# Patient Record
Sex: Male | Born: 1945 | Race: Black or African American | Hispanic: No | Marital: Single | Smoking: Former smoker
Health system: Southern US, Community
[De-identification: ages and names within clinical notes are randomized; demographics above are authoritative.]

## PROBLEM LIST (undated history)

## (undated) DIAGNOSIS — I10 Essential (primary) hypertension: Secondary | ICD-10-CM

## (undated) DIAGNOSIS — Z803 Family history of malignant neoplasm of breast: Secondary | ICD-10-CM

## (undated) DIAGNOSIS — I509 Heart failure, unspecified: Secondary | ICD-10-CM

## (undated) DIAGNOSIS — Z800 Family history of malignant neoplasm of digestive organs: Secondary | ICD-10-CM

## (undated) DIAGNOSIS — I429 Cardiomyopathy, unspecified: Secondary | ICD-10-CM

## (undated) DIAGNOSIS — E119 Type 2 diabetes mellitus without complications: Secondary | ICD-10-CM

## (undated) DIAGNOSIS — E785 Hyperlipidemia, unspecified: Secondary | ICD-10-CM

## (undated) HISTORY — DX: Essential (primary) hypertension: I10

## (undated) HISTORY — PX: CATARACT EXTRACTION W/ INTRAOCULAR LENS  IMPLANT, BILATERAL: SHX1307

## (undated) HISTORY — DX: Heart failure, unspecified: I50.9

## (undated) HISTORY — DX: Cardiomyopathy, unspecified: I42.9

## (undated) HISTORY — DX: Family history of malignant neoplasm of breast: Z80.3

## (undated) HISTORY — PX: HERNIA REPAIR: SHX51

## (undated) HISTORY — DX: Type 2 diabetes mellitus without complications: E11.9

## (undated) HISTORY — DX: Family history of malignant neoplasm of digestive organs: Z80.0

---

## 2005-10-17 ENCOUNTER — Emergency Department: Payer: Self-pay | Admitting: Emergency Medicine

## 2006-02-02 ENCOUNTER — Emergency Department: Payer: Self-pay | Admitting: Emergency Medicine

## 2014-03-14 ENCOUNTER — Emergency Department: Payer: Self-pay | Admitting: Emergency Medicine

## 2014-03-14 LAB — CBC WITH DIFFERENTIAL/PLATELET
BASOS PCT: 1.3 %
Basophil #: 0.1 10*3/uL (ref 0.0–0.1)
Eosinophil #: 0.1 10*3/uL (ref 0.0–0.7)
Eosinophil %: 2.3 %
HCT: 41.8 % (ref 40.0–52.0)
HGB: 13.2 g/dL (ref 13.0–18.0)
Lymphocyte #: 1.8 10*3/uL (ref 1.0–3.6)
Lymphocyte %: 30.6 %
MCH: 29.9 pg (ref 26.0–34.0)
MCHC: 31.5 g/dL — AB (ref 32.0–36.0)
MCV: 95 fL (ref 80–100)
Monocyte #: 0.6 x10 3/mm (ref 0.2–1.0)
Monocyte %: 9.2 %
NEUTROS PCT: 56.6 %
Neutrophil #: 3.4 10*3/uL (ref 1.4–6.5)
PLATELETS: 219 10*3/uL (ref 150–440)
RBC: 4.39 10*6/uL — ABNORMAL LOW (ref 4.40–5.90)
RDW: 14.8 % — AB (ref 11.5–14.5)
WBC: 6 10*3/uL (ref 3.8–10.6)

## 2014-03-14 LAB — COMPREHENSIVE METABOLIC PANEL
ANION GAP: 6 — AB (ref 7–16)
Albumin: 3 g/dL — ABNORMAL LOW (ref 3.4–5.0)
Alkaline Phosphatase: 115 U/L
BILIRUBIN TOTAL: 0.5 mg/dL (ref 0.2–1.0)
BUN: 10 mg/dL (ref 7–18)
Calcium, Total: 8.4 mg/dL — ABNORMAL LOW (ref 8.5–10.1)
Chloride: 108 mmol/L — ABNORMAL HIGH (ref 98–107)
Co2: 27 mmol/L (ref 21–32)
Creatinine: 0.76 mg/dL (ref 0.60–1.30)
GLUCOSE: 232 mg/dL — AB (ref 65–99)
OSMOLALITY: 288 (ref 275–301)
Potassium: 3.9 mmol/L (ref 3.5–5.1)
SGOT(AST): 47 U/L — ABNORMAL HIGH (ref 15–37)
SGPT (ALT): 79 U/L — ABNORMAL HIGH
Sodium: 141 mmol/L (ref 136–145)
TOTAL PROTEIN: 6.8 g/dL (ref 6.4–8.2)

## 2014-03-14 LAB — PRO B NATRIURETIC PEPTIDE: B-Type Natriuretic Peptide: 932 pg/mL — ABNORMAL HIGH (ref 0–125)

## 2014-03-14 LAB — TROPONIN I

## 2014-03-14 LAB — URIC ACID: URIC ACID: 2.8 mg/dL — AB (ref 3.5–7.2)

## 2014-05-02 DIAGNOSIS — E785 Hyperlipidemia, unspecified: Secondary | ICD-10-CM | POA: Insufficient documentation

## 2014-05-02 DIAGNOSIS — N390 Urinary tract infection, site not specified: Secondary | ICD-10-CM | POA: Insufficient documentation

## 2014-05-02 DIAGNOSIS — R0602 Shortness of breath: Secondary | ICD-10-CM | POA: Insufficient documentation

## 2014-05-02 DIAGNOSIS — E119 Type 2 diabetes mellitus without complications: Secondary | ICD-10-CM | POA: Insufficient documentation

## 2014-05-02 DIAGNOSIS — I10 Essential (primary) hypertension: Secondary | ICD-10-CM | POA: Insufficient documentation

## 2014-09-20 DIAGNOSIS — I5043 Acute on chronic combined systolic (congestive) and diastolic (congestive) heart failure: Secondary | ICD-10-CM | POA: Insufficient documentation

## 2014-09-20 DIAGNOSIS — R7401 Elevation of levels of liver transaminase levels: Secondary | ICD-10-CM | POA: Insufficient documentation

## 2014-11-03 DIAGNOSIS — R600 Localized edema: Secondary | ICD-10-CM | POA: Insufficient documentation

## 2014-11-20 ENCOUNTER — Encounter: Payer: Self-pay | Admitting: Family

## 2014-11-20 ENCOUNTER — Ambulatory Visit: Payer: Medicare Other | Attending: Family | Admitting: Family

## 2014-11-20 VITALS — BP 137/99 | HR 82 | Resp 20 | Ht 66.0 in | Wt 207.0 lb

## 2014-11-20 DIAGNOSIS — Z720 Tobacco use: Secondary | ICD-10-CM

## 2014-11-20 DIAGNOSIS — I509 Heart failure, unspecified: Secondary | ICD-10-CM | POA: Insufficient documentation

## 2014-11-20 DIAGNOSIS — I10 Essential (primary) hypertension: Secondary | ICD-10-CM | POA: Diagnosis not present

## 2014-11-20 DIAGNOSIS — E119 Type 2 diabetes mellitus without complications: Secondary | ICD-10-CM | POA: Diagnosis not present

## 2014-11-20 DIAGNOSIS — I429 Cardiomyopathy, unspecified: Secondary | ICD-10-CM | POA: Insufficient documentation

## 2014-11-20 DIAGNOSIS — Z79899 Other long term (current) drug therapy: Secondary | ICD-10-CM | POA: Diagnosis not present

## 2014-11-20 DIAGNOSIS — I5022 Chronic systolic (congestive) heart failure: Secondary | ICD-10-CM | POA: Insufficient documentation

## 2014-11-20 DIAGNOSIS — F17210 Nicotine dependence, cigarettes, uncomplicated: Secondary | ICD-10-CM | POA: Insufficient documentation

## 2014-11-20 DIAGNOSIS — Z7982 Long term (current) use of aspirin: Secondary | ICD-10-CM | POA: Diagnosis not present

## 2014-11-20 NOTE — Progress Notes (Signed)
Subjective:    Patient ID: Matthew Mcgrath, male    DOB: May 20, 1945, 69 y.o.   MRN: [id number]  Congestive Heart Failure Presents for initial visit. The disease course has been stable. Associated symptoms include edema, fatigue, orthopnea (3 pillows) and shortness of breath. Pertinent negatives include no abdominal pain, chest pain, chest pressure, palpitations or paroxysmal nocturnal dyspnea. The symptoms have been stable. Past treatments include beta blockers, ACE inhibitors and salt and fluid restriction. The treatment provided moderate relief. Compliance with prior treatments has been good. His past medical history is significant for DM and HTN. There is no history of CVA. He has one 1st degree relative with heart disease. Compliance with total regimen is 76-100%.  Hypertension This is a chronic problem. The current episode started more than 1 year ago. The problem is unchanged. The problem is controlled. Associated symptoms include malaise/fatigue, peripheral edema and shortness of breath. Pertinent negatives include no anxiety, chest pain, headaches, neck pain or palpitations. There are no associated agents to hypertension. Risk factors for coronary artery disease include diabetes mellitus, male gender, sedentary lifestyle, smoking/tobacco exposure and family history. Past treatments include ACE inhibitors, beta blockers and diuretics. The current treatment provides mild improvement. There are no compliance problems.  Hypertensive end-organ damage includes heart failure. There is no history of kidney disease.    Past Medical History  Diagnosis Date  . Congestive heart failure   . Cardiomyopathy   . HTN (hypertension)   . Diabetes mellitus type 2, controlled     Past Surgical History  Procedure Laterality Date  . Hernia repair      Family History  Problem Relation Age of Onset  . Heart failure Mother   . Heart disease Mother   . Alcoholism Father   . Hypertension Sister   .  Diabetes Sister   . Hypertension Brother   . Diabetes Brother    Social History  Substance Use Topics  . Smoking status: Current Every Day Smoker -- 3.00 packs/day for 15 years    Types: Cigarettes  . Smokeless tobacco: Not on file  . Alcohol Use: No    No Known Allergies  Prior to Admission medications   Medication Sig Start Date End Date Taking? Authorizing Provider  aspirin 81 MG tablet Take 81 mg by mouth daily.   Yes Historical Provider, MD  carvedilol (COREG) 12.5 MG tablet Take 12.5 mg by mouth 2 (two) times daily with a meal.    Yes Historical Provider, MD  furosemide (LASIX) 40 MG tablet Take 40 mg by mouth 2 (two) times daily.   Yes Historical Provider, MD  glipiZIDE (GLUCOTROL) 5 MG tablet Take by mouth daily before breakfast.   Yes Historical Provider, MD  lisinopril (PRINIVIL,ZESTRIL) 20 MG tablet Take 20 mg by mouth daily.   Yes Historical Provider, MD  metFORMIN (GLUCOPHAGE) 500 MG tablet Take by mouth 2 (two) times daily with a meal.   Yes Historical Provider, MD  potassium chloride (K-DUR,KLOR-CON) 10 MEQ tablet Take 10 mEq by mouth daily.   Yes Historical Provider, MD  simvastatin (ZOCOR) 40 MG tablet Take 40 mg by mouth daily.   Yes Historical Provider, MD     Review of Systems  Constitutional: Positive for malaise/fatigue and fatigue. Negative for appetite change.  HENT: Negative for congestion, postnasal drip and sore throat.   Eyes: Negative.   Respiratory: Positive for cough (sometimes) and shortness of breath. Negative for chest tightness.   Cardiovascular: Positive for leg swelling. Negative for  chest pain and palpitations.  Gastrointestinal: Negative for abdominal pain and abdominal distention.  Endocrine: Negative.   Genitourinary: Negative.   Musculoskeletal: Negative for back pain and neck pain.  Skin: Negative.   Allergic/Immunologic: Negative.   Neurological: Negative for dizziness, weakness, light-headedness, numbness and headaches.   Hematological: Negative.   Psychiatric/Behavioral: Positive for sleep disturbance (trouble falling asleep; sleeping on 3 pillows). Negative for dysphoric mood. The patient is not nervous/anxious.        Objective:   Physical Exam  Constitutional: He is oriented to person, place, and time. He appears well-developed and well-nourished.  HENT:  Head: Normocephalic and atraumatic.  Eyes: Conjunctivae are normal. Pupils are equal, round, and reactive to light.  Neck: Normal range of motion. Neck supple.  Cardiovascular: Normal rate and regular rhythm.   Pulmonary/Chest: Effort normal. He has no wheezes. He has no rales.  Abdominal: Soft. He exhibits distension. There is no tenderness.  Musculoskeletal: He exhibits edema (1+ pitting edema in bilateral lower legs). He exhibits no tenderness.  Neurological: He is alert and oriented to person, place, and time.  Skin: Skin is warm and dry.  Psychiatric: He has a normal mood and affect. His behavior is normal. Thought content normal.  Nursing note and vitals reviewed.   BP 137/99 mmHg  Pulse 82  Resp 20  Ht 5\' 6"  (1.676 m)  Wt 207 lb (93.895 kg)  BMI 33.43 kg/m2  SpO2 100%       Assessment & Plan:  1: Chronic heart failure with reduced ejection fraction- Patient presents with fatigue and shortness of breath upon exertion. Also has some swelling in both lower legs although family says that the swelling looks better than previously. When he does get tired or short of breath, he will stop what he's doing to rest until his symptoms resolve. He is already weighing himself daily and reports a stable weight. Instructed to call for an overnight weight gain of >2 pounds or a weekly weight gain of >5 pounds. He does not add salt to his food nor does he cook with it. Discussed the importance of reading food labels and following a 2000mg  sodium diet. Written information regarding a low sodium diet was given to him. He does not elevate his legs much at  home and he was encouraged to elevate them as much as possible when he's home sitting down. [place] pharmacist came in and spoke to patient and family about his medications. Discussed possibly changing his lisinopril to entresto, adding bidil and possibly switching his diuretic to torsemide.  2: HTN- Blood pressure elevated slightly here. Will continue to monitor. 3: Tobacco use- Patient smokes 3-4 ppd of cigarettes and has for many years. Really isn't interested in cessation but it was discussed for 3 minutes. Will continue to address. 4: Diabetes- Currently taking metformin on a daily basis. Unsure of what his glucose is as he doesn't have a machine but says that his doctor tells him it's "good". Encouraged them to speak with his PCP about writing a prescription for a meter and supplies.  Return in 1 month or sooner for any questions/problems before then.

## 2014-11-20 NOTE — Patient Instructions (Signed)
Continue weighing daily and call for an overnight weight gain of > 2 pounds or a weekly weight gain of >5 pounds.    Smoking Cessation Quitting smoking is important to your health and has many advantages. However, it is not always easy to quit since nicotine is a very addictive drug. Oftentimes, people try 3 times or more before being able to quit. This document explains the best ways for you to prepare to quit smoking. Quitting takes hard work and a lot of effort, but you can do it. ADVANTAGES OF QUITTING SMOKING  You will live longer, feel better, and live better.  Your body will feel the impact of quitting smoking almost immediately.  Within 20 minutes, blood pressure decreases. Your pulse returns to its normal level.  After 8 hours, carbon monoxide levels in the blood return to normal. Your oxygen level increases.  After 24 hours, the chance of having a heart attack starts to decrease. Your breath, hair, and body stop smelling like smoke.  After 48 hours, damaged nerve endings begin to recover. Your sense of taste and smell improve.  After 72 hours, the body is virtually free of nicotine. Your bronchial tubes relax and breathing becomes easier.  After 2 to 12 weeks, lungs can hold more air. Exercise becomes easier and circulation improves.  The risk of having a heart attack, stroke, cancer, or lung disease is greatly reduced.  After 1 year, the risk of coronary heart disease is cut in half.  After 5 years, the risk of stroke falls to the same as a nonsmoker.  After 10 years, the risk of lung cancer is cut in half and the risk of other cancers decreases significantly.  After 15 years, the risk of coronary heart disease drops, usually to the level of a nonsmoker.  If you are pregnant, quitting smoking will improve your chances of having a healthy baby.  The people you live with, especially any children, will be healthier.  You will have extra money to spend on things other  than cigarettes. QUESTIONS TO THINK ABOUT BEFORE ATTEMPTING TO QUIT You may want to talk about your answers with your health care provider.  Why do you want to quit?  If you tried to quit in the past, what helped and what did not?  What will be the most difficult situations for you after you quit? How will you plan to handle them?  Who can help you through the tough times? Your family? Friends? A health care provider?  What pleasures do you get from smoking? What ways can you still get pleasure if you quit? Here are some questions to ask your health care provider:  How can you help me to be successful at quitting?  What medicine do you think would be best for me and how should I take it?  What should I do if I need more help?  What is smoking withdrawal like? How can I get information on withdrawal? GET READY  Set a quit date.  Change your environment by getting rid of all cigarettes, ashtrays, matches, and lighters in your home, car, or work. Do not let people smoke in your home.  Review your past attempts to quit. Think about what worked and what did not. GET SUPPORT AND ENCOURAGEMENT You have a better chance of being successful if you have help. You can get support in many ways.  Tell your family, friends, and coworkers that you are going to quit and need their support. Ask   them not to smoke around you.  Get individual, group, or telephone counseling and support. Programs are available at local hospitals and health centers. Call your local health department for information about programs in your area.  Spiritual beliefs and practices may help some smokers quit.  Download a "quit meter" on your computer to keep track of quit statistics, such as how long you have gone without smoking, cigarettes not smoked, and money saved.  Get a self-help book about quitting smoking and staying off tobacco. LEARN NEW SKILLS AND BEHAVIORS  Distract yourself from urges to smoke. Talk to  someone, go for a walk, or occupy your time with a task.  Change your normal routine. Take a different route to work. Drink tea instead of coffee. Eat breakfast in a different place.  Reduce your stress. Take a hot bath, exercise, or read a book.  Plan something enjoyable to do every day. Reward yourself for not smoking.  Explore interactive web-based programs that specialize in helping you quit. GET MEDICINE AND USE IT CORRECTLY Medicines can help you stop smoking and decrease the urge to smoke. Combining medicine with the above behavioral methods and support can greatly increase your chances of successfully quitting smoking.  Nicotine replacement therapy helps deliver nicotine to your body without the negative effects and risks of smoking. Nicotine replacement therapy includes nicotine gum, lozenges, inhalers, nasal sprays, and skin patches. Some may be available over-the-counter and others require a prescription.  Antidepressant medicine helps people abstain from smoking, but how this works is unknown. This medicine is available by prescription.  Nicotinic receptor partial agonist medicine simulates the effect of nicotine in your brain. This medicine is available by prescription. Ask your health care provider for advice about which medicines to use and how to use them based on your health history. Your health care provider will tell you what side effects to look out for if you choose to be on a medicine or therapy. Carefully read the information on the package. Do not use any other product containing nicotine while using a nicotine replacement product.  RELAPSE OR DIFFICULT SITUATIONS Most relapses occur within the first 3 months after quitting. Do not be discouraged if you start smoking again. Remember, most people try several times before finally quitting. You may have symptoms of withdrawal because your body is used to nicotine. You may crave cigarettes, be irritable, feel very hungry, cough  often, get headaches, or have difficulty concentrating. The withdrawal symptoms are only temporary. They are strongest when you first quit, but they will go away within 10-14 days. To reduce the chances of relapse, try to:  Avoid drinking alcohol. Drinking lowers your chances of successfully quitting.  Reduce the amount of caffeine you consume. Once you quit smoking, the amount of caffeine in your body increases and can give you symptoms, such as a rapid heartbeat, sweating, and anxiety.  Avoid smokers because they can make you want to smoke.  Do not let weight gain distract you. Many smokers will gain weight when they quit, usually less than 10 pounds. Eat a healthy diet and stay active. You can always lose the weight gained after you quit.  Find ways to improve your mood other than smoking. FOR MORE INFORMATION  www.smokefree.gov  Document Released: 02/15/2001 Document Revised: 07/08/2013 Document Reviewed: 06/02/2011 Exit[employer] Patient Information 2015 [employer]. This information is not intended to replace advice given to you by your health care provider. Make sure you discuss any questions you have with your   health care provider.  

## 2014-11-21 ENCOUNTER — Encounter: Payer: Self-pay | Admitting: Family

## 2014-11-21 DIAGNOSIS — E119 Type 2 diabetes mellitus without complications: Secondary | ICD-10-CM | POA: Insufficient documentation

## 2014-12-11 ENCOUNTER — Other Ambulatory Visit: Payer: Self-pay | Admitting: Geriatric Medicine

## 2014-12-11 DIAGNOSIS — R748 Abnormal levels of other serum enzymes: Secondary | ICD-10-CM

## 2014-12-22 ENCOUNTER — Encounter: Payer: Self-pay | Admitting: Family

## 2014-12-22 ENCOUNTER — Ambulatory Visit: Payer: Medicare Other | Attending: Family | Admitting: Family

## 2014-12-22 VITALS — BP 128/87 | HR 76 | Resp 20 | Ht 66.0 in | Wt 211.0 lb

## 2014-12-22 DIAGNOSIS — E119 Type 2 diabetes mellitus without complications: Secondary | ICD-10-CM | POA: Diagnosis not present

## 2014-12-22 DIAGNOSIS — I10 Essential (primary) hypertension: Secondary | ICD-10-CM | POA: Diagnosis not present

## 2014-12-22 DIAGNOSIS — F17210 Nicotine dependence, cigarettes, uncomplicated: Secondary | ICD-10-CM | POA: Insufficient documentation

## 2014-12-22 DIAGNOSIS — R0602 Shortness of breath: Secondary | ICD-10-CM | POA: Insufficient documentation

## 2014-12-22 DIAGNOSIS — Z7984 Long term (current) use of oral hypoglycemic drugs: Secondary | ICD-10-CM | POA: Insufficient documentation

## 2014-12-22 DIAGNOSIS — I5022 Chronic systolic (congestive) heart failure: Secondary | ICD-10-CM

## 2014-12-22 DIAGNOSIS — R0789 Other chest pain: Secondary | ICD-10-CM

## 2014-12-22 DIAGNOSIS — R079 Chest pain, unspecified: Secondary | ICD-10-CM | POA: Diagnosis not present

## 2014-12-22 DIAGNOSIS — Z7982 Long term (current) use of aspirin: Secondary | ICD-10-CM | POA: Insufficient documentation

## 2014-12-22 DIAGNOSIS — Z79899 Other long term (current) drug therapy: Secondary | ICD-10-CM | POA: Diagnosis not present

## 2014-12-22 DIAGNOSIS — Z720 Tobacco use: Secondary | ICD-10-CM

## 2014-12-22 NOTE — Patient Instructions (Signed)
Continue weighing daily and call for an overnight weight gain of > 2 pounds or a weekly weight gain of >5 pounds.    Smoking Cessation Quitting smoking is important to your health and has many advantages. However, it is not always easy to quit since nicotine is a very addictive drug. Oftentimes, people try 3 times or more before being able to quit. This document explains the best ways for you to prepare to quit smoking. Quitting takes hard work and a lot of effort, but you can do it. ADVANTAGES OF QUITTING SMOKING  You will live longer, feel better, and live better.  Your body will feel the impact of quitting smoking almost immediately.  Within 20 minutes, blood pressure decreases. Your pulse returns to its normal level.  After 8 hours, carbon monoxide levels in the blood return to normal. Your oxygen level increases.  After 24 hours, the chance of having a heart attack starts to decrease. Your breath, hair, and body stop smelling like smoke.  After 48 hours, damaged nerve endings begin to recover. Your sense of taste and smell improve.  After 72 hours, the body is virtually free of nicotine. Your bronchial tubes relax and breathing becomes easier.  After 2 to 12 weeks, lungs can hold more air. Exercise becomes easier and circulation improves.  The risk of having a heart attack, stroke, cancer, or lung disease is greatly reduced.  After 1 year, the risk of coronary heart disease is cut in half.  After 5 years, the risk of stroke falls to the same as a nonsmoker.  After 10 years, the risk of lung cancer is cut in half and the risk of other cancers decreases significantly.  After 15 years, the risk of coronary heart disease drops, usually to the level of a nonsmoker.  If you are pregnant, quitting smoking will improve your chances of having a healthy baby.  The people you live with, especially any children, will be healthier.  You will have extra money to spend on things other  than cigarettes. QUESTIONS TO THINK ABOUT BEFORE ATTEMPTING TO QUIT You may want to talk about your answers with your health care provider.  Why do you want to quit?  If you tried to quit in the past, what helped and what did not?  What will be the most difficult situations for you after you quit? How will you plan to handle them?  Who can help you through the tough times? Your family? Friends? A health care provider?  What pleasures do you get from smoking? What ways can you still get pleasure if you quit? Here are some questions to ask your health care provider:  How can you help me to be successful at quitting?  What medicine do you think would be best for me and how should I take it?  What should I do if I need more help?  What is smoking withdrawal like? How can I get information on withdrawal? GET READY  Set a quit date.  Change your environment by getting rid of all cigarettes, ashtrays, matches, and lighters in your home, car, or work. Do not let people smoke in your home.  Review your past attempts to quit. Think about what worked and what did not. GET SUPPORT AND ENCOURAGEMENT You have a better chance of being successful if you have help. You can get support in many ways.  Tell your family, friends, and coworkers that you are going to quit and need their support. Ask   them not to smoke around you.  Get individual, group, or telephone counseling and support. Programs are available at local hospitals and health centers. Call your local health department for information about programs in your area.  Spiritual beliefs and practices may help some smokers quit.  Download a "quit meter" on your computer to keep track of quit statistics, such as how long you have gone without smoking, cigarettes not smoked, and money saved.  Get a self-help book about quitting smoking and staying off tobacco. LEARN NEW SKILLS AND BEHAVIORS  Distract yourself from urges to smoke. Talk to  someone, go for a walk, or occupy your time with a task.  Change your normal routine. Take a different route to work. Drink tea instead of coffee. Eat breakfast in a different place.  Reduce your stress. Take a hot bath, exercise, or read a book.  Plan something enjoyable to do every day. Reward yourself for not smoking.  Explore interactive web-based programs that specialize in helping you quit. GET MEDICINE AND USE IT CORRECTLY Medicines can help you stop smoking and decrease the urge to smoke. Combining medicine with the above behavioral methods and support can greatly increase your chances of successfully quitting smoking.  Nicotine replacement therapy helps deliver nicotine to your body without the negative effects and risks of smoking. Nicotine replacement therapy includes nicotine gum, lozenges, inhalers, nasal sprays, and skin patches. Some may be available over-the-counter and others require a prescription.  Antidepressant medicine helps people abstain from smoking, but how this works is unknown. This medicine is available by prescription.  Nicotinic receptor partial agonist medicine simulates the effect of nicotine in your brain. This medicine is available by prescription. Ask your health care provider for advice about which medicines to use and how to use them based on your health history. Your health care provider will tell you what side effects to look out for if you choose to be on a medicine or therapy. Carefully read the information on the package. Do not use any other product containing nicotine while using a nicotine replacement product.  RELAPSE OR DIFFICULT SITUATIONS Most relapses occur within the first 3 months after quitting. Do not be discouraged if you start smoking again. Remember, most people try several times before finally quitting. You may have symptoms of withdrawal because your body is used to nicotine. You may crave cigarettes, be irritable, feel very hungry, cough  often, get headaches, or have difficulty concentrating. The withdrawal symptoms are only temporary. They are strongest when you first quit, but they will go away within 10-14 days. To reduce the chances of relapse, try to:  Avoid drinking alcohol. Drinking lowers your chances of successfully quitting.  Reduce the amount of caffeine you consume. Once you quit smoking, the amount of caffeine in your body increases and can give you symptoms, such as a rapid heartbeat, sweating, and anxiety.  Avoid smokers because they can make you want to smoke.  Do not let weight gain distract you. Many smokers will gain weight when they quit, usually less than 10 pounds. Eat a healthy diet and stay active. You can always lose the weight gained after you quit.  Find ways to improve your mood other than smoking. FOR MORE INFORMATION  www.smokefree.gov  Document Released: 02/15/2001 Document Revised: 07/08/2013 Document Reviewed: 06/02/2011 Exit[employer] Patient Information 2015 [employer]. This information is not intended to replace advice given to you by your health care provider. Make sure you discuss any questions you have with your   health care provider.  

## 2014-12-22 NOTE — Progress Notes (Signed)
Subjective:    Patient ID: Matthew Mcgrath, male    DOB: 01-29-1946, 69 y.o.   MRN: [id number]  Congestive Heart Failure Presents for follow-up visit. The disease course has been stable. Associated symptoms include chest pressure, edema, fatigue, orthopnea, palpitations and shortness of breath (at times). Pertinent negatives include no abdominal pain, chest pain or paroxysmal nocturnal dyspnea. The symptoms have been stable. Past treatments include beta blockers, salt and fluid restriction and ACE inhibitors. The treatment provided moderate relief. Compliance with prior treatments has been variable. Prior compliance problems include difficulty understanding directions. His past medical history is significant for DM and HTN. He has one 1st degree relative with heart disease. Compliance with total regimen is 76-100%.  Chest Pain  This is a recurrent problem. The current episode started more than 1 month ago. The onset quality is gradual. The problem occurs intermittently. The pain is present in the substernal region. The pain is at a severity of 2/10. The pain is mild. The quality of the pain is described as dull. The pain does not radiate. Associated symptoms include a cough (sometimes), palpitations, shortness of breath (at times) and weakness (in legs). Pertinent negatives include no abdominal pain, back pain, dizziness or headaches. The pain is aggravated by nothing. He has tried rest for the symptoms. The treatment provided significant relief. Risk factors include male gender and smoking/tobacco exposure.  His past medical history is significant for CHF, diabetes, DM, HTN and hypertension.  His family medical history is significant for diabetes, heart disease and hypertension.    Past Medical History  Diagnosis Date  . Congestive heart failure ([place])   . Cardiomyopathy ([place])   . HTN (hypertension)   . Diabetes mellitus type 2, controlled [facility])     Past Surgical History  Procedure Laterality  Date  . Hernia repair      Family History  Problem Relation Age of Onset  . Heart failure Mother   . Heart disease Mother   . Alcoholism Father   . Hypertension Sister   . Diabetes Sister   . Hypertension Brother   . Diabetes Brother     Social History  Substance Use Topics  . Smoking status: Current Every Day Smoker -- 3.00 packs/day for 15 years    Types: Cigarettes  . Smokeless tobacco: Never Used  . Alcohol Use: No    No Known Allergies  Prior to Admission medications   Medication Sig Start Date End Date Taking? Authorizing Provider  aspirin 81 MG tablet Take 81 mg by mouth daily.   Yes Historical Provider, MD  carvedilol (COREG) 12.5 MG tablet Take 25 mg by mouth 2 (two) times daily with a meal.    Yes Historical Provider, MD  furosemide (LASIX) 40 MG tablet Take 40 mg by mouth 2 (two) times daily.   Yes Historical Provider, MD  glipiZIDE (GLUCOTROL) 5 MG tablet Take by mouth daily before breakfast.   Yes Historical Provider, MD  lisinopril (PRINIVIL,ZESTRIL) 20 MG tablet Take 20 mg by mouth daily.   Yes Historical Provider, MD  metFORMIN (GLUCOPHAGE) 500 MG tablet Take by mouth 2 (two) times daily with a meal.   Yes Historical Provider, MD  potassium chloride (K-DUR,KLOR-CON) 10 MEQ tablet Take 10 mEq by mouth daily.   Yes Historical Provider, MD  simvastatin (ZOCOR) 40 MG tablet Take 40 mg by mouth daily.   Yes Historical Provider, MD     Review of Systems  Constitutional: Positive for fatigue. Negative for appetite change.  HENT: Negative for congestion, postnasal drip and sore throat.   Eyes: Negative.   Respiratory: Positive for cough (sometimes), chest tightness (lower sternal area last night) and shortness of breath (at times).   Cardiovascular: Positive for palpitations and leg swelling. Negative for chest pain.  Gastrointestinal: Positive for abdominal distention. Negative for abdominal pain.  Endocrine: Negative.   Genitourinary: Negative.    Musculoskeletal: Negative for back pain and neck pain.  Skin: Negative.   Allergic/Immunologic: Negative.   Neurological: Positive for weakness (in legs). Negative for dizziness, light-headedness and headaches.  Hematological: Negative for adenopathy. Does not bruise/bleed easily.  Psychiatric/Behavioral: Positive for sleep disturbance (sleeping on 3 pillows; sleeping more during the day). Negative for dysphoric mood. The patient is not nervous/anxious.        Objective:   Physical Exam  Constitutional: He is oriented to person, place, and time. He appears well-developed and well-nourished.  HENT:  Head: Normocephalic and atraumatic.  Eyes: Conjunctivae are normal. Pupils are equal, round, and reactive to light.  Neck: Normal range of motion. Neck supple.  Cardiovascular: Normal rate and regular rhythm.   Pulmonary/Chest: Effort normal. He has no wheezes. He has no rales.  Abdominal: Soft. He exhibits distension. There is no tenderness.  Musculoskeletal: He exhibits edema (1+ bilateral lower legs). He exhibits no tenderness.  Neurological: He is alert and oriented to person, place, and time.  Skin: Skin is warm and dry.  Psychiatric: He has a normal mood and affect. His behavior is normal. Thought content normal.  Nursing note and vitals reviewed.  BP 128/87 mmHg  Pulse 76  Resp 20  Ht 5\' 6"  (1.676 m)  Wt 211 lb (95.709 kg)  BMI 34.07 kg/m2  SpO2 100%        Assessment & Plan:  1: Chronic heart failure with reduced ejection fraction- Patient presents with shortness of breath at times on exertion. He does get tired upon exertion as well. He continues to have swelling in his lower legs as well as some swelling in his abdomen. He admits that he doesn't elevate his legs much at home and he was encouraged to elevate them as much as possible when he's home. He continues to weigh himself and reports a stable weight. By our scale, he's gained 4 pounds since he was last here on  11/20/14. Is not adding any salt to his food. As of December 06, 2014, he is part of the [facility] program so he will no longer be coming to the [facility].  2: HTN- Blood pressure looks good today. Continue medications at this time. 3: Diabetes- He is unsure of what his glucose is. Hopefully now that he's with [facility], he can get a glucometer. 4: Chest pain- Patient says that he had an episode of a dull ache in his lower sternum, across his upper abdomen. No radiation and it resolved on its' own after a few minutes. Is scheduled for an abdominal ultrasound on 01/15/15. Encouraged him to call his [facility] provider if this reoccurs. 5: Tobacco use- Patient says that he's now smoking 1 pack of cigarettes every 3-4 days. Discussed complete cessation for 3 minutes with him.   Again, he is now part of [facility] so will be followed completely by them. He can return as needed or if the [facility] provider needs [place] to see him.

## 2014-12-23 DIAGNOSIS — R079 Chest pain, unspecified: Secondary | ICD-10-CM | POA: Insufficient documentation

## 2014-12-24 ENCOUNTER — Other Ambulatory Visit: Payer: Self-pay | Admitting: Family Medicine

## 2014-12-24 DIAGNOSIS — R748 Abnormal levels of other serum enzymes: Secondary | ICD-10-CM

## 2015-01-15 ENCOUNTER — Ambulatory Visit: Payer: Medicare Other

## 2015-01-15 ENCOUNTER — Ambulatory Visit
Admission: RE | Admit: 2015-01-15 | Discharge: 2015-01-15 | Disposition: A | Discharge status: Home / Self Care | Payer: Medicare (Managed Care) | Source: Ambulatory Visit | Attending: Family Medicine | Admitting: Family Medicine

## 2015-01-15 DIAGNOSIS — R748 Abnormal levels of other serum enzymes: Secondary | ICD-10-CM

## 2015-02-06 ENCOUNTER — Ambulatory Visit
Admission: RE | Admit: 2015-02-06 | Discharge: 2015-02-06 | Disposition: A | Discharge status: Home / Self Care | Payer: Medicare (Managed Care) | Source: Ambulatory Visit | Attending: Family Medicine | Admitting: Family Medicine

## 2015-02-06 DIAGNOSIS — R748 Abnormal levels of other serum enzymes: Secondary | ICD-10-CM | POA: Insufficient documentation

## 2015-02-06 DIAGNOSIS — I77811 Abdominal aortic ectasia: Secondary | ICD-10-CM | POA: Insufficient documentation

## 2015-02-06 DIAGNOSIS — K819 Cholecystitis, unspecified: Secondary | ICD-10-CM | POA: Insufficient documentation

## 2015-02-06 DIAGNOSIS — E8809 Other disorders of plasma-protein metabolism, not elsewhere classified: Secondary | ICD-10-CM | POA: Diagnosis not present

## 2015-02-19 ENCOUNTER — Other Ambulatory Visit: Payer: Self-pay | Admitting: Geriatric Medicine

## 2015-02-19 DIAGNOSIS — I501 Left ventricular failure: Secondary | ICD-10-CM

## 2015-03-30 ENCOUNTER — Ambulatory Visit: Payer: Medicare (Managed Care)

## 2015-04-14 ENCOUNTER — Encounter
Admission: RE | Disposition: A | Discharge status: Home / Self Care | Payer: Self-pay | Source: Ambulatory Visit | Attending: Cardiology

## 2015-04-14 ENCOUNTER — Encounter: Payer: Self-pay | Admitting: *Deleted

## 2015-04-14 ENCOUNTER — Ambulatory Visit
Admission: RE | Admit: 2015-04-14 | Discharge: 2015-04-14 | Disposition: A | Discharge status: Home / Self Care | Payer: Medicare (Managed Care) | Source: Ambulatory Visit | Attending: Cardiology | Admitting: Cardiology

## 2015-04-14 DIAGNOSIS — I2510 Atherosclerotic heart disease of native coronary artery without angina pectoris: Secondary | ICD-10-CM | POA: Insufficient documentation

## 2015-04-14 DIAGNOSIS — E119 Type 2 diabetes mellitus without complications: Secondary | ICD-10-CM | POA: Diagnosis not present

## 2015-04-14 DIAGNOSIS — Z79899 Other long term (current) drug therapy: Secondary | ICD-10-CM | POA: Diagnosis not present

## 2015-04-14 DIAGNOSIS — I420 Dilated cardiomyopathy: Secondary | ICD-10-CM | POA: Diagnosis not present

## 2015-04-14 DIAGNOSIS — Z7984 Long term (current) use of oral hypoglycemic drugs: Secondary | ICD-10-CM | POA: Insufficient documentation

## 2015-04-14 DIAGNOSIS — R079 Chest pain, unspecified: Secondary | ICD-10-CM | POA: Diagnosis present

## 2015-04-14 DIAGNOSIS — I5022 Chronic systolic (congestive) heart failure: Secondary | ICD-10-CM | POA: Insufficient documentation

## 2015-04-14 DIAGNOSIS — Z7982 Long term (current) use of aspirin: Secondary | ICD-10-CM | POA: Diagnosis not present

## 2015-04-14 DIAGNOSIS — R9430 Abnormal result of cardiovascular function study, unspecified: Secondary | ICD-10-CM | POA: Diagnosis present

## 2015-04-14 DIAGNOSIS — I10 Essential (primary) hypertension: Secondary | ICD-10-CM | POA: Diagnosis not present

## 2015-04-14 HISTORY — PX: CARDIAC CATHETERIZATION: SHX172

## 2015-04-14 SURGERY — LEFT HEART CATH AND CORONARY ANGIOGRAPHY
Anesthesia: Moderate Sedation

## 2015-04-14 MED ORDER — FENTANYL CITRATE (PF) 100 MCG/2ML IJ SOLN
INTRAMUSCULAR | Status: AC
  Filled 2015-04-14: qty 2

## 2015-04-14 MED ORDER — HEPARIN (PORCINE) IN NACL 2-0.9 UNIT/ML-% IJ SOLN
INTRAMUSCULAR | Status: AC
  Filled 2015-04-14: qty 500

## 2015-04-14 MED ORDER — FENTANYL CITRATE (PF) 100 MCG/2ML IJ SOLN
INTRAMUSCULAR | Status: DC | PRN
  Administered 2015-04-14: 50 ug via INTRAVENOUS
  Administered 2015-04-14: 25 ug via INTRAVENOUS

## 2015-04-14 MED ORDER — MIDAZOLAM HCL 2 MG/2ML IJ SOLN
INTRAMUSCULAR | Status: AC
  Filled 2015-04-14: qty 2

## 2015-04-14 MED ORDER — METOPROLOL TARTRATE 1 MG/ML IV SOLN
INTRAVENOUS | Status: DC | PRN
  Administered 2015-04-14 (×2): 2.5 mg via INTRAVENOUS

## 2015-04-14 MED ORDER — METOPROLOL TARTRATE 1 MG/ML IV SOLN
INTRAVENOUS | Status: AC
  Filled 2015-04-14: qty 5

## 2015-04-14 MED ORDER — MIDAZOLAM HCL 2 MG/2ML IJ SOLN
INTRAMUSCULAR | Status: DC | PRN
  Administered 2015-04-14: 1 mg via INTRAVENOUS
  Administered 2015-04-14: 0.5 mg via INTRAVENOUS

## 2015-04-14 MED ORDER — IOHEXOL 300 MG/ML  SOLN
INTRAMUSCULAR | Status: DC | PRN
  Administered 2015-04-14: 95 mL via INTRA_ARTERIAL

## 2015-04-14 SURGICAL SUPPLY — 10 items
CATH INFINITI 5FR ANG PIGTAIL (CATHETERS) ×3 IMPLANT
CATH INFINITI 5FR JL4 (CATHETERS) ×3 IMPLANT
CATH INFINITI 5FR JL5 (CATHETERS) ×3 IMPLANT
CATH INFINITI JR4 5F (CATHETERS) ×3 IMPLANT
KIT MANI 3VAL PERCEP (MISCELLANEOUS) ×3 IMPLANT
NEEDLE PERC 18GX7CM (NEEDLE) ×3 IMPLANT
PACK CARDIAC CATH (CUSTOM PROCEDURE TRAY) ×3 IMPLANT
SHEATH AVANTI 5FR X 11CM (SHEATH) ×3 IMPLANT
WIRE EMERALD 3MM-J .035X150CM (WIRE) ×6 IMPLANT
WIRE EMERALD ST .035X150CM (WIRE) IMPLANT

## 2015-04-24 DIAGNOSIS — Z9889 Other specified postprocedural states: Secondary | ICD-10-CM | POA: Insufficient documentation

## 2015-04-24 DIAGNOSIS — I429 Cardiomyopathy, unspecified: Secondary | ICD-10-CM | POA: Insufficient documentation

## 2015-06-23 ENCOUNTER — Encounter
Admission: RE | Admit: 2015-06-23 | Discharge: 2015-06-23 | Disposition: A | Discharge status: Home / Self Care | Payer: Medicare (Managed Care) | Source: Ambulatory Visit | Attending: Cardiology | Admitting: Cardiology

## 2015-06-23 DIAGNOSIS — I429 Cardiomyopathy, unspecified: Secondary | ICD-10-CM | POA: Insufficient documentation

## 2015-06-23 DIAGNOSIS — Z01812 Encounter for preprocedural laboratory examination: Secondary | ICD-10-CM | POA: Insufficient documentation

## 2015-06-23 HISTORY — DX: Hyperlipidemia, unspecified: E78.5

## 2015-06-23 LAB — BASIC METABOLIC PANEL
ANION GAP: 9 (ref 5–15)
BUN: 19 mg/dL (ref 6–20)
CHLORIDE: 105 mmol/L (ref 101–111)
CO2: 24 mmol/L (ref 22–32)
Calcium: 9.5 mg/dL (ref 8.9–10.3)
Creatinine, Ser: 0.69 mg/dL (ref 0.61–1.24)
GFR calc Af Amer: >60 mL/min (ref >60)
GFR calc non Af Amer: >60 mL/min (ref >60)
GLUCOSE: 183 mg/dL — AB (ref 65–99)
POTASSIUM: 4 mmol/L (ref 3.5–5.1)
Sodium: 138 mmol/L (ref 135–145)

## 2015-06-23 LAB — CBC WITH DIFFERENTIAL/PLATELET
BASOS ABS: 0.1 10*3/uL (ref 0–0.1)
Basophils Relative: 1 %
EOS ABS: 0.2 10*3/uL (ref 0–0.7)
EOS PCT: 4 %
HCT: 39.2 % — ABNORMAL LOW (ref 40.0–52.0)
Hemoglobin: 12.9 g/dL — ABNORMAL LOW (ref 13.0–18.0)
LYMPHS PCT: 28 %
Lymphs Abs: 1.6 10*3/uL (ref 1.0–3.6)
MCH: 31.7 pg (ref 26.0–34.0)
MCHC: 33 g/dL (ref 32.0–36.0)
MCV: 96.1 fL (ref 80.0–100.0)
MONO ABS: 0.5 10*3/uL (ref 0.2–1.0)
Monocytes Relative: 9 %
Neutro Abs: 3.4 10*3/uL (ref 1.4–6.5)
Neutrophils Relative %: 58 %
PLATELETS: 174 10*3/uL (ref 150–440)
RBC: 4.08 MIL/uL — ABNORMAL LOW (ref 4.40–5.90)
RDW: 12.8 % (ref 11.5–14.5)
WBC: 5.7 10*3/uL (ref 3.8–10.6)

## 2015-06-23 LAB — SURGICAL PCR SCREEN
MRSA, PCR: NEGATIVE
Staphylococcus aureus: NEGATIVE

## 2015-06-23 LAB — URINALYSIS COMPLETE WITH MICROSCOPIC (ARMC ONLY)
Bilirubin Urine: NEGATIVE
Glucose, UA: 150 mg/dL — AB
HGB URINE DIPSTICK: NEGATIVE
KETONES UR: NEGATIVE mg/dL
NITRITE: NEGATIVE
PH: 5 (ref 5.0–8.0)
PROTEIN: NEGATIVE mg/dL
SPECIFIC GRAVITY, URINE: 1.018 (ref 1.005–1.030)
Squamous Epithelial / LPF: NONE SEEN

## 2015-06-23 LAB — PROTIME-INR
INR: 1.15
Prothrombin Time: 14.9 seconds (ref 11.4–15.0)

## 2015-06-23 NOTE — Patient Instructions (Addendum)
  Your procedure is scheduled on: 07/03/15 Fri Report to Same Day Surgery 2nd floor medical mall To find out your arrival time please call [phone] between 1PM - 3PM on  07/02/15 Thurs  Remember: Instructions that are not followed completely may result in serious medical risk, up to and including death, or upon the discretion of your surgeon and anesthesiologist your surgery may need to be rescheduled.    _x___ 1. Do not eat food or drink liquids after midnight. No gum chewing or hard candies.     __x__ 2. No Alcohol for 24 hours before or after surgery.   ____ 3. Bring all medications with you on the day of surgery if instructed.    __x__ 4. Notify your doctor if there is any change in your medical condition     (cold, fever, infections).     Do not wear jewelry, make-up, hairpins, clips or nail polish.  Do not wear lotions, powders, or perfumes. You may wear deodorant.  Do not shave 48 hours prior to surgery. Men may shave face and neck.  Do not bring valuables to the hospital.    [facility] is not responsible for any belongings or valuables.               Contacts, dentures or bridgework may not be worn into surgery.  Leave your suitcase in the car. After surgery it may be brought to your room.  For patients admitted to the hospital, discharge time is determined by your treatment team.   Patients discharged the day of surgery will not be allowed to drive home.    Please read over the following fact sheets that you were given:   [facility] Preparing for Surgery and or MRSA Information   _x___ Take these medicines the morning of surgery with A SIP OF WATER:    1. carvedilol (COREG) 12.5 MG tablet  2.lisinopril (PRINIVIL,ZESTRIL) 20 MG tablet  3.  4.  5.  6.  ____ Fleet Enema (as directed)   ____ Use CHG Soap or sage wipes as directed on instruction sheet   ____ Use inhalers on the day of surgery and bring to hospital day of surgery  _x___ Stop metformin 2 days  prior to surgery    ____ Take 1/2 of usual insulin dose the night before surgery and none on the morning of           surgery.   __x__ Stop aspirin or coumadin, or plavix Stop aspirin per Dr Marcello Moores instruction  _x__ Stop Anti-inflammatories such as Advil, Aleve, Ibuprofen, Motrin, Naproxen,          Naprosyn, Goodies powders or aspirin products. Ok to take Tylenol.   ____ Stop supplements until after surgery.    ____ Bring C-Pap to the hospital.

## 2015-07-03 ENCOUNTER — Observation Stay
Admission: RE | Admit: 2015-07-03 | Discharge: 2015-07-04 | Disposition: A | Discharge status: Home / Self Care | Payer: Medicare (Managed Care) | Source: Ambulatory Visit | Attending: Cardiology | Admitting: Cardiology

## 2015-07-03 ENCOUNTER — Inpatient Hospital Stay: Payer: Medicare (Managed Care) | Admitting: Anesthesiology

## 2015-07-03 ENCOUNTER — Encounter
Admission: RE | Disposition: A | Discharge status: Home / Self Care | Payer: Self-pay | Source: Ambulatory Visit | Attending: Cardiology

## 2015-07-03 ENCOUNTER — Inpatient Hospital Stay: Payer: Medicare (Managed Care)

## 2015-07-03 ENCOUNTER — Encounter: Payer: Self-pay | Admitting: Anesthesiology

## 2015-07-03 DIAGNOSIS — Z87440 Personal history of urinary (tract) infections: Secondary | ICD-10-CM | POA: Diagnosis not present

## 2015-07-03 DIAGNOSIS — Z79899 Other long term (current) drug therapy: Secondary | ICD-10-CM | POA: Diagnosis not present

## 2015-07-03 DIAGNOSIS — E784 Other hyperlipidemia: Secondary | ICD-10-CM | POA: Insufficient documentation

## 2015-07-03 DIAGNOSIS — Z7984 Long term (current) use of oral hypoglycemic drugs: Secondary | ICD-10-CM | POA: Insufficient documentation

## 2015-07-03 DIAGNOSIS — I420 Dilated cardiomyopathy: Secondary | ICD-10-CM | POA: Diagnosis present

## 2015-07-03 DIAGNOSIS — F17210 Nicotine dependence, cigarettes, uncomplicated: Secondary | ICD-10-CM | POA: Diagnosis not present

## 2015-07-03 DIAGNOSIS — Z95810 Presence of automatic (implantable) cardiac defibrillator: Secondary | ICD-10-CM

## 2015-07-03 DIAGNOSIS — E119 Type 2 diabetes mellitus without complications: Secondary | ICD-10-CM | POA: Diagnosis not present

## 2015-07-03 DIAGNOSIS — I493 Ventricular premature depolarization: Secondary | ICD-10-CM | POA: Insufficient documentation

## 2015-07-03 DIAGNOSIS — Z7982 Long term (current) use of aspirin: Secondary | ICD-10-CM | POA: Diagnosis not present

## 2015-07-03 DIAGNOSIS — I440 Atrioventricular block, first degree: Secondary | ICD-10-CM | POA: Diagnosis not present

## 2015-07-03 DIAGNOSIS — I10 Essential (primary) hypertension: Secondary | ICD-10-CM | POA: Insufficient documentation

## 2015-07-03 DIAGNOSIS — I739 Peripheral vascular disease, unspecified: Secondary | ICD-10-CM | POA: Diagnosis not present

## 2015-07-03 DIAGNOSIS — Z8249 Family history of ischemic heart disease and other diseases of the circulatory system: Secondary | ICD-10-CM | POA: Insufficient documentation

## 2015-07-03 DIAGNOSIS — I2510 Atherosclerotic heart disease of native coronary artery without angina pectoris: Secondary | ICD-10-CM | POA: Diagnosis not present

## 2015-07-03 DIAGNOSIS — I5022 Chronic systolic (congestive) heart failure: Principal | ICD-10-CM | POA: Diagnosis present

## 2015-07-03 HISTORY — PX: IMPLANTABLE CARDIOVERTER DEFIBRILLATOR (ICD) GENERATOR CHANGE: SHX5469

## 2015-07-03 LAB — URINE DRUG SCREEN, QUALITATIVE (ARMC ONLY)
Amphetamines, Ur Screen: NOT DETECTED
Barbiturates, Ur Screen: NOT DETECTED
Benzodiazepine, Ur Scrn: NOT DETECTED
CANNABINOID 50 NG, UR ~~LOC~~: POSITIVE — AB
COCAINE METABOLITE, UR ~~LOC~~: NOT DETECTED
MDMA (ECSTASY) UR SCREEN: NOT DETECTED
Methadone Scn, Ur: NOT DETECTED
OPIATE, UR SCREEN: NOT DETECTED
PHENCYCLIDINE (PCP) UR S: NOT DETECTED
Tricyclic, Ur Screen: NOT DETECTED

## 2015-07-03 LAB — GLUCOSE, CAPILLARY
GLUCOSE-CAPILLARY: 108 mg/dL — AB (ref 65–99)
Glucose-Capillary: 102 mg/dL — ABNORMAL HIGH (ref 65–99)

## 2015-07-03 SURGERY — ICD GENERATOR CHANGE
Anesthesia: Monitor Anesthesia Care | Laterality: Left | Wound class: Clean

## 2015-07-03 MED ORDER — CEFAZOLIN SODIUM-DEXTROSE 2-4 GM/100ML-% IV SOLN
INTRAVENOUS | Status: AC
  Administered 2015-07-03: 2 g via INTRAVENOUS
  Filled 2015-07-03: qty 200

## 2015-07-03 MED ORDER — FAMOTIDINE 20 MG PO TABS
20.0000 mg | ORAL_TABLET | Freq: Once | ORAL | Status: AC
  Administered 2015-07-03: 20 mg via ORAL

## 2015-07-03 MED ORDER — ASPIRIN EC 81 MG PO TBEC
81.0000 mg | DELAYED_RELEASE_TABLET | Freq: Every day | ORAL | Status: DC
  Administered 2015-07-04: 81 mg via ORAL
  Filled 2015-07-03: qty 1

## 2015-07-03 MED ORDER — FAMOTIDINE 20 MG PO TABS
ORAL_TABLET | ORAL | Status: AC
  Administered 2015-07-03: 20 mg via ORAL
  Filled 2015-07-03: qty 1

## 2015-07-03 MED ORDER — GLIPIZIDE 5 MG PO TABS
5.0000 mg | ORAL_TABLET | Freq: Every day | ORAL | Status: DC
  Administered 2015-07-04: 5 mg via ORAL
  Filled 2015-07-03: qty 1

## 2015-07-03 MED ORDER — SODIUM CHLORIDE 0.9 % IV SOLN
INTRAVENOUS | Status: DC
  Administered 2015-07-03 (×2): via INTRAVENOUS

## 2015-07-03 MED ORDER — FUROSEMIDE 40 MG PO TABS
40.0000 mg | ORAL_TABLET | Freq: Two times a day (BID) | ORAL | Status: DC
  Administered 2015-07-03 – 2015-07-04 (×2): 40 mg via ORAL
  Filled 2015-07-03 (×2): qty 1

## 2015-07-03 MED ORDER — SODIUM CHLORIDE 0.9 % IV SOLN
INTRAVENOUS | Status: DC
  Administered 2015-07-03: 14:00:00 via INTRAVENOUS

## 2015-07-03 MED ORDER — MAGNESIUM OXIDE 400 (241.3 MG) MG PO TABS
400.0000 mg | ORAL_TABLET | Freq: Two times a day (BID) | ORAL | Status: DC
  Administered 2015-07-03 – 2015-07-04 (×2): 400 mg via ORAL
  Filled 2015-07-03 (×2): qty 1

## 2015-07-03 MED ORDER — SODIUM CHLORIDE 0.9 % IR SOLN
Status: DC | PRN
  Administered 2015-07-03: 300 mL

## 2015-07-03 MED ORDER — SPIRONOLACTONE 25 MG PO TABS
25.0000 mg | ORAL_TABLET | Freq: Every day | ORAL | Status: DC
  Administered 2015-07-04: 25 mg via ORAL
  Filled 2015-07-03: qty 1

## 2015-07-03 MED ORDER — SODIUM CHLORIDE 0.9 % IR SOLN
Freq: Once | Status: DC
  Filled 2015-07-03: qty 2

## 2015-07-03 MED ORDER — ACETAMINOPHEN 325 MG PO TABS: 325.0000 mg | ORAL_TABLET | ORAL | Status: DC | PRN

## 2015-07-03 MED ORDER — ONDANSETRON HCL 4 MG/2ML IJ SOLN: 4.0000 mg | Freq: Four times a day (QID) | INTRAMUSCULAR | Status: DC | PRN

## 2015-07-03 MED ORDER — LISINOPRIL 20 MG PO TABS
40.0000 mg | ORAL_TABLET | Freq: Every day | ORAL | Status: DC
  Administered 2015-07-04: 40 mg via ORAL
  Filled 2015-07-03: qty 2

## 2015-07-03 MED ORDER — CEFAZOLIN SODIUM 1-5 GM-% IV SOLN
INTRAVENOUS | Status: AC
  Filled 2015-07-03: qty 50

## 2015-07-03 MED ORDER — LIDOCAINE 1 % OPTIME INJ - NO CHARGE
INTRAMUSCULAR | Status: DC | PRN
Start: 2015-07-03 — End: 2015-07-03
  Administered 2015-07-03: 10 mL

## 2015-07-03 MED ORDER — CEFAZOLIN SODIUM-DEXTROSE 2-4 GM/100ML-% IV SOLN
2.0000 g | Freq: Once | INTRAVENOUS | Status: AC
  Administered 2015-07-03: 2 g via INTRAVENOUS

## 2015-07-03 MED ORDER — LIDOCAINE HCL (CARDIAC) 20 MG/ML IV SOLN
INTRAVENOUS | Status: DC | PRN
  Administered 2015-07-03: 80 mg via INTRAVENOUS

## 2015-07-03 MED ORDER — SODIUM CHLORIDE 0.9 % IV SOLN
INTRAVENOUS | Status: DC | PRN
  Administered 2015-07-03: 20 mL via INTRAMUSCULAR

## 2015-07-03 MED ORDER — PHENYLEPHRINE HCL 10 MG/ML IJ SOLN
INTRAMUSCULAR | Status: DC | PRN
  Administered 2015-07-03 (×2): 100 ug via INTRAVENOUS

## 2015-07-03 MED ORDER — FENTANYL CITRATE (PF) 100 MCG/2ML IJ SOLN: 25.0000 ug | INTRAMUSCULAR | Status: DC | PRN

## 2015-07-03 MED ORDER — ONDANSETRON HCL 4 MG/2ML IJ SOLN
INTRAMUSCULAR | Status: DC | PRN
  Administered 2015-07-03: 4 mg via INTRAVENOUS

## 2015-07-03 MED ORDER — POTASSIUM CHLORIDE CRYS ER 20 MEQ PO TBCR
20.0000 meq | EXTENDED_RELEASE_TABLET | Freq: Every day | ORAL | Status: DC
  Administered 2015-07-04: 20 meq via ORAL
  Filled 2015-07-03: qty 1

## 2015-07-03 MED ORDER — MIDAZOLAM HCL 2 MG/2ML IJ SOLN
INTRAMUSCULAR | Status: DC | PRN
  Administered 2015-07-03: 2 mg via INTRAVENOUS

## 2015-07-03 MED ORDER — PROPOFOL 500 MG/50ML IV EMUL
INTRAVENOUS | Status: DC | PRN
  Administered 2015-07-03: 120 ug/kg/min via INTRAVENOUS

## 2015-07-03 MED ORDER — PNEUMOCOCCAL VAC POLYVALENT 25 MCG/0.5ML IJ INJ: 0.5000 mL | INJECTION | INTRAMUSCULAR | Status: DC

## 2015-07-03 MED ORDER — SIMVASTATIN 40 MG PO TABS
40.0000 mg | ORAL_TABLET | Freq: Every day | ORAL | Status: DC
  Administered 2015-07-03: 40 mg via ORAL
  Filled 2015-07-03: qty 1

## 2015-07-03 MED ORDER — METFORMIN HCL 500 MG PO TABS
500.0000 mg | ORAL_TABLET | Freq: Two times a day (BID) | ORAL | Status: DC
  Administered 2015-07-03: 500 mg via ORAL
  Filled 2015-07-03: qty 1

## 2015-07-03 MED ORDER — CARVEDILOL 25 MG PO TABS
25.0000 mg | ORAL_TABLET | Freq: Two times a day (BID) | ORAL | Status: DC
  Administered 2015-07-03 – 2015-07-04 (×2): 25 mg via ORAL
  Filled 2015-07-03 (×4): qty 1

## 2015-07-03 MED ORDER — PROPOFOL 10 MG/ML IV BOLUS
INTRAVENOUS | Status: DC | PRN
  Administered 2015-07-03: 50 mg via INTRAVENOUS

## 2015-07-03 MED ORDER — FENTANYL CITRATE (PF) 100 MCG/2ML IJ SOLN
INTRAMUSCULAR | Status: DC | PRN
  Administered 2015-07-03: 50 ug via INTRAVENOUS

## 2015-07-03 MED ORDER — ONDANSETRON HCL 4 MG/2ML IJ SOLN: 4.0000 mg | Freq: Once | INTRAMUSCULAR | Status: DC | PRN

## 2015-07-03 MED ORDER — EPHEDRINE SULFATE 50 MG/ML IJ SOLN
INTRAMUSCULAR | Status: DC | PRN
  Administered 2015-07-03 (×2): 5 mg via INTRAVENOUS

## 2015-07-03 SURGICAL SUPPLY — 40 items
BAG DECANTER FOR FLEXI CONT (MISCELLANEOUS) ×3 IMPLANT
CANISTER SUCT 1200ML W/VALVE (MISCELLANEOUS) ×3 IMPLANT
CHLORAPREP W/TINT 26ML (MISCELLANEOUS) ×3 IMPLANT
COVER LIGHT HANDLE STERIS (MISCELLANEOUS) ×6 IMPLANT
COVER MAYO STAND STRL (DRAPES) ×3 IMPLANT
COVER PROBE FLX POLY STRL (MISCELLANEOUS) ×3 IMPLANT
DRAPE C-ARM XRAY 36X54 (DRAPES) ×3 IMPLANT
DRESSING TELFA 4X3 1S ST N-ADH (GAUZE/BANDAGES/DRESSINGS) ×3 IMPLANT
DRSG TEGADERM 4X4.75 (GAUZE/BANDAGES/DRESSINGS) ×3 IMPLANT
ELECT REM PT RETURN 9FT ADLT (ELECTROSURGICAL) ×3
ELECTRODE REM PT RTRN 9FT ADLT (ELECTROSURGICAL) ×1 IMPLANT
GLOVE BIO SURGEON STRL SZ7.5 (GLOVE) ×3 IMPLANT
GOWN STRL REUS W/ TWL LRG LVL3 (GOWN DISPOSABLE) ×1 IMPLANT
GOWN STRL REUS W/ TWL XL LVL3 (GOWN DISPOSABLE) ×1 IMPLANT
GOWN STRL REUS W/TWL LRG LVL3 (GOWN DISPOSABLE) ×2
GOWN STRL REUS W/TWL XL LVL3 (GOWN DISPOSABLE) ×2
HANDLE YANKAUER SUCT BULB TIP (MISCELLANEOUS) ×3 IMPLANT
ICD VISIA MRI VR DVFB1D4 (ICD Generator) ×1 IMPLANT
IMMOBILIZER SHDR LG LX 900803 (SOFTGOODS) ×3 IMPLANT
IV NS 1000ML (IV SOLUTION) ×2
IV NS 1000ML BAXH (IV SOLUTION) ×1 IMPLANT
KIT RM TURNOVER STRD PROC AR (KITS) ×3 IMPLANT
LABEL OR SOLS (LABEL) ×3 IMPLANT
LEAD SPRINT QUAT SEC 6935M-62 (Lead) ×3 IMPLANT
NEEDLE FILTER BLUNT 18X 1/2SAF (NEEDLE) ×2
NEEDLE FILTER BLUNT 18X1 1/2 (NEEDLE) ×1 IMPLANT
NEEDLE HYPO 25X1 1.5 SAFETY (NEEDLE) ×3 IMPLANT
NEEDLE SPNL 22GX3.5 QUINCKE BK (NEEDLE) ×3 IMPLANT
NS IRRIG 500ML POUR BTL (IV SOLUTION) ×3 IMPLANT
PACK PACE INSERTION (MISCELLANEOUS) ×3 IMPLANT
PAD STATPAD (MISCELLANEOUS) ×3 IMPLANT
SUT DVC V-LOC 4-0 90 CLR P-12 (SUTURE) ×3
SUT DVC VLOC 3-0 CL 6 P-12 (SUTURE) ×3 IMPLANT
SUT SILK 0 CT 1 30 (SUTURE) ×3 IMPLANT
SUT VIC AB 3-0 PS2 18 (SUTURE) ×3 IMPLANT
SUTURE DVC V-LC4-0 90 CLR P-12 (SUTURE) ×1 IMPLANT
SYR CONTROL 10ML (SYRINGE) ×3 IMPLANT
SYRINGE 10CC LL (SYRINGE) ×3 IMPLANT
TOWEL OR 17X26 4PK STRL BLUE (TOWEL DISPOSABLE) ×3 IMPLANT
VISIA MRI VR DVFB1D4 (ICD Generator) ×3 IMPLANT

## 2015-07-03 NOTE — Transfer of Care (Signed)
Immediate Anesthesia Transfer of Care Note  Patient: Matthew Mcgrath  Procedure(s) Performed: Procedure(s): ICD IMPLANT single chamber (Left)  Patient Location: PACU  Anesthesia Type:General  Level of Consciousness: sedated  Airway & Oxygen Therapy: Patient connected to nasal cannula oxygen  Post-op Assessment: Report given to RN and Post -op Vital signs reviewed and stable  Post vital signs: stable  Last Vitals:  Filed Vitals:   07/03/15 1345 07/03/15 1716  BP: 153/90 160/89  Pulse: 69 62  Temp: 36.6 C 37 C  Resp: 16 19    Last Pain: There were no vitals filed for this visit.       Complications: No apparent anesthesia complications

## 2015-07-03 NOTE — Op Note (Signed)
Matthew Mcgrath [id number]  ID:[id number]  Preop [id number] systolic HF NICM Postop Dx same/ICD implant   Procedure: single chamber ICD implantation without intraoperative defibrillation threshold testing  Following the obtaining of informed consent the patient was brought to the electrophysiology laboratory in place of the fluoroscopic table in the supine position. After routine prep and drape, lidocaine was infiltrated in the prepectoral subclavicular region and an incision was made and carried down to the layer of the prepectoral fascia using electrocautery and sharp dissection. A pocket was formed similarly.  Thereafter an attention was turned to gain access to the extrathoracic left subclavian vein which was accomplished without without difficulty and without the aspiration of air or puncture of the artery. A 9 French sheath was placed for which was then passed a  single coil  active fixation defibrillator lead, model 6935M serial number [id number]. It was  passed under fluoroscopic guidance to the right ventricular apex. In its location the bipolar R wave was 14 millivolts, impedance was 510 ohms, the pacing threshold was 0.6 volts at 0.4 msec.   There was no diaphragmatic pacing at 10 V. The current of injury was brisk  The lead was secured to the prepectoral fascia and then attached to a Medtronic ICD, serial number  [id number].  Through the device, the bipolar R wave was >20 millivolts, impedance was 494 ohms, the pacing threshold was 0.5 volts at 0.6msec. High-voltage impedance was 64 ohms.  The pocket was copiously irrigated with antibiotic containing saline solution. Hemostasis was assured, and the device and the lead was placed in the pocket and secured to the prepectoral fascia.  The wound was closed in 3  layers in normal fashion. The wound was washed dried and  benzoin Steri-Strips dressing was then applied. Needle counts sponge counts and instrument counts were correct at the end of the  procedure according to the staff.    Patient tolerated the procedure without apparent complication  EBL  Minimal    Fluoro 2.56      [name],[name], MD 07/03/2015 1:25 PM

## 2015-07-03 NOTE — Anesthesia Postprocedure Evaluation (Signed)
Anesthesia Post Note  Patient: ROMIO DEFORD  Procedure(s) Performed: Procedure(s) (LRB): ICD IMPLANT single chamber (Left)  Patient location during evaluation: PACU Anesthesia Type: General Level of consciousness: awake and alert Pain management: pain level controlled Vital Signs Assessment: post-procedure vital signs reviewed and stable Respiratory status: spontaneous breathing, nonlabored ventilation, respiratory function stable and patient connected to nasal cannula oxygen Cardiovascular status: blood pressure returned to baseline and stable Postop Assessment: no signs of nausea or vomiting Anesthetic complications: no    Last Vitals:  Filed Vitals:   07/03/15 1801 07/03/15 1819  BP: 162/86 177/91  Pulse: 55 56  Temp: 36.4 C 36.4 C  Resp: 12 20    Last Pain:  Filed Vitals:   07/03/15 2024  PainSc: 0-No pain                 Martha Clan

## 2015-07-03 NOTE — Anesthesia Preprocedure Evaluation (Signed)
Anesthesia Evaluation  Patient identified by MRN, date of birth, ID band Patient awake    Reviewed: Allergy & Precautions, NPO status , Patient's Chart, lab work & pertinent test results, reviewed documented beta blocker date and time   Airway Mallampati: II  TM Distance: >3 FB     Dental  (+) Chipped   Pulmonary Current Smoker,           Cardiovascular hypertension, Pt. on medications and Pt. on home beta blockers +CHF       Neuro/Psych    GI/Hepatic   Endo/Other  diabetes, Type 2  Renal/GU      Musculoskeletal   Abdominal   Peds  Hematology   Anesthesia Other Findings   Reproductive/Obstetrics                             Anesthesia Physical Anesthesia Plan  ASA: III  Anesthesia Plan: MAC   Post-op Pain Management:    Induction:   Airway Management Planned:   Additional Equipment:   Intra-op Plan:   Post-operative Plan:   Informed Consent: I have reviewed the patients History and Physical, chart, labs and discussed the procedure including the risks, benefits and alternatives for the proposed anesthesia with the patient or authorized representative who has indicated his/her understanding and acceptance.     Plan Discussed with: CRNA  Anesthesia Plan Comments:         Anesthesia Quick Evaluation

## 2015-07-03 NOTE — H&P (Signed)
Patient Profile:   Matthew Mcgrath is a very pleasant 70 y.o. male who I was asked to see by my colleague for consideration of a primary prevention ICD in a patient with nonischemic dilated cardiomyopathy.  Problem List:  Problem List Date Reviewed: May 08, 2015  Codes Priority Class Noted - Resolved  Chronic systolic HF (heart failure) (CMS-HCC) ICD-10-CM: [id number] ICD-9-CM: 428.22 06/06/2015 - Present  S/P cardiac catheterization ICD-10-CM: Z98.890 ICD-9-CM: V45.89 05-08-2015 - Present  Overview Signed 05-08-2015 10:25 AM by Richardo Priest, RN  Insignificant CAD. 04-14-15   Cardiomyopathy (CMS-HCC) ICD-10-CM: I42.9 ICD-9-CM: 425.4 May 08, 2015 - Present  Overview Signed 05/08/2015 10:26 AM by Richardo Priest, RN  Severe LV EF 22%   Peripheral vascular disease (CMS-HCC) ICD-10-CM: I73.9 ICD-9-CM: 443.9 04/09/2015 - Present  Chest pain with high risk for cardiac etiology, unspecified ICD-10-CM: R07.9 ICD-9-CM: 786.50 04/08/2015 - Present  Aneurysm of iliac artery (CMS-HCC) ICD-10-CM: I72.3 ICD-9-CM: 442.2 03/10/2015 - Present  Pedal edema ICD-10-CM: R60.0 ICD-9-CM: 782.3 11/03/2014 - Present  Acute on chronic combined systolic and diastolic heart failure (CMS-HCC) ICD-10-CM: I50.43 ICD-9-CM: 428.43 09/20/2014 - Present  Elevation of level of transaminase and lactic acid dehydrogenase (LDH) ICD-10-CM: R74.0 ICD-9-CM: 790.4 09/20/2014 - Present  Acute on chronic combined systolic and diastolic congestive heart failure (CMS-HCC) ICD-10-CM: I50.43 ICD-9-CM: 428.43, 428.0 09/20/2014 - Present  Type 2 diabetes mellitus without complications ([place]) 0000000: E11.9 ICD-9-CM: 250.00 05/02/2014 - Present  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 aspirin 81 MG chewable tablet Take 81 mg by mouth.  . carvedilol (COREG) 25 MG tablet Take 25 mg by mouth 2 (two) times daily with meals.  . FUROsemide (LASIX) 40 MG tablet Take 40 mg by mouth 2 (two) times daily.   Marland Kitchen glipiZIDE (GLUCOTROL) 5 MG tablet Take 5 mg by mouth.  Marland Kitchen lisinopril (PRINIVIL,ZESTRIL) 40 MG tablet Take 40 mg by mouth once daily.  . magnesium oxide (MAG-OX) 400 mg tablet Take 400 mg by mouth 2 (two) times daily.  . metFORMIN (GLUCOPHAGE) 500 MG tablet Take 500 mg by mouth 2 (two) times daily with meals.   . nicotine polacrilex (NICORETTE) 2 mg gum 2 mg.  . potassium chloride (KLOR-CON) 10 MEQ ER tablet Take 10 mEq by mouth once daily.  . simvastatin (ZOCOR) 40 MG tablet Take 40 mg by mouth.  . spironolactone (ALDACTONE) 25 MG tablet Take 25 mg by mouth once daily.   No current facility-administered medications for this visit.    Allergies:  Review of patient's allergies indicates no known allergies.  History of Present Illness   Mr. Fonger is a very pleasant 70 y.o. male with hypertension and nonischemic dilated cardiomyopathy. The patient has had shortness of breath, has not had chest pain, has not had near syncope, has not syncope, and has not had palpitations. The patient has had orthopnea or PND. Patient was hospitalized at [facility] in [facility] July or August 2016 for acute on chronic congestive heart failure with shortness of breath and pedal edema and clinically improved after diuresis with intravenous furosemide. Echocardiogram at that time  revealed LV ejection fraction of 15-20%. He underwent an exercise Cardiolite study in January 2017 that revealed  moderate inferior scar with mild apical wall ischemia. The patient underwent cardiac catheterization at [facility] on 04/14/2015 which revealed insignificant coronary artery disease, severe dilated cardiomyopathy, with LV ejection fraction of 22%. His medical therapy has been optimized since his hospitalization in August. He currently is taking carvedilol 25 mg twice daily Lasix 40 mg twice daily and lisinopril 40 mg daily. Spironolactone also was added to his medical regimen. 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  Family History   Patient denies any family history of sudden cardiac death. His mother had history of congestive heart failure and his father had a myocardial infarction.  Social History   Social History   Social History  . Marital status: Single  Spouse name: N/A  . Number of children: N/A  . Years of education: N/A   Occupational History  . Not on file.   Social History Main Topics  . Smoking status: Current Every Day Smoker  Types: Cigarettes  . Smokeless tobacco: Not on file  Comment: 4 cigs a day  . Alcohol use No  . Drug use: No  . Sexual activity: Not on file   Other Topics Concern  . Not on file   Social History Narrative   Review of Systems   14 systems reviewed pertinent positives and negatives as noted in the HPI and below.   Pertinent positives include prior heart failure irregular heartbeats decrease in exercise tolerance teeth problems.  Physical Examinations   Vital Signs:  Visit Vitals  . BP 138/78 (BP Location: Left upper arm, Patient Position: Sitting, BP Cuff Size: Adult)  . Pulse 66  . Ht 170.2 cm (5\' 7" )  . Wt 77.6 kg (171 lb 2 oz)  . SpO2 98%  . BMI 26.8 kg/m2  Body mass index is 26.8 kg/(m^2). General appearance: He appears well, in no apparent distress. Alert and oriented times three.   Neck: supple, no significant adenopathy, carotids upstroke normal bilaterally, no  bruits   Thyroid: thyroid is normal in size without nodules or tenderness   Lungs: clear to auscultation, no wheezes, rales or rhonchi, symmetric air entry   CV: normal rate and regular rhythm  Abdomen: soft, nontender, nondistended, no masses or organomegaly   Neurological: alert, oriented, normal speech, no focal findings or movement disorder noted   Musculoskeletal: full range of motion without pain, moves all extremities. Ambulates without difficulty.  Extremities: peripheral pulses normal, no pedal edema, no clubbing or cyanosis   ECG  Rhythm: normal sinus rhythm, rate= 63 bpm, QTc= 416 Ms. QRS= 106 no ST segment or T-wave abnormalities.  Labs   Hematology Lab Results  Component Value Date  WBC 6.1 04/08/2015  HGB 14.5 04/08/2015  HCT 44.2 04/08/2015  MCV 95.7 04/08/2015  PLT 193 04/08/2015   Chemistries Lab Results  Component Value Date  CREATININE 0.7 04/08/2015  BUN 17 04/08/2015  NA 139 04/08/2015  K 4.2 04/08/2015  CL 103 04/08/2015  CO2 29.4 04/08/2015   Liver Function Studies Lab Results  Component Value Date  ALT 44 04/08/2015  AST 33 04/08/2015  ALKPHOS 161 (H) 04/08/2015   Thyroid Function Studies No results found for: TSH, T3TOTAL, T4TOTAL, THYROIDAB  INR No results found for: INR  Assessment and Plan  Given the patient's ejection fraction of 22% New [place] Heart Association class II to III heart failure on optimal  medical therapy for many months, he is at an increased risk of sudden cardiac death and will likely benefit from an ICD. I spent some time with him reviewing the potential benefits and risks of the procedure. Specifically, I informed him that the potential risks include but are not limited to infection, bleeding, pneumothorax, and cardiac perforation with or without tamponade. He verbalized understanding, and would like additional time to discuss and consider device implantation prior to proceeding. I spent 60 minutes reviewing with him  indications for ICD therapy specifics around device implantation pathophysiology of heart failure and potential etiologies for his cardio myopathy. I answered all questions and will ultimately await his decision before scheduling for an ICD implant. He would be a candidate for single-chamber ICD based on available data.  Chronic systolic heart failure patient is on excellent medical regimen appears euvolemic today. Unfortunately has not had any significant improvement in his ejection fraction despite optimizing medical therapy. He will continue to be followed by his primary cardiologist.

## 2015-07-04 ENCOUNTER — Observation Stay: Payer: Medicare (Managed Care)

## 2015-07-04 DIAGNOSIS — I5022 Chronic systolic (congestive) heart failure: Secondary | ICD-10-CM | POA: Diagnosis not present

## 2015-07-04 MED ORDER — CEPHALEXIN 250 MG PO CAPS: 250.0000 mg | ORAL_CAPSULE | Freq: Four times a day (QID) | ORAL | Status: DC

## 2015-07-04 NOTE — Progress Notes (Signed)
Pt was received ICD instructions. Dressing was removed by MD Parashos. Pt reports no pain. A & O. Takes meds ok. NSR. ROom air. ICD card/prescriptions given to pt. Discharge instructions given to pt. Pt has no further concerns at this time.

## 2015-07-04 NOTE — Discharge Instructions (Signed)
May shower 07/05/2015. Do not lift left arm above head.

## 2015-07-04 NOTE — Final Progress Note (Signed)
Physician Final Progress Note  Patient ID: Matthew Mcgrath MRN: [id number] DOB/AGE: Jul 21, 1945 70 y.o.  Admit date: 07/03/2015 Admitting provider: Marzetta Board, MD Discharge date: 07/04/2015   Admission Diagnoses: Chronic systolic congestive heart failure  Discharge Diagnoses:  Active Problems:   Chronic systolic heart failure (HCC)  Nonischemic dilated cardiomyopathy  Consults: None  Significant Findings/ Diagnostic Studies: radiology: CXR: No pneumothorax  Procedures: Single chamber ICD  Discharge Condition: good  Disposition: 01-Home or Self Care  Diet: Cardiac diet  Discharge Activity: Do not lift left arm above  head     Medication List    TAKE these medications        aspirin 81 MG tablet  Take 81 mg by mouth daily.     carvedilol 25 MG tablet  Commonly known as:  COREG  Take 25 mg by mouth 2 (two) times daily with a meal.     cephALEXin 250 MG capsule  Commonly known as:  KEFLEX  Take 1 capsule (250 mg total) by mouth 4 (four) times daily.     furosemide 40 MG tablet  Commonly known as:  LASIX  Take 40 mg by mouth 2 (two) times daily.     glipiZIDE 5 MG tablet  Commonly known as:  GLUCOTROL  Take by mouth daily before breakfast.     lisinopril 40 MG tablet  Commonly known as:  PRINIVIL,ZESTRIL  Take 40 mg by mouth daily.     magnesium oxide 400 MG tablet  Commonly known as:  MAG-OX  Take 400 mg by mouth 2 (two) times daily.     metFORMIN 500 MG tablet  Commonly known as:  GLUCOPHAGE  Take by mouth 2 (two) times daily with a meal.     potassium chloride 10 MEQ tablet  Commonly known as:  K-DUR,KLOR-CON  Take 20 mEq by mouth daily.     simvastatin 40 MG tablet  Commonly known as:  ZOCOR  Take 40 mg by mouth daily at 6 PM.     spironolactone 25 MG tablet  Commonly known as:  ALDACTONE  Take 25 mg by mouth daily.           Follow-up Information    Follow up with Nayden Czajka, MD In 1 week.   Specialty:  Cardiology   Contact information:   [name] [place] [address] [phone]       Total time spent taking care of this patient: 25 minutes  Signed: Daniele Dillow 07/04/2015, 9:40 AM

## 2015-07-04 NOTE — Care Management Obs Status (Signed)
[place] NOTIFICATION   Patient Details  Name: Matthew Mcgrath MRN: [id number] Date of Birth: 08/21/1945   Medicare Observation Status Notification Given:  No (observation less than 24 h)    Ival Bible, RN 07/04/2015, 5:04 PM

## 2015-07-05 ENCOUNTER — Encounter: Payer: Self-pay | Admitting: Cardiology

## 2015-07-23 DIAGNOSIS — Z95810 Presence of automatic (implantable) cardiac defibrillator: Secondary | ICD-10-CM | POA: Insufficient documentation

## 2016-04-12 ENCOUNTER — Ambulatory Visit (INDEPENDENT_AMBULATORY_CARE_PROVIDER_SITE_OTHER): Payer: Medicare (Managed Care) | Admitting: Vascular Surgery

## 2016-04-12 ENCOUNTER — Encounter (INDEPENDENT_AMBULATORY_CARE_PROVIDER_SITE_OTHER): Payer: Self-pay | Admitting: Vascular Surgery

## 2016-04-12 VITALS — BP 124/81 | HR 65 | Resp 18 | Ht 66.0 in | Wt 142.0 lb

## 2016-04-12 DIAGNOSIS — I10 Essential (primary) hypertension: Secondary | ICD-10-CM

## 2016-04-12 DIAGNOSIS — E119 Type 2 diabetes mellitus without complications: Secondary | ICD-10-CM | POA: Diagnosis not present

## 2016-04-12 DIAGNOSIS — Z720 Tobacco use: Secondary | ICD-10-CM

## 2016-04-12 DIAGNOSIS — I723 Aneurysm of iliac artery: Secondary | ICD-10-CM

## 2016-04-12 NOTE — Assessment & Plan Note (Signed)
blood glucose control important in reducing the progression of atherosclerotic disease. Also, involved in wound healing. On appropriate medications.  

## 2016-04-12 NOTE — Patient Instructions (Signed)
Abdominal Aortic Aneurysm An aneurysm is a bulge in an artery. It happens when blood pushes up against a weakened or damaged artery wall. An abdominal aortic aneurysm is an aneurysm that occurs in the lower part of the aorta, the main artery of the body. The aorta supplies blood from the heart to the rest of the body. Some aneurysms may not cause symptoms or problems. However, the major concern with an abdominal aortic aneurysm is that it can enlarge and burst (rupture), or that blood can flow between the layers of the wall of the aorta through a tear (aorticdissection). Both of these conditions can cause bleeding inside the body and can be life-threatening unless diagnosed and treated right away. What are the causes? The exact cause of this condition is not known. What increases the risk? The following factors may make you more likely to develop this condition:  Being older than age 60.  Having a hardening of the arteries caused by the buildup of fat and other substances in the lining of a blood vessel (arteriosclerosis).  Having inflammation of the walls of an artery (arteritis).  Having a genetic disease that weakens the body's connective tissue, such as Marfan syndrome.  Having abdominal trauma.  Having an infection, such as syphilis or staphylococcus, in the wall of the aorta (infectious aortitis) caused by bacteria.  Having high blood pressure (hypertension).  Being male.  Being white (Caucasian).  Having high cholesterol.  Having a family history of aneurysms.  Using tobacco.  Having chronic obstructive pulmonary disease (COPD). What are the signs or symptoms? Symptoms of this condition vary depending on the size and rate of growth of the aneurysm.Most aneurysms grow slowly and do not cause any symptoms. When symptoms do occur, they may include:  Pain in the abdomen, side, or lower back. The pain may vary in intensity.  Feeling full after eating only small amounts of  food.  Feeling a pulsating lump in the abdomen. Symptoms that the aneurysm has ruptured include:  A sudden onset of severe pain in the abdomen, side, or lower back.  Nausea or vomiting.  Feeling faint or passing out. How is this diagnosed? This condition may be diagnosed with:  A physical exam. During the exam, your health care provider will check for throbbing in your abdomen. He or she may also listen to the blood flow in your abdomen.  Tests, such as:  An ultrasound.  X-rays.  A CT scan.  An MRI.  Tests to check your arteries for damage or blockage (angiogram). Because most unruptured abdominal aortic aneurysms cause no symptoms, they are often found during exams for other conditions. How is this treated? Treatment for this condition depends on:  The size of the aneurysm.  How fast the aneurysm is growing.  Your age.  Risk factors for rupture. Aneurysms that are smaller than 2 inches (5 cm) may be managed by using medicines to control blood pressure, manage pain, or fight infection. You may need regular monitoring to see if the aneurysm is getting bigger. Your health care provider may recommend that you have an ultrasound every few years, every year, or every 3-6 months. How often you need to have an ultrasound depends on the size of the aneurysm, how fast it is growing, and whether you have a family history of aneurysms. Surgical repair may be needed if your aneurysm is larger than 2 inches (5 cm). Follow these instructions at home: General instructions   Keep all follow-up visits as told   by your health care provider. This is important.  Talk to your health care provider about regular screenings to see if the aneurysm is getting bigger.  Take over-the-counter and prescription medicines only as told by your health care provider.  Avoid heavy lifting and activities that take a lot of effort (are strenuous). Ask your health care provider what activities are safe for  you. Lifestyle  Follow instructions from your health care provider about healthy lifestyle habits. Your health care provider may recommend:  Not using any products that contain nicotine or tobacco, such as cigarettes and e-cigarettes. If you need help quitting, ask your health care provider.  Limiting or avoiding alcohol.  Keeping your blood pressure within normal limits. The target limit for most people is below 120/80. Check your blood pressure regularly. If it is high, ask your health care provider about ways that you can control it.  Keeping your blood sugar level and cholesterol levels within normal limits. Target limits for most people are:  Blood sugar level: Less than 100 mg/dL.  Total cholesterol level: Less than 200 mg/dL.  Eating a healthy diet. This may include:  Lowering your salt (sodium) intake. In some people, too much salt can raise blood pressure and increase the risk of abdominal aortic aneurysm.  Avoiding foods that are high in saturated fat and cholesterol, such as red meat and dairy products.  Eating a diet that is low in sugar.  Increasing your fiber intake by including whole grains, vegetables, and fruits in your diet. Eating these foods may help to lower blood pressure.  Maintaining a healthy weight.  Staying physically active and exercising regularly. Talk with your health care provider about how often you should exercise and which types of exercise are safe for you. Contact a health care provider if:  You have pain in your abdomen, side, or lower back.  You have a throbbing feeling in your abdomen.  You have a family history of aneurysms. Get help right away if:  You have sudden, severe pain in your abdomen or lower back.  You experience nausea or vomiting.  You have constipation or problems urinating.  You feel light-headed.  You have a rapid heart rate when you stand.  You have sweaty, clammy skin.  You have shortness of breath.  You  have a fever. This information is not intended to replace advice given to you by your health care provider. Make sure you discuss any questions you have with your health care provider. Document Released: 12/01/2004 Document Revised: 09/16/2015 Document Reviewed: 08/11/2015 Elsevier Interactive Patient Education  2017 [employer]  

## 2016-04-12 NOTE — Progress Notes (Signed)
Patient ID: Matthew Mcgrath, male   DOB: [birth year], 71 y.o.   MRN: [id number]  Chief Complaint  Patient presents with  . New Evaluation    Iliac artery    HPI Matthew Mcgrath is a 71 y.o. male.  I am asked to see the patient by Dr. 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  The patient reports no aneurysm related symptoms. Specifically, the patient denies new back or abdominal pain, or signs of peripheral embolization. The last study I see was a duplex performed about a year ago which demonstrated an ectatic but not aneurysmal abdominal and 3 cm in maximal diameter. The left common iliac artery was about 1 cm. The right common iliac artery measured 1.9 cm in maximal diameter which would qualify as an aneurysm. He does have high blood pressure and he does smoke although he says he has cut way back on his smoking. He is without specific complaints today.   Past Medical History:  Diagnosis Date  . Cardiomyopathy ([place])   . Congestive heart failure ([place])   . Diabetes mellitus type 2, controlled ([place])   . Elevated lipids   . HTN (hypertension)     Past Surgical History:  Procedure Laterality Date  . CARDIAC CATHETERIZATION N/A 04/14/2015   Procedure: Left Heart Cath and Coronary Angiography;  Surgeon: [name], MD;  Location: [facility];  Service: Cardiovascular;  Laterality: N/A;  . CATARACT EXTRACTION W/ INTRAOCULAR LENS  IMPLANT, BILATERAL Left   . HERNIA REPAIR    . IMPLANTABLE CARDIOVERTER DEFIBRILLATOR (ICD) GENERATOR CHANGE Left 07/03/2015   Procedure: ICD IMPLANT single chamber;  Surgeon: Marzetta Board, MD;  Location: [facility];  Service: Cardiovascular;  Laterality: Left;    Family History  Problem Relation Age of Onset  . Heart failure Mother   . Heart disease Mother   . Alcoholism Father   . Hypertension Sister   . Diabetes Sister   . Hypertension Brother   . Diabetes Brother   No bleeding or clotting  disorders  Social History Social History  Substance Use Topics  . Smoking status: Current Every Day Smoker    Packs/day: 0.25    Years: 30.00    Types: Cigarettes  . Smokeless tobacco: Never Used  . Alcohol use No  No IV drug use  No Known Allergies  Current Outpatient Prescriptions  Medication Sig Dispense Refill  . aspirin 81 MG tablet Take 81 mg by mouth daily.    . carvedilol (COREG) 25 MG tablet Take 25 mg by mouth 2 (two) times daily with a meal.    . cephALEXin (KEFLEX) 250 MG capsule Take 1 capsule (250 mg total) by mouth 4 (four) times daily. 28 capsule 0  . furosemide (LASIX) 40 MG tablet Take 40 mg by mouth 2 (two) times daily.    Marland Kitchen glipiZIDE (GLUCOTROL) 5 MG tablet Take by mouth daily before breakfast.    . lisinopril (PRINIVIL,ZESTRIL) 40 MG tablet Take 40 mg by mouth daily.    . magnesium oxide (MAG-OX) 400 MG tablet Take 400 mg by mouth 2 (two) times daily.    . metFORMIN (GLUCOPHAGE) 500 MG tablet Take by mouth 2 (two) times daily with a meal.    . potassium chloride (K-DUR,KLOR-CON) 10 MEQ tablet Take 20 mEq by mouth daily.     . simvastatin (ZOCOR) 40 MG tablet Take 40 mg by mouth daily at 6 PM.     . spironolactone (  ALDACTONE) 25 MG tablet Take 25 mg by mouth daily.     No current facility-administered medications for this visit.       REVIEW OF SYSTEMS (Negative unless checked)  Constitutional: [] Weight loss  [] Fever  [] Chills Cardiac: [] Chest pain   [] Chest pressure   [] Palpitations   [] Shortness of breath when laying flat   [] Shortness of breath at rest   [] Shortness of breath with exertion. Vascular:  [] Pain in legs with walking   [] Pain in legs at rest   [] Pain in legs when laying flat   [] Claudication   [] Pain in feet when walking  [] Pain in feet at rest  [] Pain in feet when laying flat   [] History of DVT   [] Phlebitis   [x] Swelling in legs   [] Varicose veins   [] Non-healing ulcers Pulmonary:   [] Uses home oxygen   [] Productive cough   [] Hemoptysis    [] Wheeze  [] COPD   [] Asthma Neurologic:  [] Dizziness  [] Blackouts   [] Seizures   [] History of stroke   [] History of TIA  [] Aphasia   [] Temporary blindness   [] Dysphagia   [] Weakness or numbness in arms   [] Weakness or numbness in legs Musculoskeletal:  [x] Arthritis   [] Joint swelling   [] Joint pain   [] Low back pain Hematologic:  [] Easy bruising  [] Easy bleeding   [] Hypercoagulable state   [] Anemic  [] Hepatitis Gastrointestinal:  [] Blood in stool   [] Vomiting blood  [] Gastroesophageal reflux/heartburn   [] Abdominal pain Genitourinary:  [] Chronic kidney disease   [] Difficult urination  [] Frequent urination  [] Burning with urination   [] Hematuria Skin:  [] Rashes   [] Ulcers   [] Wounds Psychological:  [] History of anxiety   []  History of major depression.    Physical Exam BP 124/81 (BP Location: Right Arm)   Pulse 65   Resp 18   Ht 5\' 6"  (1.676 m)   Wt 142 lb (64.4 kg)   BMI 22.92 kg/m  Gen:  WD/WN, NAD. Appears older than stated age Head: [place]/AT, No temporalis wasting. Prominent temp pulse not noted. Ear/Nose/Throat: Hearing grossly intact, nares w/o erythema or drainage, dentition very poor Eyes: Conjunctiva clear, sclera non-icteric  Neck: trachea midline.  No JVD.  Pulmonary:  Good air movement, clear to auscultation bilaterally.  Cardiac: RRR, normal S1, S2, no Murmurs, rubs or gallops. Vascular:  Vessel Right Left  Radial Palpable Palpable  Ulnar Palpable Palpable  Brachial Palpable Palpable  Carotid Palpable, without bruit Palpable, without bruit  Aorta Not palpable N/A  Femoral Palpable Palpable  Popliteal Not Palpable Not Palpable  PT 1+ Palpable Trace Palpable  DP 1+ Palpable 1+ Palpable   Gastrointestinal: soft, non-tender/non-distended. No guarding/reflex. No masses, surgical incisions, or scars. Musculoskeletal: M/S 5/5 throughout.  Extremities without ischemic changes.  No deformity or atrophy. 1+ BLE edema. Neurologic: Sensation grossly intact in extremities.   Symmetrical.  Speech is fluent. Motor exam as listed above. Psychiatric: Judgment intact, Mood & affect appropriate for pt's clinical situation. Dermatologic: No rashes or ulcers noted.  No cellulitis or open wounds. Lymph : No Cervical, Axillary, or Inguinal lymphadenopathy.   Radiology No results found.  Labs No results found for this or any previous visit (from the past 2160 hour(s)).  Assessment/Plan:  Tobacco abuse We had a discussion for approximately 3-4 minutes regarding the absolute need for smoking cessation due to the deleterious nature of tobacco on the vascular system. We discussed the tobacco use would diminish patency of any intervention, and likely significantly worsen progression of disease, especially aneurysmal disease. We discussed  multiple agents for quitting including replacement therapy or medications to reduce cravings such as Chantix. The patient voices their understanding of the importance of smoking cessation.   Diabetes blood glucose control important in reducing the progression of atherosclerotic disease. Also, involved in wound healing. On appropriate medications.   Essential (primary) hypertension blood pressure control important in reducing the progression of atherosclerotic disease and aneurysmal degeneration. On appropriate oral medications.   Iliac artery aneurysm (HCC) The last study I see was a duplex performed about a year ago which demonstrated an ectatic but not aneurysmal abdominal and 3 cm in maximal diameter. The left common iliac artery was about 1 cm. The right common iliac artery measured 1.9 cm in maximal diameter which would qualify as an aneurysm. At this size, the risk of rupture would be extremely low but we should recheck this and would likely follow this on an every 6 month or 12 month intervals in follow-up. We discussed the pathophysiology and natural history of aneurysm disease. I have recommended smoking cessation and blood pressure  control.      Leotis Pain 04/12/2016, 2:06 PM   This note was created with Dragon medical transcription system.  Any errors from dictation are unintentional.

## 2016-04-12 NOTE — Assessment & Plan Note (Signed)
The last study I see was a duplex performed about a year ago which demonstrated an ectatic but not aneurysmal abdominal and 3 cm in maximal diameter. The left common iliac artery was about 1 cm. The right common iliac artery measured 1.9 cm in maximal diameter which would qualify as an aneurysm. At this size, the risk of rupture would be extremely low but we should recheck this and would likely follow this on an every 6 month or 12 month intervals in follow-up. We discussed the pathophysiology and natural history of aneurysm disease. I have recommended smoking cessation and blood pressure control.

## 2016-04-12 NOTE — Assessment & Plan Note (Signed)
We had a discussion for approximately 3-4 minutes regarding the absolute need for smoking cessation due to the deleterious nature of tobacco on the vascular system. We discussed the tobacco use would diminish patency of any intervention, and likely significantly worsen progression of disease, especially aneurysmal disease. We discussed multiple agents for quitting including replacement therapy or medications to reduce cravings such as Chantix. The patient voices their understanding of the importance of smoking cessation.

## 2016-04-12 NOTE — Assessment & Plan Note (Signed)
blood pressure control important in reducing the progression of atherosclerotic disease and aneurysmal degeneration. On appropriate oral medications.  

## 2016-05-04 ENCOUNTER — Other Ambulatory Visit: Payer: Self-pay | Admitting: Family

## 2016-05-04 DIAGNOSIS — I723 Aneurysm of iliac artery: Secondary | ICD-10-CM

## 2016-05-10 ENCOUNTER — Ambulatory Visit: Payer: Medicare (Managed Care)

## 2016-05-17 ENCOUNTER — Other Ambulatory Visit: Payer: Medicare (Managed Care)

## 2016-05-17 ENCOUNTER — Ambulatory Visit: Admission: RE | Admit: 2016-05-17 | Payer: Medicare (Managed Care) | Source: Ambulatory Visit

## 2016-05-20 ENCOUNTER — Other Ambulatory Visit: Payer: Self-pay | Admitting: Family

## 2016-05-20 ENCOUNTER — Ambulatory Visit
Admission: RE | Admit: 2016-05-20 | Discharge: 2016-05-20 | Disposition: A | Discharge status: Home / Self Care | Payer: Medicare (Managed Care) | Source: Ambulatory Visit | Attending: Family | Admitting: Family

## 2016-05-20 DIAGNOSIS — I714 Abdominal aortic aneurysm, without rupture, unspecified: Secondary | ICD-10-CM

## 2016-05-20 DIAGNOSIS — I723 Aneurysm of iliac artery: Secondary | ICD-10-CM

## 2016-05-23 ENCOUNTER — Other Ambulatory Visit: Payer: Self-pay | Admitting: Family Medicine

## 2016-06-01 ENCOUNTER — Ambulatory Visit (INDEPENDENT_AMBULATORY_CARE_PROVIDER_SITE_OTHER): Payer: Medicare (Managed Care) | Admitting: Vascular Surgery

## 2016-06-01 ENCOUNTER — Other Ambulatory Visit (INDEPENDENT_AMBULATORY_CARE_PROVIDER_SITE_OTHER): Payer: Medicare (Managed Care)

## 2016-11-30 ENCOUNTER — Ambulatory Visit (INDEPENDENT_AMBULATORY_CARE_PROVIDER_SITE_OTHER): Payer: Medicare Other | Admitting: Vascular Surgery

## 2016-11-30 ENCOUNTER — Other Ambulatory Visit (INDEPENDENT_AMBULATORY_CARE_PROVIDER_SITE_OTHER): Payer: Medicare (Managed Care)

## 2017-09-05 ENCOUNTER — Other Ambulatory Visit: Payer: Self-pay | Admitting: Family

## 2017-09-05 DIAGNOSIS — I501 Left ventricular failure: Secondary | ICD-10-CM

## 2017-09-06 ENCOUNTER — Other Ambulatory Visit: Payer: Self-pay | Admitting: Family

## 2017-09-06 DIAGNOSIS — I723 Aneurysm of iliac artery: Secondary | ICD-10-CM

## 2017-09-21 ENCOUNTER — Ambulatory Visit
Admission: RE | Admit: 2017-09-21 | Discharge: 2017-09-21 | Disposition: A | Discharge status: Home / Self Care | Payer: Medicare (Managed Care) | Source: Ambulatory Visit | Attending: Family | Admitting: Family

## 2017-09-21 DIAGNOSIS — I723 Aneurysm of iliac artery: Secondary | ICD-10-CM | POA: Diagnosis not present

## 2017-09-26 ENCOUNTER — Ambulatory Visit
Admission: RE | Admit: 2017-09-26 | Discharge: 2017-09-26 | Disposition: A | Discharge status: Home / Self Care | Payer: Medicare (Managed Care) | Source: Ambulatory Visit | Attending: Family | Admitting: Family

## 2017-09-26 DIAGNOSIS — I110 Hypertensive heart disease with heart failure: Secondary | ICD-10-CM | POA: Insufficient documentation

## 2017-09-26 DIAGNOSIS — I429 Cardiomyopathy, unspecified: Secondary | ICD-10-CM | POA: Diagnosis not present

## 2017-09-26 DIAGNOSIS — I501 Left ventricular failure: Secondary | ICD-10-CM | POA: Diagnosis not present

## 2017-09-26 DIAGNOSIS — I509 Heart failure, unspecified: Secondary | ICD-10-CM | POA: Diagnosis not present

## 2017-09-26 DIAGNOSIS — I081 Rheumatic disorders of both mitral and tricuspid valves: Secondary | ICD-10-CM | POA: Diagnosis not present

## 2017-09-26 DIAGNOSIS — E119 Type 2 diabetes mellitus without complications: Secondary | ICD-10-CM | POA: Insufficient documentation

## 2017-09-26 DIAGNOSIS — I723 Aneurysm of iliac artery: Secondary | ICD-10-CM | POA: Insufficient documentation

## 2017-09-26 DIAGNOSIS — E785 Hyperlipidemia, unspecified: Secondary | ICD-10-CM | POA: Insufficient documentation

## 2017-09-26 NOTE — Progress Notes (Signed)
*  PRELIMINARY RESULTS* Echocardiogram 2D Echocardiogram has been performed.  Matthew Mcgrath 09/26/2017, 11:54 AM

## 2019-05-01 ENCOUNTER — Other Ambulatory Visit: Payer: Self-pay | Admitting: Family Medicine

## 2019-05-01 DIAGNOSIS — I5032 Chronic diastolic (congestive) heart failure: Secondary | ICD-10-CM

## 2019-05-01 DIAGNOSIS — I5020 Unspecified systolic (congestive) heart failure: Secondary | ICD-10-CM

## 2019-05-22 ENCOUNTER — Ambulatory Visit
Admission: RE | Admit: 2019-05-22 | Discharge: 2019-05-22 | Disposition: A | Discharge status: Home / Self Care | Payer: Medicare (Managed Care) | Source: Ambulatory Visit | Attending: Family Medicine | Admitting: Family Medicine

## 2019-05-22 ENCOUNTER — Other Ambulatory Visit: Payer: Self-pay

## 2019-05-22 DIAGNOSIS — I5032 Chronic diastolic (congestive) heart failure: Secondary | ICD-10-CM

## 2019-05-22 DIAGNOSIS — Z95810 Presence of automatic (implantable) cardiac defibrillator: Secondary | ICD-10-CM | POA: Insufficient documentation

## 2019-05-22 DIAGNOSIS — I083 Combined rheumatic disorders of mitral, aortic and tricuspid valves: Secondary | ICD-10-CM | POA: Insufficient documentation

## 2019-05-22 DIAGNOSIS — I110 Hypertensive heart disease with heart failure: Secondary | ICD-10-CM | POA: Diagnosis not present

## 2019-05-22 DIAGNOSIS — F17200 Nicotine dependence, unspecified, uncomplicated: Secondary | ICD-10-CM | POA: Insufficient documentation

## 2019-05-22 DIAGNOSIS — E785 Hyperlipidemia, unspecified: Secondary | ICD-10-CM | POA: Diagnosis not present

## 2019-05-22 DIAGNOSIS — I5042 Chronic combined systolic (congestive) and diastolic (congestive) heart failure: Secondary | ICD-10-CM | POA: Insufficient documentation

## 2019-05-22 DIAGNOSIS — E119 Type 2 diabetes mellitus without complications: Secondary | ICD-10-CM | POA: Diagnosis not present

## 2019-05-22 DIAGNOSIS — I5020 Unspecified systolic (congestive) heart failure: Secondary | ICD-10-CM

## 2019-05-22 NOTE — Progress Notes (Signed)
*  PRELIMINARY RESULTS* Echocardiogram 2D Echocardiogram has been performed.  Wallie Char Alexio Sroka 05/22/2019, 10:57 AM

## 2019-09-06 ENCOUNTER — Other Ambulatory Visit: Payer: Self-pay | Admitting: Family Medicine

## 2019-09-06 DIAGNOSIS — I739 Peripheral vascular disease, unspecified: Secondary | ICD-10-CM

## 2019-09-06 DIAGNOSIS — R109 Unspecified abdominal pain: Secondary | ICD-10-CM

## 2019-09-06 DIAGNOSIS — I714 Abdominal aortic aneurysm, without rupture, unspecified: Secondary | ICD-10-CM

## 2019-09-06 DIAGNOSIS — I723 Aneurysm of iliac artery: Secondary | ICD-10-CM

## 2019-09-12 ENCOUNTER — Ambulatory Visit
Admission: RE | Admit: 2019-09-12 | Discharge: 2019-09-12 | Disposition: A | Discharge status: Home / Self Care | Payer: Medicare (Managed Care) | Source: Ambulatory Visit | Attending: Family Medicine | Admitting: Family Medicine

## 2019-09-12 ENCOUNTER — Other Ambulatory Visit: Payer: Self-pay

## 2019-09-12 DIAGNOSIS — I723 Aneurysm of iliac artery: Secondary | ICD-10-CM

## 2019-09-12 DIAGNOSIS — I714 Abdominal aortic aneurysm, without rupture, unspecified: Secondary | ICD-10-CM

## 2019-09-12 DIAGNOSIS — R109 Unspecified abdominal pain: Secondary | ICD-10-CM

## 2019-09-12 DIAGNOSIS — I739 Peripheral vascular disease, unspecified: Secondary | ICD-10-CM | POA: Diagnosis present

## 2019-10-22 ENCOUNTER — Ambulatory Visit (INDEPENDENT_AMBULATORY_CARE_PROVIDER_SITE_OTHER): Payer: Medicare (Managed Care) | Admitting: Vascular Surgery

## 2019-10-22 ENCOUNTER — Encounter (INDEPENDENT_AMBULATORY_CARE_PROVIDER_SITE_OTHER): Payer: Self-pay | Admitting: Vascular Surgery

## 2019-10-22 ENCOUNTER — Other Ambulatory Visit: Payer: Self-pay

## 2019-10-22 VITALS — BP 162/98 | HR 67 | Resp 16 | Ht 66.5 in | Wt 175.8 lb

## 2019-10-22 DIAGNOSIS — I723 Aneurysm of iliac artery: Secondary | ICD-10-CM | POA: Diagnosis not present

## 2019-10-22 DIAGNOSIS — E119 Type 2 diabetes mellitus without complications: Secondary | ICD-10-CM | POA: Diagnosis not present

## 2019-10-22 DIAGNOSIS — I10 Essential (primary) hypertension: Secondary | ICD-10-CM | POA: Diagnosis not present

## 2019-10-22 DIAGNOSIS — Z720 Tobacco use: Secondary | ICD-10-CM

## 2019-10-22 NOTE — Progress Notes (Signed)
Patient ID: Matthew Mcgrath, male   DOB: 1946-01-31, 74 y.o.   MRN: [id number]  Chief Complaint  Patient presents with  . New Patient (Initial Visit)    re-est AAA care    HPI Matthew Mcgrath is a 74 y.o. male.  I am asked to see the patient by [facility] for evaluation of of his iliac artery aneurysm.  He was last seen in our office about 3-1/2 years ago at which time he had a 1.9 cm right common iliac artery aneurysm.  He has not been seen since that time.  He does continue to smoke.  He does have hypertension.  No current aneurysm related symptoms. Specifically, the patient denies new back or abdominal pain, or signs of peripheral embolization.  He has some chronic low back pain but this is unchanged. A recently performed ultrasound at the hospital was done which I have reviewed.  This demonstrates an aorta with a maximum measurement of 3.0 cm.  The right common iliac artery measures 1.9 cm which is unchanged.  The left common iliac artery slightly enlarged up to 1.5 cm in maximal diameter.     Past Medical History:  Diagnosis Date  . Cardiomyopathy ([place])   . Congestive heart failure ([place])   . Diabetes mellitus type 2, controlled ([place])   . Elevated lipids   . HTN (hypertension)     Past Surgical History:  Procedure Laterality Date  . CARDIAC CATHETERIZATION N/A 04/14/2015   Procedure: Left Heart Cath and Coronary Angiography;  Surgeon: [name], MD;  Location: [facility];  Service: Cardiovascular;  Laterality: N/A;  . CATARACT EXTRACTION W/ INTRAOCULAR LENS  IMPLANT, BILATERAL Left   . HERNIA REPAIR    . IMPLANTABLE CARDIOVERTER DEFIBRILLATOR (ICD) GENERATOR CHANGE Left 07/03/2015   Procedure: ICD IMPLANT single chamber;  Surgeon: Marzetta Board, MD;  Location: [facility];  Service: Cardiovascular;  Laterality: Left;     Family History  Problem Relation Age of Onset  . Heart failure Mother   . Heart disease Mother   . Alcoholism Father   .  Hypertension Sister   . Diabetes Sister   . Hypertension Brother   . Diabetes Brother       Social History   Tobacco Use  . Smoking status: Current Every Day Smoker    Packs/day: 0.25    Years: 30.00    Pack years: 7.50    Types: Cigarettes  . Smokeless tobacco: Never Used  Substance Use Topics  . Alcohol use: No  . Drug use: Yes    Frequency: 2.0 times per week    Types: Marijuana    Comment: past use     No Known Allergies  Current Outpatient Medications  Medication Sig Dispense Refill  . acetaminophen (TYLENOL) 650 MG CR tablet Take 650 mg by mouth every 8 (eight) hours as needed for pain.    Marland Kitchen aspirin 81 MG tablet Take 81 mg by mouth daily.    . carvedilol (COREG) 25 MG tablet Take 25 mg by mouth 2 (two) times daily with a meal.    . cholecalciferol (VITAMIN D3) 25 MCG (1000 UNIT) tablet Take 1,000 Units by mouth daily.    . furosemide (LASIX) 40 MG tablet Take 40 mg by mouth daily.     Marland Kitchen glucose blood (TRUE METRIX BLOOD GLUCOSE TEST) test strip 1 each by Other route as needed for other. Use as instructed    . lisinopril (ZESTRIL) 10 MG  tablet Take 10 mg by mouth daily.     . magnesium oxide (MAG-OX) 400 MG tablet Take 400 mg by mouth 2 (two) times daily.    . metFORMIN (GLUCOPHAGE) 500 MG tablet Take by mouth 2 (two) times daily with a meal.    . potassium chloride (K-DUR,KLOR-CON) 10 MEQ tablet Take 20 mEq by mouth daily.     . simvastatin (ZOCOR) 40 MG tablet Take 40 mg by mouth daily at 6 PM.     . spironolactone (ALDACTONE) 25 MG tablet Take 25 mg by mouth daily.    Marland Kitchen tolnaftate (TINACTIN) 1 % spray Apply topically 2 (two) times daily.    . TRUEplus Lancets 28G MISC by Does not apply route.    . cephALEXin (KEFLEX) 250 MG capsule Take 1 capsule (250 mg total) by mouth 4 (four) times daily. (Patient not taking: Reported on 10/22/2019) 28 capsule 0  . glipiZIDE (GLUCOTROL) 5 MG tablet Take by mouth daily before breakfast. (Patient not taking: Reported on 10/22/2019)     . MIRALAX 17 GM/SCOOP powder Take 17 g by mouth daily.     No current facility-administered medications for this visit.      REVIEW OF SYSTEMS (Negative unless checked)  Constitutional: [] Weight loss  [] Fever  [] Chills Cardiac: [] Chest pain   [] Chest pressure   [] Palpitations   [] Shortness of breath when laying flat   [] Shortness of breath at rest   [] Shortness of breath with exertion. Vascular:  [] Pain in legs with walking   [] Pain in legs at rest   [] Pain in legs when laying flat   [] Claudication   [] Pain in feet when walking  [] Pain in feet at rest  [] Pain in feet when laying flat   [] History of DVT   [] Phlebitis   [] Swelling in legs   [] Varicose veins   [] Non-healing ulcers Pulmonary:   [] Uses home oxygen   [] Productive cough   [] Hemoptysis   [] Wheeze  [] COPD   [] Asthma Neurologic:  [] Dizziness  [] Blackouts   [] Seizures   [] History of stroke   [] History of TIA  [] Aphasia   [] Temporary blindness   [] Dysphagia   [] Weakness or numbness in arms   [] Weakness or numbness in legs Musculoskeletal:  [x] Arthritis   [] Joint swelling   [] Joint pain   [] Low back pain Hematologic:  [] Easy bruising  [] Easy bleeding   [] Hypercoagulable state   [] Anemic  [] Hepatitis Gastrointestinal:  [] Blood in stool   [] Vomiting blood  [] Gastroesophageal reflux/heartburn   [] Abdominal pain Genitourinary:  [] Chronic kidney disease   [] Difficult urination  [] Frequent urination  [] Burning with urination   [] Hematuria Skin:  [] Rashes   [] Ulcers   [] Wounds Psychological:  [] History of anxiety   []  History of major depression.    Physical Exam BP (!) 162/98 (BP Location: Right Arm)   Pulse 67   Resp 16   Ht 5' 6.5" (1.689 m)   Wt 175 lb 12.8 oz (79.7 kg)   BMI 27.95 kg/m  Gen:  WD/WN, NAD Head: [place]/AT, No temporalis wasting.  Ear/Nose/Throat: Hearing grossly intact, nares w/o erythema or drainage, oropharynx w/o Erythema/Exudate Eyes: Conjunctiva clear, sclera non-icteric  Neck: trachea midline.  No JVD.    Pulmonary:  Good air movement, respirations not labored, no use of accessory muscles  Cardiac: RRR, no JVD Vascular:  Vessel Right Left  Radial Palpable Palpable  Gastrointestinal:. No masses, surgical incisions, or scars. Musculoskeletal: M/S 5/5 throughout.  Extremities without ischemic changes.  No deformity or atrophy.  No edema. Neurologic: Sensation grossly intact in extremities.  Symmetrical.  Speech is fluent. Motor exam as listed above. Psychiatric: Judgment intact, Mood & affect appropriate for pt's clinical situation. Dermatologic: No rashes or ulcers noted.  No cellulitis or open wounds.    Radiology No results found.  Labs No results found for this or any previous visit (from the past 2160 hour(s)).  Assessment/Plan: Tobacco abuse We had a discussion for approximately 3 minutes regarding the absolute need for smoking cessation due to the deleterious nature of tobacco on the vascular system. We discussed the tobacco use would diminish patency of any intervention, and likely significantly worsen progression of disease, especially aneurysmal disease. We discussed multiple agents for quitting including replacement therapy or medications to reduce cravings such as Chantix. The patient voices their understanding of the importance of smoking cessation.   Diabetes blood glucose control important in reducing the progression of atherosclerotic disease. Also, involved in wound healing. On appropriate medications.   Essential (primary) hypertension blood pressure control important in reducing the progression of atherosclerotic disease and aneurysmal degeneration. On appropriate oral medications.  Iliac artery aneurysm ([place]) A recently performed ultrasound at the hospital was done which I have reviewed.  This demonstrates an aorta with a maximum measurement of 3.0 cm.  The right common iliac artery measures 1.9 cm which is unchanged.   The left common iliac artery slightly enlarged up to 1.5 cm in maximal diameter.    This does not pose him any immediate risk, but his blood pressure is poorly controlled and he continues to smoke so he is at risk of continued growth.  At this point, I would recommend continued annual follow-up.  Smoking cessation and blood pressure control were stressed again today.      Leotis Pain 10/22/2019, 11:00 AM   This note was created with Dragon medical transcription system.  Any errors from dictation are unintentional.

## 2019-10-22 NOTE — Assessment & Plan Note (Signed)
A recently performed ultrasound at the hospital was done which I have reviewed.  This demonstrates an aorta with a maximum measurement of 3.0 cm.  The right common iliac artery measures 1.9 cm which is unchanged.  The left common iliac artery slightly enlarged up to 1.5 cm in maximal diameter.    This does not pose him any immediate risk, but his blood pressure is poorly controlled and he continues to smoke so he is at risk of continued growth.  At this point, I would recommend continued annual follow-up.  Smoking cessation and blood pressure control were stressed again today.

## 2019-10-22 NOTE — Patient Instructions (Signed)
Abdominal Aortic Aneurysm  An aneurysm is a bulge in one of the blood vessels that carry blood away from the heart (artery). It happens when blood pushes up against a weak or damaged place in the wall of an artery. An abdominal aortic aneurysm happens in the main artery of the body (aorta). Some aneurysms may not cause problems. If it grows, it can burst or tear, causing bleeding inside the body. This is an emergency. It needs to be treated right away. What are the causes? The exact cause of this condition is not known. What increases the risk? The following may make you more likely to get this condition:  Being a male who is 60 years of age or older.  Being white (Caucasian).  Using tobacco.  Having a family history of aneurysms.  Having the following conditions: ? Hardening of the arteries (arteriosclerosis). ? Inflammation of the walls of an artery (arteritis). ? Certain genetic conditions. ? Being very overweight (obesity). ? An infection in the wall of the aorta (infectious aortitis). ? High cholesterol. ? High blood pressure (hypertension). What are the signs or symptoms? Symptoms depend on the size of the aneurysm and how fast it is growing. Most grow slowly and do not cause any symptoms. If symptoms do occur, they may include:  Pain in the belly (abdomen), side, or back.  Feeling full after eating only small amounts of food.  Feeling a throbbing lump in the belly. Symptoms that the aneurysm has burst (ruptured) include:  Sudden, very bad pain in the belly, side, or back.  Feeling sick to your stomach (nauseous).  Throwing up (vomiting).  Feeling light-headed or passing out. How is this treated? Treatment for this condition depends on:  The size of the aneurysm.  How fast it is growing.  Your age.  Your risk of having it burst. If your aneurysm is smaller than 2 inches (5 cm), your doctor may manage it by:  Checking it often to see if it is getting bigger.  You may have an imaging test (ultrasound) to check it every 3-6 months, every year, or every few years.  Giving you medicines to: ? Control blood pressure. ? Treat pain. ? Fight infection. If your aneurysm is larger than 2 inches (5 cm), you may need surgery to fix it. Follow these instructions at home: Lifestyle  Do not use any products that have nicotine or tobacco in them. This includes cigarettes, e-cigarettes, and chewing tobacco. If you need help quitting, ask your doctor.  Get regular exercise. Ask your doctor what types of exercise are best for you. Eating and drinking  Eat a heart-healthy diet. This includes eating plenty of: ? Fresh fruits and vegetables. ? Whole grains. ? Low-fat (lean) protein. ? Low-fat dairy products.  Avoid foods that are high in saturated fat and cholesterol. These foods include red meat and some dairy products.  Do not drink alcohol if: ? Your doctor tells you not to drink. ? You are pregnant, may be pregnant, or are planning to become pregnant.  If you drink alcohol: ? Limit how much you use to:  0-1 drink a day for women.  0-2 drinks a day for men. ? Be aware of how much alcohol is in your drink. In the [place] one drink equals any of these:  One typical bottle of beer (12 oz).  One-half glass of wine (5 oz).  One shot of hard liquor (1 oz). General instructions  Take over-the-counter and prescription medicines only as   told by your doctor.  Keep your blood pressure within normal limits. Ask your doctor what your blood pressure should be.  Have your blood sugar (glucose) level and cholesterol levels checked regularly. Keep your blood sugar level and cholesterol levels within normal limits.  Avoid heavy lifting and activities that take a lot of effort. Ask your doctor what activities are safe for you.  Keep all follow-up visits as told by your doctor. This is important. ? Talk to your doctor about regular screenings to see if the  aneurysm is getting bigger. Contact a doctor if you:  Have pain in your belly, side, or back.  Have a throbbing feeling in your belly.  Have a family history of aneurysms. Get help right away if you:  Have sudden, bad pain in your belly, side, or back.  Feel sick to your stomach.  Throw up.  Have trouble pooping (constipation).  Have trouble peeing (urinating).  Feel light-headed.  Have a fast heart rate when you stand.  Have sweaty skin that is cold to the touch (clammy).  Have shortness of breath.  Have a fever. These symptoms may be an emergency. Do not wait to see if the symptoms will go away. Get medical help right away. Call your local emergency services (911 in the [place] Do not drive yourself to the hospital. Summary  An aneurysm is a bulge in one of the blood vessels that carry blood away from the heart (artery). Some aneurysms may not cause problems.  You may need to have yours checked often. If it grows, it can burst or tear. This causes bleeding inside the body. It needs to be treated right away.  Follow instructions from your doctor about healthy lifestyle changes.  Keep all follow-up visits as told by your doctor. This is important. This information is not intended to replace advice given to you by your health care provider. Make sure you discuss any questions you have with your health care provider. Document Revised: 06/11/2018 Document Reviewed: 09/30/2017 Elsevier Patient Education  2020 [employer]  

## 2020-02-03 ENCOUNTER — Other Ambulatory Visit: Payer: Self-pay

## 2020-02-19 ENCOUNTER — Ambulatory Visit: Payer: Self-pay | Admitting: Urology

## 2020-02-20 ENCOUNTER — Encounter: Payer: Self-pay | Admitting: Urology

## 2020-03-25 ENCOUNTER — Encounter: Payer: Self-pay | Admitting: Urology

## 2020-03-25 ENCOUNTER — Ambulatory Visit (INDEPENDENT_AMBULATORY_CARE_PROVIDER_SITE_OTHER): Payer: Medicare (Managed Care) | Admitting: Urology

## 2020-03-25 ENCOUNTER — Other Ambulatory Visit: Payer: Self-pay

## 2020-03-25 VITALS — BP 158/90 | HR 74 | Ht 67.0 in | Wt 188.9 lb

## 2020-03-25 DIAGNOSIS — R972 Elevated prostate specific antigen [PSA]: Secondary | ICD-10-CM | POA: Diagnosis not present

## 2020-03-25 NOTE — Patient Instructions (Addendum)
Prostate Biopsy Instructions  Stop all aspirin or blood thinners (aspirin, plavix, coumadin, warfarin, motrin, ibuprofen, advil, aleve, naproxen, naprosyn) for 7 days prior to the procedure.  If you have any questions about stopping these medications, please contact your primary care physician or cardiologist.  Having a light meal prior to the procedure is recommended.  If you are diabetic or have low blood sugar please bring a small snack or glucose tablet.  A Fleets enema is needed to be purchased over the counter at a local pharmacy and used 2 hours before you scheduled appointment.  This can be purchased over the counter at any pharmacy.  Antibiotics will be administered in the clinic at the time of the procedure unless otherwise specified.    Please bring someone with you to the procedure to drive you home.  A follow up appointment has been scheduled for you to receive the results of the biopsy.  If you have any questions or concerns, please feel free to call the office at [phone] or send a Mychart message.    Thank you, Staff at [facility] urology (11th ed., pp. [phone]). [place], [place]: Elsevier.">  Transrectal Ultrasound-Guided Prostate Biopsy This is a procedure to take samples of tissue from your prostate. Ultrasound images are used to guide the procedure. It is usually done to check for prostate cancer. What happens before the procedure? Eating and drinking restrictions Follow instructions from your doctor about what you can eat or drink. Medicines  Ask your doctor about changing or stopping: ? Your normal medicines. ? Vitamins, herbs, and supplements. ? Over-the-counter medicines.  Do not take aspirin or ibuprofen unless you are told to. General instructions  Liquid will be used to clear waste from your butt (enema).  You may have a blood sample taken.  You may have a pee (urine) sample taken.  Plan to have a  responsible adult take you home from the hospital or clinic.  If you will be going home right after the procedure, plan to have a responsible adult care for you for the time you are told. This is important.  For your safety, your doctor may: ? Ask you to wash with a soap that kills germs. ? Give you antibiotic medicine. What happens during the procedure?  You will be given one or both of these: ? A medicine to help you relax. ? A medicine to numb the area.  You will be placed on your left side. Your knees may be bent.  A probe with gel on it will be placed in your butt. Pictures will be taken of your prostate and the area around it.  Medicine will be used to numb your prostate.  A needle will be placed in your butt and moved to your prostate.  Prostate tissue will be removed.  The samples will be sent to a lab. The procedure may vary.   What happens after the procedure?  You will be watched until the medicines you were given have worn off.  You may have some pain in your butt. You will be given medicine for it.  Do not drive for 24 hours if you were given a medicine to help you relax. Summary  This procedure is usually done to check for prostate cancer.  Before the procedure, ask your doctor about changing or stopping your medicines.  You may have some pain in your butt. You will be given medicine for it.  Plan to have  a responsible adult take you home from the hospital or clinic. This information is not intended to replace advice given to you by your health care provider. Make sure you discuss any questions you have with your health care provider. Document Revised: 12/06/2019 Document Reviewed: 11/08/2019 Elsevier Patient Education  2021 [place]. Prostate Cancer Screening  Prostate cancer screening is a test that is done to check for the presence of prostate cancer in men. The prostate gland is a walnut-sized gland that is located below the bladder and in front of  the rectum in males. The function of the prostate is to add fluid to semen during ejaculation. Prostate cancer is the second most common type of cancer in men. Who should have prostate cancer screening?  Screening recommendations vary based on age and other risk factors. Screening is recommended if:  You are older than age 26. If you are age 68-69, talk with your health care provider about your need for screening and how often screening should be done. Because most prostate cancers are slow growing and will not cause death, screening is generally reserved in this age group for men who have a 10-15-year life expectancy.  You are younger than age 25, and you have these risk factors: ? Being a black male or a male of African descent. ? Having a father, brother, or uncle who has been diagnosed with prostate cancer. The risk is higher if your family member's cancer occurred at an early age. Screening is not recommended if:  You are younger than age 6.  You are between the ages of 76 and 66 and you have no risk factors.  You are 38 years of age or older. At this age, the risks that screening can cause are greater than the benefits that it may provide. If you are at high risk for prostate cancer, your health care provider may recommend that you have screenings more often or that you start screening at a younger age. How is screening for prostate cancer done? The recommended prostate cancer screening test is a blood test called the prostate-specific antigen (PSA) test. PSA is a protein that is made in the prostate. As you age, your prostate naturally produces more PSA. Abnormally high PSA levels may be caused by:  Prostate cancer.  An enlarged prostate that is not caused by cancer (benign prostatic hyperplasia, BPH). This condition is very common in older men.  A prostate gland infection (prostatitis). Depending on the PSA results, you may need more tests, such as:  A physical exam to check the  size of your prostate gland.  Blood and imaging tests.  A procedure to remove tissue samples from your prostate gland for testing (biopsy). What are the benefits of prostate cancer screening?  Screening can help to identify cancer at an early stage, before symptoms start and when the cancer can be treated more easily.  There is a small chance that screening may lower your risk of dying from prostate cancer. The chance is small because prostate cancer is a slow-growing cancer, and most men with prostate cancer die from a different cause. What are the risks of prostate cancer screening? The main risk of prostate cancer screening is diagnosing and treating prostate cancer that would never have caused any symptoms or problems. This is called overdiagnosisand overtreatment. PSA screening cannot tell you if your PSA is high due to cancer or a different cause. A prostate biopsy is the only procedure to diagnose prostate cancer. Even the results  of a biopsy may not tell you if your cancer needs to be treated. Slow-growing prostate cancer may not need any treatment other than monitoring, so diagnosing and treating it may cause unnecessary stress or other side effects. A prostate biopsy may also cause:  Infection or fever.  A false negative. This is a result that shows that you do not have prostate cancer when you actually do have prostate cancer. Questions to ask your health care provider  When should I start prostate cancer screening?  What is my risk for prostate cancer?  How often do I need screening?  What type of screening tests do I need?  How do I get my test results?  What do my results mean?  Do I need treatment? Where to find more information  The American Cancer Society: www.cancer.org  American Urological Association: www.auanet.org Contact a health care provider if:  You have difficulty urinating.  You have pain when you urinate or ejaculate.  You have blood in your  urine or semen.  You have pain in your back or in the area of your prostate. Summary  Prostate cancer is a common type of cancer in men. The prostate gland is located below the bladder and in front of the rectum. This gland adds fluid to semen during ejaculation.  Prostate cancer screening may identify cancer at an early stage, when the cancer can be treated more easily.  The prostate-specific antigen (PSA) test is the recommended screening test for prostate cancer.  Discuss the risks and benefits of prostate cancer screening with your health care provider. If you are age 62 or older, the risks that screening can cause are greater than the benefits that it may provide. This information is not intended to replace advice given to you by your health care provider. Make sure you discuss any questions you have with your health care provider. Document Revised: 06/14/2019 Document Reviewed: 10/04/2018 Elsevier Patient Education  [place].

## 2020-03-25 NOTE — Progress Notes (Signed)
03/25/20 1:23 PM   Matthew Mcgrath 01/25/1946 [id number]  CC: Elevated PSA  HPI: I saw [name] in neurology clinic today for elevated PSA.  He is a 75 year old male who is here with his sister today who helps provide much of the history.  His comorbidities include cardiomyopathy, CHF, diabetes.  He denies any family history of prostate or breast cancer.  PSA was found to be elevated at 19.1.  The only prior PSA value was 0.37 in November 2016.  He denies any significant urinary symptoms aside from some urinary frequency.  IPSS score today is 18, PVR is normal at 113 mL.  PSA 0.37 in November 2016.   PMH: Past Medical History:  Diagnosis Date  . Cardiomyopathy ([place])   . Congestive heart failure ([place])   . Diabetes mellitus type 2, controlled ([place])   . Elevated lipids   . HTN (hypertension)     Surgical History: Past Surgical History:  Procedure Laterality Date  . CARDIAC CATHETERIZATION N/A 04/14/2015   Procedure: Left Heart Cath and Coronary Angiography;  Surgeon: [name], MD;  Location: [facility];  Service: Cardiovascular;  Laterality: N/A;  . CATARACT EXTRACTION W/ INTRAOCULAR LENS  IMPLANT, BILATERAL Left   . HERNIA REPAIR    . IMPLANTABLE CARDIOVERTER DEFIBRILLATOR (ICD) GENERATOR CHANGE Left 07/03/2015   Procedure: ICD IMPLANT single chamber;  Surgeon: Marzetta Board, MD;  Location: [facility];  Service: Cardiovascular;  Laterality: Left;   Family History: Family History  Problem Relation Age of Onset  . Heart failure Mother   . Heart disease Mother   . Alcoholism Father   . Hypertension Sister   . Diabetes Sister   . Hypertension Brother   . Diabetes Brother     Social History:  reports that he has been smoking cigarettes. He has a 7.50 pack-year smoking history. He has never used smokeless tobacco. He reports current drug use. Frequency: 2.00 times per week. Drug: Marijuana. He reports that he does not drink alcohol.  Physical Exam: BP (!)  158/90 (BP Location: Left Arm, Patient Position: Sitting, Cuff Size: Large)   Pulse 74   Ht 5\' 7"  (1.702 m)   Wt 188 lb 14.4 oz (85.7 kg)   BMI 29.59 kg/m    Constitutional: Flat affect Cardiovascular: No clubbing, cyanosis, or edema. Respiratory: Normal respiratory effort, no increased work of breathing. GI: Abdomen is soft, nontender, nondistended, no abdominal masses DRE: Grossly abnormal DRE, 50 g, firm and abnormal concerning for malignancy  Laboratory Data: Reviewed, see HPI  Pertinent Imaging: None to review  Assessment & Plan:   75 year old male with elevated PSA of 19 and grossly abnormal DRE concerning for malignancy.  We reviewed the implications of an elevated PSA and the uncertainty surrounding it. In general, a man's PSA increases with age and is produced by both normal and cancerous prostate tissue. The differential diagnosis for elevated PSA includes BPH, prostate cancer, infection, recent intercourse/ejaculation, recent urethroscopic manipulation (foley placement/cystoscopy) or trauma, and prostatitis.   Management of an elevated PSA can include observation or prostate biopsy and we discussed this in detail. Our goal is to detect clinically significant prostate cancers, and manage with either active surveillance, surgery, or radiation for localized disease. Risks of prostate biopsy include bleeding, infection (including life threatening sepsis), pain, and lower urinary symptoms. Hematuria, hematospermia, and blood in the stool are all common after biopsy and can persist up to 4 weeks.   We discussed that localized prostate cancer versus  metastatic prostate cancer managed differently, and he will likely require some staging imaging to evaluate for metastatic disease if biopsy is positive.  They would like to recheck another PSA today to confirm elevation, but I was very frank that his rectal exam is concerning for malignancy, and recommended biopsy ASAP.  Schedule  prostate biopsy ASAP  Nickolas Madrid, MD 03/25/2020  [facility] [address], [place], [place] [address] [phone]

## 2020-03-26 LAB — PSA: Prostate Specific Ag, Serum: 30.4 ng/mL — ABNORMAL HIGH (ref 0.0–4.0)

## 2020-03-27 ENCOUNTER — Telehealth: Payer: Self-pay

## 2020-03-27 NOTE — Telephone Encounter (Signed)
Called pt informed him of PSA, and reiterated BX results.

## 2020-03-27 NOTE — Telephone Encounter (Signed)
Called pt's sister, no answer, unable to leave message as voicemail is not set up. Pt and sister were informed of biopsy instructions at appointment this week, given written instructions as well.

## 2020-03-27 NOTE — Telephone Encounter (Signed)
-----   Message from Billey Co, MD sent at 03/26/2020  8:14 AM EST ----- PSA is increased to 30 from 43, need prostate biopsy ASAP, this should be scheduled for next week if hasnt been put in yet, please review instructions with him again, thank you.  I think his sister helps him with medical appointments.  Nickolas Madrid, MD 03/26/2020

## 2020-04-01 ENCOUNTER — Other Ambulatory Visit: Payer: Self-pay

## 2020-04-01 ENCOUNTER — Ambulatory Visit (INDEPENDENT_AMBULATORY_CARE_PROVIDER_SITE_OTHER): Payer: Medicare (Managed Care) | Admitting: Urology

## 2020-04-01 VITALS — BP 171/93 | HR 72 | Ht 67.0 in | Wt 190.0 lb

## 2020-04-01 DIAGNOSIS — R972 Elevated prostate specific antigen [PSA]: Secondary | ICD-10-CM | POA: Diagnosis not present

## 2020-04-01 MED ORDER — LEVOFLOXACIN 500 MG PO TABS
500.0000 mg | ORAL_TABLET | Freq: Once | ORAL | Status: AC
  Administered 2020-04-01: 500 mg via ORAL

## 2020-04-01 MED ORDER — GENTAMICIN SULFATE 40 MG/ML IJ SOLN
80.0000 mg | Freq: Once | INTRAMUSCULAR | Status: AC
  Administered 2020-04-01: 80 mg via INTRAMUSCULAR

## 2020-04-01 NOTE — Patient Instructions (Signed)

## 2020-04-01 NOTE — Progress Notes (Signed)
   04/01/20  Indication: Elevated PSA, 30.4  Prostate Biopsy Procedure   Informed consent was obtained, and we discussed the risks of bleeding and infection/sepsis. A time out was performed to ensure correct patient identity.  Pre-Procedure: - Last PSA Level: 30.4 - Gentamicin and levaquin given for antibiotic prophylaxis - Transrectal Ultrasound performed revealing a 56 gm prostate, PSA density 0.54 - Prostate grossly abnormal throughout with irregular borders and hypoechoic regions.  Challenging biopsy secondary to abnormal planes.  Procedure: - Prostate block performed using 10 cc 1% lidocaine and biopsies taken from sextant areas, a total of 12 under ultrasound guidance.  Post-Procedure: - Patient tolerated the procedure well - He was counseled to seek immediate medical attention if experiences significant bleeding, fevers, or severe pain - Return in one week to discuss biopsy results  Assessment/ Plan: Will follow up in 1-2 weeks to discuss pathology High suspicion for prostate cancer and need for staging imaging  Nickolas Madrid, MD 04/01/2020

## 2020-04-06 ENCOUNTER — Other Ambulatory Visit
Admission: RE | Admit: 2020-04-06 | Discharge: 2020-04-06 | Disposition: A | Discharge status: Home / Self Care | Payer: Medicare (Managed Care) | Source: Ambulatory Visit | Attending: Internal Medicine | Admitting: Internal Medicine

## 2020-04-06 ENCOUNTER — Other Ambulatory Visit: Payer: Self-pay

## 2020-04-06 DIAGNOSIS — Z20822 Contact with and (suspected) exposure to covid-19: Secondary | ICD-10-CM | POA: Diagnosis not present

## 2020-04-06 DIAGNOSIS — Z01812 Encounter for preprocedural laboratory examination: Secondary | ICD-10-CM | POA: Insufficient documentation

## 2020-04-06 LAB — SARS CORONAVIRUS 2 (TAT 6-24 HRS): SARS Coronavirus 2: NEGATIVE

## 2020-04-06 LAB — SURGICAL PATHOLOGY

## 2020-04-08 ENCOUNTER — Other Ambulatory Visit: Payer: Self-pay

## 2020-04-08 ENCOUNTER — Ambulatory Visit
Admission: RE | Admit: 2020-04-08 | Discharge: 2020-04-08 | Disposition: A | Discharge status: Home / Self Care | Payer: Medicare (Managed Care) | Attending: Internal Medicine | Admitting: Internal Medicine

## 2020-04-08 ENCOUNTER — Encounter: Payer: Self-pay | Admitting: Internal Medicine

## 2020-04-08 ENCOUNTER — Ambulatory Visit: Payer: Medicare (Managed Care) | Admitting: Anesthesiology

## 2020-04-08 ENCOUNTER — Encounter
Admission: RE | Disposition: A | Discharge status: Home / Self Care | Payer: Self-pay | Source: Home / Self Care | Attending: Internal Medicine

## 2020-04-08 DIAGNOSIS — K640 First degree hemorrhoids: Secondary | ICD-10-CM | POA: Diagnosis not present

## 2020-04-08 DIAGNOSIS — I509 Heart failure, unspecified: Secondary | ICD-10-CM | POA: Diagnosis not present

## 2020-04-08 DIAGNOSIS — E119 Type 2 diabetes mellitus without complications: Secondary | ICD-10-CM | POA: Diagnosis not present

## 2020-04-08 DIAGNOSIS — Z7982 Long term (current) use of aspirin: Secondary | ICD-10-CM | POA: Diagnosis not present

## 2020-04-08 DIAGNOSIS — Z7984 Long term (current) use of oral hypoglycemic drugs: Secondary | ICD-10-CM | POA: Diagnosis not present

## 2020-04-08 DIAGNOSIS — I429 Cardiomyopathy, unspecified: Secondary | ICD-10-CM | POA: Insufficient documentation

## 2020-04-08 DIAGNOSIS — Z1211 Encounter for screening for malignant neoplasm of colon: Secondary | ICD-10-CM | POA: Insufficient documentation

## 2020-04-08 DIAGNOSIS — Z79899 Other long term (current) drug therapy: Secondary | ICD-10-CM | POA: Insufficient documentation

## 2020-04-08 DIAGNOSIS — I110 Hypertensive heart disease with heart failure: Secondary | ICD-10-CM | POA: Insufficient documentation

## 2020-04-08 DIAGNOSIS — Z800 Family history of malignant neoplasm of digestive organs: Secondary | ICD-10-CM | POA: Diagnosis not present

## 2020-04-08 HISTORY — PX: COLONOSCOPY WITH PROPOFOL: SHX5780

## 2020-04-08 LAB — GLUCOSE, CAPILLARY: Glucose-Capillary: 235 mg/dL — ABNORMAL HIGH (ref 70–99)

## 2020-04-08 SURGERY — COLONOSCOPY WITH PROPOFOL
Anesthesia: General

## 2020-04-08 MED ORDER — PHENYLEPHRINE HCL (PRESSORS) 10 MG/ML IV SOLN
INTRAVENOUS | Status: DC | PRN
  Administered 2020-04-08 (×2): 100 ug via INTRAVENOUS

## 2020-04-08 MED ORDER — LIDOCAINE HCL (CARDIAC) PF 100 MG/5ML IV SOSY
PREFILLED_SYRINGE | INTRAVENOUS | Status: DC | PRN
  Administered 2020-04-08: 100 mg via INTRAVENOUS

## 2020-04-08 MED ORDER — PROPOFOL 500 MG/50ML IV EMUL
INTRAVENOUS | Status: DC | PRN
  Administered 2020-04-08: 165 ug/kg/min via INTRAVENOUS

## 2020-04-08 MED ORDER — PROPOFOL 10 MG/ML IV BOLUS
INTRAVENOUS | Status: DC | PRN
  Administered 2020-04-08: 50 mg via INTRAVENOUS
  Administered 2020-04-08: 10 mg via INTRAVENOUS

## 2020-04-08 MED ORDER — SODIUM CHLORIDE 0.9 % IV SOLN: INTRAVENOUS | Status: DC

## 2020-04-08 NOTE — Op Note (Signed)
[facility] Patient Name: Matthew Mcgrath Procedure Date: 04/08/2020 9:25 AM MRN: [id number] Account #: [id number] Date of Birth: 07-03-1945 Admit Type: Outpatient Age: 75 Room: [facility] ENDO ROOM 2 Gender: Male Note Status: Finalized Procedure:             Colonoscopy Indications:           Screening patient at increased risk: Family history of                         1st-degree relative with colorectal cancer at age 41                         years (or older) Providers:             Briann Sarchet K. Christopher Glasscock MD, MD Medicines:             Propofol per Anesthesia Complications:         No immediate complications. Procedure:             Pre-Anesthesia Assessment:                        - The risks and benefits of the procedure and the                         sedation options and risks were discussed with the                         patient. All questions were answered and informed                         consent was obtained.                        - Patient identification and proposed procedure were                         verified prior to the procedure by the nurse. The                         procedure was verified in the procedure room.                        - ASA Grade Assessment: III - A patient with severe                         systemic disease.                        - After reviewing the risks and benefits, the patient                         was deemed in satisfactory condition to undergo the                         procedure.                        After obtaining informed consent, the colonoscope was  passed under direct vision. Throughout the procedure,                         the patient's blood pressure, pulse, and oxygen                         saturations were monitored continuously. The                         Colonoscope was introduced through the anus and                         advanced to the the cecum, identified  by appendiceal                         orifice and ileocecal valve. The colonoscopy was                         performed without difficulty. The patient tolerated                         the procedure well. The quality of the bowel                         preparation was adequate. The ileocecal valve,                         appendiceal orifice, and rectum were photographed. Findings:      The perianal and digital rectal examinations were normal. Pertinent       negatives include normal sphincter tone and no palpable rectal lesions.      Non-bleeding internal hemorrhoids were found during retroflexion. The       hemorrhoids were Grade I (internal hemorrhoids that do not prolapse).      The colon (entire examined portion) appeared normal. Impression:            - Non-bleeding internal hemorrhoids.                        - The entire examined colon is normal.                        - No specimens collected. Recommendation:        - Patient has a contact number available for                         emergencies. The signs and symptoms of potential                         delayed complications were discussed with the patient.                         Return to normal activities tomorrow. Written                         discharge instructions were provided to the patient.                        - Resume previous diet.                        -  Continue present medications.                        - Repeat colonoscopy in 5 years for screening purposes.                        - Return to GI office PRN.                        - The findings and recommendations were discussed with                         the patient and their family. Procedure Code(s):     --- Professional ---                        X7262, Colorectal cancer screening; colonoscopy on                         individual at high risk Diagnosis Code(s):     --- Professional ---                        K64.0, First degree hemorrhoids                         Z80.0, Family history of malignant neoplasm of                         digestive organs CPT copyright 2019 American Medical Association. All rights reserved. The codes documented in this report are preliminary and upon coder review may  be revised to meet current compliance requirements. Efrain Sella MD, MD 04/08/2020 9:48:18 AM This report has been signed electronically. Number of Addenda: 0 Note Initiated On: 04/08/2020 9:25 AM Scope Withdrawal Time: 0 hours 8 minutes 41 seconds  Total Procedure Duration: 0 hours 12 minutes 19 seconds  Estimated Blood Loss:  Estimated blood loss: none.      [facility]

## 2020-04-08 NOTE — Anesthesia Preprocedure Evaluation (Signed)
Anesthesia Evaluation  Patient identified by MRN, date of birth, ID band Patient awake    Reviewed: Allergy & Precautions, H&P , NPO status , Patient's Chart, lab work & pertinent test results  Airway Mallampati: II       Dental  (+) Missing, Poor Dentition   Pulmonary shortness of breath, Current Smoker and Patient abstained from smoking.,    Pulmonary exam normal breath sounds clear to auscultation       Cardiovascular hypertension, +CHF  Normal cardiovascular exam Rhythm:Regular Rate:Normal     Neuro/Psych negative neurological ROS  negative psych ROS   GI/Hepatic negative GI ROS, Neg liver ROS,   Endo/Other  negative endocrine ROSdiabetes  Renal/GU negative Renal ROS  negative genitourinary   Musculoskeletal negative musculoskeletal ROS (+)   Abdominal   Peds negative pediatric ROS (+)  Hematology negative hematology ROS (+)   Anesthesia Other Findings Past Medical History: No date: Cardiomyopathy ([place]) No date: Congestive heart failure ([facility]) No date: Diabetes mellitus type 2, controlled ([facility]) No date: Elevated lipids No date: HTN (hypertension)   Reproductive/Obstetrics negative OB ROS                             Anesthesia Physical Anesthesia Plan  ASA: III  Anesthesia Plan: General   Post-op Pain Management:    Induction: Intravenous  PONV Risk Score and Plan: 1 and Propofol infusion  Airway Management Planned: Natural Airway and Nasal Cannula  Additional Equipment:   Intra-op Plan:   Post-operative Plan:   Informed Consent: I have reviewed the patients History and Physical, chart, labs and discussed the procedure including the risks, benefits and alternatives for the proposed anesthesia with the patient or authorized representative who has indicated his/her understanding and acceptance.     Dental advisory given  Plan Discussed with: CRNA, Anesthesiologist  and Surgeon  Anesthesia Plan Comments:         Anesthesia Quick Evaluation

## 2020-04-08 NOTE — Anesthesia Postprocedure Evaluation (Signed)
Anesthesia Post Note  Patient: Matthew Mcgrath  Procedure(s) Performed: COLONOSCOPY WITH PROPOFOL (N/A )  Patient location during evaluation: Endoscopy Anesthesia Type: General Level of consciousness: awake Pain management: pain level controlled Vital Signs Assessment: post-procedure vital signs reviewed and stable Respiratory status: spontaneous breathing Cardiovascular status: stable Postop Assessment: no apparent nausea or vomiting Anesthetic complications: no   No complications documented.   Last Vitals:  Vitals:   04/08/20 0949 04/08/20 0959  BP: (!) 96/54 108/70  Pulse: 63 66  Resp: 20 (!) 27  Temp: (!) 35.9 C   SpO2: 100% 97%    Last Pain:  Vitals:   04/08/20 0959  TempSrc:   PainSc: 0-No pain                 Neva Seat

## 2020-04-08 NOTE — H&P (Signed)
Outpatient short stay form Pre-procedure 04/08/2020 8:34 AM Shaida Route K. Alice Reichert, M.D.  Primary Physician: Leward Quan  Reason for visit:  Family history of colon cancer - Brother  History of present illness:  75 year old patient presenting for family history of colon cancer. Patient denies any change in bowel habits, rectal bleeding or involuntary weight loss.     Current Facility-Administered Medications:  .  0.9 %  sodium chloride infusion, , Intravenous, Continuous, Brae Gartman, Benay Pike, MD  Medications Prior to Admission  Medication Sig Dispense Refill Last Dose  . acetaminophen (TYLENOL) 650 MG CR tablet Take 650 mg by mouth every 8 (eight) hours as needed for pain.   Past Week at Unknown time  . carvedilol (COREG) 25 MG tablet Take 25 mg by mouth 2 (two) times daily with a meal.   04/07/2020 at Unknown time  . cholecalciferol (VITAMIN D3) 25 MCG (1000 UNIT) tablet Take 1,000 Units by mouth daily.   Past Month at Unknown time  . Docosahexaenoic Acid (DHA OMEGA 3 PO) 1,000 mg.   Past Month at Unknown time  . furosemide (LASIX) 40 MG tablet Take 40 mg by mouth daily.    04/07/2020 at Unknown time  . lisinopril (ZESTRIL) 10 MG tablet Take 10 mg by mouth daily.    04/07/2020 at Unknown time  . magnesium oxide (MAG-OX) 400 MG tablet Take 400 mg by mouth 2 (two) times daily.   Past Week at Unknown time  . metFORMIN (GLUCOPHAGE) 500 MG tablet Take by mouth 2 (two) times daily with a meal.   Past Week at Unknown time  . Na Sulfate-K Sulfate-Mg Sulf 17.5-3.13-1.6 GM/177ML SOLN Take by mouth.   Past Month at Unknown time  . potassium chloride (K-DUR,KLOR-CON) 10 MEQ tablet Take 20 mEq by mouth daily.    Past Month at Unknown time  . potassium chloride (KLOR-CON) 10 MEQ tablet Take 10 mEq by mouth daily.   Past Month at Unknown time  . simvastatin (ZOCOR) 40 MG tablet Take 40 mg by mouth daily at 6 PM.    Past Week at Unknown time  . spironolactone (ALDACTONE) 25 MG tablet Take 25 mg by mouth daily.   Past  Week at Unknown time  . aspirin 81 MG tablet Take 81 mg by mouth daily.     Marland Kitchen glipiZIDE (GLUCOTROL) 5 MG tablet Take by mouth daily before breakfast.     . glucose blood (TRUE METRIX BLOOD GLUCOSE TEST) test strip 1 each by Other route as needed for other. Use as instructed     . MIRALAX 17 GM/SCOOP powder Take 17 g by mouth daily.     . Multiple Vitamin (MULTIVITAMIN ADULT PO) SMARTSIG:1 Tablet(s) By Mouth Daily     . Sodium Phosphates (FLEET ENEMA) ENEM Insert 1 enema into rectum single dose  two hours before prostate biopsy on Wed 04/01/20     . TINACTIN 1 % AERO Spray 2X daily between toes of both feet x 1 month STOP THIS MED 03/20/20     . TRUEplus Lancets 28G MISC by Does not apply route.        No Known Allergies   Past Medical History:  Diagnosis Date  . Cardiomyopathy ([place])   . Congestive heart failure ([place])   . Diabetes mellitus type 2, controlled ([place])   . Elevated lipids   . HTN (hypertension)     Review of systems:  Otherwise negative.    Physical Exam  Gen: Alert, oriented. Appears stated age.  HEENT: [place]/AT. PERRLA. Lungs: CTA, no wheezes. CV: RR nl S1, S2. Abd: soft, benign, no masses. BS+ Ext: No edema. Pulses 2+    Planned procedures: Proceed with colonoscopy. The patient understands the nature of the planned procedure, indications, risks, alternatives and potential complications including but not limited to bleeding, infection, perforation, damage to internal organs and possible oversedation/side effects from anesthesia. The patient agrees and gives consent to proceed.  Please refer to procedure notes for findings, recommendations and patient disposition/instructions.     Indigo Barbian K. Alice Reichert, M.D. Gastroenterology 04/08/2020  8:34 AM

## 2020-04-08 NOTE — Transfer of Care (Signed)
Immediate Anesthesia Transfer of Care Note  Patient: Matthew Mcgrath  Procedure(s) Performed: COLONOSCOPY WITH PROPOFOL (N/A )  Patient Location: Endoscopy Unit  Anesthesia Type:General  Level of Consciousness: drowsy and patient cooperative  Airway & Oxygen Therapy: Patient Spontanous Breathing and Patient connected to face mask oxygen  Post-op Assessment: Report given to RN and Post -op Vital signs reviewed and stable  Post vital signs: Reviewed and stable  Last Vitals:  Vitals Value Taken Time  BP 96/54 04/08/20 0949  Temp 35.9 C 04/08/20 0949  Pulse 64 04/08/20 0950  Resp 21 04/08/20 0950  SpO2 100 % 04/08/20 0950  Vitals shown include unvalidated device data.  Last Pain:  Vitals:   04/08/20 0949  TempSrc: Temporal  PainSc: Asleep         Complications: No complications documented.

## 2020-04-08 NOTE — Addendum Note (Signed)
Addendum  created 04/08/20 1020 by Kelton Pillar, CRNA   Child order released for a procedure order, Clinical Note Signed, Flowsheet accepted, Intraprocedure Blocks edited

## 2020-04-08 NOTE — Interval H&P Note (Signed)
History and Physical Interval Note:  04/08/2020 8:35 AM  Matthew Mcgrath  has presented today for surgery, with the diagnosis of FAMILY HX.OF COLON CANCER.  The various methods of treatment have been discussed with the patient and family. After consideration of risks, benefits and other options for treatment, the patient has consented to  Procedure(s): COLONOSCOPY WITH PROPOFOL (N/A) as a surgical intervention.  The patient's history has been reviewed, patient examined, no change in status, stable for surgery.  I have reviewed the patient's chart and labs.  Questions were answered to the patient's satisfaction.     [place], [place]

## 2020-04-08 NOTE — Anesthesia Procedure Notes (Signed)
Procedure Name: General with mask airway °Performed by: Fletcher-Harrison, Leonna Schlee, CRNA °Pre-anesthesia Checklist: Patient identified, Emergency Drugs available, Patient being monitored and Suction available °Patient Re-evaluated:Patient Re-evaluated prior to induction °Oxygen Delivery Method: Simple face mask °Induction Type: IV induction °Placement Confirmation: positive ETCO2 and CO2 detector °Dental Injury: Teeth and Oropharynx as per pre-operative assessment  ° ° ° ° ° ° °

## 2020-04-09 ENCOUNTER — Encounter: Payer: Self-pay | Admitting: Internal Medicine

## 2020-04-16 ENCOUNTER — Ambulatory Visit (INDEPENDENT_AMBULATORY_CARE_PROVIDER_SITE_OTHER): Payer: Medicare (Managed Care) | Admitting: Urology

## 2020-04-16 ENCOUNTER — Encounter: Payer: Self-pay | Admitting: Urology

## 2020-04-16 ENCOUNTER — Other Ambulatory Visit: Payer: Self-pay

## 2020-04-16 VITALS — BP 158/91 | HR 71 | Ht 65.0 in | Wt 196.0 lb

## 2020-04-16 DIAGNOSIS — C61 Malignant neoplasm of prostate: Secondary | ICD-10-CM | POA: Diagnosis not present

## 2020-04-16 NOTE — Progress Notes (Signed)
   04/16/2020 4:52 PM   Matthew Mcgrath 14-Jul-1945 [id number]  Reason for visit: Discuss prostate biopsy results  HPI: I saw Matthew Mcgrath and his sister who serves as his caretaker in clinic today to review his prostate biopsy results.  He was found to have a PSA of 30 with a grossly abnormal DRE and underwent prostate biopsy on 04/01/2020 that showed a 56 g prostate with PSA density of 0.54 and appeared grossly abnormal with irregular borders on ultrasound.  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  We had a long conversation about his new diagnosis of high risk prostate cancer, and the need for staging imaging.  We discussed that localized prostate cancer versus metastatic prostate cancer treated very differently.  We briefly reviewed different treatment strategies including chemotherapy, hormone treatment, radiation, or surgery.  I think with his age, congestive heart failure, and frailty, surgery would not be a good option even if he has found to have localized disease.  We discussed possible need for referral to medical oncology and/or radiation oncology pending staging imaging findings.  I have reached out to his provider at [facility], and updated her as well with these findings.  Bone scan and CT abdomen pelvis with contrast to evaluate for metastatic disease, call with results  Billey Co, MD  [facility] [address] [place], [place] [address] [phone]

## 2020-04-16 NOTE — Patient Instructions (Signed)
The radiology department will call you to schedule a CT scan and bone scan to look for any prostate cancer spread outside of the prostate.  This is very important, because prostate cancer is treated differently if it is just in the prostate, versus if it has spread to the lymph nodes or bone.  Prostate Cancer  The prostate is a male gland that helps make semen. It is located below a man's bladder, in front of the rectum. Prostate cancer is when abnormal cells grow in this gland. What are the causes? The cause of this condition is not known. What increases the risk? You are more likely to develop this condition if:  You are 73 years of age or older.  You are African American.  You have a family history of prostate cancer.  You have a family history of breast cancer. What are the signs or symptoms? Symptoms of this condition include:  A need to pee often.  Peeing that is weak, or pee that stops and starts.  Trouble starting or stopping your pee.  Inability to pee.  Blood in your pee or semen.  Pain in the lower back, lower belly (abdomen), hips, or upper thighs.  Trouble getting an erection.  Trouble emptying all of your pee. How is this treated? Treatment for this condition depends on your age, your health, the kind of treatment you like, and how far the cancer has spread. Treatments include:  Being watched. This is called observation. You will be tested from time to time, but you will not get treated. Tests are to make sure that the cancer is not growing.  Surgery. This may be done to remove the prostate, to remove the testicles, or to freeze or kill cancer cells.  Radiation. This uses a strong beam to kill cancer cells.  Ultrasound energy. This uses strong sound waves to kill cancer cells.  Chemotherapy. This uses medicines that stop cancer cells from increasing. This kills cancer cells and healthy cells.  Targeted therapy. This kills cancer cells only. Healthy cells  are not affected.  Hormone treatment. This stops the body from making hormones that help the cancer cells to grow. Follow these instructions at home:  Take over-the-counter and prescription medicines only as told by your doctor.  Eat a healthy diet.  Get plenty of sleep.  Ask your doctor for help to find a support group for men with prostate cancer.  If you have to go to the hospital, let your cancer doctor (oncologist) know.  Treatment may affect your ability to have sex. Touch, hold, hug, and caress your partner to have intimate moments.  Keep all follow-up visits as told by your doctor. This is important. Contact a doctor if:  You have new or more trouble peeing.  You have new or more blood in your pee.  You have new or more pain in your hips, back, or chest. Get help right away if:  You have weakness in your legs.  You lose feeling in your legs.  You cannot control your pee or your poop (stool).  You have chills or a fever. Summary  The prostate is a male gland that helps make semen.  Prostate cancer is when abnormal cells grow in this gland.  Treatment includes doing surgery, using medicines, using very strong beams, or watching without treatment.  Ask your doctor for help to find a support group for men with prostate cancer.  Contact a doctor if you have problems peeing or have any  new pain that you did not have before. This information is not intended to replace advice given to you by your health care provider. Make sure you discuss any questions you have with your health care provider. Document Revised: 02/05/2019 Document Reviewed: 02/05/2019 Elsevier Patient Education  2021 [employer].

## 2020-04-17 ENCOUNTER — Other Ambulatory Visit: Payer: Self-pay | Admitting: Family Medicine

## 2020-04-21 ENCOUNTER — Ambulatory Visit
Admission: RE | Admit: 2020-04-21 | Discharge: 2020-04-21 | Disposition: A | Discharge status: Home / Self Care | Payer: Medicare (Managed Care) | Source: Ambulatory Visit | Attending: Urology | Admitting: Urology

## 2020-04-21 ENCOUNTER — Other Ambulatory Visit: Payer: Self-pay

## 2020-04-21 DIAGNOSIS — C61 Malignant neoplasm of prostate: Secondary | ICD-10-CM | POA: Insufficient documentation

## 2020-04-21 LAB — POCT I-STAT CREATININE: Creatinine, Ser: 0.8 mg/dL (ref 0.61–1.24)

## 2020-04-21 MED ORDER — IOHEXOL 300 MG/ML  SOLN
100.0000 mL | Freq: Once | INTRAMUSCULAR | Status: AC | PRN
  Administered 2020-04-21: 100 mL via INTRAVENOUS

## 2020-04-27 ENCOUNTER — Other Ambulatory Visit: Payer: Self-pay

## 2020-04-27 ENCOUNTER — Encounter
Admission: RE | Admit: 2020-04-27 | Discharge: 2020-04-27 | Disposition: A | Discharge status: Home / Self Care | Payer: Medicare (Managed Care) | Source: Ambulatory Visit | Attending: Urology | Admitting: Urology

## 2020-04-27 DIAGNOSIS — C61 Malignant neoplasm of prostate: Secondary | ICD-10-CM | POA: Insufficient documentation

## 2020-04-27 MED ORDER — TECHNETIUM TC 99M MEDRONATE IV KIT
20.0000 | PACK | Freq: Once | INTRAVENOUS | Status: AC | PRN
  Administered 2020-04-27: 21.584 via INTRAVENOUS

## 2020-05-04 ENCOUNTER — Telehealth: Payer: Self-pay | Admitting: Urology

## 2020-05-04 ENCOUNTER — Telehealth: Payer: Self-pay | Admitting: Family Medicine

## 2020-05-04 NOTE — Telephone Encounter (Signed)
PCP from [facility] spoke to patient and she is going to see him on Wednesday. She states she has made the appropriate referrals.

## 2020-05-04 NOTE — Telephone Encounter (Signed)
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  He has mild urinary symptoms of urinary frequency at baseline.  His sister facilitates most of his health care.  I was unable to reach her multiple times last week to discuss these results over the phone.  I was able to touch base with his provider at [facility], and she will reach out to his sister as well, and place referral to oncology.  Recommend ADT and consideration of local radiation as well, will arrange urology follow-up in 3 months after treatment initiated with oncology.  I also messaged Dr. Tasia Catchings via secure chat regarding his case.  Nickolas Madrid, MD 05/04/2020]

## 2020-05-11 ENCOUNTER — Inpatient Hospital Stay: Payer: Medicare (Managed Care) | Attending: Oncology | Admitting: Oncology

## 2020-05-11 ENCOUNTER — Encounter: Payer: Self-pay | Admitting: Oncology

## 2020-05-11 ENCOUNTER — Inpatient Hospital Stay: Payer: Medicare (Managed Care)

## 2020-05-11 ENCOUNTER — Other Ambulatory Visit: Payer: Self-pay

## 2020-05-11 ENCOUNTER — Other Ambulatory Visit: Payer: Self-pay | Admitting: Oncology

## 2020-05-11 VITALS — BP 132/78 | HR 79 | Temp 96.5°F | Resp 18 | Ht 65.0 in | Wt 193.0 lb

## 2020-05-11 DIAGNOSIS — Z7189 Other specified counseling: Secondary | ICD-10-CM | POA: Diagnosis not present

## 2020-05-11 DIAGNOSIS — M545 Low back pain, unspecified: Secondary | ICD-10-CM | POA: Insufficient documentation

## 2020-05-11 DIAGNOSIS — Z79818 Long term (current) use of other agents affecting estrogen receptors and estrogen levels: Secondary | ICD-10-CM | POA: Insufficient documentation

## 2020-05-11 DIAGNOSIS — C7951 Secondary malignant neoplasm of bone: Secondary | ICD-10-CM | POA: Diagnosis not present

## 2020-05-11 DIAGNOSIS — Z79899 Other long term (current) drug therapy: Secondary | ICD-10-CM | POA: Insufficient documentation

## 2020-05-11 DIAGNOSIS — E119 Type 2 diabetes mellitus without complications: Secondary | ICD-10-CM | POA: Diagnosis not present

## 2020-05-11 DIAGNOSIS — I509 Heart failure, unspecified: Secondary | ICD-10-CM | POA: Diagnosis not present

## 2020-05-11 DIAGNOSIS — C61 Malignant neoplasm of prostate: Secondary | ICD-10-CM

## 2020-05-11 DIAGNOSIS — I110 Hypertensive heart disease with heart failure: Secondary | ICD-10-CM | POA: Diagnosis not present

## 2020-05-11 NOTE — Progress Notes (Signed)
Patient here to establish oncology care. Pt accompanied by sister, Holley Raring, today.

## 2020-05-11 NOTE — Progress Notes (Signed)
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 Patient was accompanied by her sister who he currently lives with.  History was mainly obtained from sister as well as reviewing medical records.  Patient is a poor historian. Per sister, she has noticed patient having increased urgency and frequency of urination.  Patient was evaluated by urologist Dr. Diamantina Providence in January 2022.  PSA was elevated at 30.4 in January 2022 04/01/2020, prostate biopsy showed  All cores were positive for high risk prostate cancer, primarily Gleason score 5+4 = 9, with max core involvement of 100% and perineural invasion present. 04/21/2020, CT abdomen pelvis showed a sclerotic lesion in the lumbar spine concerning for probable metastatic disease.  No definitive extra skeletal metastatic disease noted elsewhere in the abdomen or pelvis.  Mild circumferential bladder wall thickening.  Aortic atherosclerosis. 04/27/2020, bone scan showed foci of abnormal uptake noted in the mid thoracic and upper lumbar spine concerning for metastatic disease.  Patient reports some lower back pain intermittently.  He currently lives with his sister.   Past medical history includes CHF, hypertension, hyper lipidemia, diabetes type 2 Per sister, family history is positive for colon cancer and another brother, multiple myeloma in the other brother.  Review of Systems   Constitutional: Negative for appetite change, chills, fatigue, fever and unexpected weight change.  HENT:   Negative for hearing loss and voice change.   Eyes: Negative for eye problems and icterus.  Respiratory: Negative for chest tightness, cough and shortness of breath.   Cardiovascular: Negative for chest pain and leg swelling.  Gastrointestinal: Negative for abdominal distention and abdominal pain.  Endocrine: Negative for hot flashes.  Genitourinary: Positive for frequency. Negative for difficulty urinating and dysuria.   Musculoskeletal: Positive for back pain. Negative for arthralgias.  Skin: Negative for itching and rash.  Neurological: Negative for light-headedness and numbness.  Hematological: Negative for adenopathy. Does not bruise/bleed easily.  Psychiatric/Behavioral: Negative for confusion.    MEDICAL HISTORY:  Past Medical History:  Diagnosis Date  . Cardiomyopathy ([place])   . Congestive heart failure ([place])   . Diabetes mellitus type 2, controlled ([place])   . Elevated lipids   . HTN (hypertension)     SURGICAL HISTORY: Past Surgical History:  Procedure Laterality Date  . CARDIAC CATHETERIZATION N/A 04/14/2015   Procedure: Left Heart Cath and Coronary Angiography;  Surgeon: [name], MD;  Location: [facility];  Service: Cardiovascular;  Laterality: N/A;  . CATARACT EXTRACTION W/ INTRAOCULAR LENS  IMPLANT, BILATERAL Left   . COLONOSCOPY WITH PROPOFOL N/A 04/08/2020   Procedure: COLONOSCOPY WITH PROPOFOL;  Surgeon: Toledo, Benay Pike, MD;  Location: [facility];  Service: Gastroenterology;  Laterality: N/A;  . HERNIA REPAIR    . IMPLANTABLE CARDIOVERTER DEFIBRILLATOR (ICD) GENERATOR CHANGE Left 07/03/2015   Procedure: ICD IMPLANT single chamber;  Surgeon: Marzetta Board, MD;  Location: [facility];  Service: Cardiovascular;  Laterality: Left;    SOCIAL HISTORY: Social History   Socioeconomic History  .  Marital status: Single    Spouse name: Not on  file  . Number of children: Not on file  . Years of education: Not on file  . Highest education level: Not on file  Occupational History  . Not on file  Tobacco Use  . Smoking status: Former Smoker    Packs/day: 0.25    Years: 30.00    Pack years: 7.50    Types: Cigarettes    Quit date: 02/11/2020    Years since quitting: 0.2  . Smokeless tobacco: Never Used  Vaping Use  . Vaping Use: Never used  Substance and Sexual Activity  . Alcohol use: No  . Drug use: Not Currently    Frequency: 2.0 times per week    Types: Marijuana    Comment: past use  . Sexual activity: Not on file  Other Topics Concern  . Not on file  Social History Narrative  . Not on file   Social Determinants of Health   Financial Resource Strain: Not on file  Food Insecurity: Not on file  Transportation Needs: Not on file  Physical Activity: Not on file  Stress: Not on file  Social Connections: Not on file  Intimate Partner Violence: Not on file    FAMILY HISTORY: Family History  Problem Relation Age of Onset  . Heart failure Mother   . Heart disease Mother   . Alcoholism Father   . Hypertension Sister   . Diabetes Sister   . Hypertension Brother   . Diabetes Brother   . Colon cancer Brother   . Multiple myeloma Brother     ALLERGIES:  has No Known Allergies.  MEDICATIONS:  Current Outpatient Medications  Medication Sig Dispense Refill  . acetaminophen (TYLENOL) 650 MG CR tablet Take 650 mg by mouth every 8 (eight) hours as needed for pain.    Marland Kitchen aspirin 81 MG tablet Take 81 mg by mouth daily.    . carvedilol (COREG) 25 MG tablet Take 25 mg by mouth 2 (two) times daily with a meal.    . cholecalciferol (VITAMIN D3) 25 MCG (1000 UNIT) tablet Take 1,000 Units by mouth daily.    . Docosahexaenoic Acid (DHA OMEGA 3 PO) 1,000 mg.    . furosemide (LASIX) 40 MG tablet Take 40 mg by mouth daily.     Marland Kitchen glipiZIDE (GLUCOTROL) 5 MG tablet Take by mouth daily before breakfast.    . glucose blood (TRUE  METRIX BLOOD GLUCOSE TEST) test strip 1 each by Other route as needed for other. Use as instructed    . lisinopril (ZESTRIL) 10 MG tablet Take 10 mg by mouth daily.     . magnesium oxide (MAG-OX) 400 MG tablet Take 400 mg by mouth 2 (two) times daily.    . metFORMIN (GLUCOPHAGE) 500 MG tablet Take by mouth 2 (two) times daily with a meal.    . MIRALAX 17 GM/SCOOP powder Take 17 g by mouth daily.    . Multiple Vitamin (MULTIVITAMIN ADULT PO) SMARTSIG:1 Tablet(s) By Mouth Daily    . Na Sulfate-K Sulfate-Mg Sulf 17.5-3.13-1.6 GM/177ML SOLN Take by mouth.    . simvastatin (ZOCOR) 40 MG tablet Take 40 mg by mouth daily at 6 PM.     . spironolactone (ALDACTONE) 25 MG tablet Take 25 mg by mouth daily.    Marland Kitchen TINACTIN 1 % AERO Spray 2X daily between toes of both feet x 1 month STOP THIS MED 03/20/20    . TRUEplus Lancets 28G MISC by Does  not apply route.    Marland Kitchen LIDODERM 5 % Apply 1 patch to skin every morning as needed for L lower back pain. Remove after 12 hours. Call for refills (Patient not taking: Reported on 05/11/2020)    . potassium chloride (K-DUR,KLOR-CON) 10 MEQ tablet Take 20 mEq by mouth daily.     . potassium chloride (KLOR-CON) 10 MEQ tablet Take 10 mEq by mouth daily.    . Sodium Phosphates (FLEET ENEMA) ENEM Insert 1 enema into rectum single dose  two hours before prostate biopsy on Wed 04/01/20 (Patient not taking: Reported on 05/11/2020)     No current facility-administered medications for this visit.     PHYSICAL EXAMINATION: ECOG PERFORMANCE STATUS: 1 - Symptomatic but completely ambulatory Vitals:   05/11/20 1050  BP: 132/78  Pulse: 79  Resp: 18  Temp: (!) 96.5 F (35.8 C)   Filed Weights   05/11/20 1050  Weight: 193 lb (87.5 kg)    Physical Exam Constitutional:      General: He is not in acute distress. HENT:     Head: Normocephalic and atraumatic.  Eyes:     General: No scleral icterus. Cardiovascular:     Rate and Rhythm: Normal rate and regular rhythm.     Heart  sounds: Normal heart sounds.  Pulmonary:     Effort: Pulmonary effort is normal. No respiratory distress.     Breath sounds: No wheezing.  Abdominal:     General: Bowel sounds are normal. There is no distension.     Palpations: Abdomen is soft.  Musculoskeletal:        General: No deformity. Normal range of motion.     Cervical back: Normal range of motion and neck supple.  Skin:    General: Skin is warm and dry.     Findings: No erythema or rash.  Neurological:     Mental Status: He is alert and oriented to person, place, and time. Mental status is at baseline.     Cranial Nerves: No cranial nerve deficit.     Coordination: Coordination normal.  Psychiatric:        Mood and Affect: Mood normal.     LABORATORY DATA:  I have reviewed the data as listed Lab Results  Component Value Date   WBC 5.7 06/23/2015   HGB 12.9 (L) 06/23/2015   HCT 39.2 (L) 06/23/2015   MCV 96.1 06/23/2015   PLT 174 06/23/2015   Recent Labs    04/21/20 0803  CREATININE 0.80   Iron/TIBC/Ferritin/ %Sat No results found for: IRON, TIBC, FERRITIN, IRONPCTSAT    RADIOGRAPHIC STUDIES: I have personally reviewed the radiological images as listed and agreed with the findings in the report. NM Bone Scan Whole Body  Result Date: 04/27/2020 CLINICAL DATA:  Prostate cancer. EXAM: NUCLEAR MEDICINE WHOLE BODY BONE SCAN TECHNIQUE: Whole body anterior and posterior images were obtained approximately 3 hours after intravenous injection of radiopharmaceutical. RADIOPHARMACEUTICALS:  21.584 mCi Technetium-38mMDP IV COMPARISON:  April 21, 2020. FINDINGS: Focus of abnormal uptake is seen involving both big toes consistent with degenerative change. Focus of abnormal uptake is seen at approximately the L2 level concerning for metastatic disease. This corresponds to abnormality seen on prior CT. There is also noted focus of abnormal uptake involving midthoracic spine concerning for metastatic disease. IMPRESSION: Foci  of abnormal uptake are noted in midthoracic and upper lumbar spine concerning for metastatic disease. Electronically Signed   By: JMarijo ConceptionM.D.   On: 04/27/2020 13:22  CT Abdomen Pelvis W Contrast  Result Date: 04/21/2020 CLINICAL DATA:  75 year old male with history of prostate cancer. Staging examination. EXAM: CT ABDOMEN AND PELVIS WITH CONTRAST TECHNIQUE: Multidetector CT imaging of the abdomen and pelvis was performed using the standard protocol following bolus administration of intravenous contrast. CONTRAST:  150m OMNIPAQUE IOHEXOL 300 MG/ML  SOLN COMPARISON:  No priors. FINDINGS: Lower chest: Atherosclerotic calcifications in the descending thoracic aorta as well as the left anterior descending, left circumflex and right coronary arteries. Pacemaker/AICD lead terminating in the right ventricle near the apex. Hepatobiliary: No suspicious cystic or solid hepatic lesions. No intra or extrahepatic biliary ductal dilatation. Gallbladder is normal in appearance. Pancreas: No pancreatic mass. No pancreatic ductal dilatation. No pancreatic or peripancreatic fluid collections or inflammatory changes. Spleen: Unremarkable. Adrenals/Urinary Tract: Subcentimeter low-attenuation lesion in the upper pole of the left kidney, too small to characterize, but statistically likely to represent a cyst. Right kidney and bilateral adrenal glands are normal in appearance. No hydroureteronephrosis. Mild circumferential urinary bladder wall thickening, without discrete mass. Stomach/Bowel: The appearance of the stomach is normal. There is no pathologic dilatation of small bowel or colon. Normal appendix. Vascular/Lymphatic: Aortic atherosclerosis with aneurysmal dilatation of the common iliac arteries bilaterally measuring up to 1.9 cm on the right and 1.6 cm on the left. 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 No lymphadenopathy noted in the  abdomen or pelvis. Reproductive: Prostate gland and seminal vesicles are grossly unremarkable in appearance. Other: No significant volume of ascites.  No pneumoperitoneum. Musculoskeletal: There is several sclerotic lesions are noted throughout the visualized skeleton, largest of which is in the L2 vertebral body measuring up to 1.7 cm in diameter (sagittal image 68 of series 6). IMPRESSION: 1. Sclerotic lesions in the lumbar spine concerning for probable metastatic disease. 2. No definite extra skeletal metastatic disease noted elsewhere in the abdomen or pelvis. 3. Mild circumferential bladder wall thickening which could be reflective of chronic bladder outlet obstruction. No discrete bladder wall mass confidently identified at this time. 4. Aortic atherosclerosis, in addition to at least 3 vessel coronary artery disease. 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 Electronically Signed   By: DVinnie LangtonM.D.   On: 04/21/2020 08:53      ASSESSMENT & PLAN:  1. Goals of care, counseling/discussion   2. Prostate cancer metastatic to bone ([facility]    #Metastatic prostate cancer to bone. Image findings were reviewed and discussed with patient. Diagnosis of prostate cancer, stage IV was discussed. The diagnosis and care plan were discussed with patient in detail.  NCCN guidelines were reviewed and shared with patient.   The goal of treatment which is to palliate disease, disease related symptoms, improve quality of life and hopefully prolong life was highlighted in our discussion.  We reviewed the rationale and potential side effects of androgen deprivation therapy with FMills Kollerfirst followed by Lupron every 3 months.  Awaiting insurance approval.  Patient hopefully will start first injection this week.  Discussed about the rationale of adding additional treatments to ADT.  With patient's age, medical problems, I think is reasonable to  choose oral agent.  We will further discuss with him at the next visit.  Supportive care measures are necessary for patient well-being and will be provided as necessary. We spent sufficient time to discuss many aspect of care, questions were answered to patient's satisfaction.  All questions were answered. The patient knows to call the clinic with any problems questions or concerns. cc  Marnee Guarneri, MD    Return of visit: Mills Koller this week.  4 week lab MD Lupron Thank you for this kind referral and the opportunity to participate in the care of this patient. A copy of today's note is routed to referring provider    [name], MD, PhD Hematology Oncology [facility] Pager- [phone] 05/11/2020

## 2020-05-12 ENCOUNTER — Telehealth: Payer: Self-pay

## 2020-05-12 ENCOUNTER — Ambulatory Visit: Payer: Medicare (Managed Care)

## 2020-05-12 DIAGNOSIS — C61 Malignant neoplasm of prostate: Secondary | ICD-10-CM

## 2020-05-12 NOTE — Telephone Encounter (Signed)
Done..  I contacted [facility] and a detailed message was left on the scheduling coordinator VM Making them aware that labs were added on to pts 05/14/20 appts for 12:45 and Inj changed from 1:00 to 1:30. Pts sister Holley Raring was also made aware.

## 2020-05-12 NOTE — Telephone Encounter (Signed)
Contacted [facility] nurse and pt has not had any recent cbc,cmp. Will add to visit on 3/10 when he comes for firmagon.

## 2020-05-12 NOTE — Telephone Encounter (Signed)
-----   Message from [name], MD sent at 05/11/2020  4:57 PM EST ----- I forget to check cbc cmp. Please check if he has any recent labs done at [facility]. If no, please add cbc cmp lab encounter to the same days when he comes for firtmagon.thanks.

## 2020-05-14 ENCOUNTER — Inpatient Hospital Stay: Payer: Medicare (Managed Care)

## 2020-05-14 DIAGNOSIS — C7951 Secondary malignant neoplasm of bone: Secondary | ICD-10-CM

## 2020-05-14 DIAGNOSIS — C61 Malignant neoplasm of prostate: Secondary | ICD-10-CM

## 2020-05-14 LAB — CBC WITH DIFFERENTIAL/PLATELET
Abs Immature Granulocytes: 0.04 10*3/uL (ref 0.00–0.07)
Basophils Absolute: 0 10*3/uL (ref 0.0–0.1)
Basophils Relative: 1 %
Eosinophils Absolute: 0.1 10*3/uL (ref 0.0–0.5)
Eosinophils Relative: 2 %
HCT: 39.2 % (ref 39.0–52.0)
Hemoglobin: 12.6 g/dL — ABNORMAL LOW (ref 13.0–17.0)
Immature Granulocytes: 1 %
Lymphocytes Relative: 32 %
Lymphs Abs: 1.7 10*3/uL (ref 0.7–4.0)
MCH: 30.7 pg (ref 26.0–34.0)
MCHC: 32.1 g/dL (ref 30.0–36.0)
MCV: 95.4 fL (ref 80.0–100.0)
Monocytes Absolute: 0.4 10*3/uL (ref 0.1–1.0)
Monocytes Relative: 8 %
Neutro Abs: 3.1 10*3/uL (ref 1.7–7.7)
Neutrophils Relative %: 56 %
Platelets: 187 10*3/uL (ref 150–400)
RBC: 4.11 MIL/uL — ABNORMAL LOW (ref 4.22–5.81)
RDW: 12.2 % (ref 11.5–15.5)
WBC: 5.4 10*3/uL (ref 4.0–10.5)
nRBC: 0 % (ref 0.0–0.2)

## 2020-05-14 LAB — COMPREHENSIVE METABOLIC PANEL
ALT: 19 U/L (ref 0–44)
AST: 21 U/L (ref 15–41)
Albumin: 3.9 g/dL (ref 3.5–5.0)
Alkaline Phosphatase: 49 U/L (ref 38–126)
Anion gap: 8 (ref 5–15)
BUN: 17 mg/dL (ref 8–23)
CO2: 27 mmol/L (ref 22–32)
Calcium: 9.3 mg/dL (ref 8.9–10.3)
Chloride: 102 mmol/L (ref 98–111)
Creatinine, Ser: 0.86 mg/dL (ref 0.61–1.24)
GFR, Estimated: >60 mL/min (ref >60)
Glucose, Bld: 264 mg/dL — ABNORMAL HIGH (ref 70–99)
Potassium: 4.6 mmol/L (ref 3.5–5.1)
Sodium: 137 mmol/L (ref 135–145)
Total Bilirubin: 0.8 mg/dL (ref 0.3–1.2)
Total Protein: 7.1 g/dL (ref 6.5–8.1)

## 2020-05-14 MED ORDER — DEGARELIX ACETATE(240 MG DOSE) 120 MG/VIAL ~~LOC~~ SOLR
240.0000 mg | Freq: Once | SUBCUTANEOUS | Status: AC
Start: 2020-05-14 — End: 2020-05-14
  Administered 2020-05-14: 240 mg via SUBCUTANEOUS
  Filled 2020-05-14: qty 6

## 2020-05-28 DIAGNOSIS — C61 Malignant neoplasm of prostate: Secondary | ICD-10-CM | POA: Insufficient documentation

## 2020-05-28 DIAGNOSIS — C7951 Secondary malignant neoplasm of bone: Secondary | ICD-10-CM | POA: Insufficient documentation

## 2020-06-01 ENCOUNTER — Ambulatory Visit: Payer: Medicare (Managed Care) | Admitting: Urology

## 2020-06-09 ENCOUNTER — Telehealth: Payer: Self-pay

## 2020-06-09 NOTE — Telephone Encounter (Signed)
Dr. Tasia Catchings received a message from Radford Pax, MD a geriatrician/[facility] provider with [facility] that [facility] will need to provide [name] and leuprolide and will be administered with [facility].   Dr. Tasia Catchings informed Dr. Wilford Mcgrath of patient's plan to receive lupron or Eligard 45mg  every 24 weeks, due on 4/7.   Also received a message from [facility] that they would like to draw labs with them instead at the [facility].  Dr. Tasia Catchings is OK with that but would prefer patient to have labs drawn prior to appt date and results be faxed for availability to discuss with patient at MD appt.  Left message with Kia at [phone] ext [phone] to give me a call back with the contact information of the person I need to speak to regarding lab work and specifics of injection protocol to be given with [facility].  Will get the Lupron appt for 4/7 cancelled at the [facility].

## 2020-06-10 NOTE — Telephone Encounter (Signed)
Dr. Wilford Grist left a voicemail stating she may be reached via community message in [place].  Community message forwarded to Dr. Wilford Grist  asking when labs can be drawn.

## 2020-06-11 ENCOUNTER — Inpatient Hospital Stay (HOSPITAL_BASED_OUTPATIENT_CLINIC_OR_DEPARTMENT_OTHER): Payer: Medicare (Managed Care) | Admitting: Oncology

## 2020-06-11 ENCOUNTER — Inpatient Hospital Stay: Payer: Medicare (Managed Care) | Attending: Oncology

## 2020-06-11 ENCOUNTER — Encounter: Payer: Self-pay | Admitting: Oncology

## 2020-06-11 ENCOUNTER — Inpatient Hospital Stay: Payer: Medicare (Managed Care)

## 2020-06-11 VITALS — BP 144/82 | HR 66 | Temp 96.0°F | Resp 18 | Wt 191.9 lb

## 2020-06-11 DIAGNOSIS — Z7984 Long term (current) use of oral hypoglycemic drugs: Secondary | ICD-10-CM | POA: Insufficient documentation

## 2020-06-11 DIAGNOSIS — Z87891 Personal history of nicotine dependence: Secondary | ICD-10-CM | POA: Diagnosis not present

## 2020-06-11 DIAGNOSIS — I509 Heart failure, unspecified: Secondary | ICD-10-CM | POA: Insufficient documentation

## 2020-06-11 DIAGNOSIS — E119 Type 2 diabetes mellitus without complications: Secondary | ICD-10-CM | POA: Insufficient documentation

## 2020-06-11 DIAGNOSIS — Z7189 Other specified counseling: Secondary | ICD-10-CM | POA: Diagnosis not present

## 2020-06-11 DIAGNOSIS — I700 Atherosclerosis of aorta: Secondary | ICD-10-CM | POA: Insufficient documentation

## 2020-06-11 DIAGNOSIS — E785 Hyperlipidemia, unspecified: Secondary | ICD-10-CM | POA: Insufficient documentation

## 2020-06-11 DIAGNOSIS — M545 Low back pain, unspecified: Secondary | ICD-10-CM | POA: Insufficient documentation

## 2020-06-11 DIAGNOSIS — I110 Hypertensive heart disease with heart failure: Secondary | ICD-10-CM | POA: Diagnosis not present

## 2020-06-11 DIAGNOSIS — C61 Malignant neoplasm of prostate: Secondary | ICD-10-CM

## 2020-06-11 DIAGNOSIS — Z5111 Encounter for antineoplastic chemotherapy: Secondary | ICD-10-CM

## 2020-06-11 DIAGNOSIS — R350 Frequency of micturition: Secondary | ICD-10-CM | POA: Insufficient documentation

## 2020-06-11 DIAGNOSIS — C7951 Secondary malignant neoplasm of bone: Secondary | ICD-10-CM

## 2020-06-11 DIAGNOSIS — Z7982 Long term (current) use of aspirin: Secondary | ICD-10-CM | POA: Diagnosis not present

## 2020-06-11 DIAGNOSIS — Z79899 Other long term (current) drug therapy: Secondary | ICD-10-CM | POA: Diagnosis not present

## 2020-06-11 LAB — COMPREHENSIVE METABOLIC PANEL
ALT: 14 U/L (ref 0–44)
AST: 19 U/L (ref 15–41)
Albumin: 3.9 g/dL (ref 3.5–5.0)
Alkaline Phosphatase: 54 U/L (ref 38–126)
Anion gap: 9 (ref 5–15)
BUN: 19 mg/dL (ref 8–23)
CO2: 26 mmol/L (ref 22–32)
Calcium: 9.4 mg/dL (ref 8.9–10.3)
Chloride: 101 mmol/L (ref 98–111)
Creatinine, Ser: 0.83 mg/dL (ref 0.61–1.24)
GFR, Estimated: >60 mL/min (ref >60)
Glucose, Bld: 239 mg/dL — ABNORMAL HIGH (ref 70–99)
Potassium: 4.8 mmol/L (ref 3.5–5.1)
Sodium: 136 mmol/L (ref 135–145)
Total Bilirubin: 0.7 mg/dL (ref 0.3–1.2)
Total Protein: 7.3 g/dL (ref 6.5–8.1)

## 2020-06-11 LAB — CBC WITH DIFFERENTIAL/PLATELET
Abs Immature Granulocytes: 0.01 10*3/uL (ref 0.00–0.07)
Basophils Absolute: 0 10*3/uL (ref 0.0–0.1)
Basophils Relative: 1 %
Eosinophils Absolute: 0.1 10*3/uL (ref 0.0–0.5)
Eosinophils Relative: 3 %
HCT: 37.9 % — ABNORMAL LOW (ref 39.0–52.0)
Hemoglobin: 12.2 g/dL — ABNORMAL LOW (ref 13.0–17.0)
Immature Granulocytes: 0 %
Lymphocytes Relative: 31 %
Lymphs Abs: 1.6 10*3/uL (ref 0.7–4.0)
MCH: 30.4 pg (ref 26.0–34.0)
MCHC: 32.2 g/dL (ref 30.0–36.0)
MCV: 94.5 fL (ref 80.0–100.0)
Monocytes Absolute: 0.5 10*3/uL (ref 0.1–1.0)
Monocytes Relative: 9 %
Neutro Abs: 2.9 10*3/uL (ref 1.7–7.7)
Neutrophils Relative %: 56 %
Platelets: 176 10*3/uL (ref 150–400)
RBC: 4.01 MIL/uL — ABNORMAL LOW (ref 4.22–5.81)
RDW: 12 % (ref 11.5–15.5)
WBC: 5 10*3/uL (ref 4.0–10.5)
nRBC: 0 % (ref 0.0–0.2)

## 2020-06-11 LAB — PSA: Prostatic Specific Antigen: 4.27 ng/mL — ABNORMAL HIGH (ref 0.00–4.00)

## 2020-06-11 NOTE — Telephone Encounter (Signed)
Community message received from Dr. Wilford Grist and forwarded to Dr. Tasia Catchings that labs were drawn yesterday.  The lab results were also included in the message due to their fax being down at this time.

## 2020-06-11 NOTE — Progress Notes (Signed)
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 Patient was accompanied by her sister who he currently lives with.  History was mainly obtained from sister as well as reviewing medical records.  Patient is a poor historian. Per sister, she has noticed patient having increased urgency and frequency of urination.  Patient was evaluated by urologist Dr. Diamantina Providence in January 2022.  PSA was elevated at 30.4 in January 2022 04/01/2020, prostate biopsy showed  All cores were positive for high risk prostate cancer, primarily Gleason score 5+4 = 9, with max core involvement of 100% and perineural invasion present. 04/21/2020, CT abdomen pelvis showed a sclerotic lesion in the lumbar spine concerning for probable metastatic disease.  No definitive extra skeletal metastatic disease noted elsewhere in the abdomen or pelvis.  Mild circumferential bladder wall thickening.  Aortic atherosclerosis. 04/27/2020, bone scan showed foci of abnormal uptake noted in the mid thoracic and upper lumbar spine concerning for metastatic disease.  Patient reports some lower back pain intermittently.  He currently lives with his sister.   Past medical history includes CHF, hypertension, hyper lipidemia, diabetes type 2 Per sister, family history is positive for colon cancer and another brother, multiple myeloma in the other brother.  04/16/2020  Firmagon 240 mg loading dose  INTERVAL HISTORY Matthew Mcgrath is a 75 y.o. male who has above history reviewed by me today presents for follow up visit for management of metastatic prostate cancer Problems and complaints are listed below: During the interval, patient went to [facility] for [facility] opinion was seen by [name] who agrees with continuing ADT with switch to Eligard 45 mg.  He is due to proceed with the treatments today.  Communicated with [facility] program [name], patient will receive treatment at [place]. [name] also recommends to start darolutamide and authorization process has been initiated at [facility].  Patient reports no new complaints.  Intermittent back pain which is chronic.  He was accompanied by wife.  Review of Systems  Constitutional: Negative for appetite change, chills, fatigue, fever and unexpected weight change.  HENT:   Negative for hearing loss and voice change.   Eyes: Negative for eye problems and icterus.  Respiratory: Negative for chest tightness, cough and shortness of breath.   Cardiovascular: Negative for chest pain and leg swelling.  Gastrointestinal: Negative for abdominal distention and abdominal pain.  Endocrine: Negative for hot flashes.  Genitourinary: Negative for difficulty urinating, dysuria and frequency.   Musculoskeletal: Positive for back pain. Negative for arthralgias.  Skin: Negative for itching and rash.  Neurological: Negative for light-headedness and numbness.  Hematological: Negative for adenopathy. Does not bruise/bleed easily.  Psychiatric/Behavioral: Negative for confusion.    MEDICAL HISTORY:  Past Medical History:  Diagnosis Date  . Cardiomyopathy ([place])   . Congestive heart failure ([place])   . Diabetes mellitus type 2, controlled ([place])   . Elevated lipids   . HTN (hypertension)     SURGICAL HISTORY: Past Surgical History:  Procedure Laterality Date  .  CARDIAC CATHETERIZATION N/A 04/14/2015   Procedure: Left Heart Cath and Coronary  Angiography;  Surgeon: [name], MD;  Location: [place] CV LAB;  Service: Cardiovascular;  Laterality: N/A;  . CATARACT EXTRACTION W/ INTRAOCULAR LENS  IMPLANT, BILATERAL Left   . COLONOSCOPY WITH PROPOFOL N/A 04/08/2020   Procedure: COLONOSCOPY WITH PROPOFOL;  Surgeon: Toledo, Benay Pike, MD;  Location: [facility] ENDOSCOPY;  Service: Gastroenterology;  Laterality: N/A;  . HERNIA REPAIR    . IMPLANTABLE CARDIOVERTER DEFIBRILLATOR (ICD) GENERATOR CHANGE Left 07/03/2015   Procedure: ICD IMPLANT single chamber;  Surgeon: Marzetta Board, MD;  Location: [facility] ORS;  Service: Cardiovascular;  Laterality: Left;    SOCIAL HISTORY: Social History   Socioeconomic History  . Marital status: Single    Spouse name: Not on file  . Number of children: Not on file  . Years of education: Not on file  . Highest education level: Not on file  Occupational History  . Not on file  Tobacco Use  . Smoking status: Former Smoker    Packs/day: 0.25    Years: 30.00    Pack years: 7.50    Types: Cigarettes    Quit date: 02/11/2020    Years since quitting: 0.3  . Smokeless tobacco: Never Used  Vaping Use  . Vaping Use: Never used  Substance and Sexual Activity  . Alcohol use: No  . Drug use: Not Currently    Frequency: 2.0 times per week    Types: Marijuana    Comment: past use  . Sexual activity: Not on file  Other Topics Concern  . Not on file  Social History Narrative  . Not on file   Social Determinants of Health   Financial Resource Strain: Not on file  Food Insecurity: Not on file  Transportation Needs: Not on file  Physical Activity: Not on file  Stress: Not on file  Social Connections: Not on file  Intimate Partner Violence: Not on file    FAMILY HISTORY: Family History  Problem Relation Age of Onset  . Heart failure Mother   . Heart disease Mother   . Alcoholism Father   . Hypertension Sister   . Diabetes Sister   . Hypertension Brother   . Diabetes Brother   . Colon  cancer Brother   . Multiple myeloma Brother     ALLERGIES:  has No Known Allergies.  MEDICATIONS:  Current Outpatient Medications  Medication Sig Dispense Refill  . acetaminophen (TYLENOL) 650 MG CR tablet Take 650 mg by mouth every 8 (eight) hours as needed for pain.    Marland Kitchen aspirin 81 MG tablet Take 81 mg by mouth daily.    . carvedilol (COREG) 25 MG tablet Take 25 mg by mouth 2 (two) times daily with a meal.    . cholecalciferol (VITAMIN D3) 25 MCG (1000 UNIT) tablet Take 1,000 Units by mouth daily.    . Docosahexaenoic Acid (DHA OMEGA 3 PO) 1,000 mg.    . furosemide (LASIX) 40 MG tablet Take 40 mg by mouth daily.     Marland Kitchen glipiZIDE (GLUCOTROL) 5 MG tablet Take by mouth daily before breakfast.    . glucose blood (TRUE METRIX BLOOD GLUCOSE TEST) test strip 1 each by Other route as needed for other. Use as instructed    . lisinopril (ZESTRIL) 10 MG tablet Take 10 mg by mouth daily.     . magnesium oxide (MAG-OX) 400 MG tablet Take 400 mg by mouth 2 (two) times daily.    . metFORMIN (  GLUCOPHAGE) 500 MG tablet Take by mouth 2 (two) times daily with a meal.    . MIRALAX 17 GM/SCOOP powder Take 17 g by mouth daily.    . Multiple Vitamin (MULTIVITAMIN ADULT PO) SMARTSIG:1 Tablet(s) By Mouth Daily    . potassium chloride (KLOR-CON) 10 MEQ tablet Take 10 mEq by mouth daily.    . simvastatin (ZOCOR) 40 MG tablet Take 40 mg by mouth daily at 6 PM.     . spironolactone (ALDACTONE) 25 MG tablet Take 25 mg by mouth daily.    . TRUEplus Lancets 28G MISC by Does not apply route.    Marland Kitchen LIDODERM 5 % Apply 1 patch to skin every morning as needed for L lower back pain. Remove after 12 hours. Call for refills (Patient not taking: No sig reported)     No current facility-administered medications for this visit.     PHYSICAL EXAMINATION: ECOG PERFORMANCE STATUS: 1 - Symptomatic but completely ambulatory Vitals:   06/11/20 1017  BP: (!) 144/82  Pulse: 66  Resp: 18  Temp: (!) 96 F (35.6 C)  SpO2: 98%    Filed Weights   06/11/20 1017  Weight: 191 lb 14.4 oz (87 kg)    Physical Exam Constitutional:      General: He is not in acute distress. HENT:     Head: Normocephalic and atraumatic.  Eyes:     General: No scleral icterus. Cardiovascular:     Rate and Rhythm: Normal rate and regular rhythm.     Heart sounds: Normal heart sounds.  Pulmonary:     Effort: Pulmonary effort is normal. No respiratory distress.     Breath sounds: No wheezing.  Abdominal:     General: Bowel sounds are normal. There is no distension.     Palpations: Abdomen is soft.  Musculoskeletal:        General: No deformity. Normal range of motion.     Cervical back: Normal range of motion and neck supple.  Skin:    General: Skin is warm and dry.     Findings: No erythema or rash.  Neurological:     Mental Status: He is alert and oriented to person, place, and time. Mental status is at baseline.     Cranial Nerves: No cranial nerve deficit.     Coordination: Coordination normal.  Psychiatric:        Mood and Affect: Mood normal.     LABORATORY DATA:  I have reviewed the data as listed Lab Results  Component Value Date   WBC 5.0 06/11/2020   HGB 12.2 (L) 06/11/2020   HCT 37.9 (L) 06/11/2020   MCV 94.5 06/11/2020   PLT 176 06/11/2020   Recent Labs    04/21/20 0803 05/14/20 1247 06/11/20 0939  NA  --  137 136  K  --  4.6 4.8  CL  --  102 101  CO2  --  27 26  GLUCOSE  --  264* 239*  BUN  --  17 19  CREATININE 0.80 0.86 0.83  CALCIUM  --  9.3 9.4  GFRNONAA  --  >60 >60  PROT  --  7.1 7.3  ALBUMIN  --  3.9 3.9  AST  --  21 19  ALT  --  19 14  ALKPHOS  --  49 54  BILITOT  --  0.8 0.7   Iron/TIBC/Ferritin/ %Sat No results found for: IRON, TIBC, FERRITIN, IRONPCTSAT   Tests done at [facility] 06/10/20  Tests: (1) Comp.  Metabolic Panel (14) (631497)  Glucose       [H] 220 mg/dL          65-99  BUN            18 mg/dL          8-27  Creatinine         0.84 mg/dL         0.76-1.27  eGFR           93 mL/min/1.73       >59  BUN/Creatinine Ratio   21             10-24  Sodium          136 mmol/L         134-144  Potassium         4.9 mmol/L         3.5-5.2  Chloride         98 mmol/L          96-106  Carbon Dioxide, Total             [L] 19 mmol/L          20-29  Calcium          9.8 mg/dL          8.6-10.2  Protein, Total      7.0 g/dL          6.0-8.5  Albumin          4.4 g/dL          3.7-4.7  Globulin, Total      2.6 g/dL          1.5-4.5  A/G Ratio         1.7             1.2-2.2  Bilirubin, Total     0.4 mg/dL          0.0-1.2  Alkaline Phosphatase   65 IU/L           44-121  AST (SGOT)        15 IU/L           0-40  ALT (SGPT)        13 IU/L           0-44   Tests: (1) CBC With Differential/Platelet (005009)  WBC            4.9 x10E3/uL        3.4-10.8  RBC         [L] 4.12 x10E6/uL        4.14-5.80  Hemoglobin      [L] 12.5 g/dL          13.0-17.7  Hematocrit        38.7 %           37.5-51.0  MCV            94 fL            79-97  MCH            30.3 pg           26.6-33.0  MCHC           32.3 g/dL          31.5-35.7  RDW            11.6 %           11.6-15.4  Platelets  200 x10E3/uL        150-450  Neutrophils        55 %            Not Estab.  Lymphs          32 %            Not Estab.  Monocytes         9 %             Not Estab.  Eos            3 %              Not Estab.  Basos           1 %             Not Estab.  Neutrophils (Absolute)               2.8 x10E3/uL        1.4-7.0  Lymphs (Absolute)     1.6 x10E3/uL        0.7-3.1  Monocytes(Absolute)    0.4 x10E3/uL        0.1-0.9  Eos (Absolute)      0.1 x10E3/uL        0.0-0.4  Baso (Absolute)      0.0 x10E3/uL        0.0-0.2  Immature Granulocytes               0 %             Not Estab.  Immature Grans (Abs)   0.0 x10E3/uL        0.0-0.1    PSA 4.8   RADIOGRAPHIC STUDIES: I have personally reviewed the radiological images as listed and agreed with the findings in the report. NM Bone Scan Whole Body  Result Date: 04/27/2020 CLINICAL DATA:  Prostate cancer. EXAM: NUCLEAR MEDICINE WHOLE BODY BONE SCAN TECHNIQUE: Whole body anterior and posterior images were obtained approximately 3 hours after intravenous injection of radiopharmaceutical. RADIOPHARMACEUTICALS:  21.584 mCi Technetium-14mMDP IV COMPARISON:  April 21, 2020. FINDINGS: Focus of abnormal uptake is seen involving both big toes consistent with degenerative change. Focus of abnormal uptake is seen at approximately the L2 level concerning for metastatic disease. This corresponds to abnormality seen on prior CT. There is also noted focus of abnormal uptake involving midthoracic spine concerning for metastatic disease. IMPRESSION: Foci of abnormal uptake are noted in midthoracic and upper lumbar spine concerning for metastatic disease. Electronically Signed   By: JMarijo ConceptionM.D.   On: 04/27/2020 13:22   CT Abdomen Pelvis W Contrast  Result Date: 04/21/2020 CLINICAL DATA:  75year old male with history of prostate cancer. Staging examination. EXAM: CT ABDOMEN AND PELVIS WITH CONTRAST TECHNIQUE: Multidetector CT imaging of the abdomen and pelvis was performed using the  standard protocol following bolus administration of intravenous contrast. CONTRAST:  1034mOMNIPAQUE IOHEXOL 300 MG/ML  SOLN COMPARISON:  No priors. FINDINGS: Lower chest: Atherosclerotic calcifications in the descending thoracic aorta as well as the left anterior descending, left circumflex and right coronary arteries. Pacemaker/AICD lead terminating in the right ventricle near the apex. Hepatobiliary: No suspicious cystic or solid hepatic lesions. No intra or extrahepatic biliary ductal dilatation. Gallbladder is normal in appearance. Pancreas: No pancreatic mass. No pancreatic ductal dilatation. No pancreatic or peripancreatic fluid collections or inflammatory changes. Spleen: Unremarkable. Adrenals/Urinary Tract: Subcentimeter low-attenuation lesion in the upper pole of the left kidney, too  small to characterize, but statistically likely to represent a cyst. Right kidney and bilateral adrenal glands are normal in appearance. No hydroureteronephrosis. Mild circumferential urinary bladder wall thickening, without discrete mass. Stomach/Bowel: The appearance of the stomach is normal. There is no pathologic dilatation of small bowel or colon. Normal appendix. Vascular/Lymphatic: Aortic atherosclerosis with aneurysmal dilatation of the common iliac arteries bilaterally measuring up to 1.9 cm on the right and 1.6 cm on the left. 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 No lymphadenopathy noted in the abdomen or pelvis. Reproductive: Prostate gland and seminal vesicles are grossly unremarkable in appearance. Other: No significant volume of ascites.  No pneumoperitoneum. Musculoskeletal: There is several sclerotic lesions are noted throughout the visualized skeleton, largest of which is in the L2 vertebral body measuring up to 1.7 cm in diameter (sagittal image 68 of series 6). IMPRESSION: 1. Sclerotic lesions in the lumbar spine concerning  for probable metastatic disease. 2. No definite extra skeletal metastatic disease noted elsewhere in the abdomen or pelvis. 3. Mild circumferential bladder wall thickening which could be reflective of chronic bladder outlet obstruction. No discrete bladder wall mass confidently identified at this time. 4. Aortic atherosclerosis, in addition to at least 3 vessel coronary artery disease. 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 Electronically Signed   By: Vinnie Langton M.D.   On: 04/21/2020 08:53      ASSESSMENT & PLAN:  1. Goals of care, counseling/discussion   2. Prostate cancer ([place])   3. Metastatic cancer to bone [facility])   Cancer Staging Prostate cancer [facility]) Staging form: Prostate, AJCC 8th Edition - Clinical stage from 05/11/2020: Stage IVB (cTX, cN0, cM1, PSA: 30) - Signed by [name], MD on 05/11/2020   #Metastatic prostate cancer to bone. Continue androgen deprivation therapy.  Eligard 45 mg Q6 months with his PCP at [place] with adding AR targeted agents.  [facility] has already initiated the authorization process for darolutamide.  #I recommend somatic tumor sequencing.  We will send NGS #Low volume disease, refer to radiation oncology for consideration of radiation of the bone disease and radiation to the prostate.  Patient agrees with the plan  #Genetic testing is recommended.  He does not have an appointment scheduled at this point as we do not know when will he receives darolutamide 600 mg bid. I asked patient to call me and update me the start date of the medication and I will see patient 2 weeks after the start of the medication.  I advised patient to proceed when he receives the medication. I will help to refill if the patient decides to continue his follow-up with me in the future. Patient knows to call [facility] and have CBC, CMP, PSA done prior to his next visit with me.    Supportive care measures are necessary  for patient well-being and will be provided as necessary. We spent sufficient time to discuss many aspect of care, questions were answered to patient's satisfaction.   All questions were answered. The patient knows to call the clinic with any problems questions or concerns. cc  [facility], [place]    Return of visit: To be determined Thank you for this kind referral and the opportunity to participate in the care of this patient. A copy of today's note is routed to referring provider    [name], MD, PhD Hematology Oncology [facility]  [place] at [place]- [phone] 06/11/2020

## 2020-06-15 ENCOUNTER — Telehealth: Payer: Self-pay

## 2020-06-15 NOTE — Telephone Encounter (Signed)
Community message sent to Dr. Wilford Grist on 06/12/20 and she is ok to proceed with NGS testing.   Omniseq test request faxed to [facility] path on 4/8 on prostate biopsy.   Specimen [phone].

## 2020-06-15 NOTE — Telephone Encounter (Signed)
-----   Message from [name], MD sent at 06/11/2020  4:15 PM EDT ----- Please send NGS testing on his prostate caner biopsy specimen.. ([facility] program patient, does that change the process?)

## 2020-06-18 ENCOUNTER — Ambulatory Visit
Admission: RE | Admit: 2020-06-18 | Discharge: 2020-06-18 | Disposition: A | Discharge status: Home / Self Care | Payer: Medicare (Managed Care) | Source: Ambulatory Visit | Attending: Radiation Oncology | Admitting: Radiation Oncology

## 2020-06-18 ENCOUNTER — Encounter: Payer: Self-pay | Admitting: Radiation Oncology

## 2020-06-18 VITALS — BP 154/84 | HR 80 | Temp 96.0°F | Wt 192.8 lb

## 2020-06-18 DIAGNOSIS — I429 Cardiomyopathy, unspecified: Secondary | ICD-10-CM | POA: Diagnosis not present

## 2020-06-18 DIAGNOSIS — Z7982 Long term (current) use of aspirin: Secondary | ICD-10-CM | POA: Insufficient documentation

## 2020-06-18 DIAGNOSIS — Z7984 Long term (current) use of oral hypoglycemic drugs: Secondary | ICD-10-CM | POA: Insufficient documentation

## 2020-06-18 DIAGNOSIS — E119 Type 2 diabetes mellitus without complications: Secondary | ICD-10-CM | POA: Insufficient documentation

## 2020-06-18 DIAGNOSIS — C7951 Secondary malignant neoplasm of bone: Secondary | ICD-10-CM | POA: Diagnosis present

## 2020-06-18 DIAGNOSIS — Z800 Family history of malignant neoplasm of digestive organs: Secondary | ICD-10-CM | POA: Diagnosis not present

## 2020-06-18 DIAGNOSIS — C61 Malignant neoplasm of prostate: Secondary | ICD-10-CM | POA: Diagnosis not present

## 2020-06-18 DIAGNOSIS — Z806 Family history of leukemia: Secondary | ICD-10-CM | POA: Insufficient documentation

## 2020-06-18 DIAGNOSIS — I509 Heart failure, unspecified: Secondary | ICD-10-CM | POA: Insufficient documentation

## 2020-06-18 DIAGNOSIS — I110 Hypertensive heart disease with heart failure: Secondary | ICD-10-CM | POA: Insufficient documentation

## 2020-06-18 DIAGNOSIS — Z87891 Personal history of nicotine dependence: Secondary | ICD-10-CM | POA: Diagnosis not present

## 2020-06-18 DIAGNOSIS — Z79899 Other long term (current) drug therapy: Secondary | ICD-10-CM | POA: Insufficient documentation

## 2020-06-18 NOTE — Consult Note (Signed)
NEW PATIENT EVALUATION  Name: Matthew Mcgrath  MRN: [id number]  Date:   06/18/2020     DOB: Aug 02, 1945   This 75 y.o. 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  REFERRING PHYSICIAN: [facility] [place]:  Chief Complaint  Patient presents with  . Follow-up    DIAGNOSIS: The encounter diagnosis was Prostate cancer metastatic to bone [facility]).   PREVIOUS INVESTIGATIONS:  Bone scan and CT scans reviewed Pathology report reviewed Clinical notes reviewed  HPI: Patient is a 75 year old male who presented with increasing urgency and frequency.  He was evaluated by urology PSA was 30.4.  He underwent transrectal ultrasound-guided biopsies showing all cores positive for Gleason 9 (5+4).  CT scan of the abdomen pelvis showed sclerotic lesions lumbar spine concerning for metastatic disease they had a positive bone scan in both the lumbar and thoracic region those were the only 2 areas of gross metastatic disease.  Patient has been started on Eligard.  Based on [facility] recommendationsdarolutamide.  Has been requested and is being authorized.  Patient specifically denies back pain at this time he is ambulating well he does have some frequency and urgency of urination although that has improved.  PLANNED TREATMENT REGIMEN: Prostate and pelvic lymph node radiation as well as radiation therapy to both areas of thoracic lumbar disease  PAST MEDICAL HISTORY:  has a past medical history of Cardiomyopathy ([place]), Congestive heart failure ([place]), Diabetes mellitus type 2, controlled ([place]), Elevated lipids, and HTN (hypertension).    PAST SURGICAL HISTORY:  Past Surgical History:  Procedure Laterality Date  . CARDIAC CATHETERIZATION N/A 04/14/2015   Procedure: Left Heart Cath and Coronary Angiography;  Surgeon: [name], MD;  Location: [place] [facility];   Service: Cardiovascular;  Laterality: N/A;  . CATARACT EXTRACTION W/ INTRAOCULAR LENS  IMPLANT, BILATERAL Left   . COLONOSCOPY WITH PROPOFOL N/A 04/08/2020   Procedure: COLONOSCOPY WITH PROPOFOL;  Surgeon: Toledo, Benay Pike, MD;  Location: [facility];  Service: Gastroenterology;  Laterality: N/A;  . HERNIA REPAIR    . IMPLANTABLE CARDIOVERTER DEFIBRILLATOR (ICD) GENERATOR CHANGE Left 07/03/2015   Procedure: ICD IMPLANT single chamber;  Surgeon: Marzetta Board, MD;  Location: [facility] ORS;  Service: Cardiovascular;  Laterality: Left;    FAMILY HISTORY: family history includes Alcoholism in his father; Colon cancer in his brother; Diabetes in his brother and sister; Heart disease in his mother; Heart failure in his mother; Hypertension in his brother and sister; Multiple myeloma in his brother.  SOCIAL HISTORY:  reports that he quit smoking about 4 months ago. His smoking use included cigarettes. He has a 7.50 pack-year smoking history. He has never used smokeless tobacco. He reports previous drug use. Frequency: 2.00 times per week. Drug: Marijuana. He reports that he does not drink alcohol.  ALLERGIES: Patient has no known allergies.  MEDICATIONS:  Current Outpatient Medications  Medication Sig Dispense Refill  . acetaminophen (TYLENOL) 650 MG CR tablet Take 650 mg by mouth every 8 (eight) hours as needed for pain.    Marland Kitchen aspirin 81 MG tablet Take 81 mg by mouth daily.    . carvedilol (COREG) 25 MG tablet Take 25 mg by mouth 2 (two) times daily with a meal.    . cholecalciferol (VITAMIN D3) 25 MCG (1000 UNIT) tablet Take 1,000 Units by mouth daily.    . Docosahexaenoic Acid (DHA OMEGA 3 PO)  1,000 mg.    . furosemide (LASIX) 40 MG tablet Take 40 mg by mouth daily.     Marland Kitchen glipiZIDE (GLUCOTROL) 5 MG tablet Take by mouth daily before breakfast.    . glucose blood (TRUE METRIX BLOOD GLUCOSE TEST) test strip 1 each by Other route as needed for other. Use as instructed    . LIDODERM 5 % Apply 1 patch to  skin every morning as needed for L lower back pain. Remove after 12 hours. Call for refills (Patient not taking: No sig reported)    . lisinopril (ZESTRIL) 10 MG tablet Take 10 mg by mouth daily.     . magnesium oxide (MAG-OX) 400 MG tablet Take 400 mg by mouth 2 (two) times daily.    . metFORMIN (GLUCOPHAGE) 500 MG tablet Take by mouth 2 (two) times daily with a meal.    . MIRALAX 17 GM/SCOOP powder Take 17 g by mouth daily.    . Multiple Vitamin (MULTIVITAMIN ADULT PO) SMARTSIG:1 Tablet(s) By Mouth Daily    . potassium chloride (KLOR-CON) 10 MEQ tablet Take 10 mEq by mouth daily.    . simvastatin (ZOCOR) 40 MG tablet Take 40 mg by mouth daily at 6 PM.     . spironolactone (ALDACTONE) 25 MG tablet Take 25 mg by mouth daily.    . TRUEplus Lancets 28G MISC by Does not apply route.     No current facility-administered medications for this encounter.    ECOG PERFORMANCE STATUS:  1 - Symptomatic but completely ambulatory  REVIEW OF SYSTEMS: Patient denies any weight loss, fatigue, weakness, fever, chills or night sweats. Patient denies any loss of vision, blurred vision. Patient denies any ringing  of the ears or hearing loss. No irregular heartbeat. Patient denies heart murmur or history of fainting. Patient denies any chest pain or pain radiating to her upper extremities. Patient denies any shortness of breath, difficulty breathing at night, cough or hemoptysis. Patient denies any swelling in the lower legs. Patient denies any nausea vomiting, vomiting of blood, or coffee ground material in the vomitus. Patient denies any stomach pain. Patient states has had normal bowel movements no significant constipation or diarrhea. Patient denies any dysuria, hematuria or significant nocturia. Patient denies any problems walking, swelling in the joints or loss of balance. Patient denies any skin changes, loss of hair or loss of weight. Patient denies any excessive worrying or anxiety or significant depression.  Patient denies any problems with insomnia. Patient denies excessive thirst, polyuria, polydipsia. Patient denies any swollen glands, patient denies easy bruising or easy bleeding. Patient denies any recent infections, allergies or URI. Patient "s visual fields have not changed significantly in recent time.   PHYSICAL EXAM: BP (!) 154/84   Pulse 80   Temp (!) 96 F (35.6 C) (Tympanic)   Wt 192 lb 12.8 oz (87.5 kg)   BMI 32.08 kg/m  No pain is elicited on deep palpation of the spine.  Motor and sensory levels are equal and symmetric in the lower extremities.  Well-developed well-nourished patient in NAD. HEENT reveals PERLA, EOMI, discs not visualized.  Oral cavity is clear. No oral mucosal lesions are identified. Neck is clear without evidence of cervical or supraclavicular adenopathy. Lungs are clear to A&P. Cardiac examination is essentially unremarkable with regular rate and rhythm without murmur rub or thrill. Abdomen is benign with no organomegaly or masses noted. Motor sensory and DTR levels are equal and symmetric in the upper and lower extremities. Cranial nerves II through  XII are grossly intact. Proprioception is intact. No peripheral adenopathy or edema is identified. No motor or sensory levels are noted. Crude visual fields are within normal range.  LABORATORY DATA: Pathology report reviewed    RADIOLOGY RESULTS: Bone scan and CT scans reviewed compatible with above-stated findings   IMPRESSION: Low-volume stage IV adenocarcinoma the prostate in 75 year old male  PLAN: At this time I would recommend going ahead with IMRT radiation therapy to both his prostate and pelvic nodes.  We will plan on delivering 80 Pearline Cables to his prostate and 39 Pearline Cables to his pelvic nodes using IMRT dose painting technique.  I would also treat his upper lumbar and thoracic spine lesions to 3000 cGy in 5 fractions and gain again using 3D hybrid technique.  Risks and benefits of treatment occluding increased lower  urinary tract symptoms diarrhea fatigue alteration of blood counts were reviewed with the patient and his sister.  They have still not decided on whether they will accept treatment I put off his CT simulation 2 to 3 weeks and they will make optimize prior to that appointment.  Patient continues on Eligard at this time.  Patient and sister know to call with any concerns.  I would like to take this opportunity to thank you for allowing me to participate in the care of your patient.Noreene Filbert, MD

## 2020-06-22 ENCOUNTER — Telehealth: Payer: Self-pay

## 2020-06-22 NOTE — Telephone Encounter (Signed)
Done...  Appts were sched as requested Labs on 4/28 @ 10:45 and will RTC on 07/06/20 @ 1045 to F/U with MD.. Called [facility] and made Kia aware of pts appts dates and times

## 2020-06-22 NOTE — Telephone Encounter (Signed)
Corazon!! I'll cx his labs and make [facility] aware.

## 2020-06-22 NOTE — Telephone Encounter (Signed)
Message received from Dr. Wilford Grist with [facility]:  Our pharmacy is sending the darolutamide out to him today. He'll start 2 tabs BID tomorrow morning. I'll make sure he's set up with you in 2 weeks and we'll get the labs in advance.   Dr. Tasia Catchings requested they draw cbc, cmp, and PSA.  MD would like for patient to be scheduled for f/u in 2 weeks with labs done prior with [facility].    Please scheule and inform [facility] and patient of appt details.

## 2020-06-22 NOTE — Telephone Encounter (Signed)
No need for lab appt here, Labs will be drawn with [facility] and results faxed to [place].

## 2020-06-29 ENCOUNTER — Encounter: Payer: Self-pay | Admitting: Oncology

## 2020-06-29 ENCOUNTER — Ambulatory Visit: Payer: Medicare (Managed Care) | Admitting: Urology

## 2020-06-29 NOTE — Telephone Encounter (Signed)
Omniseq test results in [facility].

## 2020-07-02 ENCOUNTER — Ambulatory Visit
Admission: RE | Admit: 2020-07-02 | Discharge: 2020-07-02 | Disposition: A | Discharge status: Home / Self Care | Payer: Medicare (Managed Care) | Source: Ambulatory Visit | Attending: Radiation Oncology | Admitting: Radiation Oncology

## 2020-07-02 ENCOUNTER — Other Ambulatory Visit: Payer: Medicare (Managed Care)

## 2020-07-02 DIAGNOSIS — C775 Secondary and unspecified malignant neoplasm of intrapelvic lymph nodes: Secondary | ICD-10-CM | POA: Insufficient documentation

## 2020-07-02 DIAGNOSIS — C61 Malignant neoplasm of prostate: Secondary | ICD-10-CM | POA: Diagnosis present

## 2020-07-02 DIAGNOSIS — C7951 Secondary malignant neoplasm of bone: Secondary | ICD-10-CM | POA: Diagnosis not present

## 2020-07-03 ENCOUNTER — Other Ambulatory Visit: Payer: Self-pay | Admitting: *Deleted

## 2020-07-03 DIAGNOSIS — C61 Malignant neoplasm of prostate: Secondary | ICD-10-CM

## 2020-07-06 ENCOUNTER — Other Ambulatory Visit: Payer: Self-pay | Admitting: Oncology

## 2020-07-06 ENCOUNTER — Other Ambulatory Visit: Payer: Self-pay

## 2020-07-06 ENCOUNTER — Inpatient Hospital Stay: Payer: Medicare (Managed Care)

## 2020-07-06 ENCOUNTER — Inpatient Hospital Stay: Payer: Medicare (Managed Care) | Attending: Oncology | Admitting: Oncology

## 2020-07-06 ENCOUNTER — Encounter: Payer: Self-pay | Admitting: Oncology

## 2020-07-06 VITALS — BP 129/84 | HR 67 | Temp 96.6°F | Resp 16 | Wt 193.7 lb

## 2020-07-06 DIAGNOSIS — Z800 Family history of malignant neoplasm of digestive organs: Secondary | ICD-10-CM | POA: Insufficient documentation

## 2020-07-06 DIAGNOSIS — C61 Malignant neoplasm of prostate: Secondary | ICD-10-CM

## 2020-07-06 DIAGNOSIS — Z7189 Other specified counseling: Secondary | ICD-10-CM

## 2020-07-06 DIAGNOSIS — Z5111 Encounter for antineoplastic chemotherapy: Secondary | ICD-10-CM | POA: Diagnosis not present

## 2020-07-06 DIAGNOSIS — Z7982 Long term (current) use of aspirin: Secondary | ICD-10-CM | POA: Diagnosis not present

## 2020-07-06 DIAGNOSIS — I509 Heart failure, unspecified: Secondary | ICD-10-CM | POA: Diagnosis not present

## 2020-07-06 DIAGNOSIS — I110 Hypertensive heart disease with heart failure: Secondary | ICD-10-CM | POA: Insufficient documentation

## 2020-07-06 DIAGNOSIS — Z79899 Other long term (current) drug therapy: Secondary | ICD-10-CM | POA: Diagnosis not present

## 2020-07-06 DIAGNOSIS — E785 Hyperlipidemia, unspecified: Secondary | ICD-10-CM | POA: Diagnosis not present

## 2020-07-06 DIAGNOSIS — Z7984 Long term (current) use of oral hypoglycemic drugs: Secondary | ICD-10-CM | POA: Insufficient documentation

## 2020-07-06 DIAGNOSIS — M545 Low back pain, unspecified: Secondary | ICD-10-CM | POA: Insufficient documentation

## 2020-07-06 DIAGNOSIS — C7951 Secondary malignant neoplasm of bone: Secondary | ICD-10-CM

## 2020-07-06 DIAGNOSIS — Z87891 Personal history of nicotine dependence: Secondary | ICD-10-CM | POA: Diagnosis not present

## 2020-07-06 DIAGNOSIS — E119 Type 2 diabetes mellitus without complications: Secondary | ICD-10-CM | POA: Insufficient documentation

## 2020-07-06 LAB — CBC WITH DIFFERENTIAL/PLATELET
Abs Immature Granulocytes: 0.01 10*3/uL (ref 0.00–0.07)
Basophils Absolute: 0 10*3/uL (ref 0.0–0.1)
Basophils Relative: 0 %
Eosinophils Absolute: 0.1 10*3/uL (ref 0.0–0.5)
Eosinophils Relative: 2 %
HCT: 35.8 % — ABNORMAL LOW (ref 39.0–52.0)
Hemoglobin: 11.7 g/dL — ABNORMAL LOW (ref 13.0–17.0)
Immature Granulocytes: 0 %
Lymphocytes Relative: 34 %
Lymphs Abs: 1.8 10*3/uL (ref 0.7–4.0)
MCH: 30.7 pg (ref 26.0–34.0)
MCHC: 32.7 g/dL (ref 30.0–36.0)
MCV: 94 fL (ref 80.0–100.0)
Monocytes Absolute: 0.5 10*3/uL (ref 0.1–1.0)
Monocytes Relative: 9 %
Neutro Abs: 2.8 10*3/uL (ref 1.7–7.7)
Neutrophils Relative %: 55 %
Platelets: 196 10*3/uL (ref 150–400)
RBC: 3.81 MIL/uL — ABNORMAL LOW (ref 4.22–5.81)
RDW: 12.5 % (ref 11.5–15.5)
WBC: 5.2 10*3/uL (ref 4.0–10.5)
nRBC: 0 % (ref 0.0–0.2)

## 2020-07-06 LAB — COMPREHENSIVE METABOLIC PANEL
ALT: 15 U/L (ref 0–44)
AST: 17 U/L (ref 15–41)
Albumin: 4 g/dL (ref 3.5–5.0)
Alkaline Phosphatase: 67 U/L (ref 38–126)
Anion gap: 12 (ref 5–15)
BUN: 29 mg/dL — ABNORMAL HIGH (ref 8–23)
CO2: 27 mmol/L (ref 22–32)
Calcium: 9.3 mg/dL (ref 8.9–10.3)
Chloride: 97 mmol/L — ABNORMAL LOW (ref 98–111)
Creatinine, Ser: 0.9 mg/dL (ref 0.61–1.24)
GFR, Estimated: >60 mL/min (ref >60)
Glucose, Bld: 217 mg/dL — ABNORMAL HIGH (ref 70–99)
Potassium: 4.5 mmol/L (ref 3.5–5.1)
Sodium: 136 mmol/L (ref 135–145)
Total Bilirubin: 0.8 mg/dL (ref 0.3–1.2)
Total Protein: 7.6 g/dL (ref 6.5–8.1)

## 2020-07-06 LAB — PSA: Prostatic Specific Antigen: 0.39 ng/mL (ref 0.00–4.00)

## 2020-07-06 NOTE — Progress Notes (Signed)
Patient denies new problems/concerns today.   °

## 2020-07-06 NOTE — Progress Notes (Signed)
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  male with PMH listed below was seen in consultation at the request of  Inc, [place]  for evaluation of metastatic prostate cancer. Patient was accompanied by her sister who he currently lives with.  History was mainly obtained from sister as well as reviewing medical records.  Patient is a poor historian. Per sister, she has noticed patient having increased urgency and frequency of urination.  Patient was evaluated by urologist Dr. Diamantina Providence in January 2022.  PSA was elevated at 30.4 in January 2022 04/01/2020, prostate biopsy showed  All cores were positive for high risk prostate cancer, primarily Gleason score 5+4 = 9, with max core involvement of 100% and perineural invasion present. 04/21/2020, CT abdomen pelvis showed a sclerotic lesion in the lumbar spine concerning for probable metastatic disease.  No definitive extra skeletal metastatic disease noted elsewhere in the abdomen or pelvis.  Mild circumferential bladder wall thickening.  Aortic atherosclerosis. 04/27/2020, bone scan showed foci of abnormal uptake noted in the mid thoracic and upper lumbar spine concerning for metastatic disease.  Patient reports some lower back pain intermittently.  He currently lives with his sister.   Past medical history includes CHF, hypertension, hyper lipidemia, diabetes type 2 Per sister, family history is positive for colon cancer and another brother, multiple myeloma in the other brother.  04/16/2020  Firmagon 240 mg loading dose #06/11/2020 patient went to [facility] for second opinion was seen by [name] who agrees with continuing ADT with switch to Eligard 45 mg.  He is due to proceed with the treatments today.  [facility]icated with [facility] [name], patient will receive treatment at [place]. [name] also recommends to start darolutamide and authorization process has been initiated at [name].  INTERVAL HISTORY Matthew Mcgrath is a 75 y.o. male who has above history reviewed by me today presents for follow up visit for management of metastatic prostate cancer Problems and complaints are listed below: During the interval, patient has started on darolutamide 600 twice daily-[facility].  Also has establish care with radiation oncology and plans to start on radiation of prostate and pelvic nodes. Patient reports no new complaints.  Intermittent back pain which is chronic.  He was accompanied by wife.  Review of Systems  Constitutional: Negative for appetite change, chills, fatigue, fever and unexpected weight change.  HENT:   Negative for hearing loss and voice change.   Eyes: Negative for eye problems and icterus.  Respiratory: Negative for chest tightness, cough and shortness of breath.   Cardiovascular: Negative for chest pain and leg swelling.  Gastrointestinal: Negative for abdominal distention and abdominal pain.  Endocrine: Negative for hot flashes.  Genitourinary: Negative for difficulty urinating, dysuria and frequency.   Musculoskeletal: Positive for back pain. Negative for arthralgias.  Skin: Negative for itching and rash.  Neurological: Negative for light-headedness and numbness.  Hematological: Negative for adenopathy. Does not bruise/bleed easily.  Psychiatric/Behavioral: Negative for confusion.    MEDICAL HISTORY:  Past Medical History:  Diagnosis Date  . Cardiomyopathy ([place])   . Congestive heart failure ([place])   . Diabetes mellitus  type 2, controlled ([place])   . Elevated  lipids   . HTN (hypertension)     SURGICAL HISTORY: Past Surgical History:  Procedure Laterality Date  . CARDIAC CATHETERIZATION N/A 04/14/2015   Procedure: Left Heart Cath and Coronary Angiography;  Surgeon: [name], MD;  Location: [place] CV LAB;  Service: Cardiovascular;  Laterality: N/A;  . CATARACT EXTRACTION W/ INTRAOCULAR LENS  IMPLANT, BILATERAL Left   . COLONOSCOPY WITH PROPOFOL N/A 04/08/2020   Procedure: COLONOSCOPY WITH PROPOFOL;  Surgeon: Toledo, Benay Pike, MD;  Location: [facility] ENDOSCOPY;  Service: Gastroenterology;  Laterality: N/A;  . HERNIA REPAIR    . IMPLANTABLE CARDIOVERTER DEFIBRILLATOR (ICD) GENERATOR CHANGE Left 07/03/2015   Procedure: ICD IMPLANT single chamber;  Surgeon: Marzetta Board, MD;  Location: [facility] ORS;  Service: Cardiovascular;  Laterality: Left;    SOCIAL HISTORY: Social History   Socioeconomic History  . Marital status: Single    Spouse name: Not on file  . Number of children: Not on file  . Years of education: Not on file  . Highest education level: Not on file  Occupational History  . Not on file  Tobacco Use  . Smoking status: Former Smoker    Packs/day: 0.25    Years: 30.00    Pack years: 7.50    Types: Cigarettes    Quit date: 02/11/2020    Years since quitting: 0.4  . Smokeless tobacco: Never Used  Vaping Use  . Vaping Use: Never used  Substance and Sexual Activity  . Alcohol use: No  . Drug use: Not Currently    Frequency: 2.0 times per week    Types: Marijuana    Comment: past use  . Sexual activity: Not on file  Other Topics Concern  . Not on file  Social History Narrative  . Not on file   Social Determinants of Health   Financial Resource Strain: Not on file  Food Insecurity: Not on file  Transportation Needs: Not on file  Physical Activity: Not on file  Stress: Not on file  Social Connections: Not on file  Intimate Partner Violence: Not on file    FAMILY HISTORY: Family History  Problem Relation  Age of Onset  . Heart failure Mother   . Heart disease Mother   . Alcoholism Father   . Hypertension Sister   . Diabetes Sister   . Hypertension Brother   . Diabetes Brother   . Colon cancer Brother   . Multiple myeloma Brother     ALLERGIES:  has No Known Allergies.  MEDICATIONS:  Current Outpatient Medications  Medication Sig Dispense Refill  . acetaminophen (TYLENOL) 650 MG CR tablet Take 650 mg by mouth every 8 (eight) hours as needed for pain.    Marland Kitchen aspirin EC 81 MG tablet @@TAKE  1 TABLET BY MOUTH ONCE DAILY.    . carvedilol (COREG) 25 MG tablet Take 25 mg by mouth 2 (two) times daily with a meal.    . cholecalciferol (VITAMIN D3) 25 MCG (1000 UNIT) tablet Take 1,000 Units by mouth daily.    . darolutamide (NUBEQA) 300 MG tablet Take by mouth.    . Docosahexaenoic Acid (DHA OMEGA 3 PO) 1,000 mg.    . furosemide (LASIX) 40 MG tablet Take 40 mg by mouth daily.     Marland Kitchen glipiZIDE (GLUCOTROL) 5 MG tablet Take by mouth daily before breakfast.    . glucose blood (TRUE METRIX BLOOD GLUCOSE TEST) test strip 1 each by Other route as needed for other.  Use as instructed    . lisinopril (ZESTRIL) 10 MG tablet Take 10 mg by mouth daily.     . magnesium oxide (MAG-OX) 400 MG tablet Take 400 mg by mouth 2 (two) times daily.    . metFORMIN (GLUCOPHAGE) 500 MG tablet Take by mouth 2 (two) times daily with a meal.    . MIRALAX 17 GM/SCOOP powder Take 17 g by mouth daily.    . Multiple Vitamin (MULTIVITAMIN ADULT PO) SMARTSIG:1 Tablet(s) By Mouth Daily    . potassium chloride (KLOR-CON) 10 MEQ tablet Take 10 mEq by mouth daily.    . simvastatin (ZOCOR) 40 MG tablet Take 40 mg by mouth daily at 6 PM.     . spironolactone (ALDACTONE) 25 MG tablet Take 25 mg by mouth daily.    . TRUEplus Lancets 28G MISC by Does not apply route.    Marland Kitchen LIDODERM 5 % Apply 1 patch to skin every morning as needed for L lower back pain. Remove after 12 hours. Call for refills (Patient not taking: No sig reported)     No  current facility-administered medications for this visit.     PHYSICAL EXAMINATION: ECOG PERFORMANCE STATUS: 1 - Symptomatic but completely ambulatory Vitals:   07/06/20 1059  BP: 129/84  Pulse: 67  Resp: 16  Temp: (!) 96.6 F (35.9 C)   Filed Weights   07/06/20 1059  Weight: 193 lb 11.2 oz (87.9 kg)    Physical Exam Constitutional:      General: He is not in acute distress. HENT:     Head: Normocephalic and atraumatic.  Eyes:     General: No scleral icterus. Cardiovascular:     Rate and Rhythm: Normal rate and regular rhythm.     Heart sounds: Normal heart sounds.  Pulmonary:     Effort: Pulmonary effort is normal. No respiratory distress.     Breath sounds: No wheezing.  Abdominal:     General: Bowel sounds are normal. There is no distension.     Palpations: Abdomen is soft.  Musculoskeletal:        General: No deformity. Normal range of motion.     Cervical back: Normal range of motion and neck supple.  Skin:    General: Skin is warm and dry.     Findings: No erythema or rash.  Neurological:     Mental Status: He is alert and oriented to person, place, and time. Mental status is at baseline.     Cranial Nerves: No cranial nerve deficit.     Coordination: Coordination normal.  Psychiatric:        Mood and Affect: Mood normal.     LABORATORY DATA:  I have reviewed the data as listed Lab Results  Component Value Date   WBC 5.2 07/06/2020   HGB 11.7 (L) 07/06/2020   HCT 35.8 (L) 07/06/2020   MCV 94.0 07/06/2020   PLT 196 07/06/2020   Recent Labs    05/14/20 1247 06/11/20 0939 07/06/20 1114  NA 137 136 136  K 4.6 4.8 4.5  CL 102 101 97*  CO2 27 26 27   GLUCOSE 264* 239* 217*  BUN 17 19 29*  CREATININE 0.86 0.83 0.90  CALCIUM 9.3 9.4 9.3  GFRNONAA >60 >60 >60  PROT 7.1 7.3 7.6  ALBUMIN 3.9 3.9 4.0  AST 21 19 17   ALT 19 14 15   ALKPHOS 49 54 67  BILITOT 0.8 0.7 0.8   Iron/TIBC/Ferritin/ %Sat No results found for: IRON, TIBC, FERRITIN,  IRONPCTSAT   Tests done at [facility] 06/10/20  Tests: (1) Comp. Metabolic Panel (14) (701779)  Glucose       [H] 220 mg/dL          65-99  BUN            18 mg/dL          8-27  Creatinine        0.84 mg/dL         0.76-1.27  eGFR           93 mL/min/1.73       >59  BUN/Creatinine Ratio   21             10-24  Sodium          136 mmol/L         134-144  Potassium         4.9 mmol/L         3.5-5.2  Chloride         98 mmol/L          96-106  Carbon Dioxide, Total             [L] 19 mmol/L          20-29  Calcium          9.8 mg/dL          8.6-10.2  Protein, Total      7.0 g/dL          6.0-8.5  Albumin          4.4 g/dL          3.7-4.7  Globulin, Total      2.6 g/dL          1.5-4.5  A/G Ratio         1.7             1.2-2.2  Bilirubin, Total     0.4 mg/dL          0.0-1.2  Alkaline Phosphatase   65 IU/L           44-121  AST (SGOT)        15 IU/L           0-40  ALT (SGPT)        13 IU/L           0-44   Tests: (1) CBC With Differential/Platelet (005009)  WBC            4.9 x10E3/uL        3.4-10.8  RBC         [L] 4.12 x10E6/uL        4.14-5.80  Hemoglobin      [L] 12.5 g/dL          13.0-17.7  Hematocrit        38.7 %           37.5-51.0  MCV            94 fL            79-97  MCH            30.3 pg           26.6-33.0  MCHC           32.3 g/dL          31.5-35.7  RDW            11.6 %  11.6-15.4  Platelets         200 x10E3/uL        150-450   Neutrophils        55 %            Not Estab.  Lymphs          32 %            Not Estab.  Monocytes         9 %             Not Estab.  Eos            3 %             Not Estab.  Basos           1 %             Not Estab.  Neutrophils (Absolute)               2.8 x10E3/uL        1.4-7.0  Lymphs (Absolute)     1.6 x10E3/uL        0.7-3.1  Monocytes(Absolute)    0.4 x10E3/uL        0.1-0.9  Eos (Absolute)      0.1 x10E3/uL        0.0-0.4  Baso (Absolute)      0.0 x10E3/uL        0.0-0.2  Immature Granulocytes               0 %             Not Estab.  Immature Grans (Abs)   0.0 x10E3/uL        0.0-0.1    PSA 4.8   RADIOGRAPHIC STUDIES: I have personally reviewed the radiological images as listed and agreed with the findings in the report. NM Bone Scan Whole Body  Result Date: 04/27/2020 CLINICAL DATA:  Prostate cancer. EXAM: NUCLEAR MEDICINE WHOLE BODY BONE SCAN TECHNIQUE: Whole body anterior and posterior images were obtained approximately 3 hours after intravenous injection of radiopharmaceutical. RADIOPHARMACEUTICALS:  21.584 mCi Technetium-80mMDP IV COMPARISON:  April 21, 2020. FINDINGS: Focus of abnormal uptake is seen involving both big toes consistent with degenerative change. Focus of abnormal uptake is seen at approximately the L2 level concerning for metastatic disease. This corresponds to abnormality seen on prior CT. There is also noted focus of abnormal uptake involving midthoracic spine concerning for metastatic disease. IMPRESSION: Foci of abnormal uptake are noted in midthoracic and upper lumbar spine concerning for metastatic disease. Electronically Signed   By: JMarijo ConceptionM.D.   On: 04/27/2020 13:22   CT Abdomen Pelvis W  Contrast  Result Date: 04/21/2020 CLINICAL DATA:  75year old male with history of prostate cancer. Staging examination. EXAM: CT ABDOMEN AND PELVIS WITH CONTRAST TECHNIQUE: Multidetector CT imaging of the abdomen and pelvis was performed using the standard protocol following bolus administration of intravenous contrast. CONTRAST:  1028mOMNIPAQUE IOHEXOL 300 MG/ML  SOLN COMPARISON:  No priors. FINDINGS: Lower chest: Atherosclerotic calcifications in the descending thoracic aorta as well as the left anterior descending, left circumflex and right coronary arteries. Pacemaker/AICD lead terminating in the right ventricle near the apex. Hepatobiliary: No suspicious cystic or solid hepatic lesions. No intra or extrahepatic biliary ductal dilatation. Gallbladder is normal in appearance. Pancreas: No pancreatic mass. No pancreatic ductal dilatation. No pancreatic or peripancreatic fluid collections or inflammatory changes. Spleen: Unremarkable. Adrenals/Urinary Tract: Subcentimeter  low-attenuation lesion in the upper pole of the left kidney, too small to characterize, but statistically likely to represent a cyst. Right kidney and bilateral adrenal glands are normal in appearance. No hydroureteronephrosis. Mild circumferential urinary bladder wall thickening, without discrete mass. Stomach/Bowel: The appearance of the stomach is normal. There is no pathologic dilatation of small bowel or colon. Normal appendix. Vascular/Lymphatic: Aortic atherosclerosis with aneurysmal dilatation of the common iliac arteries bilaterally measuring up to 1.9 cm on the right and 1.6 cm on the left. 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 No lymphadenopathy noted in the abdomen or pelvis. Reproductive: Prostate gland and seminal vesicles are grossly unremarkable in appearance. Other: No significant volume of ascites.  No pneumoperitoneum. Musculoskeletal: There is  several sclerotic lesions are noted throughout the visualized skeleton, largest of which is in the L2 vertebral body measuring up to 1.7 cm in diameter (sagittal image 68 of series 6). IMPRESSION: 1. Sclerotic lesions in the lumbar spine concerning for probable metastatic disease. 2. No definite extra skeletal metastatic disease noted elsewhere in the abdomen or pelvis. 3. Mild circumferential bladder wall thickening which could be reflective of chronic bladder outlet obstruction. No discrete bladder wall mass confidently identified at this time. 4. Aortic atherosclerosis, in addition to at least 3 vessel coronary artery disease. 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 Electronically Signed   By: Vinnie Langton M.D.   On: 04/21/2020 08:53      ASSESSMENT & PLAN:  1. Prostate cancer ([place])   2. Metastatic cancer to bone ([place])   3. Encounter for antineoplastic chemotherapy   Cancer Staging Prostate cancer [facility]) Staging form: Prostate, AJCC 8th Edition - Clinical stage from 05/11/2020: Stage IVB (cTX, cN0, cM1, PSA: 30) - Signed by [name], MD on 05/11/2020   #Metastatic prostate cancer to bone. Continue androgen deprivation therapy.  Eligard 45 mg Q6 months [facility] Patient has been initiated the authorization process for darolutamide.  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 TMB 3.1 mut/Mb, MS stable, PD-L1 1% Consider possible rucaparib/olaparib in subsequent lines of treatment I recommend genetic testing.  #Patient is going to start definitive treatment for prostate and pelvic node.  Due to the low volume of metastatic disease. [name] and I appreciate his expert opinion of darolutamide concurrent use with RT and ADT Continue Darolutamide during treatment.   Supportive care measures are necessary for patient well-being and will be provided as  necessary. We spent sufficient time to discuss many aspect of care, questions were answered to patient's satisfaction.   All questions were answered. The patient knows to call the clinic with any problems questions or concerns. cc  [facility], [place]   Return of visit: 4 weeks.     ZEarlie Server MD, PhD Hematology Oncology [facility] Centerat ANorthwest Ohio Endoscopy CenterPager- [phone]/04/2020

## 2020-07-07 DIAGNOSIS — C61 Malignant neoplasm of prostate: Secondary | ICD-10-CM | POA: Diagnosis not present

## 2020-07-07 DIAGNOSIS — C7951 Secondary malignant neoplasm of bone: Secondary | ICD-10-CM | POA: Insufficient documentation

## 2020-07-07 DIAGNOSIS — C775 Secondary and unspecified malignant neoplasm of intrapelvic lymph nodes: Secondary | ICD-10-CM | POA: Insufficient documentation

## 2020-07-09 ENCOUNTER — Ambulatory Visit: Admission: RE | Admit: 2020-07-09 | Payer: Medicare (Managed Care) | Source: Ambulatory Visit

## 2020-07-13 ENCOUNTER — Ambulatory Visit
Admission: RE | Admit: 2020-07-13 | Discharge: 2020-07-13 | Disposition: A | Discharge status: Home / Self Care | Payer: Medicare (Managed Care) | Source: Ambulatory Visit | Attending: Radiation Oncology | Admitting: Radiation Oncology

## 2020-07-13 DIAGNOSIS — C61 Malignant neoplasm of prostate: Secondary | ICD-10-CM | POA: Diagnosis not present

## 2020-07-14 ENCOUNTER — Ambulatory Visit
Admission: RE | Admit: 2020-07-14 | Discharge: 2020-07-14 | Disposition: A | Discharge status: Home / Self Care | Payer: Medicare (Managed Care) | Source: Ambulatory Visit | Attending: Radiation Oncology | Admitting: Radiation Oncology

## 2020-07-14 DIAGNOSIS — C61 Malignant neoplasm of prostate: Secondary | ICD-10-CM | POA: Diagnosis not present

## 2020-07-15 ENCOUNTER — Ambulatory Visit
Admission: RE | Admit: 2020-07-15 | Discharge: 2020-07-15 | Disposition: A | Discharge status: Home / Self Care | Payer: Medicare (Managed Care) | Source: Ambulatory Visit | Attending: Radiation Oncology | Admitting: Radiation Oncology

## 2020-07-15 DIAGNOSIS — C61 Malignant neoplasm of prostate: Secondary | ICD-10-CM | POA: Diagnosis not present

## 2020-07-16 ENCOUNTER — Ambulatory Visit
Admission: RE | Admit: 2020-07-16 | Discharge: 2020-07-16 | Disposition: A | Discharge status: Home / Self Care | Payer: Medicare (Managed Care) | Source: Ambulatory Visit | Attending: Radiation Oncology | Admitting: Radiation Oncology

## 2020-07-16 DIAGNOSIS — C61 Malignant neoplasm of prostate: Secondary | ICD-10-CM | POA: Diagnosis not present

## 2020-07-17 ENCOUNTER — Ambulatory Visit
Admission: RE | Admit: 2020-07-17 | Discharge: 2020-07-17 | Disposition: A | Discharge status: Home / Self Care | Payer: Medicare (Managed Care) | Source: Ambulatory Visit | Attending: Radiation Oncology | Admitting: Radiation Oncology

## 2020-07-17 DIAGNOSIS — C61 Malignant neoplasm of prostate: Secondary | ICD-10-CM | POA: Diagnosis not present

## 2020-07-20 ENCOUNTER — Ambulatory Visit
Admission: RE | Admit: 2020-07-20 | Discharge: 2020-07-20 | Disposition: A | Discharge status: Home / Self Care | Payer: Medicare (Managed Care) | Source: Ambulatory Visit | Attending: Radiation Oncology | Admitting: Radiation Oncology

## 2020-07-20 DIAGNOSIS — C61 Malignant neoplasm of prostate: Secondary | ICD-10-CM | POA: Diagnosis not present

## 2020-07-21 ENCOUNTER — Ambulatory Visit
Admission: RE | Admit: 2020-07-21 | Discharge: 2020-07-21 | Disposition: A | Discharge status: Home / Self Care | Payer: Medicare (Managed Care) | Source: Ambulatory Visit | Attending: Radiation Oncology | Admitting: Radiation Oncology

## 2020-07-21 DIAGNOSIS — C61 Malignant neoplasm of prostate: Secondary | ICD-10-CM | POA: Diagnosis not present

## 2020-07-22 ENCOUNTER — Ambulatory Visit
Admission: RE | Admit: 2020-07-22 | Discharge: 2020-07-22 | Disposition: A | Discharge status: Home / Self Care | Payer: Medicare (Managed Care) | Source: Ambulatory Visit | Attending: Radiation Oncology | Admitting: Radiation Oncology

## 2020-07-22 DIAGNOSIS — C61 Malignant neoplasm of prostate: Secondary | ICD-10-CM | POA: Diagnosis not present

## 2020-07-23 ENCOUNTER — Ambulatory Visit
Admission: RE | Admit: 2020-07-23 | Discharge: 2020-07-23 | Disposition: A | Discharge status: Home / Self Care | Payer: Medicare (Managed Care) | Source: Ambulatory Visit | Attending: Radiation Oncology | Admitting: Radiation Oncology

## 2020-07-23 DIAGNOSIS — C61 Malignant neoplasm of prostate: Secondary | ICD-10-CM | POA: Diagnosis not present

## 2020-07-24 ENCOUNTER — Ambulatory Visit
Admission: RE | Admit: 2020-07-24 | Discharge: 2020-07-24 | Disposition: A | Discharge status: Home / Self Care | Payer: Medicare (Managed Care) | Source: Ambulatory Visit | Attending: Radiation Oncology | Admitting: Radiation Oncology

## 2020-07-24 DIAGNOSIS — C61 Malignant neoplasm of prostate: Secondary | ICD-10-CM | POA: Diagnosis not present

## 2020-07-27 ENCOUNTER — Inpatient Hospital Stay: Payer: Medicare (Managed Care)

## 2020-07-27 ENCOUNTER — Ambulatory Visit
Admission: RE | Admit: 2020-07-27 | Discharge: 2020-07-27 | Disposition: A | Discharge status: Home / Self Care | Payer: Medicare (Managed Care) | Source: Ambulatory Visit | Attending: Radiation Oncology | Admitting: Radiation Oncology

## 2020-07-27 DIAGNOSIS — C61 Malignant neoplasm of prostate: Secondary | ICD-10-CM | POA: Diagnosis not present

## 2020-07-28 ENCOUNTER — Ambulatory Visit
Admission: RE | Admit: 2020-07-28 | Discharge: 2020-07-28 | Disposition: A | Discharge status: Home / Self Care | Payer: Medicare (Managed Care) | Source: Ambulatory Visit | Attending: Radiation Oncology | Admitting: Radiation Oncology

## 2020-07-28 DIAGNOSIS — C61 Malignant neoplasm of prostate: Secondary | ICD-10-CM | POA: Diagnosis not present

## 2020-07-29 ENCOUNTER — Ambulatory Visit
Admission: RE | Admit: 2020-07-29 | Discharge: 2020-07-29 | Disposition: A | Discharge status: Home / Self Care | Payer: Medicare (Managed Care) | Source: Ambulatory Visit | Attending: Radiation Oncology | Admitting: Radiation Oncology

## 2020-07-29 DIAGNOSIS — C61 Malignant neoplasm of prostate: Secondary | ICD-10-CM | POA: Diagnosis not present

## 2020-07-30 ENCOUNTER — Other Ambulatory Visit: Payer: Self-pay

## 2020-07-30 ENCOUNTER — Ambulatory Visit
Admission: RE | Admit: 2020-07-30 | Discharge: 2020-07-30 | Disposition: A | Discharge status: Home / Self Care | Payer: Medicare (Managed Care) | Source: Ambulatory Visit | Attending: Radiation Oncology | Admitting: Radiation Oncology

## 2020-07-30 ENCOUNTER — Ambulatory Visit (INDEPENDENT_AMBULATORY_CARE_PROVIDER_SITE_OTHER): Payer: Medicare (Managed Care) | Admitting: Urology

## 2020-07-30 ENCOUNTER — Encounter: Payer: Self-pay | Admitting: Urology

## 2020-07-30 VITALS — BP 114/67 | HR 79 | Ht 65.0 in | Wt 192.0 lb

## 2020-07-30 DIAGNOSIS — R350 Frequency of micturition: Secondary | ICD-10-CM

## 2020-07-30 DIAGNOSIS — C61 Malignant neoplasm of prostate: Secondary | ICD-10-CM

## 2020-07-30 LAB — BLADDER SCAN AMB NON-IMAGING

## 2020-07-30 NOTE — Patient Instructions (Signed)
Take the Lasix(furosemide) in the morning, as this will cause increased urination.  Minimize fluids 3 to 4 hours before bed, and urinate right before going to bed.

## 2020-07-30 NOTE — Progress Notes (Signed)
   07/30/2020 1:09 PM   Matthew Mcgrath April 13, 1945 [id number]  Reason for visit: Metastatic prostate cancer, urinary frequency  HPI: 75 year old comorbid male here with his sister today for follow-up of metastatic prostate cancer being managed by oncology, as well as chronic urinary frequency.  Briefly, he was found to have an elevated PSA of 30 with an abnormal DRE underwent a prostate biopsy on 04/01/2020 that showed a 56 g prostate with a PSA density of 0.54 and all cores were positive for high risk prostate cancer primarily Gleason score 5+4 = 9 with max core involvement of 100%.  Bone scan showed foci of abnormal uptake in the mid thoracic and upper lumbar spine consistent with metastatic disease.  He is managed by oncology with ADT, darolutamide, and currently undergoing radiation to the prostate and pelvic lymph nodes.  Most recent PSA on 07/06/2020 was 0.39.  He reports mild urinary symptoms of urinary frequency and occasional urgency during the day.  This is unchanged from prior to the start of his prostate cancer treatment.  He is minimally bothered by his urinary symptoms.  Urinalysis today is benign with 0-5 WBCs, 0-2 RBCs, no bacteria, nitrite negative, no leukocytes.  PVR is normal at 66 mL.  He drinks primarily water during the day.  We discussed options including a trial of Flomax versus an OAB medication, but he is not bothered enough to consider medications at this time.  He also is on 40 mg Lasix daily which is contributing to his urinary frequency.  I recommended taking this in the morning to avoid nocturia.  Behavioral strategies discussed extensively.  RTC 6 months symptom check and PVR   Billey Co, MD  [facility] [address], [place], [place] [address] [phone]

## 2020-07-31 ENCOUNTER — Ambulatory Visit
Admission: RE | Admit: 2020-07-31 | Discharge: 2020-07-31 | Disposition: A | Discharge status: Home / Self Care | Payer: Medicare (Managed Care) | Source: Ambulatory Visit | Attending: Radiation Oncology | Admitting: Radiation Oncology

## 2020-07-31 DIAGNOSIS — C61 Malignant neoplasm of prostate: Secondary | ICD-10-CM | POA: Diagnosis not present

## 2020-07-31 LAB — MICROSCOPIC EXAMINATION
Bacteria, UA: NONE SEEN
Epithelial Cells (non renal): NONE SEEN /hpf (ref 0–10)

## 2020-07-31 LAB — URINALYSIS, COMPLETE
Bilirubin, UA: NEGATIVE
Ketones, UA: NEGATIVE
Leukocytes,UA: NEGATIVE
Nitrite, UA: NEGATIVE
Protein,UA: NEGATIVE
Specific Gravity, UA: 1.01 (ref 1.005–1.030)
Urobilinogen, Ur: 0.2 mg/dL (ref 0.2–1.0)
pH, UA: 5 (ref 5.0–7.5)

## 2020-08-04 ENCOUNTER — Inpatient Hospital Stay: Payer: Medicare (Managed Care)

## 2020-08-04 ENCOUNTER — Ambulatory Visit
Admission: RE | Admit: 2020-08-04 | Discharge: 2020-08-04 | Disposition: A | Discharge status: Home / Self Care | Payer: Medicare (Managed Care) | Source: Ambulatory Visit | Attending: Radiation Oncology | Admitting: Radiation Oncology

## 2020-08-04 DIAGNOSIS — C61 Malignant neoplasm of prostate: Secondary | ICD-10-CM | POA: Diagnosis not present

## 2020-08-05 ENCOUNTER — Ambulatory Visit
Admission: RE | Admit: 2020-08-05 | Discharge: 2020-08-05 | Disposition: A | Discharge status: Home / Self Care | Payer: Medicare (Managed Care) | Source: Ambulatory Visit | Attending: Radiation Oncology | Admitting: Radiation Oncology

## 2020-08-05 DIAGNOSIS — C61 Malignant neoplasm of prostate: Secondary | ICD-10-CM | POA: Insufficient documentation

## 2020-08-05 DIAGNOSIS — C775 Secondary and unspecified malignant neoplasm of intrapelvic lymph nodes: Secondary | ICD-10-CM | POA: Insufficient documentation

## 2020-08-05 DIAGNOSIS — C7951 Secondary malignant neoplasm of bone: Secondary | ICD-10-CM | POA: Insufficient documentation

## 2020-08-06 ENCOUNTER — Ambulatory Visit
Admission: RE | Admit: 2020-08-06 | Discharge: 2020-08-06 | Disposition: A | Discharge status: Home / Self Care | Payer: Medicare (Managed Care) | Source: Ambulatory Visit | Attending: Radiation Oncology | Admitting: Radiation Oncology

## 2020-08-06 DIAGNOSIS — C61 Malignant neoplasm of prostate: Secondary | ICD-10-CM | POA: Diagnosis not present

## 2020-08-07 ENCOUNTER — Encounter: Payer: Self-pay | Admitting: Oncology

## 2020-08-07 ENCOUNTER — Other Ambulatory Visit: Payer: Self-pay

## 2020-08-07 ENCOUNTER — Inpatient Hospital Stay: Payer: Medicare (Managed Care)

## 2020-08-07 ENCOUNTER — Ambulatory Visit
Admission: RE | Admit: 2020-08-07 | Discharge: 2020-08-07 | Disposition: A | Discharge status: Home / Self Care | Payer: Medicare (Managed Care) | Source: Ambulatory Visit | Attending: Radiation Oncology | Admitting: Radiation Oncology

## 2020-08-07 ENCOUNTER — Inpatient Hospital Stay: Payer: Medicare (Managed Care) | Attending: Oncology | Admitting: Oncology

## 2020-08-07 VITALS — BP 121/76 | HR 71 | Temp 95.8°F | Resp 16 | Wt 194.0 lb

## 2020-08-07 DIAGNOSIS — Z5111 Encounter for antineoplastic chemotherapy: Secondary | ICD-10-CM

## 2020-08-07 DIAGNOSIS — Z79899 Other long term (current) drug therapy: Secondary | ICD-10-CM | POA: Diagnosis not present

## 2020-08-07 DIAGNOSIS — E785 Hyperlipidemia, unspecified: Secondary | ICD-10-CM | POA: Diagnosis not present

## 2020-08-07 DIAGNOSIS — Z7984 Long term (current) use of oral hypoglycemic drugs: Secondary | ICD-10-CM | POA: Diagnosis not present

## 2020-08-07 DIAGNOSIS — C7951 Secondary malignant neoplasm of bone: Secondary | ICD-10-CM | POA: Insufficient documentation

## 2020-08-07 DIAGNOSIS — I110 Hypertensive heart disease with heart failure: Secondary | ICD-10-CM | POA: Insufficient documentation

## 2020-08-07 DIAGNOSIS — Z7982 Long term (current) use of aspirin: Secondary | ICD-10-CM | POA: Insufficient documentation

## 2020-08-07 DIAGNOSIS — C61 Malignant neoplasm of prostate: Secondary | ICD-10-CM | POA: Diagnosis not present

## 2020-08-07 DIAGNOSIS — E119 Type 2 diabetes mellitus without complications: Secondary | ICD-10-CM | POA: Insufficient documentation

## 2020-08-07 DIAGNOSIS — I509 Heart failure, unspecified: Secondary | ICD-10-CM | POA: Insufficient documentation

## 2020-08-07 LAB — COMPREHENSIVE METABOLIC PANEL
ALT: 13 U/L (ref 0–44)
AST: 19 U/L (ref 15–41)
Albumin: 4 g/dL (ref 3.5–5.0)
Alkaline Phosphatase: 72 U/L (ref 38–126)
Anion gap: 12 (ref 5–15)
BUN: 30 mg/dL — ABNORMAL HIGH (ref 8–23)
CO2: 23 mmol/L (ref 22–32)
Calcium: 9.5 mg/dL (ref 8.9–10.3)
Chloride: 104 mmol/L (ref 98–111)
Creatinine, Ser: 0.84 mg/dL (ref 0.61–1.24)
GFR, Estimated: >60 mL/min (ref >60)
Glucose, Bld: 241 mg/dL — ABNORMAL HIGH (ref 70–99)
Potassium: 4.3 mmol/L (ref 3.5–5.1)
Sodium: 139 mmol/L (ref 135–145)
Total Bilirubin: 0.6 mg/dL (ref 0.3–1.2)
Total Protein: 7.6 g/dL (ref 6.5–8.1)

## 2020-08-07 LAB — CBC WITH DIFFERENTIAL/PLATELET
Abs Immature Granulocytes: 0.01 10*3/uL (ref 0.00–0.07)
Basophils Absolute: 0 10*3/uL (ref 0.0–0.1)
Basophils Relative: 0 %
Eosinophils Absolute: 0.1 10*3/uL (ref 0.0–0.5)
Eosinophils Relative: 3 %
HCT: 32.1 % — ABNORMAL LOW (ref 39.0–52.0)
Hemoglobin: 10.6 g/dL — ABNORMAL LOW (ref 13.0–17.0)
Immature Granulocytes: 0 %
Lymphocytes Relative: 17 %
Lymphs Abs: 0.6 10*3/uL — ABNORMAL LOW (ref 0.7–4.0)
MCH: 30.8 pg (ref 26.0–34.0)
MCHC: 33 g/dL (ref 30.0–36.0)
MCV: 93.3 fL (ref 80.0–100.0)
Monocytes Absolute: 0.3 10*3/uL (ref 0.1–1.0)
Monocytes Relative: 10 %
Neutro Abs: 2.4 10*3/uL (ref 1.7–7.7)
Neutrophils Relative %: 70 %
Platelets: 150 10*3/uL (ref 150–400)
RBC: 3.44 MIL/uL — ABNORMAL LOW (ref 4.22–5.81)
RDW: 12.6 % (ref 11.5–15.5)
WBC: 3.5 10*3/uL — ABNORMAL LOW (ref 4.0–10.5)
nRBC: 0 % (ref 0.0–0.2)

## 2020-08-07 LAB — PSA: Prostatic Specific Antigen: 0.04 ng/mL (ref 0.00–4.00)

## 2020-08-07 NOTE — Progress Notes (Signed)
Hematology/Oncology  note [facility] Telephone:([phone] [phone]   Patient Care Team: Inc, [place] as PCP - General Matthew Cowman, Mcgrath as Consulting Physician (Cardiology) Matthew Graff, FNP as Nurse Practitioner (Family Medicine)  REFERRING PROVIDER: Inc, [place]*  CHIEF COMPLAINTS/REASON FOR VISIT:  metastatic prostate cancer  HISTORY OF PRESENTING ILLNESS:   Matthew Mcgrath is a  75 y.o.  male with PMH listed below was seen in consultation at the request of  Inc, [place]  for evaluation of metastatic prostate cancer. Patient was accompanied by her sister who he currently lives with.  History was mainly obtained from sister as well as reviewing medical records.  Patient is a poor historian. Per sister, she has noticed patient having increased urgency and frequency of urination.  Patient was evaluated by urologist Matthew Mcgrath in January 2022.  PSA was elevated at 30.4 in January 2022 04/01/2020, prostate biopsy showed  All cores were positive for high risk prostate cancer, primarily Gleason score 5+4 = 9, with max core involvement of 100% and perineural invasion present. 04/21/2020, CT abdomen pelvis showed a sclerotic lesion in the lumbar spine concerning for probable metastatic disease.  No definitive extra skeletal metastatic disease noted elsewhere in the abdomen or pelvis.  Mild circumferential bladder wall thickening.  Aortic atherosclerosis. 04/27/2020, bone scan showed foci of abnormal uptake noted in the mid thoracic and upper lumbar spine concerning for metastatic disease.  Patient reports some lower back pain intermittently.  He currently lives with his sister.   Past medical history includes CHF, hypertension, hyper lipidemia, diabetes type 2 Per sister, family history is positive for colon cancer and another brother, multiple myeloma in the other brother.  04/16/2020 Firmagon 240 mg loading  dose #06/11/2020 patient went to [facility] for second opinion was seen by Matthew Mcgrath who agrees with continuing ADT with switch to Eligard 45 mg.  He is due to proceed with the treatments today.  Communicated with [facility] Matthew Mcgrath, patient will receive treatment at Matthew Mcgrath. Matthew Mcgrath also recommends to start darolutamide and authorization process has been initiated at [facility]. #07/06/2020 started darolutamide 600 mg twice daily # definitive treatment for prostate and pelvic node- Discussed with [facility] Matthew Mcgrath and I appreciate his expert opinion of darolutamide concurrent use with RT and ADT   INTERVAL HISTORY Matthew Mcgrath is a 75 y.o. male who has above history reviewed by me today presents for follow up visit for management of metastatic prostate cancer Problems and complaints are listed below: Patient is here by himself,, he did not get the blood work done today.  I called patient's sister Matthew Mcgrath multiple times not able to reach her. on darolutamide 600 twice daily-through [facility].  He is currently ondefinitive treatment for prostate and pelvic node    Patient reports no new complaints-poor historian  Review of Systems  Constitutional: Negative for appetite change, chills, fatigue, fever and unexpected weight change.  HENT:   Negative for hearing loss and voice change.   Eyes: Negative for eye problems and icterus.  Respiratory: Negative for chest tightness, cough and shortness of breath.   Cardiovascular: Negative for chest pain and leg swelling.  Gastrointestinal: Negative for abdominal distention and abdominal pain.  Endocrine: Negative for hot flashes.  Genitourinary: Negative for difficulty urinating, dysuria and frequency.   Musculoskeletal: Positive for back pain. Negative for arthralgias.  Skin: Negative for itching and rash.  Neurological: Negative for light-headedness and numbness.  Hematological: Negative for adenopathy. Does  not bruise/bleed easily.  Psychiatric/Behavioral: Negative for  confusion.    MEDICAL HISTORY:  Past Medical History:  Diagnosis Date  . Cardiomyopathy ([place])   . Congestive heart failure ([place])   . Diabetes mellitus type 2, controlled ([place])   . Elevated lipids   . HTN (hypertension)     SURGICAL HISTORY: Past Surgical History:  Procedure Laterality Date  . CARDIAC CATHETERIZATION N/A 04/14/2015   Procedure: Left Heart Cath and Coronary Angiography;  Surgeon: Matthew Cowman, Mcgrath;  Location: [facility];  Service: Cardiovascular;  Laterality: N/A;  . CATARACT EXTRACTION W/ INTRAOCULAR LENS  IMPLANT, BILATERAL Left   . COLONOSCOPY WITH PROPOFOL N/A 04/08/2020   Procedure: COLONOSCOPY WITH PROPOFOL;  Surgeon: Matthew Mcgrath;  Location: [facility] ENDOSCOPY;  Service: Gastroenterology;  Laterality: N/A;  . HERNIA REPAIR    . IMPLANTABLE CARDIOVERTER DEFIBRILLATOR (ICD) GENERATOR CHANGE Left 07/03/2015   Procedure: ICD IMPLANT single chamber;  Surgeon: Matthew Board, Mcgrath;  Location: [facility] ORS;  Service: Cardiovascular;  Laterality: Left;    SOCIAL HISTORY: Social History   Socioeconomic History  . Marital status: Single    Spouse name: Not on file  . Number of children: Not on file  . Years of education: Not on file  . Highest education level: Not on file  Occupational History  . Not on file  Tobacco Use  . Smoking status: Former Smoker    Packs/day: 0.25    Years: 30.00    Pack years: 7.50    Types: Cigarettes    Quit date: 02/11/2020    Years since quitting: 0.4  . Smokeless tobacco: Never Used  Vaping Use  . Vaping Use: Never used  Substance and Sexual Activity  . Alcohol use: No  . Drug use: Not Currently    Frequency: 2.0 times per week    Types: Marijuana    Comment: past use  . Sexual activity: Not on file  Other Topics Concern  . Not on file  Social History Narrative  . Not on file   Social Determinants of Health   Financial Resource Strain: Not on file  Food Insecurity: Not on file  Transportation Needs:  Not on file  Physical Activity: Not on file  Stress: Not on file  Social Connections: Not on file  Intimate Partner Violence: Not on file    FAMILY HISTORY: Family History  Problem Relation Age of Onset  . Heart failure Mother   . Heart disease Mother   . Alcoholism Father   . Hypertension Sister   . Diabetes Sister   . Hypertension Brother   . Diabetes Brother   . Colon cancer Brother   . Multiple myeloma Brother     ALLERGIES:  has No Known Allergies.  MEDICATIONS:  Current Outpatient Medications  Medication Sig Dispense Refill  . acetaminophen (TYLENOL) 650 MG CR tablet Take 650 mg by mouth every 8 (eight) hours as needed for pain.    Marland Kitchen aspirin EC 81 MG tablet @@TAKE  1 TABLET BY MOUTH ONCE DAILY.    . carvedilol (COREG) 25 MG tablet Take 25 mg by mouth 2 (two) times daily with a meal.    . cholecalciferol (VITAMIN D3) 25 MCG (1000 UNIT) tablet Take 1,000 Units by mouth daily.    . darolutamide (NUBEQA) 300 MG tablet Take by mouth.    . Docosahexaenoic Acid (DHA OMEGA 3 PO) 1,000 mg.    . furosemide (LASIX) 40 MG tablet Take 40 mg by mouth daily.     Marland Kitchen  glipiZIDE (GLUCOTROL) 5 MG tablet Take by mouth daily before breakfast.    . glucose blood (TRUE METRIX BLOOD GLUCOSE TEST) test strip 1 each by Other route as needed for other. Use as instructed    . LIDODERM 5 % Apply 1 patch to skin every morning as needed for L lower back pain. Remove after 12 hours. Call for refills    . lisinopril (ZESTRIL) 10 MG tablet Take 10 mg by mouth daily.     . magnesium oxide (MAG-OX) 400 MG tablet Take 400 mg by mouth 2 (two) times daily.    . metFORMIN (GLUCOPHAGE) 500 MG tablet Take by mouth 2 (two) times daily with a meal.    . MIRALAX 17 GM/SCOOP powder Take 17 g by mouth daily.    . Multiple Vitamin (MULTIVITAMIN ADULT PO) SMARTSIG:1 Tablet(s) By Mouth Daily    . Omega-3 Fatty Acids (FISH OIL PO) Take by mouth.    . potassium chloride (KLOR-CON) 10 MEQ tablet Take 10 mEq by mouth daily.     . simvastatin (ZOCOR) 40 MG tablet Take 40 mg by mouth daily at 6 PM.     . spironolactone (ALDACTONE) 25 MG tablet Take 25 mg by mouth daily.    . TRUEplus Lancets 28G MISC by Does not apply route.     No current facility-administered medications for this visit.     PHYSICAL EXAMINATION: ECOG PERFORMANCE STATUS: 1 - Symptomatic but completely ambulatory Vitals:   08/07/20 1131  BP: 121/76  Pulse: 71  Resp: 16  Temp: (!) 95.8 F (35.4 C)   Filed Weights   08/07/20 1131  Weight: 194 lb (88 kg)    Physical Exam Constitutional:      General: He is not in acute distress. HENT:     Head: Normocephalic and atraumatic.  Eyes:     General: No scleral icterus. Cardiovascular:     Rate and Rhythm: Normal rate and regular rhythm.     Heart sounds: Normal heart sounds.  Pulmonary:     Effort: Pulmonary effort is normal. No respiratory distress.     Breath sounds: No wheezing.  Abdominal:     General: Bowel sounds are normal. There is no distension.     Palpations: Abdomen is soft.  Musculoskeletal:        General: No deformity. Normal range of motion.     Cervical back: Normal range of motion and neck supple.  Skin:    General: Skin is warm and dry.     Findings: No erythema or rash.  Neurological:     Mental Status: He is alert and oriented to person, place, and time. Mental status is at baseline.     Cranial Nerves: No cranial nerve deficit.     Coordination: Coordination normal.  Psychiatric:        Mood and Affect: Mood normal.     LABORATORY DATA:  I have reviewed the data as listed Lab Results  Component Value Date   WBC 3.5 (L) 08/07/2020   HGB 10.6 (L) 08/07/2020   HCT 32.1 (L) 08/07/2020   MCV 93.3 08/07/2020   PLT 150 08/07/2020   Recent Labs    06/11/20 0939 07/06/20 1114 08/07/20 1213  NA 136 136 139  K 4.8 4.5 4.3  CL 101 97* 104  CO2 26 27 23   GLUCOSE 239* 217* 241*  BUN 19 29* 30*  CREATININE 0.83 0.90 0.84  CALCIUM 9.4 9.3 9.5   GFRNONAA >60 >60 >60  PROT  7.3 7.6 7.6  ALBUMIN 3.9 4.0 4.0  AST 19 17 19   ALT 14 15 13   ALKPHOS 54 67 72  BILITOT 0.7 0.8 0.6   Iron/TIBC/Ferritin/ %Sat No results found for: IRON, TIBC, FERRITIN, IRONPCTSAT   Tests done at [facility] 06/10/20  Tests: (1) Comp. Metabolic Panel (14) (256389)  Glucose       [H] 220 mg/dL          65-99  BUN            18 mg/dL          8-27  Creatinine        0.84 mg/dL         0.76-1.27  eGFR           93 mL/min/1.73       >59  BUN/Creatinine Ratio   21             10-24  Sodium          136 mmol/L         134-144  Potassium         4.9 mmol/L         3.5-5.2  Chloride         98 mmol/L          96-106  Carbon Dioxide, Total             [L] 19 mmol/L          20-29  Calcium          9.8 mg/dL          8.6-10.2  Protein, Total      7.0 g/dL          6.0-8.5  Albumin          4.4 g/dL          3.7-4.7  Globulin, Total      2.6 g/dL          1.5-4.5  A/G Ratio         1.7             1.2-2.2  Bilirubin, Total     0.4 mg/dL          0.0-1.2  Alkaline Phosphatase   65 IU/L           44-121  AST (SGOT)        15 IU/L           0-40  ALT (SGPT)        13 IU/L           0-44   Tests: (1) CBC With Differential/Platelet (005009)  WBC            4.9 x10E3/uL        3.4-10.8  RBC         [L] 4.12 x10E6/uL        4.14-5.80  Hemoglobin      [L] 12.5 g/dL          13.0-17.7  Hematocrit        38.7 %           37.5-51.0  MCV            94 fL            79-97  MCH            30.3 pg            26.6-33.0  MCHC  32.3 g/dL          31.5-35.7  RDW            11.6 %           11.6-15.4  Platelets         200 x10E3/uL        150-450  Neutrophils        55 %            Not Estab.  Lymphs          32 %            Not Estab.  Monocytes         9 %             Not Estab.  Eos            3 %             Not Estab.  Basos           1 %             Not Estab.  Neutrophils (Absolute)               2.8 x10E3/uL        1.4-7.0  Lymphs (Absolute)     1.6 x10E3/uL        0.7-3.1  Monocytes(Absolute)    0.4 x10E3/uL        0.1-0.9  Eos (Absolute)      0.1 x10E3/uL        0.0-0.4  Baso (Absolute)      0.0 x10E3/uL        0.0-0.2  Immature Granulocytes               0 %             Not Estab.  Immature Grans (Abs)   0.0 x10E3/uL        0.0-0.1    PSA 4.8   RADIOGRAPHIC STUDIES: I have personally reviewed the radiological images as listed and agreed with the findings in the report. No results found.    ASSESSMENT & PLAN:  1. Prostate cancer ([place])   2. Metastatic cancer to bone ([place])   3. Encounter for antineoplastic chemotherapy   Cancer Staging Prostate cancer [facility]) Staging form: Prostate, AJCC 8th Edition - Clinical stage from 05/11/2020: Stage IVB (cTX, cN0, cM1, PSA: 30) - Signed by [name], Mcgrath on 05/11/2020   #Metastatic prostate cancer to bone. Check CBC CMP PSA. -Labs are reviewed.  PSA has decreased to 0.04 Eligard 45 mg Q6 months with [facility]-06/11/2020 Continue darolutamide.  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 TMB 3.1 mut/Mb, MS stable, PD-L1 1% Consider possible rucaparib/olaparib in subsequent lines of treatment I  recommend genetic testing.  #Follow-up with radiation oncology to finish Discussed definitive prostate cancer and pelvic lymph node treatments.  Supportive care measures are necessary for patient well-being and will be provided as necessary. We spent sufficient time to discuss many aspect of care, questions were answered to patient's satisfaction.   All questions were answered. The patient knows to call the clinic with any problems questions or concerns. cc  Inc, [place]   Return of visit: 8 weeks    ZEarlie Server MD, PhD Hematology Oncology CRegional Mental Health Centerat AContinuous Care Center Of TulsaPager- [phone]/05/2020

## 2020-08-07 NOTE — Progress Notes (Signed)
Patient has occasional leg weakness.

## 2020-08-08 ENCOUNTER — Encounter: Payer: Self-pay | Admitting: Oncology

## 2020-08-10 ENCOUNTER — Ambulatory Visit
Admission: RE | Admit: 2020-08-10 | Discharge: 2020-08-10 | Disposition: A | Discharge status: Home / Self Care | Payer: Medicare (Managed Care) | Source: Ambulatory Visit | Attending: Radiation Oncology | Admitting: Radiation Oncology

## 2020-08-10 ENCOUNTER — Inpatient Hospital Stay: Payer: Medicare (Managed Care)

## 2020-08-10 DIAGNOSIS — C61 Malignant neoplasm of prostate: Secondary | ICD-10-CM | POA: Diagnosis not present

## 2020-08-11 ENCOUNTER — Ambulatory Visit
Admission: RE | Admit: 2020-08-11 | Discharge: 2020-08-11 | Disposition: A | Discharge status: Home / Self Care | Payer: Medicare (Managed Care) | Source: Ambulatory Visit | Attending: Radiation Oncology | Admitting: Radiation Oncology

## 2020-08-11 DIAGNOSIS — C61 Malignant neoplasm of prostate: Secondary | ICD-10-CM | POA: Diagnosis not present

## 2020-08-12 ENCOUNTER — Ambulatory Visit
Admission: RE | Admit: 2020-08-12 | Discharge: 2020-08-12 | Disposition: A | Discharge status: Home / Self Care | Payer: Medicare (Managed Care) | Source: Ambulatory Visit | Attending: Radiation Oncology | Admitting: Radiation Oncology

## 2020-08-12 DIAGNOSIS — C61 Malignant neoplasm of prostate: Secondary | ICD-10-CM | POA: Diagnosis not present

## 2020-08-13 ENCOUNTER — Ambulatory Visit
Admission: RE | Admit: 2020-08-13 | Discharge: 2020-08-13 | Disposition: A | Discharge status: Home / Self Care | Payer: Medicare (Managed Care) | Source: Ambulatory Visit | Attending: Radiation Oncology | Admitting: Radiation Oncology

## 2020-08-13 DIAGNOSIS — C61 Malignant neoplasm of prostate: Secondary | ICD-10-CM | POA: Diagnosis not present

## 2020-08-14 ENCOUNTER — Ambulatory Visit
Admission: RE | Admit: 2020-08-14 | Discharge: 2020-08-14 | Disposition: A | Discharge status: Home / Self Care | Payer: Medicare (Managed Care) | Source: Ambulatory Visit | Attending: Radiation Oncology | Admitting: Radiation Oncology

## 2020-08-14 DIAGNOSIS — C61 Malignant neoplasm of prostate: Secondary | ICD-10-CM | POA: Diagnosis not present

## 2020-08-17 ENCOUNTER — Ambulatory Visit
Admission: RE | Admit: 2020-08-17 | Discharge: 2020-08-17 | Disposition: A | Discharge status: Home / Self Care | Payer: Medicare (Managed Care) | Source: Ambulatory Visit | Attending: Radiation Oncology | Admitting: Radiation Oncology

## 2020-08-17 DIAGNOSIS — C61 Malignant neoplasm of prostate: Secondary | ICD-10-CM | POA: Diagnosis not present

## 2020-08-18 ENCOUNTER — Ambulatory Visit
Admission: RE | Admit: 2020-08-18 | Discharge: 2020-08-18 | Disposition: A | Discharge status: Home / Self Care | Payer: Medicare (Managed Care) | Source: Ambulatory Visit | Attending: Radiation Oncology | Admitting: Radiation Oncology

## 2020-08-18 DIAGNOSIS — C61 Malignant neoplasm of prostate: Secondary | ICD-10-CM | POA: Diagnosis not present

## 2020-08-19 ENCOUNTER — Ambulatory Visit
Admission: RE | Admit: 2020-08-19 | Discharge: 2020-08-19 | Disposition: A | Discharge status: Home / Self Care | Payer: Medicare (Managed Care) | Source: Ambulatory Visit | Attending: Radiation Oncology | Admitting: Radiation Oncology

## 2020-08-19 DIAGNOSIS — C61 Malignant neoplasm of prostate: Secondary | ICD-10-CM | POA: Diagnosis not present

## 2020-08-20 ENCOUNTER — Ambulatory Visit
Admission: RE | Admit: 2020-08-20 | Discharge: 2020-08-20 | Disposition: A | Discharge status: Home / Self Care | Payer: Medicare (Managed Care) | Source: Ambulatory Visit | Attending: Radiation Oncology | Admitting: Radiation Oncology

## 2020-08-20 DIAGNOSIS — C61 Malignant neoplasm of prostate: Secondary | ICD-10-CM | POA: Diagnosis not present

## 2020-08-21 ENCOUNTER — Ambulatory Visit
Admission: RE | Admit: 2020-08-21 | Discharge: 2020-08-21 | Disposition: A | Discharge status: Home / Self Care | Payer: Medicare (Managed Care) | Source: Ambulatory Visit | Attending: Radiation Oncology | Admitting: Radiation Oncology

## 2020-08-21 DIAGNOSIS — C61 Malignant neoplasm of prostate: Secondary | ICD-10-CM | POA: Diagnosis not present

## 2020-08-24 ENCOUNTER — Inpatient Hospital Stay: Payer: Medicare (Managed Care)

## 2020-08-24 ENCOUNTER — Ambulatory Visit
Admission: RE | Admit: 2020-08-24 | Discharge: 2020-08-24 | Disposition: A | Discharge status: Home / Self Care | Payer: Medicare (Managed Care) | Source: Ambulatory Visit | Attending: Radiation Oncology | Admitting: Radiation Oncology

## 2020-08-24 DIAGNOSIS — C61 Malignant neoplasm of prostate: Secondary | ICD-10-CM | POA: Diagnosis not present

## 2020-08-25 ENCOUNTER — Ambulatory Visit
Admission: RE | Admit: 2020-08-25 | Discharge: 2020-08-25 | Disposition: A | Discharge status: Home / Self Care | Payer: Medicare (Managed Care) | Source: Ambulatory Visit | Attending: Radiation Oncology | Admitting: Radiation Oncology

## 2020-08-25 DIAGNOSIS — C61 Malignant neoplasm of prostate: Secondary | ICD-10-CM | POA: Diagnosis not present

## 2020-08-26 ENCOUNTER — Ambulatory Visit
Admission: RE | Admit: 2020-08-26 | Discharge: 2020-08-26 | Disposition: A | Discharge status: Home / Self Care | Payer: Medicare (Managed Care) | Source: Ambulatory Visit | Attending: Radiation Oncology | Admitting: Radiation Oncology

## 2020-08-26 DIAGNOSIS — C61 Malignant neoplasm of prostate: Secondary | ICD-10-CM | POA: Diagnosis not present

## 2020-08-27 ENCOUNTER — Ambulatory Visit
Admission: RE | Admit: 2020-08-27 | Discharge: 2020-08-27 | Disposition: A | Discharge status: Home / Self Care | Payer: Medicare (Managed Care) | Source: Ambulatory Visit | Attending: Radiation Oncology | Admitting: Radiation Oncology

## 2020-08-27 DIAGNOSIS — C61 Malignant neoplasm of prostate: Secondary | ICD-10-CM | POA: Diagnosis not present

## 2020-08-28 ENCOUNTER — Ambulatory Visit
Admission: RE | Admit: 2020-08-28 | Discharge: 2020-08-28 | Disposition: A | Discharge status: Home / Self Care | Payer: Medicare (Managed Care) | Source: Ambulatory Visit | Attending: Radiation Oncology | Admitting: Radiation Oncology

## 2020-08-28 DIAGNOSIS — C61 Malignant neoplasm of prostate: Secondary | ICD-10-CM | POA: Diagnosis not present

## 2020-08-31 ENCOUNTER — Ambulatory Visit
Admission: RE | Admit: 2020-08-31 | Discharge: 2020-08-31 | Disposition: A | Discharge status: Home / Self Care | Payer: Medicare (Managed Care) | Source: Ambulatory Visit | Attending: Radiation Oncology | Admitting: Radiation Oncology

## 2020-08-31 DIAGNOSIS — C61 Malignant neoplasm of prostate: Secondary | ICD-10-CM | POA: Diagnosis not present

## 2020-09-01 ENCOUNTER — Ambulatory Visit
Admission: RE | Admit: 2020-09-01 | Discharge: 2020-09-01 | Disposition: A | Discharge status: Home / Self Care | Payer: Medicare (Managed Care) | Source: Ambulatory Visit | Attending: Radiation Oncology | Admitting: Radiation Oncology

## 2020-09-01 DIAGNOSIS — C61 Malignant neoplasm of prostate: Secondary | ICD-10-CM | POA: Diagnosis not present

## 2020-09-02 ENCOUNTER — Ambulatory Visit
Admission: RE | Admit: 2020-09-02 | Discharge: 2020-09-02 | Disposition: A | Discharge status: Home / Self Care | Payer: Medicare (Managed Care) | Source: Ambulatory Visit | Attending: Radiation Oncology | Admitting: Radiation Oncology

## 2020-09-02 DIAGNOSIS — C61 Malignant neoplasm of prostate: Secondary | ICD-10-CM | POA: Diagnosis not present

## 2020-09-03 ENCOUNTER — Ambulatory Visit
Admission: RE | Admit: 2020-09-03 | Discharge: 2020-09-03 | Disposition: A | Discharge status: Home / Self Care | Payer: Medicare (Managed Care) | Source: Ambulatory Visit | Attending: Radiation Oncology | Admitting: Radiation Oncology

## 2020-09-03 DIAGNOSIS — C61 Malignant neoplasm of prostate: Secondary | ICD-10-CM | POA: Diagnosis not present

## 2020-09-04 ENCOUNTER — Ambulatory Visit
Admission: RE | Admit: 2020-09-04 | Discharge: 2020-09-04 | Disposition: A | Discharge status: Home / Self Care | Payer: Medicare (Managed Care) | Source: Ambulatory Visit | Attending: Radiation Oncology | Admitting: Radiation Oncology

## 2020-09-04 DIAGNOSIS — C7951 Secondary malignant neoplasm of bone: Secondary | ICD-10-CM | POA: Diagnosis not present

## 2020-09-04 DIAGNOSIS — C61 Malignant neoplasm of prostate: Secondary | ICD-10-CM | POA: Diagnosis present

## 2020-09-04 DIAGNOSIS — C775 Secondary and unspecified malignant neoplasm of intrapelvic lymph nodes: Secondary | ICD-10-CM | POA: Insufficient documentation

## 2020-09-08 ENCOUNTER — Ambulatory Visit
Admission: RE | Admit: 2020-09-08 | Discharge: 2020-09-08 | Disposition: A | Discharge status: Home / Self Care | Payer: Medicare (Managed Care) | Source: Ambulatory Visit | Attending: Radiation Oncology | Admitting: Radiation Oncology

## 2020-09-08 DIAGNOSIS — C61 Malignant neoplasm of prostate: Secondary | ICD-10-CM | POA: Diagnosis not present

## 2020-09-14 ENCOUNTER — Ambulatory Visit
Admission: RE | Admit: 2020-09-14 | Discharge: 2020-09-14 | Disposition: A | Discharge status: Home / Self Care | Payer: Medicare (Managed Care) | Source: Ambulatory Visit | Attending: Radiation Oncology | Admitting: Radiation Oncology

## 2020-09-14 DIAGNOSIS — C61 Malignant neoplasm of prostate: Secondary | ICD-10-CM | POA: Diagnosis not present

## 2020-09-16 ENCOUNTER — Ambulatory Visit
Admission: RE | Admit: 2020-09-16 | Discharge: 2020-09-16 | Disposition: A | Discharge status: Home / Self Care | Payer: Medicare (Managed Care) | Source: Ambulatory Visit | Attending: Radiation Oncology | Admitting: Radiation Oncology

## 2020-09-16 DIAGNOSIS — C61 Malignant neoplasm of prostate: Secondary | ICD-10-CM | POA: Diagnosis not present

## 2020-09-21 ENCOUNTER — Ambulatory Visit
Admission: RE | Admit: 2020-09-21 | Discharge: 2020-09-21 | Disposition: A | Discharge status: Home / Self Care | Payer: Medicare (Managed Care) | Source: Ambulatory Visit | Attending: Radiation Oncology | Admitting: Radiation Oncology

## 2020-09-21 DIAGNOSIS — C61 Malignant neoplasm of prostate: Secondary | ICD-10-CM | POA: Diagnosis not present

## 2020-09-23 ENCOUNTER — Ambulatory Visit
Admission: RE | Admit: 2020-09-23 | Discharge: 2020-09-23 | Disposition: A | Discharge status: Home / Self Care | Payer: Medicare (Managed Care) | Source: Ambulatory Visit | Attending: Radiation Oncology | Admitting: Radiation Oncology

## 2020-09-23 ENCOUNTER — Ambulatory Visit: Payer: Medicare (Managed Care)

## 2020-09-23 DIAGNOSIS — C61 Malignant neoplasm of prostate: Secondary | ICD-10-CM | POA: Diagnosis not present

## 2020-09-28 ENCOUNTER — Ambulatory Visit
Admission: RE | Admit: 2020-09-28 | Discharge: 2020-09-28 | Disposition: A | Discharge status: Home / Self Care | Payer: Medicare (Managed Care) | Source: Ambulatory Visit | Attending: Radiation Oncology | Admitting: Radiation Oncology

## 2020-09-28 DIAGNOSIS — C61 Malignant neoplasm of prostate: Secondary | ICD-10-CM | POA: Diagnosis not present

## 2020-09-30 DIAGNOSIS — C61 Malignant neoplasm of prostate: Secondary | ICD-10-CM | POA: Diagnosis not present

## 2020-10-05 ENCOUNTER — Ambulatory Visit: Payer: Medicare (Managed Care)

## 2020-10-06 ENCOUNTER — Ambulatory Visit: Payer: Medicare (Managed Care)

## 2020-10-07 ENCOUNTER — Inpatient Hospital Stay: Payer: Medicare (Managed Care) | Attending: Oncology

## 2020-10-07 ENCOUNTER — Ambulatory Visit: Payer: Medicare (Managed Care)

## 2020-10-07 ENCOUNTER — Ambulatory Visit
Admission: RE | Admit: 2020-10-07 | Discharge: 2020-10-07 | Disposition: A | Discharge status: Home / Self Care | Payer: Medicare (Managed Care) | Source: Ambulatory Visit | Attending: Radiation Oncology | Admitting: Radiation Oncology

## 2020-10-07 DIAGNOSIS — C61 Malignant neoplasm of prostate: Secondary | ICD-10-CM | POA: Insufficient documentation

## 2020-10-07 DIAGNOSIS — C7951 Secondary malignant neoplasm of bone: Secondary | ICD-10-CM | POA: Diagnosis not present

## 2020-10-07 DIAGNOSIS — C775 Secondary and unspecified malignant neoplasm of intrapelvic lymph nodes: Secondary | ICD-10-CM | POA: Diagnosis not present

## 2020-10-07 DIAGNOSIS — D649 Anemia, unspecified: Secondary | ICD-10-CM | POA: Insufficient documentation

## 2020-10-07 DIAGNOSIS — I110 Hypertensive heart disease with heart failure: Secondary | ICD-10-CM | POA: Insufficient documentation

## 2020-10-07 DIAGNOSIS — Z510 Encounter for antineoplastic radiation therapy: Secondary | ICD-10-CM | POA: Diagnosis present

## 2020-10-07 DIAGNOSIS — E119 Type 2 diabetes mellitus without complications: Secondary | ICD-10-CM | POA: Diagnosis not present

## 2020-10-07 LAB — COMPREHENSIVE METABOLIC PANEL
ALT: 13 U/L (ref 0–44)
AST: 19 U/L (ref 15–41)
Albumin: 3.9 g/dL (ref 3.5–5.0)
Alkaline Phosphatase: 61 U/L (ref 38–126)
Anion gap: 9 (ref 5–15)
BUN: 36 mg/dL — ABNORMAL HIGH (ref 8–23)
CO2: 24 mmol/L (ref 22–32)
Calcium: 9.2 mg/dL (ref 8.9–10.3)
Chloride: 99 mmol/L (ref 98–111)
Creatinine, Ser: 1.05 mg/dL (ref 0.61–1.24)
GFR, Estimated: >60 mL/min (ref >60)
Glucose, Bld: 229 mg/dL — ABNORMAL HIGH (ref 70–99)
Potassium: 4.5 mmol/L (ref 3.5–5.1)
Sodium: 132 mmol/L — ABNORMAL LOW (ref 135–145)
Total Bilirubin: 0.3 mg/dL (ref 0.3–1.2)
Total Protein: 7.4 g/dL (ref 6.5–8.1)

## 2020-10-07 LAB — CBC WITH DIFFERENTIAL/PLATELET
Abs Immature Granulocytes: 0.01 10*3/uL (ref 0.00–0.07)
Basophils Absolute: 0 10*3/uL (ref 0.0–0.1)
Basophils Relative: 0 %
Eosinophils Absolute: 0.1 10*3/uL (ref 0.0–0.5)
Eosinophils Relative: 3 %
HCT: 30.2 % — ABNORMAL LOW (ref 39.0–52.0)
Hemoglobin: 9.6 g/dL — ABNORMAL LOW (ref 13.0–17.0)
Immature Granulocytes: 0 %
Lymphocytes Relative: 7 %
Lymphs Abs: 0.2 10*3/uL — ABNORMAL LOW (ref 0.7–4.0)
MCH: 31.5 pg (ref 26.0–34.0)
MCHC: 31.8 g/dL (ref 30.0–36.0)
MCV: 99 fL (ref 80.0–100.0)
Monocytes Absolute: 0.3 10*3/uL (ref 0.1–1.0)
Monocytes Relative: 10 %
Neutro Abs: 2.6 10*3/uL (ref 1.7–7.7)
Neutrophils Relative %: 80 %
Platelets: 146 10*3/uL — ABNORMAL LOW (ref 150–400)
RBC: 3.05 MIL/uL — ABNORMAL LOW (ref 4.22–5.81)
RDW: 13.7 % (ref 11.5–15.5)
WBC: 3.3 10*3/uL — ABNORMAL LOW (ref 4.0–10.5)
nRBC: 0 % (ref 0.0–0.2)

## 2020-10-07 LAB — PSA: Prostatic Specific Antigen: 0.02 ng/mL (ref 0.00–4.00)

## 2020-10-08 ENCOUNTER — Ambulatory Visit: Payer: Medicare (Managed Care)

## 2020-10-09 ENCOUNTER — Encounter: Payer: Self-pay | Admitting: Oncology

## 2020-10-09 ENCOUNTER — Ambulatory Visit
Admission: RE | Admit: 2020-10-09 | Discharge: 2020-10-09 | Disposition: A | Discharge status: Home / Self Care | Payer: Medicare (Managed Care) | Source: Ambulatory Visit | Attending: Radiation Oncology | Admitting: Radiation Oncology

## 2020-10-09 ENCOUNTER — Inpatient Hospital Stay (HOSPITAL_BASED_OUTPATIENT_CLINIC_OR_DEPARTMENT_OTHER): Payer: Medicare (Managed Care) | Admitting: Oncology

## 2020-10-09 VITALS — BP 96/67 | HR 73 | Temp 97.8°F | Resp 18 | Wt 190.2 lb

## 2020-10-09 DIAGNOSIS — C7951 Secondary malignant neoplasm of bone: Secondary | ICD-10-CM | POA: Diagnosis not present

## 2020-10-09 DIAGNOSIS — Z5111 Encounter for antineoplastic chemotherapy: Secondary | ICD-10-CM

## 2020-10-09 DIAGNOSIS — D649 Anemia, unspecified: Secondary | ICD-10-CM | POA: Diagnosis not present

## 2020-10-09 DIAGNOSIS — C61 Malignant neoplasm of prostate: Secondary | ICD-10-CM | POA: Diagnosis not present

## 2020-10-09 DIAGNOSIS — Z510 Encounter for antineoplastic radiation therapy: Secondary | ICD-10-CM | POA: Diagnosis not present

## 2020-10-09 NOTE — Progress Notes (Signed)
Hematology/Oncology  note [facility] Telephone:([phone] [phone]   Patient Care Team: Inc, [place] as PCP - General [name], MD as Consulting Physician (Cardiology) Alisa Graff, FNP as Nurse Practitioner (Family Medicine)  REFERRING PROVIDER: Inc, Martorell Se*  CHIEF COMPLAINTS/REASON FOR VISIT:  metastatic prostate cancer  HISTORY OF PRESENTING ILLNESS:   Matthew Mcgrath is a  75 y.o.  male with PMH listed below was seen in consultation at the request of  Inc, [place]  for evaluation of metastatic prostate cancer. Patient was accompanied by her sister who he currently lives with.  History was mainly obtained from sister as well as reviewing medical records.  Patient is a poor historian. Per sister, she has noticed patient having increased urgency and frequency of urination.  Patient was evaluated by urologist Dr. Diamantina Providence in January 2022.  PSA was elevated at 30.4 in January 2022 04/01/2020, prostate biopsy showed  All cores were positive for high risk prostate cancer, primarily Gleason score 5+4 = 9, with max core involvement of 100% and perineural invasion present. 04/21/2020, CT abdomen pelvis showed a sclerotic lesion in the lumbar spine concerning for probable metastatic disease.  No definitive extra skeletal metastatic disease noted elsewhere in the abdomen or pelvis.  Mild circumferential bladder wall thickening.  Aortic atherosclerosis. 04/27/2020, bone scan showed foci of abnormal uptake noted in the mid thoracic and upper lumbar spine concerning for metastatic disease.  Patient reports some lower back pain intermittently.  He currently lives with his sister.   Past medical history includes CHF, hypertension, hyper lipidemia, diabetes type 2 Per sister, family history is positive for colon cancer and another brother, multiple myeloma in the other brother.  04/16/2020 Firmagon 240 mg loading  dose #06/11/2020 patient went to [facility] for second opinion was seen by [name] who agrees with continuing ADT with switch to Eligard 45 mg.  He is due to proceed with the treatments today.  [facility]icated with [facility] [name], patient will receive treatment at [place]. [name] also recommends to start darolutamide and authorization process has been initiated at [facility]. #07/06/2020 started darolutamide 600 mg twice daily # definitive treatment for prostate and pelvic node- Discussed with [facility] [name] and I appreciate his expert opinion of darolutamide concurrent use with RT and ADT   INTERVAL HISTORY Matthew Mcgrath is a 75 y.o. male who has above history reviewed by me today presents for follow up visit for management of metastatic prostate cancer Problems and complaints are listed below: Patient was accompanied by her sister. on darolutamide 600 twice daily-[facility].  He is currently on definitive treatment for prostate and pelvic node    Patient reports no new complaints-poor historian Reports feeling fatigued.  Otherwise no new complaints.  Review of Systems  Constitutional:  Positive for fatigue. Negative for appetite change, chills, fever and unexpected weight change.  HENT:   Negative for hearing loss and voice change.   Eyes:  Negative for eye problems and icterus.  Respiratory:  Negative for chest tightness, cough and shortness of breath.   Cardiovascular:  Negative for chest pain and leg swelling.  Gastrointestinal:  Negative for abdominal distention and abdominal pain.  Endocrine: Negative for hot flashes.  Genitourinary:  Negative for difficulty urinating, dysuria and frequency.   Musculoskeletal:  Positive for back pain. Negative for arthralgias.  Skin:  Negative for itching and rash.  Neurological:  Negative for light-headedness and numbness.  Hematological:  Negative for adenopathy. Does  not bruise/bleed easily.  Psychiatric/Behavioral:  Negative for confusion.     MEDICAL HISTORY:  Past Medical History:  Diagnosis Date   Cardiomyopathy ([place])    Congestive heart failure (HCC)    Diabetes mellitus type 2, controlled ([place])    Elevated lipids    HTN (hypertension)     SURGICAL HISTORY: Past Surgical History:  Procedure Laterality Date   CARDIAC CATHETERIZATION N/A 04/14/2015   Procedure: Left Heart Cath and Coronary Angiography;  Surgeon: [name], MD;  Location: [facility];  Service: Cardiovascular;  Laterality: N/A;   CATARACT EXTRACTION W/ INTRAOCULAR LENS  IMPLANT, BILATERAL Left    COLONOSCOPY WITH PROPOFOL N/A 04/08/2020   Procedure: COLONOSCOPY WITH PROPOFOL;  Surgeon: Toledo, Benay Pike, MD;  Location: [facility] ENDOSCOPY;  Service: Gastroenterology;  Laterality: N/A;   HERNIA REPAIR     IMPLANTABLE CARDIOVERTER DEFIBRILLATOR (ICD) GENERATOR CHANGE Left 07/03/2015   Procedure: ICD IMPLANT single chamber;  Surgeon: Marzetta Board, MD;  Location: [facility] ORS;  Service: Cardiovascular;  Laterality: Left;    SOCIAL HISTORY: Social History   Socioeconomic History   Marital status: Single    Spouse name: Not on file   Number of children: Not on file   Years of education: Not on file   Highest education level: Not on file  Occupational History   Not on file  Tobacco Use   Smoking status: Former    Packs/day: 0.25    Years: 30.00    Pack years: 7.50    Types: Cigarettes    Quit date: 02/11/2020    Years since quitting: 0.6   Smokeless tobacco: Never  Vaping Use   Vaping Use: Never used  Substance and Sexual Activity   Alcohol use: No   Drug use: Not Currently    Frequency: 2.0 times per week    Types: Marijuana    Comment: past use   Sexual activity: Not on file  Other Topics Concern   Not on file  Social History Narrative   Not on file   Social Determinants of Health   Financial Resource Strain: Not on file  Food Insecurity: Not on file  Transportation Needs: Not on file  Physical Activity: Not on file   Stress: Not on file  Social Connections: Not on file  Intimate Partner Violence: Not on file    FAMILY HISTORY: Family History  Problem Relation Age of Onset   Heart failure Mother    Heart disease Mother    Alcoholism Father    Hypertension Sister    Diabetes Sister    Hypertension Brother    Diabetes Brother    Colon cancer Brother    Multiple myeloma Brother     ALLERGIES:  has No Known Allergies.  MEDICATIONS:  Current Outpatient Medications  Medication Sig Dispense Refill   acetaminophen (TYLENOL) 650 MG CR tablet Take 650 mg by mouth every 8 (eight) hours as needed for pain.     aspirin EC 81 MG tablet @_0  1 TABLET BY MOUTH ONCE DAILY.     carvedilol (COREG) 25 MG tablet Take 25 mg by mouth 2 (two) times daily with a meal.     cholecalciferol (VITAMIN D3) 25 MCG (1000 UNIT) tablet Take 1,000 Units by mouth daily.     darolutamide (NUBEQA) 300 MG tablet Take 300 mg by mouth 2 (two) times daily with a meal.     Docosahexaenoic Acid (DHA OMEGA 3 PO) 1,000 mg.     furosemide (LASIX) 40 MG tablet  Take 40 mg by mouth daily.      glipiZIDE (GLUCOTROL) 5 MG tablet Take by mouth daily before breakfast.     glucose blood (TRUE METRIX BLOOD GLUCOSE TEST) test strip 1 each by Other route as needed for other. Use as instructed     LIDODERM 5 % Apply 1 patch to skin every morning as needed for L lower back pain. Remove after 12 hours. Call for refills     lisinopril (ZESTRIL) 10 MG tablet Take 10 mg by mouth daily.      magnesium oxide (MAG-OX) 400 MG tablet Take 400 mg by mouth 2 (two) times daily.     metFORMIN (GLUCOPHAGE) 500 MG tablet Take by mouth 2 (two) times daily with a meal.     MIRALAX 17 GM/SCOOP powder Take 17 g by mouth daily.     Multiple Vitamin (MULTIVITAMIN ADULT PO) SMARTSIG:1 Tablet(s) By Mouth Daily     Omega-3 Fatty Acids (FISH OIL PO) Take by mouth.     potassium chloride (KLOR-CON) 10 MEQ tablet Take 10 mEq by mouth daily.     simvastatin (ZOCOR) 40 MG  tablet Take 40 mg by mouth daily at 6 PM.      spironolactone (ALDACTONE) 25 MG tablet Take 25 mg by mouth daily.     TRUEplus Lancets 28G MISC by Does not apply route.     No current facility-administered medications for this visit.     PHYSICAL EXAMINATION: ECOG PERFORMANCE STATUS: 1 - Symptomatic but completely ambulatory Vitals:   10/09/20 1154  BP: 96/67  Pulse: 73  Resp: 18  Temp: 97.8 F (36.6 C)   Filed Weights   10/09/20 1154  Weight: 190 lb 3.2 oz (86.3 kg)    Physical Exam Constitutional:      General: He is not in acute distress. HENT:     Head: Normocephalic and atraumatic.  Eyes:     General: No scleral icterus. Cardiovascular:     Rate and Rhythm: Normal rate and regular rhythm.     Heart sounds: Normal heart sounds.  Pulmonary:     Effort: Pulmonary effort is normal. No respiratory distress.     Breath sounds: No wheezing.  Abdominal:     General: Bowel sounds are normal. There is no distension.     Palpations: Abdomen is soft.  Musculoskeletal:        General: No deformity. Normal range of motion.     Cervical back: Normal range of motion and neck supple.  Skin:    General: Skin is warm and dry.     Findings: No erythema or rash.  Neurological:     Mental Status: He is alert and oriented to person, place, and time. Mental status is at baseline.     Cranial Nerves: No cranial nerve deficit.     Coordination: Coordination normal.  Psychiatric:        Mood and Affect: Mood normal.    LABORATORY DATA:  I have reviewed the data as listed Lab Results  Component Value Date   WBC 3.3 (L) 10/07/2020   HGB 9.6 (L) 10/07/2020   HCT 30.2 (L) 10/07/2020   MCV 99.0 10/07/2020   PLT 146 (L) 10/07/2020   Recent Labs    07/06/20 1114 08/07/20 1213 10/07/20 1055  NA 136 139 132*  K 4.5 4.3 4.5  CL 97* 104 99  CO2 _0 GLUCOSE 217* 241* 229*  BUN 29* 30* 36*  CREATININE 0.90 0.84 1.05  CALCIUM 9.3 9.5 9.2  GFRNONAA >60 >60 >60  PROT 7.6  7.6 7.4  ALBUMIN 4.0 4.0 3.9  AST _0 ALT _1 ALKPHOS 67 72 61  BILITOT 0.8 0.6 0.3    Iron/TIBC/Ferritin/ %Sat No results found for: IRON, TIBC, FERRITIN, IRONPCTSAT   Tests done at [facility] 06/10/20  Tests: (1) Comp. Metabolic Panel (14) (737106)    Glucose              [H]  220 mg/dL                   65-99    BUN                       18 mg/dL                    8-27    Creatinine                0.84 mg/dL                  0.76-1.27    eGFR                      93 mL/min/1.73              >59    BUN/Creatinine Ratio      21                          10-24    Sodium                    136 mmol/L                  134-144    Potassium                 4.9 mmol/L                  3.5-5.2    Chloride                  98 mmol/L                   96-106   Carbon Dioxide, Total                         [L]  19 mmol/L                   20-29    Calcium                   9.8 mg/dL                   8.6-10.2    Protein, Total            7.0 g/dL                    6.0-8.5    Albumin                   4.4 g/dL                    3.7-4.7    Globulin, Total           2.6 g/dL  1.5-4.5    A/G Ratio                 1.7                         1.2-2.2    Bilirubin, Total          0.4 mg/dL                   0.0-1.2    Alkaline Phosphatase      65 IU/L                     44-121    AST (SGOT)                15 IU/L                     0-40    ALT (SGPT)                13 IU/L                     0-44   Tests: (1) CBC With Differential/Platelet (005009)    WBC                       4.9 x10E3/uL                3.4-10.8    RBC                  [L]  4.12 x10E6/uL               4.14-5.80    Hemoglobin           [L]  12.5 g/dL                   13.0-17.7    Hematocrit                38.7 %                      37.5-51.0    MCV                       94 fL                       79-97    MCH                       30.3 pg                     26.6-33.0    MCHC                       32.3 g/dL                   31.5-35.7    RDW                       11.6 %                      11.6-15.4    Platelets                 200 x10E3/uL  150-450    Neutrophils               55 %                        Not Estab.    Lymphs                    32 %                        Not Estab.    Monocytes                 9 %                         Not Estab.    Eos                       3 %                         Not Estab.    Basos                     1 %                         Not Estab.   Neutrophils (Absolute)                              2.8 x10E3/uL                1.4-7.0    Lymphs (Absolute)         1.6 x10E3/uL                0.7-3.1    Monocytes(Absolute)       0.4 x10E3/uL                0.1-0.9    Eos (Absolute)            0.1 x10E3/uL                0.0-0.4    Baso (Absolute)           0.0 x10E3/uL                0.0-0.2   Immature Granulocytes                              0 %                         Not Estab.    Immature Grans (Abs)      0.0 x10E3/uL                0.0-0.1    PSA 4.8   RADIOGRAPHIC STUDIES: I have personally reviewed the radiological images as listed and agreed with the findings in the report. No results found.    ASSESSMENT & PLAN:  1. Prostate cancer ([place])   2. Metastatic cancer to bone ([place])   3. Encounter for antineoplastic chemotherapy   4. Normocytic anemia   Cancer Staging Prostate cancer [facility]) Staging form: Prostate, AJCC 8th Edition - Clinical stage from 05/11/2020: Stage IVB (cTX, cN0, cM1,  PSA: 30) - Signed by [name], MD on 05/11/2020   #Metastatic prostate cancer to bone. Check CBC CMP PSA. -Labs are reviewed.  PSA has decreased to 0.02 Eligard 45 mg Q6 months [facility]-06/11/2020 Continue darolutamide.  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 TMB 3.1 mut/Mb, MS stable, PD-L1 1%  Consider possible rucaparib/olaparib in subsequent lines of treatment I recommend genetic  testing. #Normocytic anemia, hemoglobin has decreased to 9.6.  This is secondary to radiation.  Monitor closely.  #Follow-up with radiation oncology to finish Discussed definitive prostate cancer and pelvic lymph node treatments.  Supportive care measures are necessary for patient well-being and will be provided as necessary. We spent sufficient time to discuss many aspect of care, questions were answered to patient's satisfaction.   All questions were answered. The patient knows to call the clinic with any problems questions or concerns. cc  Inc, [place]   Return of visit: 6 weeks    ZEarlie Server MD, PhD Hematology Oncology [facility]at APhys[facility] Of Winter [facility] LLCPager- [phone]/07/2020

## 2020-10-09 NOTE — Progress Notes (Signed)
Pt here for follow up. No new concerns voiced.   

## 2020-10-12 ENCOUNTER — Ambulatory Visit
Admission: RE | Admit: 2020-10-12 | Discharge: 2020-10-12 | Disposition: A | Discharge status: Home / Self Care | Payer: Medicare (Managed Care) | Source: Ambulatory Visit | Attending: Radiation Oncology | Admitting: Radiation Oncology

## 2020-10-12 DIAGNOSIS — Z510 Encounter for antineoplastic radiation therapy: Secondary | ICD-10-CM | POA: Diagnosis not present

## 2020-10-14 ENCOUNTER — Ambulatory Visit
Admission: RE | Admit: 2020-10-14 | Discharge: 2020-10-14 | Disposition: A | Discharge status: Home / Self Care | Payer: Medicare (Managed Care) | Source: Ambulatory Visit | Attending: Radiation Oncology | Admitting: Radiation Oncology

## 2020-10-14 DIAGNOSIS — Z510 Encounter for antineoplastic radiation therapy: Secondary | ICD-10-CM | POA: Diagnosis not present

## 2020-10-19 ENCOUNTER — Ambulatory Visit
Admission: RE | Admit: 2020-10-19 | Discharge: 2020-10-19 | Disposition: A | Discharge status: Home / Self Care | Payer: Medicare (Managed Care) | Source: Ambulatory Visit | Attending: Radiation Oncology | Admitting: Radiation Oncology

## 2020-10-19 DIAGNOSIS — Z510 Encounter for antineoplastic radiation therapy: Secondary | ICD-10-CM | POA: Diagnosis not present

## 2020-10-20 ENCOUNTER — Ambulatory Visit (INDEPENDENT_AMBULATORY_CARE_PROVIDER_SITE_OTHER): Payer: Medicare Other | Admitting: Vascular Surgery

## 2020-10-20 ENCOUNTER — Encounter (INDEPENDENT_AMBULATORY_CARE_PROVIDER_SITE_OTHER): Payer: Medicare Other

## 2020-11-17 ENCOUNTER — Other Ambulatory Visit (INDEPENDENT_AMBULATORY_CARE_PROVIDER_SITE_OTHER): Payer: Self-pay | Admitting: Vascular Surgery

## 2020-11-17 DIAGNOSIS — I723 Aneurysm of iliac artery: Secondary | ICD-10-CM

## 2020-11-19 ENCOUNTER — Ambulatory Visit: Payer: Medicare (Managed Care) | Admitting: Radiation Oncology

## 2020-11-19 ENCOUNTER — Inpatient Hospital Stay: Payer: Medicare (Managed Care)

## 2020-11-20 ENCOUNTER — Encounter (INDEPENDENT_AMBULATORY_CARE_PROVIDER_SITE_OTHER): Payer: Medicare (Managed Care)

## 2020-11-20 ENCOUNTER — Ambulatory Visit (INDEPENDENT_AMBULATORY_CARE_PROVIDER_SITE_OTHER): Payer: Self-pay | Admitting: Vascular Surgery

## 2020-11-20 ENCOUNTER — Other Ambulatory Visit: Payer: Medicare (Managed Care)

## 2020-11-23 ENCOUNTER — Encounter: Payer: Self-pay | Admitting: Radiation Oncology

## 2020-11-23 ENCOUNTER — Inpatient Hospital Stay: Payer: Medicare (Managed Care)

## 2020-11-23 ENCOUNTER — Inpatient Hospital Stay: Payer: Medicare (Managed Care) | Attending: Oncology | Admitting: Oncology

## 2020-11-23 ENCOUNTER — Ambulatory Visit
Admission: RE | Admit: 2020-11-23 | Discharge: 2020-11-23 | Disposition: A | Discharge status: Home / Self Care | Payer: Medicare (Managed Care) | Source: Ambulatory Visit | Attending: Radiation Oncology | Admitting: Radiation Oncology

## 2020-11-23 ENCOUNTER — Encounter: Payer: Self-pay | Admitting: Oncology

## 2020-11-23 ENCOUNTER — Other Ambulatory Visit: Payer: Self-pay

## 2020-11-23 VITALS — BP 133/87 | HR 81 | Temp 97.5°F | Resp 18 | Wt 191.9 lb

## 2020-11-23 VITALS — BP 152/93 | HR 78 | Resp 16 | Wt 192.0 lb

## 2020-11-23 DIAGNOSIS — D649 Anemia, unspecified: Secondary | ICD-10-CM

## 2020-11-23 DIAGNOSIS — C61 Malignant neoplasm of prostate: Secondary | ICD-10-CM

## 2020-11-23 DIAGNOSIS — Z808 Family history of malignant neoplasm of other organs or systems: Secondary | ICD-10-CM | POA: Insufficient documentation

## 2020-11-23 DIAGNOSIS — Z923 Personal history of irradiation: Secondary | ICD-10-CM | POA: Diagnosis not present

## 2020-11-23 DIAGNOSIS — M545 Low back pain, unspecified: Secondary | ICD-10-CM | POA: Insufficient documentation

## 2020-11-23 DIAGNOSIS — Z87891 Personal history of nicotine dependence: Secondary | ICD-10-CM | POA: Insufficient documentation

## 2020-11-23 DIAGNOSIS — R351 Nocturia: Secondary | ICD-10-CM | POA: Diagnosis not present

## 2020-11-23 DIAGNOSIS — E1165 Type 2 diabetes mellitus with hyperglycemia: Secondary | ICD-10-CM | POA: Diagnosis not present

## 2020-11-23 DIAGNOSIS — I110 Hypertensive heart disease with heart failure: Secondary | ICD-10-CM | POA: Insufficient documentation

## 2020-11-23 DIAGNOSIS — C7951 Secondary malignant neoplasm of bone: Secondary | ICD-10-CM | POA: Diagnosis not present

## 2020-11-23 DIAGNOSIS — I509 Heart failure, unspecified: Secondary | ICD-10-CM | POA: Diagnosis not present

## 2020-11-23 DIAGNOSIS — Z5111 Encounter for antineoplastic chemotherapy: Secondary | ICD-10-CM

## 2020-11-23 LAB — COMPREHENSIVE METABOLIC PANEL
ALT: 14 U/L (ref 0–44)
AST: 15 U/L (ref 15–41)
Albumin: 3.7 g/dL (ref 3.5–5.0)
Alkaline Phosphatase: 75 U/L (ref 38–126)
Anion gap: 8 (ref 5–15)
BUN: 24 mg/dL — ABNORMAL HIGH (ref 8–23)
CO2: 23 mmol/L (ref 22–32)
Calcium: 9.1 mg/dL (ref 8.9–10.3)
Chloride: 105 mmol/L (ref 98–111)
Creatinine, Ser: 0.87 mg/dL (ref 0.61–1.24)
GFR, Estimated: >60 mL/min (ref >60)
Glucose, Bld: 243 mg/dL — ABNORMAL HIGH (ref 70–99)
Potassium: 4.4 mmol/L (ref 3.5–5.1)
Sodium: 136 mmol/L (ref 135–145)
Total Bilirubin: 0.5 mg/dL (ref 0.3–1.2)
Total Protein: 7.3 g/dL (ref 6.5–8.1)

## 2020-11-23 LAB — IRON AND TIBC
Iron: 82 ug/dL (ref 45–182)
Saturation Ratios: 25 % (ref 17.9–39.5)
TIBC: 335 ug/dL (ref 250–450)
UIBC: 253 ug/dL

## 2020-11-23 LAB — CBC WITH DIFFERENTIAL/PLATELET
Abs Immature Granulocytes: 0.01 10*3/uL (ref 0.00–0.07)
Basophils Absolute: 0 10*3/uL (ref 0.0–0.1)
Basophils Relative: 0 %
Eosinophils Absolute: 0.1 10*3/uL (ref 0.0–0.5)
Eosinophils Relative: 2 %
HCT: 30.8 % — ABNORMAL LOW (ref 39.0–52.0)
Hemoglobin: 9.4 g/dL — ABNORMAL LOW (ref 13.0–17.0)
Immature Granulocytes: 0 %
Lymphocytes Relative: 12 %
Lymphs Abs: 0.4 10*3/uL — ABNORMAL LOW (ref 0.7–4.0)
MCH: 30.6 pg (ref 26.0–34.0)
MCHC: 30.5 g/dL (ref 30.0–36.0)
MCV: 100.3 fL — ABNORMAL HIGH (ref 80.0–100.0)
Monocytes Absolute: 0.5 10*3/uL (ref 0.1–1.0)
Monocytes Relative: 14 %
Neutro Abs: 2.4 10*3/uL (ref 1.7–7.7)
Neutrophils Relative %: 72 %
Platelets: 207 10*3/uL (ref 150–400)
RBC: 3.07 MIL/uL — ABNORMAL LOW (ref 4.22–5.81)
RDW: 14.6 % (ref 11.5–15.5)
WBC: 3.3 10*3/uL — ABNORMAL LOW (ref 4.0–10.5)
nRBC: 0 % (ref 0.0–0.2)

## 2020-11-23 LAB — FERRITIN: Ferritin: 221 ng/mL (ref 24–336)

## 2020-11-23 LAB — PSA: Prostatic Specific Antigen: 0.02 ng/mL (ref 0.00–4.00)

## 2020-11-23 NOTE — Progress Notes (Signed)
Hematology/Oncology progress note [facility] Telephone:([phone] [phone]   Patient Care Team: Inc, [place] as PCP - General [name], MD as Consulting Physician (Cardiology) Alisa Graff, FNP as Nurse Practitioner (Family Medicine)  REFERRING PROVIDER: [facility], [place] [facility]  CHIEF COMPLAINTS/REASON FOR VISIT:  metastatic prostate cancer  HISTORY OF PRESENTING ILLNESS:   Matthew Mcgrath is a  75 y.o.  male with PMH listed below was seen in consultation at the request of  Inc, [place]  for evaluation of metastatic prostate cancer. Patient was accompanied by her sister who he currently lives with.  History was mainly obtained from sister as well as reviewing medical records.  Patient is a poor historian. Per sister, she has noticed patient having increased urgency and frequency of urination.  Patient was evaluated by urologist Dr. Diamantina Providence in January 2022.  PSA was elevated at 30.4 in January 2022 04/01/2020, prostate biopsy showed  All cores were positive for high risk prostate cancer, primarily Gleason score 5+4 = 9, with max core involvement of 100% and perineural invasion present. 04/21/2020, CT abdomen pelvis showed a sclerotic lesion in the lumbar spine concerning for probable metastatic disease.  No definitive extra skeletal metastatic disease noted elsewhere in the abdomen or pelvis.  Mild circumferential bladder wall thickening.  Aortic atherosclerosis. 04/27/2020, bone scan showed foci of abnormal uptake noted in the mid thoracic and upper lumbar spine concerning for metastatic disease.  Patient reports some lower back pain intermittently.  He currently lives with his sister.   Past medical history includes CHF, hypertension, hyper lipidemia, diabetes type 2 Per sister, family history is positive for colon cancer and another brother, multiple myeloma in the other brother.  04/16/2020 Firmagon 240 mg  loading dose #06/11/2020 patient went to [facility] for second opinion was seen by [name] who agrees with continuing ADT with switch to Eligard 45 mg.  He is due to proceed with the treatments today.  Communicated with pace program [name], patient will receive treatment at [place]. [name] also recommends to start darolutamide and authorization process has been initiated at [facility]. #07/06/2020 started darolutamide 600 mg twice daily # definitive treatment for prostate and pelvic node- Discussed with [facility] [name] and I appreciate his expert opinion of darolutamide concurrent use with RT and ADT # 10/19/2020 finished definitive RT. To prostate cancer and pelvic lymph node treatments.  INTERVAL HISTORY Matthew Mcgrath is a 75 y.o. male who has above history reviewed by me today presents for follow up visit for management of metastatic prostate cancer Problems and complaints are listed below: Patient was accompanied by her sister. on darolutamide 600 twice daily-through [facility] program.   Finished definitive treatment for prostate and pelvic node    Patient reports no new complaints-poor historian He was accompanied by his sister.    Review of Systems  Constitutional:  Positive for fatigue. Negative for appetite change, chills, fever and unexpected weight change.  HENT:   Negative for hearing loss and voice change.   Eyes:  Negative for eye problems and icterus.  Respiratory:  Negative for chest tightness, cough and shortness of breath.   Cardiovascular:  Negative for chest pain and leg swelling.  Gastrointestinal:  Negative for abdominal distention and abdominal pain.  Endocrine: Negative for hot flashes.  Genitourinary:  Negative for difficulty urinating, dysuria and frequency.   Musculoskeletal:  Positive for back pain. Negative for arthralgias.  Skin:  Negative for itching and rash.  Neurological:  Negative for  light-headedness and numbness.  Hematological:  Negative for adenopathy. Does not bruise/bleed  easily.  Psychiatric/Behavioral:  Negative for confusion.    MEDICAL HISTORY:  Past Medical History:  Diagnosis Date   Cardiomyopathy ([place])    Congestive heart failure (HCC)    Diabetes mellitus type 2, controlled ([place])    Elevated lipids    HTN (hypertension)     SURGICAL HISTORY: Past Surgical History:  Procedure Laterality Date   CARDIAC CATHETERIZATION N/A 04/14/2015   Procedure: Left Heart Cath and Coronary Angiography;  Surgeon: [name], MD;  Location: [place] [facility];  Service: Cardiovascular;  Laterality: N/A;   CATARACT EXTRACTION W/ INTRAOCULAR LENS  IMPLANT, BILATERAL Left    COLONOSCOPY WITH PROPOFOL N/A 04/08/2020   Procedure: COLONOSCOPY WITH PROPOFOL;  Surgeon: Toledo, Benay Pike, MD;  Location: [facility] ENDOSCOPY;  Service: Gastroenterology;  Laterality: N/A;   HERNIA REPAIR     IMPLANTABLE CARDIOVERTER DEFIBRILLATOR (ICD) GENERATOR CHANGE Left 07/03/2015   Procedure: ICD IMPLANT single chamber;  Surgeon: Marzetta Board, MD;  Location: [facility] ORS;  Service: Cardiovascular;  Laterality: Left;    SOCIAL HISTORY: Social History   Socioeconomic History   Marital status: Single    Spouse name: Not on file   Number of children: Not on file   Years of education: Not on file   Highest education level: Not on file  Occupational History   Not on file  Tobacco Use   Smoking status: Former    Packs/day: 0.25    Years: 30.00    Pack years: 7.50    Types: Cigarettes    Quit date: 02/11/2020    Years since quitting: 0.7   Smokeless tobacco: Never  Vaping Use   Vaping Use: Never used  Substance and Sexual Activity   Alcohol use: No   Drug use: Not Currently    Frequency: 2.0 times per week    Types: Marijuana    Comment: past use   Sexual activity: Not on file  Other Topics Concern   Not on file  Social History Narrative   Not on file   Social Determinants of Health   Financial Resource Strain: Not on file  Food Insecurity: Not on file   Transportation Needs: Not on file  Physical Activity: Not on file  Stress: Not on file  Social Connections: Not on file  Intimate Partner Violence: Not on file    FAMILY HISTORY: Family History  Problem Relation Age of Onset   Heart failure Mother    Heart disease Mother    Alcoholism Father    Hypertension Sister    Diabetes Sister    Hypertension Brother    Diabetes Brother    Colon cancer Brother    Multiple myeloma Brother     ALLERGIES:  has No Known Allergies.  MEDICATIONS:  Current Outpatient Medications  Medication Sig Dispense Refill   acetaminophen (TYLENOL) 650 MG CR tablet Take 650 mg by mouth every 8 (eight) hours as needed for pain.     aspirin EC 81 MG tablet @@TAKE  1 TABLET BY MOUTH ONCE DAILY.     cholecalciferol (VITAMIN D3) 25 MCG (1000 UNIT) tablet Take 1,000 Units by mouth daily.     darolutamide (NUBEQA) 300 MG tablet Take 300 mg by mouth 2 (two) times daily with a meal.     Docosahexaenoic Acid (DHA OMEGA 3 PO) 1,000 mg.     glipiZIDE (GLUCOTROL) 5 MG tablet Take by mouth daily before breakfast.     glucose  blood (TRUE METRIX BLOOD GLUCOSE TEST) test strip 1 each by Other route as needed for other. Use as instructed     LIDODERM 5 % Apply 1 patch to skin every morning as needed for L lower back pain. Remove after 12 hours. Call for refills     magnesium oxide (MAG-OX) 400 MG tablet Take 400 mg by mouth 2 (two) times daily.     metFORMIN (GLUCOPHAGE) 500 MG tablet Take by mouth 2 (two) times daily with a meal.     MIRALAX 17 GM/SCOOP powder Take 17 g by mouth daily.     Multiple Vitamin (MULTIVITAMIN ADULT PO) SMARTSIG:1 Tablet(s) By Mouth Daily     Omega-3 Fatty Acids (FISH OIL PO) Take by mouth.     potassium chloride (KLOR-CON) 10 MEQ tablet Take 10 mEq by mouth daily.     simvastatin (ZOCOR) 40 MG tablet Take 40 mg by mouth daily at 6 PM.      spironolactone (ALDACTONE) 25 MG tablet Take 25 mg by mouth daily.     TRUEplus Lancets 28G MISC by Does  not apply route.     carvedilol (COREG) 6.25 MG tablet Take 6.25 mg by mouth 2 (two) times daily. (Patient not taking: No sig reported)     furosemide (LASIX) 40 MG tablet Take 40 mg by mouth daily.  (Patient not taking: No sig reported)     lisinopril (ZESTRIL) 10 MG tablet Take 10 mg by mouth daily.  (Patient not taking: No sig reported)     No current facility-administered medications for this visit.     PHYSICAL EXAMINATION: ECOG PERFORMANCE STATUS: 1 - Symptomatic but completely ambulatory Vitals:   11/23/20 1139  BP: 133/87  Pulse: 81  Resp: 18  Temp: (!) 97.5 F (36.4 C)   Filed Weights   11/23/20 1139  Weight: 191 lb 14.4 oz (87 kg)    Physical Exam Constitutional:      General: He is not in acute distress. HENT:     Head: Normocephalic and atraumatic.  Eyes:     General: No scleral icterus. Cardiovascular:     Rate and Rhythm: Normal rate and regular rhythm.     Heart sounds: Normal heart sounds.  Pulmonary:     Effort: Pulmonary effort is normal. No respiratory distress.     Breath sounds: No wheezing.  Abdominal:     General: Bowel sounds are normal. There is no distension.     Palpations: Abdomen is soft.  Musculoskeletal:        General: No deformity. Normal range of motion.     Cervical back: Normal range of motion and neck supple.  Skin:    General: Skin is warm and dry.     Findings: No erythema or rash.  Neurological:     Mental Status: He is alert and oriented to person, place, and time. Mental status is at baseline.     Cranial Nerves: No cranial nerve deficit.     Coordination: Coordination normal.  Psychiatric:        Mood and Affect: Mood normal.    LABORATORY DATA:  I have reviewed the data as listed Lab Results  Component Value Date   WBC 3.3 (L) 11/23/2020   HGB 9.4 (L) 11/23/2020   HCT 30.8 (L) 11/23/2020   MCV 100.3 (H) 11/23/2020   PLT 207 11/23/2020   Recent Labs    08/07/20 1213 10/07/20 1055 11/23/20 0953  NA 139 132*  136  K 4.3 4.5  4.4  CL 104 99 105  CO2 23 24 23   GLUCOSE 241* 229* 243*  BUN 30* 36* 24*  CREATININE 0.84 1.05 0.87  CALCIUM 9.5 9.2 9.1  GFRNONAA >60 >60 >60  PROT 7.6 7.4 7.3  ALBUMIN 4.0 3.9 3.7  AST 19 19 15   ALT 13 13 14   ALKPHOS 72 61 75  BILITOT 0.6 0.3 0.5    Iron/TIBC/Ferritin/ %Sat    Component Value Date/Time   IRON 82 11/23/2020 0953   TIBC 335 11/23/2020 0953   FERRITIN 221 11/23/2020 0953   IRONPCTSAT 25 11/23/2020 0953     Tests done at [facility] 06/10/20  Tests: (1) Comp. Metabolic Panel (14) (578469)    Glucose              [H]  220 mg/dL                   65-99    BUN                       18 mg/dL                    8-27    Creatinine                0.84 mg/dL                  0.76-1.27    eGFR                      93 mL/min/1.73              >59    BUN/Creatinine Ratio      21                          10-24    Sodium                    136 mmol/L                  134-144    Potassium                 4.9 mmol/L                  3.5-5.2    Chloride                  98 mmol/L                   96-106   Carbon Dioxide, Total                         [L]  19 mmol/L                   20-29    Calcium                   9.8 mg/dL                   8.6-10.2    Protein, Total            7.0 g/dL                    6.0-8.5    Albumin  4.4 g/dL                    3.7-4.7    Globulin, Total           2.6 g/dL                    1.5-4.5    A/G Ratio                 1.7                         1.2-2.2    Bilirubin, Total          0.4 mg/dL                   0.0-1.2    Alkaline Phosphatase      65 IU/L                     44-121    AST (SGOT)                15 IU/L                     0-40    ALT (SGPT)                13 IU/L                     0-44   Tests: (1) CBC With Differential/Platelet (005009)    WBC                       4.9 x10E3/uL                3.4-10.8    RBC                  [L]  4.12 x10E6/uL               4.14-5.80    Hemoglobin            [L]  12.5 g/dL                   13.0-17.7    Hematocrit                38.7 %                      37.5-51.0    MCV                       94 fL                       79-97    MCH                       30.3 pg                     26.6-33.0    MCHC                      32.3 g/dL                   31.5-35.7    RDW  11.6 %                      11.6-15.4    Platelets                 200 x10E3/uL                150-450    Neutrophils               55 %                        Not Estab.    Lymphs                    32 %                        Not Estab.    Monocytes                 9 %                         Not Estab.    Eos                       3 %                         Not Estab.    Basos                     1 %                         Not Estab.   Neutrophils (Absolute)                              2.8 x10E3/uL                1.4-7.0    Lymphs (Absolute)         1.6 x10E3/uL                0.7-3.1    Monocytes(Absolute)       0.4 x10E3/uL                0.1-0.9    Eos (Absolute)            0.1 x10E3/uL                0.0-0.4    Baso (Absolute)           0.0 x10E3/uL                0.0-0.2   Immature Granulocytes                              0 %                         Not Estab.    Immature Grans (Abs)      0.0 x10E3/uL                0.0-0.1    PSA 4.8   RADIOGRAPHIC STUDIES: I have personally reviewed the radiological images as listed and agreed with the  findings in the report. No results found.    ASSESSMENT & PLAN:  1. Prostate cancer ([place])   2. Metastatic cancer to bone ([place])   3. Encounter for antineoplastic chemotherapy   4. Normocytic anemia   Cancer Staging Prostate cancer [facility]) Staging form: Prostate, AJCC 8th Edition - Clinical stage from 05/11/2020: Stage IVB (cTX, cN0, cM1, PSA: 30) - Signed by [name], MD on 05/11/2020   #Metastatic prostate cancer to bone. Check CBC CMP PSA. -Labs are reviewed.  PSA has decreased to 10/07/2020  0.02 Eligard 45 mg Q6 months with his PCP at [facility]- due around 12/11/2020 Labs are reviewed and discussed with patient. Continue darolutamide.  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 TMB 3.1 mut/Mb, MS stable, PD-L1 1%  Consider possible rucaparib/olaparib in subsequent lines of treatment I recommend genetic testing. Refer to gDietitian.  #Normocytic anemia, hemoglobin has decreased to 9.4.  This is secondary to radiation.  Monitor.  Iron labs are added. Normal.  # Hyperglycemia due to DM. Recommend patient to closely follow up with pcp.   Supportive care measures are necessary for patient well-being and will be provided as necessary. We spent sufficient time to discuss many aspect of care, questions were answered to patient's satisfaction.   All questions were answered. The patient knows to call the clinic with any problems questions or concerns. cc  Inc, [place]   Return of visit: 6 weeks    ZEarlie Server MD, PhD 11/23/2020

## 2020-11-23 NOTE — Progress Notes (Signed)
Radiation Oncology Follow up Note  Name: Matthew Mcgrath   Date:   11/23/2020 MRN:  [id number] DOB: 1945/05/24    This 75 y.o. male presents to the clinic today for 1 month follow-up status post radiation therapy to both prostate and areas of metastatic involvement of thoracic spine for stage IV high-grade adenocarcinoma the prostate Gleason 9 (5+4).  REFERRING PROVIDER: [facility], [facility]  HPI: Patient is a 75 year old male now out 1 month having completed both prostate pelvic node and SBRT to his spine for oligometastatic disease patient with Gleason 9 adenocarcinoma the prostate.  Seen today in routine follow-up he is doing well still complains of some back pain.  Has been having no significant lower urinary tract symptoms no diarrhea or fatigue.  He has nocturia x2.Marland Kitchen  COMPLICATIONS OF TREATMENT: none  FOLLOW UP COMPLIANCE: keeps appointments   PHYSICAL EXAM:  BP (!) 152/93   Pulse 78   Resp 16   Wt 192 lb (87.1 kg)   BMI 31.95 kg/m  Well-developed well-nourished patient in NAD. HEENT reveals PERLA, EOMI, discs not visualized.  Oral cavity is clear. No oral mucosal lesions are identified. Neck is clear without evidence of cervical or supraclavicular adenopathy. Lungs are clear to A&P. Cardiac examination is essentially unremarkable with regular rate and rhythm without murmur rub or thrill. Abdomen is benign with no organomegaly or masses noted. Motor sensory and DTR levels are equal and symmetric in the upper and lower extremities. Cranial nerves II through XII are grossly intact. Proprioception is intact. No peripheral adenopathy or edema is identified. No motor or sensory levels are noted. Crude visual fields are within normal range.  RADIOLOGY RESULTS: No current films to review  PLAN: At the present time patient is doing well very low side effect profile I am pleased with his overall progress.  I have asked to see him back in 3 to 4 months with a PSA at that time.  He continues  on ADT therapy.  Patient and family know to call with any concerns.  I would like to take this opportunity to thank you for allowing me to participate in the care of your patient.Noreene Filbert, MD

## 2020-11-23 NOTE — Progress Notes (Signed)
Pt here for follow up. No new concerns voiced.   

## 2020-11-26 ENCOUNTER — Ambulatory Visit (INDEPENDENT_AMBULATORY_CARE_PROVIDER_SITE_OTHER): Payer: Medicare (Managed Care) | Admitting: Nurse Practitioner

## 2020-11-26 ENCOUNTER — Other Ambulatory Visit: Payer: Self-pay

## 2020-11-26 ENCOUNTER — Encounter (INDEPENDENT_AMBULATORY_CARE_PROVIDER_SITE_OTHER): Payer: Self-pay | Admitting: Nurse Practitioner

## 2020-11-26 ENCOUNTER — Ambulatory Visit (INDEPENDENT_AMBULATORY_CARE_PROVIDER_SITE_OTHER): Payer: Medicare (Managed Care)

## 2020-11-26 VITALS — BP 145/87 | HR 85 | Ht 68.0 in | Wt 181.0 lb

## 2020-11-26 DIAGNOSIS — I723 Aneurysm of iliac artery: Secondary | ICD-10-CM

## 2020-11-26 DIAGNOSIS — I10 Essential (primary) hypertension: Secondary | ICD-10-CM

## 2020-11-26 DIAGNOSIS — Z720 Tobacco use: Secondary | ICD-10-CM

## 2020-11-26 DIAGNOSIS — E119 Type 2 diabetes mellitus without complications: Secondary | ICD-10-CM

## 2020-11-26 NOTE — Progress Notes (Signed)
Subjective:    Patient ID: Matthew Mcgrath, male    DOB: 12-23-1945, 75 y.o.   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  The patient notes that he has not had any claudication, rest pain or other issues.  The patient denies any abdominal pain or any signs or symptoms of distal embolization.  He also reports that he stopped smoking and spent nearly a year.  He also reports that his blood pressures have been under good control.  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  The previous measurement was 1.9 cm.  The left common iliac measures 1.3 cm it was previously measured at 1.5 cm.  Largest previous abdominal aortic diameter was 3.0 cm today and measures 2.7 cm.  Review of Systems  Musculoskeletal:  Positive for arthralgias and gait problem.  Neurological:  Positive for weakness.  All other systems reviewed and are negative.     Objective:   Physical Exam Vitals reviewed.  HENT:     Head: Normocephalic.  Cardiovascular:     Rate and Rhythm: Normal rate.  Pulmonary:     Effort: Pulmonary effort is normal.  Skin:    General: Skin is warm and dry.  Neurological:     Mental Status: He is alert.     Motor: Weakness present.     Gait: Gait abnormal.  Psychiatric:        Attention and Perception: Attention normal.        Mood and Affect: Affect is flat.        Speech: Speech normal.        Behavior: Behavior normal.        Thought Content: Thought content normal.        Judgment: Judgment normal.    BP (!) 145/87   Pulse 85   Ht 5' 8"  (1.727 m)   Wt 181 lb (82.1 kg)   BMI 27.52 kg/m   Past Medical History:  Diagnosis Date   [place]    Congestive heart failure (HCC)    Diabetes mellitus type 2, controlled (HCC)    Elevated lipids    HTN (hypertension)     Social  History   Socioeconomic History   Marital status: Single    Spouse name: Not on file   Number of children: Not on file   Years of education: Not on file   Highest education level: Not on file  Occupational History   Not on file  Tobacco Use   Smoking status: Former    Packs/day: 0.25    Years: 30.00    Pack years: 7.50    Types: Cigarettes    Quit date: 02/11/2020    Years since quitting: 0.7   Smokeless tobacco: Never  Vaping Use   Vaping Use: Never used  Substance and Sexual Activity   Alcohol use: No   Drug use: Not Currently    Frequency: 2.0 times per week    Types: Marijuana    Comment: past use   Sexual activity: Not on file  Other Topics Concern   Not on file  Social History Narrative   Not on file   Social Determinants of Health   Financial Resource Strain: Not on file  Food Insecurity: Not on file  Transportation Needs:  Not on file  Physical Activity: Not on file  Stress: Not on file  Social Connections: Not on file  Intimate Partner Violence: Not on file    Past Surgical History:  Procedure Laterality Date   CARDIAC CATHETERIZATION N/A 04/14/2015   Procedure: Left Heart Cath and Coronary Angiography;  Surgeon: [name], MD;  Location: [facility];  Service: Cardiovascular;  Laterality: N/A;   CATARACT EXTRACTION W/ INTRAOCULAR LENS  IMPLANT, BILATERAL Left    COLONOSCOPY WITH PROPOFOL N/A 04/08/2020   Procedure: COLONOSCOPY WITH PROPOFOL;  Surgeon: Toledo, Benay Pike, MD;  Location: [facility];  Service: Gastroenterology;  Laterality: N/A;   HERNIA REPAIR     IMPLANTABLE CARDIOVERTER DEFIBRILLATOR (ICD) GENERATOR CHANGE Left 07/03/2015   Procedure: ICD IMPLANT single chamber;  Surgeon: Marzetta Board, MD;  Location: [facility];  Service: Cardiovascular;  Laterality: Left;    Family History  Problem Relation Age of Onset   Heart failure Mother    Heart disease Mother    Alcoholism Father    Hypertension Sister    Diabetes Sister     Hypertension Brother    Diabetes Brother    Colon cancer Brother    Multiple myeloma Brother     No Known Allergies  CBC Latest Ref Rng & Units 11/23/2020 10/07/2020 08/07/2020  WBC 4.0 - 10.5 K/uL 3.3(L) 3.3(L) 3.5(L)  Hemoglobin 13.0 - 17.0 g/dL 9.4(L) 9.6(L) 10.6(L)  Hematocrit 39.0 - 52.0 % 30.8(L) 30.2(L) 32.1(L)  Platelets 150 - 400 K/uL 207 146(L) 150      CMP     Component Value Date/Time   NA 136 11/23/2020 0953   NA 141 03/14/2014 1557   K 4.4 11/23/2020 0953   K 3.9 03/14/2014 1557   CL 105 11/23/2020 0953   CL 108 (H) 03/14/2014 1557   CO2 23 11/23/2020 0953   CO2 27 03/14/2014 1557   GLUCOSE 243 (H) 11/23/2020 0953   GLUCOSE 232 (H) 03/14/2014 1557   BUN 24 (H) 11/23/2020 0953   BUN 10 03/14/2014 1557   CREATININE 0.87 11/23/2020 0953   CREATININE 0.76 03/14/2014 1557   CALCIUM 9.1 11/23/2020 0953   CALCIUM 8.4 (L) 03/14/2014 1557   PROT 7.3 11/23/2020 0953   PROT 6.8 03/14/2014 1557   ALBUMIN 3.7 11/23/2020 0953   ALBUMIN 3.0 (L) 03/14/2014 1557   AST 15 11/23/2020 0953   AST 47 (H) 03/14/2014 1557   ALT 14 11/23/2020 0953   ALT 79 (H) 03/14/2014 1557   ALKPHOS 75 11/23/2020 0953   ALKPHOS 115 03/14/2014 1557   BILITOT 0.5 11/23/2020 0953   BILITOT 0.5 03/14/2014 1557   GFRNONAA >60 11/23/2020 0953   GFRNONAA >60 03/14/2014 1557   GFRAA >60 06/23/2015 1019   GFRAA >60 03/14/2014 1557     No results found.     Assessment & Plan:   1. Iliac artery aneurysm (HCC) Today the patient's iliac artery aneurysm has increased from 1.9cm to 2.4 cm.  Fortunately the patient's blood pressure is under much better control as well as he has stopped smoking.  Currently less than 3 cm does not pose an immediate risk.  We discussed that typically we intervene at 30 cm or larger.  Patient is advised to continue to control blood pressure as well as to continue to abstain.  We will have the patient return to office 1 year with noninvasive studies or sooner if issues  arise.  2. Essential (primary) hypertension Continue antihypertensive medications as already ordered, these  medications have been reviewed and there are no changes at this time.   3. Type 2 diabetes mellitus without complication, without long-term current use of insulin (HCC) Continue hypoglycemic medications as already ordered, these medications have been reviewed and there are no changes at this time.  Hgb A1C to be monitored as already arranged by primary service   4. Tobacco abuse The patient has stopped smoking.  Continued abstinence is encouraged   Current Outpatient Medications on File Prior to Visit  Medication Sig Dispense Refill   acetaminophen (TYLENOL) 650 MG CR tablet Take 650 mg by mouth every 8 (eight) hours as needed for pain.     aspirin EC 81 MG tablet @@TAKE  1 TABLET BY MOUTH ONCE DAILY.     carvedilol (COREG) 6.25 MG tablet Take 6.25 mg by mouth 2 (two) times daily.     cholecalciferol (VITAMIN D3) 25 MCG (1000 UNIT) tablet Take 1,000 Units by mouth daily.     darolutamide (NUBEQA) 300 MG tablet Take 300 mg by mouth 2 (two) times daily with a meal.     Docosahexaenoic Acid (DHA OMEGA 3 PO) 1,000 mg.     furosemide (LASIX) 40 MG tablet Take 40 mg by mouth daily.     glipiZIDE (GLUCOTROL) 5 MG tablet Take by mouth daily before breakfast.     glucose blood (TRUE METRIX BLOOD GLUCOSE TEST) test strip 1 each by Other route as needed for other. Use as instructed     LIDODERM 5 % Apply 1 patch to skin every morning as needed for L lower back pain. Remove after 12 hours. Call for refills     lisinopril (ZESTRIL) 10 MG tablet Take 10 mg by mouth daily.     magnesium oxide (MAG-OX) 400 MG tablet Take 400 mg by mouth 2 (two) times daily.     metFORMIN (GLUCOPHAGE) 500 MG tablet Take by mouth 2 (two) times daily with a meal.     MIRALAX 17 GM/SCOOP powder Take 17 g by mouth daily.     Multiple Vitamin (MULTIVITAMIN ADULT PO) SMARTSIG:1 Tablet(s) By Mouth Daily     Omega-3 Fatty  Acids (FISH OIL PO) Take by mouth.     potassium chloride (KLOR-CON) 10 MEQ tablet Take 10 mEq by mouth daily.     simvastatin (ZOCOR) 40 MG tablet Take 40 mg by mouth daily at 6 PM.      spironolactone (ALDACTONE) 25 MG tablet Take 25 mg by mouth daily.     TRUEplus Lancets 28G MISC by Does not apply route.     No current facility-administered medications on file prior to visit.    There are no Patient Instructions on file for this visit. No follow-ups on file.   Kris Hartmann, NP

## 2021-01-01 ENCOUNTER — Encounter: Payer: Self-pay | Admitting: Oncology

## 2021-01-01 ENCOUNTER — Inpatient Hospital Stay: Payer: Medicare (Managed Care) | Attending: Oncology

## 2021-01-01 ENCOUNTER — Other Ambulatory Visit: Payer: Self-pay

## 2021-01-01 ENCOUNTER — Inpatient Hospital Stay (HOSPITAL_BASED_OUTPATIENT_CLINIC_OR_DEPARTMENT_OTHER): Payer: Medicare (Managed Care) | Admitting: Oncology

## 2021-01-01 VITALS — BP 139/82 | HR 93 | Temp 96.4°F | Wt 195.0 lb

## 2021-01-01 DIAGNOSIS — C7951 Secondary malignant neoplasm of bone: Secondary | ICD-10-CM

## 2021-01-01 DIAGNOSIS — I509 Heart failure, unspecified: Secondary | ICD-10-CM | POA: Diagnosis not present

## 2021-01-01 DIAGNOSIS — C61 Malignant neoplasm of prostate: Secondary | ICD-10-CM | POA: Diagnosis present

## 2021-01-01 DIAGNOSIS — E785 Hyperlipidemia, unspecified: Secondary | ICD-10-CM | POA: Insufficient documentation

## 2021-01-01 DIAGNOSIS — M545 Low back pain, unspecified: Secondary | ICD-10-CM | POA: Insufficient documentation

## 2021-01-01 DIAGNOSIS — Z87891 Personal history of nicotine dependence: Secondary | ICD-10-CM | POA: Diagnosis not present

## 2021-01-01 DIAGNOSIS — Z7982 Long term (current) use of aspirin: Secondary | ICD-10-CM | POA: Diagnosis not present

## 2021-01-01 DIAGNOSIS — Z7984 Long term (current) use of oral hypoglycemic drugs: Secondary | ICD-10-CM | POA: Insufficient documentation

## 2021-01-01 DIAGNOSIS — D649 Anemia, unspecified: Secondary | ICD-10-CM

## 2021-01-01 DIAGNOSIS — Z5111 Encounter for antineoplastic chemotherapy: Secondary | ICD-10-CM

## 2021-01-01 DIAGNOSIS — Z79899 Other long term (current) drug therapy: Secondary | ICD-10-CM | POA: Insufficient documentation

## 2021-01-01 DIAGNOSIS — E119 Type 2 diabetes mellitus without complications: Secondary | ICD-10-CM | POA: Insufficient documentation

## 2021-01-01 DIAGNOSIS — I110 Hypertensive heart disease with heart failure: Secondary | ICD-10-CM | POA: Diagnosis not present

## 2021-01-01 LAB — COMPREHENSIVE METABOLIC PANEL
ALT: 13 U/L (ref 0–44)
AST: 16 U/L (ref 15–41)
Albumin: 4.3 g/dL (ref 3.5–5.0)
Alkaline Phosphatase: 59 U/L (ref 38–126)
Anion gap: 10 (ref 5–15)
BUN: 27 mg/dL — ABNORMAL HIGH (ref 8–23)
CO2: 23 mmol/L (ref 22–32)
Calcium: 9.5 mg/dL (ref 8.9–10.3)
Chloride: 101 mmol/L (ref 98–111)
Creatinine, Ser: 0.76 mg/dL (ref 0.61–1.24)
GFR, Estimated: >60 mL/min (ref >60)
Glucose, Bld: 213 mg/dL — ABNORMAL HIGH (ref 70–99)
Potassium: 4.4 mmol/L (ref 3.5–5.1)
Sodium: 134 mmol/L — ABNORMAL LOW (ref 135–145)
Total Bilirubin: 0.5 mg/dL (ref 0.3–1.2)
Total Protein: 7.9 g/dL (ref 6.5–8.1)

## 2021-01-01 LAB — PSA: Prostatic Specific Antigen: 0.01 ng/mL (ref 0.00–4.00)

## 2021-01-01 LAB — CBC WITH DIFFERENTIAL/PLATELET
Abs Immature Granulocytes: 0.02 10*3/uL (ref 0.00–0.07)
Basophils Absolute: 0 10*3/uL (ref 0.0–0.1)
Basophils Relative: 1 %
Eosinophils Absolute: 0.1 10*3/uL (ref 0.0–0.5)
Eosinophils Relative: 3 %
HCT: 36.6 % — ABNORMAL LOW (ref 39.0–52.0)
Hemoglobin: 11.3 g/dL — ABNORMAL LOW (ref 13.0–17.0)
Immature Granulocytes: 1 %
Lymphocytes Relative: 13 %
Lymphs Abs: 0.5 10*3/uL — ABNORMAL LOW (ref 0.7–4.0)
MCH: 30.5 pg (ref 26.0–34.0)
MCHC: 30.9 g/dL (ref 30.0–36.0)
MCV: 98.7 fL (ref 80.0–100.0)
Monocytes Absolute: 0.3 10*3/uL (ref 0.1–1.0)
Monocytes Relative: 7 %
Neutro Abs: 3 10*3/uL (ref 1.7–7.7)
Neutrophils Relative %: 75 %
Platelets: 205 10*3/uL (ref 150–400)
RBC: 3.71 MIL/uL — ABNORMAL LOW (ref 4.22–5.81)
RDW: 14.9 % (ref 11.5–15.5)
WBC: 4 10*3/uL (ref 4.0–10.5)
nRBC: 0 % (ref 0.0–0.2)

## 2021-01-01 NOTE — Progress Notes (Signed)
Hematology/Oncology progress note [facility] [phone] [phone]   Patient Care Team: [facility], [place] as PCP - General [name], MD as Consulting Physician (Cardiology) Alisa Graff, FNP as Nurse Practitioner (Family Medicine)  REFERRING PROVIDER: [facility], [place]*  CHIEF COMPLAINTS/REASON FOR VISIT:  metastatic prostate cancer  HISTORY OF PRESENTING ILLNESS:   Matthew Mcgrath is a  75 y.o.  male with PMH listed below was seen in consultation at the request of  Inc, [place]  for evaluation of metastatic prostate cancer. Patient was accompanied by her sister who he currently lives with.  History was mainly obtained from sister as well as reviewing medical records.  Patient is a poor historian. Per sister, she has noticed patient having increased urgency and frequency of urination.  Patient was evaluated by urologist Dr. Diamantina Providence in January 2022.  PSA was elevated at 30.4 in January 2022 04/01/2020, prostate biopsy showed  All cores were positive for high risk prostate cancer, primarily Gleason score 5+4 = 9, with max core involvement of 100% and perineural invasion present. 04/21/2020, CT abdomen pelvis showed a sclerotic lesion in the lumbar spine concerning for probable metastatic disease.  No definitive extra skeletal metastatic disease noted elsewhere in the abdomen or pelvis.  Mild circumferential bladder wall thickening.  Aortic atherosclerosis. 04/27/2020, bone scan showed foci of abnormal uptake noted in the mid thoracic and upper lumbar spine concerning for metastatic disease.  Patient reports some lower back pain intermittently.  He currently lives with his sister.   Past medical history includes CHF, hypertension, hyper lipidemia, diabetes type 2 Per sister, family history is positive for colon cancer and another brother, multiple myeloma in the other brother.  04/16/2020 Firmagon 240 mg  loading dose #06/11/2020 patient went to [facility] for second opinion was seen by [name] who agrees with continuing ADT with switch to Eligard 45 mg.  He is due to proceed with the treatments today.  [facility]icated with [facility] [name], patient will receive treatment at [place]. [name] also recommends to start darolutamide and authorization process has been initiated at [facility]. #07/06/2020 started darolutamide 600 mg twice daily # definitive treatment for prostate and pelvic node- Discussed with [facility] [name] and I appreciate his expert opinion of darolutamide concurrent use with RT and ADT # 10/19/2020 finished definitive RT. To prostate cancer and pelvic lymph node treatments.  INTERVAL HISTORY Matthew Mcgrath is a 75 y.o. male who has above history reviewed by me today presents for follow up visit for management of metastatic prostate cancer Problems and complaints are listed below: He was accompanied by his sister.  He has not had Eligard injection yet.  Review of Systems  Constitutional:  Positive for fatigue. Negative for appetite change, chills, fever and unexpected weight change.  HENT:   Negative for hearing loss and voice change.   Eyes:  Negative for eye problems and icterus.  Respiratory:  Negative for chest tightness, cough and shortness of breath.   Cardiovascular:  Negative for chest pain and leg swelling.  Gastrointestinal:  Negative for abdominal distention and abdominal pain.  Endocrine: Negative for hot flashes.  Genitourinary:  Negative for difficulty urinating, dysuria and frequency.   Musculoskeletal:  Positive for back pain. Negative for arthralgias.  Skin:  Negative for itching and rash.  Neurological:  Negative for light-headedness and numbness.  Hematological:  Negative for adenopathy. Does not bruise/bleed easily.  Psychiatric/Behavioral:  Negative for confusion.    MEDICAL HISTORY:  Past  Medical History:  Diagnosis Date   Cardiomyopathy ([place])    Congestive heart failure  (HCC)    Diabetes mellitus type 2, controlled ([place])    Elevated lipids    HTN (hypertension)     SURGICAL HISTORY: Past Surgical History:  Procedure Laterality Date   CARDIAC CATHETERIZATION N/A 04/14/2015   Procedure: Left Heart Cath and Coronary Angiography;  Surgeon: [name], MD;  Location: [facility];  Service: Cardiovascular;  Laterality: N/A;   CATARACT EXTRACTION W/ INTRAOCULAR LENS  IMPLANT, BILATERAL Left    COLONOSCOPY WITH PROPOFOL N/A 04/08/2020   Procedure: COLONOSCOPY WITH PROPOFOL;  Surgeon: Toledo, Benay Pike, MD;  Location: [facility] ENDOSCOPY;  Service: Gastroenterology;  Laterality: N/A;   HERNIA REPAIR     IMPLANTABLE CARDIOVERTER DEFIBRILLATOR (ICD) GENERATOR CHANGE Left 07/03/2015   Procedure: ICD IMPLANT single chamber;  Surgeon: Marzetta Board, MD;  Location: [facility] ORS;  Service: Cardiovascular;  Laterality: Left;    SOCIAL HISTORY: Social History   Socioeconomic History   Marital status: Single    Spouse name: Not on file   Number of children: Not on file   Years of education: Not on file   Highest education level: Not on file  Occupational History   Not on file  Tobacco Use   Smoking status: Former    Packs/day: 0.25    Years: 30.00    Pack years: 7.50    Types: Cigarettes    Quit date: 02/11/2020    Years since quitting: 0.8   Smokeless tobacco: Never  Vaping Use   Vaping Use: Never used  Substance and Sexual Activity   Alcohol use: No   Drug use: Not Currently    Frequency: 2.0 times per week    Types: Marijuana    Comment: past use   Sexual activity: Not on file  Other Topics Concern   Not on file  Social History Narrative   Not on file   Social Determinants of Health   Financial Resource Strain: Not on file  Food Insecurity: Not on file  Transportation Needs: Not on file  Physical Activity: Not on file  Stress: Not on file  Social Connections: Not on file  Intimate Partner Violence: Not on file    FAMILY  HISTORY: Family History  Problem Relation Age of Onset   Heart failure Mother    Heart disease Mother    Alcoholism Father    Hypertension Sister    Diabetes Sister    Hypertension Brother    Diabetes Brother    Colon cancer Brother    Multiple myeloma Brother     ALLERGIES:  has No Known Allergies.  MEDICATIONS:  Current Outpatient Medications  Medication Sig Dispense Refill   acetaminophen (TYLENOL) 650 MG CR tablet Take 650 mg by mouth every 8 (eight) hours as needed for pain.     aspirin EC 81 MG tablet @@TAKE  1 TABLET BY MOUTH ONCE DAILY.     carvedilol (COREG) 6.25 MG tablet Take 6.25 mg by mouth 2 (two) times daily.     cholecalciferol (VITAMIN D3) 25 MCG (1000 UNIT) tablet Take 1,000 Units by mouth daily.     darolutamide (NUBEQA) 300 MG tablet Take 300 mg by mouth 2 (two) times daily with a meal.     Docosahexaenoic Acid (DHA OMEGA 3 PO) 1,000 mg.     furosemide (LASIX) 40 MG tablet Take 40 mg by mouth daily.     glipiZIDE (GLUCOTROL) 5 MG tablet Take by mouth daily  before breakfast.     glucose blood (TRUE METRIX BLOOD GLUCOSE TEST) test strip 1 each by Other route as needed for other. Use as instructed     LIDODERM 5 % Apply 1 patch to skin every morning as needed for L lower back pain. Remove after 12 hours. Call for refills     lisinopril (ZESTRIL) 10 MG tablet Take 10 mg by mouth daily.     magnesium oxide (MAG-OX) 400 MG tablet Take 400 mg by mouth 2 (two) times daily.     metFORMIN (GLUCOPHAGE) 500 MG tablet Take by mouth 2 (two) times daily with a meal.     MIRALAX 17 GM/SCOOP powder Take 17 g by mouth daily.     Multiple Vitamin (MULTIVITAMIN ADULT PO) SMARTSIG:1 Tablet(s) By Mouth Daily     Omega-3 Fatty Acids (FISH OIL PO) Take by mouth.     potassium chloride (KLOR-CON) 10 MEQ tablet Take 10 mEq by mouth daily.     simvastatin (ZOCOR) 40 MG tablet Take 40 mg by mouth daily at 6 PM.      spironolactone (ALDACTONE) 25 MG tablet Take 25 mg by mouth daily.      TRUEplus Lancets 28G MISC by Does not apply route.     No current facility-administered medications for this visit.     PHYSICAL EXAMINATION: ECOG PERFORMANCE STATUS: 1 - Symptomatic but completely ambulatory Vitals:   01/01/21 1040  BP: 139/82  Pulse: 93  Temp: (!) 96.4 F (35.8 C)   Filed Weights   01/01/21 1040  Weight: 195 lb (88.5 kg)    Physical Exam Constitutional:      General: He is not in acute distress. HENT:     Head: Normocephalic and atraumatic.  Eyes:     General: No scleral icterus. Cardiovascular:     Rate and Rhythm: Normal rate and regular rhythm.     Heart sounds: Normal heart sounds.  Pulmonary:     Effort: Pulmonary effort is normal. No respiratory distress.     Breath sounds: No wheezing.  Abdominal:     General: Bowel sounds are normal. There is no distension.     Palpations: Abdomen is soft.  Musculoskeletal:        General: No deformity. Normal range of motion.     Cervical back: Normal range of motion and neck supple.  Skin:    General: Skin is warm and dry.     Findings: No erythema or rash.  Neurological:     Mental Status: He is alert and oriented to person, place, and time. Mental status is at baseline.     Cranial Nerves: No cranial nerve deficit.     Coordination: Coordination normal.  Psychiatric:        Mood and Affect: Mood normal.    LABORATORY DATA:  I have reviewed the data as listed Lab Results  Component Value Date   WBC 4.0 01/01/2021   HGB 11.3 (L) 01/01/2021   HCT 36.6 (L) 01/01/2021   MCV 98.7 01/01/2021   PLT 205 01/01/2021   Recent Labs    10/07/20 1055 11/23/20 0953 01/01/21 1015  NA 132* 136 134*  K 4.5 4.4 4.4  CL 99 105 101  CO2 24 23 23   GLUCOSE 229* 243* 213*  BUN 36* 24* 27*  CREATININE 1.05 0.87 0.76  CALCIUM 9.2 9.1 9.5  GFRNONAA >60 >60 >60  PROT 7.4 7.3 7.9  ALBUMIN 3.9 3.7 4.3  AST 19 15 16   ALT 13  14 13  ALKPHOS 61 75 59  BILITOT 0.3 0.5 0.5    Iron/TIBC/Ferritin/ %Sat     Component Value Date/Time   IRON 82 11/23/2020 0953   TIBC 335 11/23/2020 0953   FERRITIN 221 11/23/2020 0953   IRONPCTSAT 25 11/23/2020 0953     Tests done at [facility] 06/10/20  Tests: (1) Comp. Metabolic Panel (14) (591638)    Glucose              [H]  220 mg/dL                   65-99    BUN                       18 mg/dL                    8-27    Creatinine                0.84 mg/dL                  0.76-1.27    eGFR                      93 mL/min/1.73              >59    BUN/Creatinine Ratio      21                          10-24    Sodium                    136 mmol/L                  134-144    Potassium                 4.9 mmol/L                  3.5-5.2    Chloride                  98 mmol/L                   96-106   Carbon Dioxide, Total                         [L]  19 mmol/L                   20-29    Calcium                   9.8 mg/dL                   8.6-10.2    Protein, Total            7.0 g/dL                    6.0-8.5    Albumin                   4.4 g/dL                    3.7-4.7    Globulin, Total           2.6 g/dL  1.5-4.5    A/G Ratio                 1.7                         1.2-2.2    Bilirubin, Total          0.4 mg/dL                   0.0-1.2    Alkaline Phosphatase      65 IU/L                     44-121    AST (SGOT)                15 IU/L                     0-40    ALT (SGPT)                13 IU/L                     0-44   Tests: (1) CBC With Differential/Platelet (005009)    WBC                       4.9 x10E3/uL                3.4-10.8    RBC                  [L]  4.12 x10E6/uL               4.14-5.80    Hemoglobin           [L]  12.5 g/dL                   13.0-17.7    Hematocrit                38.7 %                      37.5-51.0    MCV                       94 fL                       79-97    MCH                       30.3 pg                     26.6-33.0    MCHC                      32.3 g/dL                    31.5-35.7    RDW                       11.6 %                      11.6-15.4    Platelets                 200 x10E3/uL  150-450    Neutrophils               55 %                        Not Estab.    Lymphs                    32 %                        Not Estab.    Monocytes                 9 %                         Not Estab.    Eos                       3 %                         Not Estab.    Basos                     1 %                         Not Estab.   Neutrophils (Absolute)                              2.8 x10E3/uL                1.4-7.0    Lymphs (Absolute)         1.6 x10E3/uL                0.7-3.1    Monocytes(Absolute)       0.4 x10E3/uL                0.1-0.9    Eos (Absolute)            0.1 x10E3/uL                0.0-0.4    Baso (Absolute)           0.0 x10E3/uL                0.0-0.2   Immature Granulocytes                              0 %                         Not Estab.    Immature Grans (Abs)      0.0 x10E3/uL                0.0-0.1    PSA 4.8   RADIOGRAPHIC STUDIES: I have personally reviewed the radiological images as listed and agreed with the findings in the report. VAS [place] AAA DUPLEX  Result Date: 12/04/2020 ABDOMINAL AORTA STUDY Patient Name:  DONTA FUSTER  Date of Exam:   11/26/2020 Medical Rec #: [id number]       Accession #:    [id number] Date of Birth: [birth year]       Patient Gender: M Patient Age:  75 years Exam Location:  [facility] Procedure:      VAS [place] AAA DUPLEX Referring Phys: Corene Cornea DEW --------------------------------------------------------------------------------  Indications: Aneurysmal Iliac Arteries  Comparison Study: 09/12/2019 Performing Technologist: Almira Coaster RVS  Examination Guidelines: A complete evaluation includes B-mode imaging, spectral Doppler, color Doppler, and power Doppler as needed of all accessible portions of each vessel. Bilateral testing is considered an integral part of a complete  examination. Limited examinations for reoccurring indications may be performed as noted.  Abdominal Aorta Findings: +-----------+-------+----------+----------+----------+--------+--------+ Location   AP (cm)Trans (cm)PSV (cm/s)Waveform  ThrombusComments +-----------+-------+----------+----------+----------+--------+--------+ Proximal   2.43   2.54      79        monophasic                 +-----------+-------+----------+----------+----------+--------+--------+ Mid        2.20   2.43      82        monophasic                 +-----------+-------+----------+----------+----------+--------+--------+ Distal     2.48   2.70      108       monophasic                 +-----------+-------+----------+----------+----------+--------+--------+ RT CIA Prox2.4    2.3       54        monophasic                 +-----------+-------+----------+----------+----------+--------+--------+ LT CIA Prox1.3    1.3       67        monophasic                 +-----------+-------+----------+----------+----------+--------+--------+  Summary: Abdominal Aorta: There is evidence of abnormal dilatation of the distal Abdominal aorta. There is evidence of abnormal dilation of the Right Common Iliac artery. The largest aortic measurement is 2.7 cm. In comparison to previous study ; The Right CIA appears to be increased in Diameter from 1.9cms to 2.4cms. The Left CIA appears to be decreased from 1.5cms to 1.3cms. The largest aortic diameter has decreased compared to prior exam. Previous diameter measurement was 3.0 cm obtained on 09/12/2019.  *See table(s) above for measurements and observations.  Electronically signed by Leotis Pain MD on 12/04/2020 at 8:53:10 AM.    Final       ASSESSMENT & PLAN:  1. Prostate cancer ([place])   2. Normocytic anemia   3. Metastatic cancer to bone ([place])   4. Encounter for antineoplastic chemotherapy   Cancer Staging Prostate cancer [facility]) Staging form: Prostate, AJCC 8th  Edition - Clinical stage from 05/11/2020: Stage IVB (cTX, cN0, cM1, PSA: 30) - Signed by [name], MD on 05/11/2020   #Metastatic prostate cancer to bone. Check CBC CMP PSA. -Labs are reviewed.  PSA has decreased to 10/07/2020 0.02 Eligard 45 mg Q6 months with [facility]-he is overdue for his Eligard.  Advised patient/sister to talk to provider at [facility]. Labs reviewed and discussed with patient. Continue darolutamide.  600 mg twice daily, obtain CT for restaging.  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 TMB 3.1 mut/Mb, MS stable, PD-L1 1%  Consider possible rucaparib/olaparib in subsequent lines of treatment I recommend genetic testing. Refer to gDietitian.  #Normocytic anemia, hemoglobin improved to 11.3.   Supportive care measures are necessary for patient well-being and will be provided as necessary. We spent sufficient time to discuss  many aspect of care, questions were answered to patient's satisfaction.   All questions were answered. The patient knows to call the clinic with any problems questions or concerns. cc  [facility], [place]    Return of visit: 6 weeks    [name], MD, PhD 01/01/2021

## 2021-01-13 ENCOUNTER — Inpatient Hospital Stay: Payer: Medicare (Managed Care)

## 2021-01-13 ENCOUNTER — Inpatient Hospital Stay: Payer: Medicare (Managed Care) | Attending: Oncology | Admitting: Licensed Clinical Social Worker

## 2021-01-13 ENCOUNTER — Other Ambulatory Visit: Payer: Self-pay

## 2021-01-13 ENCOUNTER — Encounter: Payer: Self-pay | Admitting: Licensed Clinical Social Worker

## 2021-01-13 DIAGNOSIS — Z803 Family history of malignant neoplasm of breast: Secondary | ICD-10-CM

## 2021-01-13 DIAGNOSIS — Z800 Family history of malignant neoplasm of digestive organs: Secondary | ICD-10-CM | POA: Diagnosis not present

## 2021-01-13 DIAGNOSIS — C61 Malignant neoplasm of prostate: Secondary | ICD-10-CM | POA: Diagnosis not present

## 2021-01-13 NOTE — Progress Notes (Signed)
REFERRING PROVIDER: [name], MD [place],  [place] [address]  PRIMARY PROVIDER:  [facility], [facility]  PRIMARY REASON FOR VISIT:  1. Family history of colon cancer   2. Family history of breast cancer   3. Prostate cancer ([place])      HISTORY OF PRESENT ILLNESS:   Mr. Gosse, a 75 y.o. male, was seen for a [facility] cancer genetics consultation at the request of Dr. Tasia Catchings due to a personal and family history of cancer.  Mr. Goodbar presents to clinic today to discuss the possibility of a hereditary predisposition to cancer, genetic testing, and to further clarify his future cancer risks, as well as potential cancer risks for family members.   In 2022, at the age of 75 Mr. Blyden was diagnosed with metastatic prostate cancer. 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 He reports past colonoscopies have been normal.   CANCER HISTORY:  Oncology History  Prostate cancer ([place])  05/11/2020 Initial Diagnosis   Prostate cancer ([place])   05/11/2020 Cancer Staging   Staging form: Prostate, AJCC 8th Edition - Clinical stage from 05/11/2020: Stage IVB (cTX, cN0, cM1, PSA: 30) - Signed by [name], MD on 05/11/2020 Prostate specific antigen (PSA) range: 20 or greater       Past Medical History:  Diagnosis Date   Cardiomyopathy ([place])    Congestive heart failure ([place])    Diabetes mellitus type 2, controlled ([place])    Elevated lipids    Family history of breast cancer    Family history of colon cancer    HTN (hypertension)     Past Surgical History:  Procedure Laterality Date   CARDIAC CATHETERIZATION N/A 04/14/2015   Procedure: Left Heart Cath and Coronary Angiography;  Surgeon: [name], MD;  Location: [facility];  Service: Cardiovascular;  Laterality: N/A;   CATARACT EXTRACTION W/ INTRAOCULAR LENS  IMPLANT, BILATERAL Left    COLONOSCOPY WITH PROPOFOL N/A 04/08/2020   Procedure: COLONOSCOPY WITH PROPOFOL;   Surgeon: Toledo, Benay Pike, MD;  Location: [facility];  Service: Gastroenterology;  Laterality: N/A;   HERNIA REPAIR     IMPLANTABLE CARDIOVERTER DEFIBRILLATOR (ICD) GENERATOR CHANGE Left 07/03/2015   Procedure: ICD IMPLANT single chamber;  Surgeon: Marzetta Board, MD;  Location: [facility];  Service: Cardiovascular;  Laterality: Left;    Social History   Socioeconomic History   Marital status: Single    Spouse name: Not on file   Number of children: Not on file   Years of education: Not on file   Highest education level: Not on file  Occupational History   Not on file  Tobacco Use   Smoking status: Former    Packs/day: 0.25    Years: 30.00    Pack years: 7.50    Types: Cigarettes    Quit date: 02/11/2020    Years since quitting: 0.9   Smokeless tobacco: Never  Vaping Use   Vaping Use: Never used  Substance and Sexual Activity   Alcohol use: No   Drug use: Not Currently    Frequency: 2.0 times per week    Types: Marijuana    Comment: past use   Sexual activity: Not on file  Other Topics Concern   Not on file  Social History Narrative   Not on file   Social Determinants of Health   Financial Resource Strain: Not on file  Food Insecurity: Not on file  Transportation Needs: Not  on file  Physical Activity: Not on file  Stress: Not on file  Social Connections: Not on file     FAMILY HISTORY:  We obtained a detailed, 4-generation family history.  Significant diagnoses are listed below: Family History  Problem Relation Age of Onset   Heart failure Mother    Heart disease Mother    Alcoholism Father    Hypertension Sister    Diabetes Sister    Hypertension Brother    Diabetes Brother    Colon cancer Brother        vs prostate   Multiple myeloma Brother    Breast cancer Cousin    Mr. Cassell does not have children. He has a fraternal twin brother, 3 other brothers, 4 sisters, and 1 maternal half sister. One of his brothers had prostate or colon cancer and died at  70. Another brother had multiple myeloma and died at 62.   Mr. Callow's mother died at 87, no cancer history. Patient had 10 maternal aunts/uncles, no known cancers. A maternal cousin had breast cancer over age 50, and another cousin had cancer, unknown type, over age 50. Maternal grandmother died at 90, grandfather died at 104.  Mr. Stigler's father died at 58 of a heart attack. Patient had 1 full paternal uncles and several half uncles. One of these half uncles had an unknown cancer. Two cousins had unknown cancers. No information about paternal grandparents.  Mr. Weltman is unaware of previous family history of genetic testing for hereditary cancer risks. Patient's maternal ancestors are of African American and Native American descent, and paternal ancestors are of African American descent. There is no reported Ashkenazi Jewish ancestry. There is no known consanguinity.   GENETIC COUNSELING ASSESSMENT: Mr. Carfagno is a 75 y.o. male with a personal and family history which is somewhat suggestive of a hereditary cancer syndrome and predisposition to cancer. We, therefore, discussed and recommended the following at today's visit.   DISCUSSION: We discussed that approximately 10% of prostate cancer is hereditary. Most cases of hereditary prostate cancer are associated with BRCA1/BRCA2 genes, although there are other genes associated with hereditary cancer as well. Cancers and risks are gene specific.  We discussed that testing is beneficial for several reasons including knowing about other cancer risks, identifying potential screening and risk-reduction options that may be appropriate, and to understand if other family members could be at risk for cancer and allow them to undergo genetic testing.   We reviewed the characteristics, features and inheritance patterns of hereditary cancer syndromes. We also discussed genetic testing, including the appropriate family members to test, the process of testing,  insurance coverage and turn-around-time for results. We discussed the implications of a negative, positive and/or variant of uncertain significant result. We recommended Mr. Corl pursue genetic testing for the [facility] Multi-Cancer+RNA gene panel.   The Multi-Cancer Panel + RNA offered by [facility] includes sequencing and/or deletion duplication testing of the following 84 genes: AIP, ALK, APC, ATM, AXIN2,BAP1,  BARD1, BLM, BMPR1A, BRCA1, BRCA2, BRIP1, CASR, CDC73, CDH1, CDK4, CDKN1B, CDKN1C, CDKN2A (p14ARF), CDKN2A (p16INK4a), CEBPA, CHEK2, CTNNA1, DICER1, DIS3L2, EGFR (c.2369C>T, p.Thr790Met variant only), EPCAM (Deletion/duplication testing only), FH, FLCN, GATA2, GPC3, GREM1 (Promoter region deletion/duplication testing only), HOXB13 (c.251G>A, p.Gly84Glu), HRAS, KIT, MAX, MEN1, MET, MITF (c.952G>A, p.Glu318Lys variant only), MLH1, MSH2, MSH3, MSH6, MUTYH, NBN, NF1, NF2, NTHL1, PALB2, PDGFRA, PHOX2B, PMS2, POLD1, POLE, POT1, PRKAR1A, PTCH1, PTEN, RAD50, RAD51C, RAD51D, RB1, RECQL4, RET, RUNX1, SDHAF2, SDHA (sequence changes only), SDHB, SDHC, SDHD, SMAD4, SMARCA4, SMARCB1,   SMARCE1, STK11, SUFU, TERC, TERT, TMEM127, TP53, TSC1, TSC2, VHL, WRN and WT1.  Based on Mr. Khun's personal history of cancer, he meets medical criteria for genetic testing. Despite that he meets criteria, he may still have an out of pocket cost. We discussed that if his out of pocket cost for testing is over $100, the laboratory will call and confirm whether he wants to proceed with testing.  If the out of pocket cost of testing is less than $100 he will be billed by the genetic testing laboratory.   PLAN: After considering the risks, benefits, and limitations, Mr. Renton provided informed consent to pursue genetic testing and the blood sample was sent to [facility] for analysis of the Multi-Cancer+RNA panel. Results should be available within approximately 2-3 weeks' time, at which point they will be disclosed by telephone to  Mr. Weekes, as will any additional recommendations warranted by these results. Mr. Campton will receive a summary of his genetic counseling visit and a copy of his results once available. This information will also be available in Epic.  \ Mr. Iannaccone's questions were answered to his satisfaction today. Our contact information was provided should additional questions or concerns arise. Thank you for the referral and allowing us to share in the care of your patient.    , MS, LCGC Genetic Counselor [email or web address] Phone: [phone]  The patient was seen for a total of 30 minutes in face-to-face genetic counseling.  Patient was seen with his sister. [employer] intern Megan Weible was also present and assisted with this case. Dr. Finnegan was available for discussion regarding this case.   _______________________________________________________________________ For Office Staff:  Number of people involved in session: 2 Was an Intern/ student involved with case: yes  

## 2021-02-03 ENCOUNTER — Ambulatory Visit: Payer: Self-pay | Admitting: Urology

## 2021-02-04 ENCOUNTER — Encounter: Payer: Self-pay | Admitting: Urology

## 2021-02-04 ENCOUNTER — Ambulatory Visit (INDEPENDENT_AMBULATORY_CARE_PROVIDER_SITE_OTHER): Payer: Medicare (Managed Care) | Admitting: Urology

## 2021-02-04 ENCOUNTER — Other Ambulatory Visit: Payer: Self-pay

## 2021-02-04 VITALS — BP 143/80 | HR 87 | Ht 65.0 in | Wt 194.0 lb

## 2021-02-04 DIAGNOSIS — C61 Malignant neoplasm of prostate: Secondary | ICD-10-CM | POA: Diagnosis not present

## 2021-02-04 DIAGNOSIS — R350 Frequency of micturition: Secondary | ICD-10-CM

## 2021-02-04 LAB — BLADDER SCAN AMB NON-IMAGING

## 2021-02-04 MED ORDER — TAMSULOSIN HCL 0.4 MG PO CAPS: 0.4000 mg | ORAL_CAPSULE | Freq: Every day | ORAL | 2 refills | Status: DC

## 2021-02-04 NOTE — Progress Notes (Signed)
   02/04/2021 12:23 PM   Eula Flax 1945-12-29 [id number]  Reason for visit: Follow up metastatic prostate cancer, urinary symptoms  HPI: 75 year old comorbid male here with his sister today for follow-up of the above issues.  She helps him with most of his medical decisions.  He presented with an elevated PSA of 30 and an abnormal DRE and underwent a prostate biopsy in January 2022 that showed a 56 g prostate and all cores were positive for high risk prostate cancer, primarily Gleason score 5+ 4 = 9 with max core involvement of 100%.  Bone scan showed foci of abnormal uptake in the mid thoracic and upper lumbar spine consistent with metastatic disease.  He is managed by oncology with ADT, darolutamide, and completed radiation to the prostate and pelvic lymph nodes.  Most recent PSA on 01/01/2021 was 0.01.  He has upcoming repeat imaging scheduled for 02/12/2021 with a CT chest abdomen and pelvis.  At baseline he has some urinary frequency during the day that is minimally bothersome.  Since completing radiation he has noticed some increase in his frequency overnight with nocturia 3-4 times per night.  He is interested in trying medications to help with this.  He drinks primarily water during the day.  Recommended a trial of Flomax 0.4 mg nightly, and risks and benefits were discussed.  I also discussed behavioral strategies including minimizing fluids before bedtime, and double voiding in the evening.  PVR is normal today at 20 mL.  -Trial of Flomax 0.4 mg nightly -Continue treatment for metastatic prostate cancer with medical oncology -RTC 3 months PVR and follow-up imaging, if doing well at that time likely can follow-up with urology yearly for urinary symptoms   Billey Co, MD  [place] [place] [address], [place], [place] [address] [phone]

## 2021-02-10 ENCOUNTER — Other Ambulatory Visit: Payer: Self-pay | Admitting: *Deleted

## 2021-02-10 DIAGNOSIS — C61 Malignant neoplasm of prostate: Secondary | ICD-10-CM

## 2021-02-12 ENCOUNTER — Ambulatory Visit
Admission: RE | Admit: 2021-02-12 | Discharge: 2021-02-12 | Disposition: A | Discharge status: Home / Self Care | Payer: Medicare (Managed Care) | Source: Ambulatory Visit | Attending: Oncology | Admitting: Oncology

## 2021-02-12 ENCOUNTER — Inpatient Hospital Stay: Payer: Medicare (Managed Care) | Attending: Oncology

## 2021-02-12 ENCOUNTER — Other Ambulatory Visit: Payer: Self-pay

## 2021-02-12 DIAGNOSIS — E119 Type 2 diabetes mellitus without complications: Secondary | ICD-10-CM | POA: Diagnosis not present

## 2021-02-12 DIAGNOSIS — Z87891 Personal history of nicotine dependence: Secondary | ICD-10-CM | POA: Diagnosis not present

## 2021-02-12 DIAGNOSIS — Z800 Family history of malignant neoplasm of digestive organs: Secondary | ICD-10-CM | POA: Insufficient documentation

## 2021-02-12 DIAGNOSIS — C61 Malignant neoplasm of prostate: Secondary | ICD-10-CM | POA: Insufficient documentation

## 2021-02-12 DIAGNOSIS — Z808 Family history of malignant neoplasm of other organs or systems: Secondary | ICD-10-CM | POA: Diagnosis not present

## 2021-02-12 DIAGNOSIS — I2510 Atherosclerotic heart disease of native coronary artery without angina pectoris: Secondary | ICD-10-CM | POA: Diagnosis not present

## 2021-02-12 DIAGNOSIS — I110 Hypertensive heart disease with heart failure: Secondary | ICD-10-CM | POA: Insufficient documentation

## 2021-02-12 DIAGNOSIS — D649 Anemia, unspecified: Secondary | ICD-10-CM | POA: Diagnosis not present

## 2021-02-12 DIAGNOSIS — C7951 Secondary malignant neoplasm of bone: Secondary | ICD-10-CM | POA: Diagnosis not present

## 2021-02-12 DIAGNOSIS — Z803 Family history of malignant neoplasm of breast: Secondary | ICD-10-CM | POA: Diagnosis not present

## 2021-02-12 DIAGNOSIS — G8929 Other chronic pain: Secondary | ICD-10-CM | POA: Insufficient documentation

## 2021-02-12 DIAGNOSIS — I509 Heart failure, unspecified: Secondary | ICD-10-CM | POA: Insufficient documentation

## 2021-02-12 LAB — COMPREHENSIVE METABOLIC PANEL
ALT: 14 U/L (ref 0–44)
AST: 19 U/L (ref 15–41)
Albumin: 4.4 g/dL (ref 3.5–5.0)
Alkaline Phosphatase: 53 U/L (ref 38–126)
Anion gap: 13 (ref 5–15)
BUN: 16 mg/dL (ref 8–23)
CO2: 24 mmol/L (ref 22–32)
Calcium: 9.7 mg/dL (ref 8.9–10.3)
Chloride: 96 mmol/L — ABNORMAL LOW (ref 98–111)
Creatinine, Ser: 0.7 mg/dL (ref 0.61–1.24)
GFR, Estimated: >60 mL/min (ref >60)
Glucose, Bld: 137 mg/dL — ABNORMAL HIGH (ref 70–99)
Potassium: 4.2 mmol/L (ref 3.5–5.1)
Sodium: 133 mmol/L — ABNORMAL LOW (ref 135–145)
Total Bilirubin: 0.4 mg/dL (ref 0.3–1.2)
Total Protein: 8 g/dL (ref 6.5–8.1)

## 2021-02-12 LAB — CBC WITH DIFFERENTIAL/PLATELET
Abs Immature Granulocytes: 0.01 10*3/uL (ref 0.00–0.07)
Basophils Absolute: 0 10*3/uL (ref 0.0–0.1)
Basophils Relative: 1 %
Eosinophils Absolute: 0.1 10*3/uL (ref 0.0–0.5)
Eosinophils Relative: 3 %
HCT: 39.9 % (ref 39.0–52.0)
Hemoglobin: 12.7 g/dL — ABNORMAL LOW (ref 13.0–17.0)
Immature Granulocytes: 0 %
Lymphocytes Relative: 20 %
Lymphs Abs: 0.6 10*3/uL — ABNORMAL LOW (ref 0.7–4.0)
MCH: 30.8 pg (ref 26.0–34.0)
MCHC: 31.8 g/dL (ref 30.0–36.0)
MCV: 96.6 fL (ref 80.0–100.0)
Monocytes Absolute: 0.3 10*3/uL (ref 0.1–1.0)
Monocytes Relative: 10 %
Neutro Abs: 1.9 10*3/uL (ref 1.7–7.7)
Neutrophils Relative %: 66 %
Platelets: 183 10*3/uL (ref 150–400)
RBC: 4.13 MIL/uL — ABNORMAL LOW (ref 4.22–5.81)
RDW: 13.9 % (ref 11.5–15.5)
WBC: 2.9 10*3/uL — ABNORMAL LOW (ref 4.0–10.5)
nRBC: 0 % (ref 0.0–0.2)

## 2021-02-12 LAB — PSA: Prostatic Specific Antigen: 0.02 ng/mL (ref 0.00–4.00)

## 2021-02-16 ENCOUNTER — Other Ambulatory Visit: Payer: Self-pay

## 2021-02-16 ENCOUNTER — Encounter: Payer: Self-pay | Admitting: Oncology

## 2021-02-16 ENCOUNTER — Inpatient Hospital Stay (HOSPITAL_BASED_OUTPATIENT_CLINIC_OR_DEPARTMENT_OTHER): Payer: Medicare (Managed Care) | Admitting: Oncology

## 2021-02-16 VITALS — BP 165/97 | HR 82 | Temp 96.9°F | Wt 195.0 lb

## 2021-02-16 DIAGNOSIS — Z5111 Encounter for antineoplastic chemotherapy: Secondary | ICD-10-CM

## 2021-02-16 DIAGNOSIS — C7951 Secondary malignant neoplasm of bone: Secondary | ICD-10-CM

## 2021-02-16 DIAGNOSIS — D649 Anemia, unspecified: Secondary | ICD-10-CM

## 2021-02-16 DIAGNOSIS — Z87891 Personal history of nicotine dependence: Secondary | ICD-10-CM | POA: Diagnosis not present

## 2021-02-16 DIAGNOSIS — C61 Malignant neoplasm of prostate: Secondary | ICD-10-CM | POA: Diagnosis not present

## 2021-02-16 NOTE — Progress Notes (Signed)
Hematology/Oncology progress note  [phone] [phone]   Patient Care Team: Inc, [facility] as PCP - General [name], MD as Consulting Physician (Cardiology) Alisa Graff, FNP as Nurse Practitioner (Family Medicine) [name], MD as Consulting Physician (Hematology and Oncology)  REFERRING PROVIDER: [facility], [place] COMPLAINTS/REASON FOR VISIT:  metastatic prostate cancer  HISTORY OF PRESENTING ILLNESS:   Matthew Mcgrath is a  75 y.o.  male with PMH listed below was seen in consultation at the request of  Inc, [place]  for evaluation of metastatic prostate cancer. Patient was accompanied by her sister who he currently lives with.  History was mainly obtained from sister as well as reviewing medical records.  Patient is a poor historian. Per sister, she has noticed patient having increased urgency and frequency of urination.  Patient was evaluated by urologist Dr. Diamantina Providence in January 2022.  PSA was elevated at 30.4 in January 2022 04/01/2020, prostate biopsy showed  All cores were positive for high risk prostate cancer, primarily Gleason score 5+4 = 9, with max core involvement of 100% and perineural invasion present. 04/21/2020, CT abdomen pelvis showed a sclerotic lesion in the lumbar spine concerning for probable metastatic disease.  No definitive extra skeletal metastatic disease noted elsewhere in the abdomen or pelvis.  Mild circumferential bladder wall thickening.  Aortic atherosclerosis. 04/27/2020, bone scan showed foci of abnormal uptake noted in the mid thoracic and upper lumbar spine concerning for metastatic disease.  Patient reports some lower back pain intermittently.  He currently lives with his sister.   Past medical history includes CHF, hypertension, hyper lipidemia, diabetes type 2 Per sister, family history is positive for colon cancer and another brother, multiple myeloma in the other  brother.  04/16/2020 Firmagon 240 mg loading dose #06/11/2020 patient went to [facility] for second opinion was seen by [name] who agrees with continuing ADT with switch to Eligard 45 mg.  He is due to proceed with the treatments today.  Communicated with pace program [name], patient will receive treatment at [place]. [name] also recommends to start darolutamide and authorization process has been initiated at [facility]. #07/06/2020 started darolutamide 600 mg twice daily # definitive treatment for prostate and pelvic node- Discussed with [facility] [name] and I appreciate his expert opinion of darolutamide concurrent use with RT and ADT # 10/19/2020 finished definitive RT. To prostate cancer and pelvic lymph node treatments.  INTERVAL HISTORY Matthew Mcgrath is a 75 y.o. male who has above history reviewed by me today presents for follow up visit for management of metastatic prostate cancer Patient was accompanied by his sister.  Early November 2022, Eligard injections via primary care provider's office. Patient is on darolutamide 600 mg twice daily and tolerates well. He has no new complaints.  Chronic back pain unchanged. Review of Systems  Constitutional:  Positive for fatigue. Negative for appetite change, chills, fever and unexpected weight change.  HENT:   Negative for hearing loss and voice change.   Eyes:  Negative for eye problems and icterus.  Respiratory:  Negative for chest tightness, cough and shortness of breath.   Cardiovascular:  Negative for chest pain and leg swelling.  Gastrointestinal:  Negative for abdominal distention and abdominal pain.  Endocrine: Negative for hot flashes.  Genitourinary:  Negative for difficulty urinating, dysuria and frequency.   Musculoskeletal:  Positive for back pain. Negative for arthralgias.  Skin:  Negative for itching and rash.  Neurological:  Negative for light-headedness and numbness.  Hematological:  Negative for adenopathy. Does not bruise/bleed easily.   Psychiatric/Behavioral:  Negative for confusion.    MEDICAL HISTORY:  Past Medical History:  Diagnosis Date   Cardiomyopathy ([place])    Congestive heart failure (HCC)    Diabetes mellitus type 2, controlled ([place])    Elevated lipids    Family history of breast cancer    Family history of colon cancer    HTN (hypertension)     SURGICAL HISTORY: Past Surgical History:  Procedure Laterality Date   CARDIAC CATHETERIZATION N/A 04/14/2015   Procedure: Left Heart Cath and Coronary Angiography;  Surgeon: [name], MD;  Location: [place] [facility];  Service: Cardiovascular;  Laterality: N/A;   CATARACT EXTRACTION W/ INTRAOCULAR LENS  IMPLANT, BILATERAL Left    COLONOSCOPY WITH PROPOFOL N/A 04/08/2020   Procedure: COLONOSCOPY WITH PROPOFOL;  Surgeon: Toledo, Benay Pike, MD;  Location: [facility] ENDOSCOPY;  Service: Gastroenterology;  Laterality: N/A;   HERNIA REPAIR     IMPLANTABLE CARDIOVERTER DEFIBRILLATOR (ICD) GENERATOR CHANGE Left 07/03/2015   Procedure: ICD IMPLANT single chamber;  Surgeon: Marzetta Board, MD;  Location: [facility] ORS;  Service: Cardiovascular;  Laterality: Left;    SOCIAL HISTORY: Social History   Socioeconomic History   Marital status: Single    Spouse name: Not on file   Number of children: Not on file   Years of education: Not on file   Highest education level: Not on file  Occupational History   Not on file  Tobacco Use   Smoking status: Former    Packs/day: 0.25    Years: 30.00    Pack years: 7.50    Types: Cigarettes    Quit date: 02/11/2020    Years since quitting: 1.0   Smokeless tobacco: Never  Vaping Use   Vaping Use: Never used  Substance and Sexual Activity   Alcohol use: No   Drug use: Not Currently    Frequency: 2.0 times per week    Types: Marijuana    Comment: past use   Sexual activity: Not on file  Other Topics Concern   Not on file  Social History Narrative   Not on file   Social Determinants of Health   Financial Resource  Strain: Not on file  Food Insecurity: Not on file  Transportation Needs: Not on file  Physical Activity: Not on file  Stress: Not on file  Social Connections: Not on file  Intimate Partner Violence: Not on file    FAMILY HISTORY: Family History  Problem Relation Age of Onset   Heart failure Mother    Heart disease Mother    Alcoholism Father    Hypertension Sister    Diabetes Sister    Hypertension Brother    Diabetes Brother    Colon cancer Brother        vs prostate   Multiple myeloma Brother    Breast cancer Cousin     ALLERGIES:  has No Known Allergies.  MEDICATIONS:  Current Outpatient Medications  Medication Sig Dispense Refill   acetaminophen (TYLENOL) 650 MG CR tablet Take 650 mg by mouth every 8 (eight) hours as needed for pain.     aspirin EC 81 MG tablet @@TAKE  1 TABLET BY MOUTH ONCE DAILY.     carvedilol (COREG) 6.25 MG tablet Take 6.25 mg by mouth 2 (two) times daily.     cholecalciferol (VITAMIN D3) 25 MCG (1000 UNIT) tablet Take 1,000 Units by mouth daily.     darolutamide (NUBEQA) 300 MG tablet  Take 300 mg by mouth 2 (two) times daily with a meal.     Docosahexaenoic Acid (DHA OMEGA 3 PO) 1,000 mg.     furosemide (LASIX) 40 MG tablet Take 40 mg by mouth daily.     glipiZIDE (GLUCOTROL) 5 MG tablet Take by mouth daily before breakfast.     glucose blood (TRUE METRIX BLOOD GLUCOSE TEST) test strip 1 each by Other route as needed for other. Use as instructed     LIDODERM 5 % Apply 1 patch to skin every morning as needed for L lower back pain. Remove after 12 hours. Call for refills     lisinopril (ZESTRIL) 10 MG tablet Take 10 mg by mouth daily.     magnesium oxide (MAG-OX) 400 MG tablet Take 400 mg by mouth 2 (two) times daily.     metFORMIN (GLUCOPHAGE) 500 MG tablet Take by mouth 2 (two) times daily with a meal.     MIRALAX 17 GM/SCOOP powder Take 17 g by mouth daily.     Multiple Vitamin (MULTIVITAMIN ADULT PO) SMARTSIG:1 Tablet(s) By Mouth Daily      Omega-3 Fatty Acids (FISH OIL PO) Take by mouth.     potassium chloride (KLOR-CON) 10 MEQ tablet Take 10 mEq by mouth daily.     simvastatin (ZOCOR) 40 MG tablet Take 40 mg by mouth daily at 6 PM.      spironolactone (ALDACTONE) 25 MG tablet Take 25 mg by mouth daily.     tamsulosin (FLOMAX) 0.4 MG CAPS capsule Take 1 capsule (0.4 mg total) by mouth daily. 30 capsule 2   TRUEplus Lancets 28G MISC by Does not apply route.     No current facility-administered medications for this visit.     PHYSICAL EXAMINATION: ECOG PERFORMANCE STATUS: 1 - Symptomatic but completely ambulatory Vitals:   02/16/21 1016  BP: (!) 165/97  Pulse: 82  Temp: (!) 96.9 F (36.1 C)   Filed Weights   02/16/21 1016  Weight: 195 lb (88.5 kg)    Physical Exam Constitutional:      General: He is not in acute distress. HENT:     Head: Normocephalic and atraumatic.  Eyes:     General: No scleral icterus. Cardiovascular:     Rate and Rhythm: Normal rate and regular rhythm.     Heart sounds: Normal heart sounds.  Pulmonary:     Effort: Pulmonary effort is normal. No respiratory distress.     Breath sounds: No wheezing.  Abdominal:     General: Bowel sounds are normal. There is no distension.     Palpations: Abdomen is soft.  Musculoskeletal:        General: No deformity. Normal range of motion.     Cervical back: Normal range of motion and neck supple.  Skin:    General: Skin is warm and dry.     Findings: No erythema or rash.  Neurological:     Mental Status: He is alert and oriented to person, place, and time. Mental status is at baseline.     Cranial Nerves: No cranial nerve deficit.     Coordination: Coordination normal.  Psychiatric:        Mood and Affect: Mood normal.    LABORATORY DATA:  I have reviewed the data as listed Lab Results  Component Value Date   WBC 2.9 (L) 02/12/2021   HGB 12.7 (L) 02/12/2021   HCT 39.9 02/12/2021   MCV 96.6 02/12/2021   PLT 183 02/12/2021   Recent  Labs    11/23/20 0953 01/01/21 1015 02/12/21 0838  NA 136 134* 133*  K 4.4 4.4 4.2  CL 105 101 96*  CO2 23 23 24   GLUCOSE 243* 213* 137*  BUN 24* 27* 16  CREATININE 0.87 0.76 0.70  CALCIUM 9.1 9.5 9.7  GFRNONAA >60 >60 >60  PROT 7.3 7.9 8.0  ALBUMIN 3.7 4.3 4.4  AST 15 16 19   ALT 14 13 14   ALKPHOS 75 59 53  BILITOT 0.5 0.5 0.4    Iron/TIBC/Ferritin/ %Sat    Component Value Date/Time   IRON 82 11/23/2020 0953   TIBC 335 11/23/2020 0953   FERRITIN 221 11/23/2020 0953   IRONPCTSAT 25 11/23/2020 0953     Tests done at [facility] 06/10/20  Tests: (1) Comp. Metabolic Panel (14) (034742)    Glucose              [H]  220 mg/dL                   65-99    BUN                       18 mg/dL                    8-27    Creatinine                0.84 mg/dL                  0.76-1.27    eGFR                      93 mL/min/1.73              >59    BUN/Creatinine Ratio      21                          10-24    Sodium                    136 mmol/L                  134-144    Potassium                 4.9 mmol/L                  3.5-5.2    Chloride                  98 mmol/L                   96-106   Carbon Dioxide, Total                         [L]  19 mmol/L                   20-29    Calcium                   9.8 mg/dL                   8.6-10.2    Protein, Total            7.0 g/dL                    6.0-8.5  Albumin                   4.4 g/dL                    3.7-4.7    Globulin, Total           2.6 g/dL                    1.5-4.5    A/G Ratio                 1.7                         1.2-2.2    Bilirubin, Total          0.4 mg/dL                   0.0-1.2    Alkaline Phosphatase      65 IU/L                     44-121    AST (SGOT)                15 IU/L                     0-40    ALT (SGPT)                13 IU/L                     0-44   Tests: (1) CBC With Differential/Platelet (005009)    WBC                       4.9 x10E3/uL                3.4-10.8    RBC                   [L]  4.12 x10E6/uL               4.14-5.80    Hemoglobin           [L]  12.5 g/dL                   13.0-17.7    Hematocrit                38.7 %                      37.5-51.0    MCV                       94 fL                       79-97    MCH                       30.3 pg                     26.6-33.0    MCHC                      32.3 g/dL                   31.5-35.7  RDW                       11.6 %                      11.6-15.4    Platelets                 200 x10E3/uL                150-450    Neutrophils               55 %                        Not Estab.    Lymphs                    32 %                        Not Estab.    Monocytes                 9 %                         Not Estab.    Eos                       3 %                         Not Estab.    Basos                     1 %                         Not Estab.   Neutrophils (Absolute)                              2.8 x10E3/uL                1.4-7.0    Lymphs (Absolute)         1.6 x10E3/uL                0.7-3.1    Monocytes(Absolute)       0.4 x10E3/uL                0.1-0.9    Eos (Absolute)            0.1 x10E3/uL                0.0-0.4    Baso (Absolute)           0.0 x10E3/uL                0.0-0.2   Immature Granulocytes                              0 %                         Not Estab.    Immature Grans (Abs)      0.0 x10E3/uL  0.0-0.1    PSA 4.8   RADIOGRAPHIC STUDIES: I have personally reviewed the radiological images as listed and agreed with the findings in the report. VAS [place] AAA DUPLEX  Result Date: 12/04/2020 ABDOMINAL AORTA STUDY Patient Name:  BASEL DEFALCO  Date of Exam:   11/26/2020 Medical Rec #: [id number]       Accession #:    [id number] Date of Birth: 01-07-1946       Patient Gender: M Patient Age:   75 years Exam Location:  [facility] Procedure:      VAS [place] AAA DUPLEX Referring Phys: Leotis Pain  --------------------------------------------------------------------------------  Indications: Aneurysmal Iliac Arteries  Comparison Study: 09/12/2019 Performing Technologist: Almira Coaster RVS  Examination Guidelines: A complete evaluation includes B-mode imaging, spectral Doppler, color Doppler, and power Doppler as needed of all accessible portions of each vessel. Bilateral testing is considered an integral part of a complete examination. Limited examinations for reoccurring indications may be performed as noted.  Abdominal Aorta Findings: +-----------+-------+----------+----------+----------+--------+--------+  Location    AP (cm) Trans (cm) PSV (cm/s) Waveform   Thrombus Comments  +-----------+-------+----------+----------+----------+--------+--------+  Proximal    2.43    2.54       79         monophasic                    +-----------+-------+----------+----------+----------+--------+--------+  Mid         2.20    2.43       82         monophasic                    +-----------+-------+----------+----------+----------+--------+--------+  Distal      2.48    2.70       108        monophasic                    +-----------+-------+----------+----------+----------+--------+--------+  RT CIA Prox 2.4     2.3        54         monophasic                    +-----------+-------+----------+----------+----------+--------+--------+  LT CIA Prox 1.3     1.3        67         monophasic                    +-----------+-------+----------+----------+----------+--------+--------+  Summary: Abdominal Aorta: There is evidence of abnormal dilatation of the distal Abdominal aorta. There is evidence of abnormal dilation of the Right Common Iliac artery. The largest aortic measurement is 2.7 cm. In comparison to previous study ; The Right CIA appears to be increased in Diameter from 1.9cms to 2.4cms. The Left CIA appears to be decreased from 1.5cms to 1.3cms. The largest aortic diameter has decreased compared to prior exam.  Previous diameter measurement was 3.0 cm obtained on 09/12/2019.  *See table(s) above for measurements and observations.  Electronically signed by Leotis Pain MD on 12/04/2020 at 8:53:10 AM.    Final    CT CHEST ABDOMEN PELVIS WO CONTRAST  Result Date: 02/13/2021 CLINICAL DATA:  Metastatic prostate cancer restaging EXAM: CT CHEST, ABDOMEN AND PELVIS WITHOUT CONTRAST TECHNIQUE: Multidetector CT imaging of the chest, abdomen and pelvis was performed following the standard protocol without IV contrast. Oral enteric contrast was administered. COMPARISON:  CT abdomen pelvis, 04/21/2020 FINDINGS: CT CHEST FINDINGS  Cardiovascular: Left chest single lead pacer defibrillator. Normal heart size. Extensive three-vessel coronary artery calcifications and stents. Small, unchanged pericardial effusion. Enlargement of the main pulmonary artery measuring up to 3.9 cm in caliber. Mediastinum/Nodes: No enlarged mediastinal, hilar, or axillary lymph nodes. Thyroid gland, trachea, and esophagus demonstrate no significant findings. Lungs/Pleura: Criss Rosales appearing, predominantly bandlike scarring of the bilateral lung bases. No pleural effusion or pneumothorax. Musculoskeletal: No chest wall mass. Focally severe disc degenerative disease versus osseous metastatic involvement of T6-T7 (series 6, image 104). CT ABDOMEN PELVIS FINDINGS Hepatobiliary: No solid liver abnormality is seen. No gallstones, gallbladder wall thickening, or biliary dilatation. Pancreas: Unremarkable. No pancreatic ductal dilatation or surrounding inflammatory changes. Spleen: Normal in size without significant abnormality. Adrenals/Urinary Tract: Adrenal glands are unremarkable. Kidneys are normal, without renal calculi, solid lesion, or hydronephrosis. Thickening of the urinary bladder wall. Stomach/Bowel: Stomach is within normal limits. Incidental diverticulum of the transverse portion of the duodenum. Appendix appears normal. No evidence of bowel wall  thickening, distention, or inflammatory changes. Vascular/Lymphatic: Aortic atherosclerosis. No enlarged abdominal or pelvic lymph nodes. Reproductive: Mild prostatomegaly. Other: No abdominal wall hernia or abnormality. No abdominopelvic ascites. Musculoskeletal: Slight interval increase in sclerosis of osseous metastases involving the lumbar spine, for example the L2 vertebral body (series 6, image 107). IMPRESSION: 1. Slight interval increase in sclerosis of previously imaged osseous metastases involving the lumbar spine, most consistent with post treatment sclerosis of osseous metastatic disease. 2. Focally severe disc degenerative disease and sclerosis versus sclerotic osseous metastatic involvement of T6-T7, not previously imaged by CT. Attention on follow-up. 3. No noncontrast evidence of lymphadenopathy or soft tissue metastatic disease in the chest, abdomen, or pelvis. 4. Mild prostatomegaly. Thickening of the urinary bladder wall, likely due to chronic outlet obstruction. 5. Enlargement of the main pulmonary artery, as can be seen in pulmonary hypertension. 6. Coronary artery disease. Aortic Atherosclerosis (ICD10-I70.0). Electronically Signed   By: Delanna Ahmadi M.D.   On: 02/13/2021 11:24      ASSESSMENT & PLAN:  1. Prostate cancer ([place])   2. Encounter for antineoplastic chemotherapy   3. Metastatic cancer to bone [facility])    Cancer Staging  Prostate cancer [facility]) Staging form: Prostate, AJCC 8th Edition - Clinical stage from 05/11/2020: Stage IVB (cTX, cN0, cM1, PSA: 30) - Signed by [name], MD on 05/11/2020   #Metastatic prostate cancer to bone, castration sensitive Check CBC CMP PSA. -Labs are reviewed.  PSA has decreased to 10/07/2020 0.02 Eligard 45 mg Q6 months with his PCP at [facility] reviewed and discussed with patient.  Continue darolutamide 600 mg twice daily. 02/13/2021, CT chest abdomen pelvis without contrast showed slight interval increase in sclerosis of previously osseous  metastasis involving lumbar spine, most consistent with posttreatment sclerosis.  Focally severe disc degeneration and sclerosis versus sclerotic osseous metastatic of T6 and 7.  Attention on follow-up.  No noncontrast evidence of lymphadenopathy or soft tissue metastatic disease in chest abdomen pelvis.  Prostamegaly.  Enlargement of main pulmonary artery.  Coronary artery disease.  Continue darolutamide.  600 mg twice daily I will obtain bone scan.  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 TMB 3.1 mut/Mb, MS stable, PD-L1 1%  Consider possible rucaparib/olaparib in subsequent lines of treatment Patient has established with genetic counselor.  Genetic testing pending. .Marland Kitchen #Normocytic anemia, hemoglobin improved to 12.7.  . Supportive care measures are necessary for patient well-being and will be provided as necessary. We spent sufficient time to discuss many aspect  of care, questions were answered to patient's satisfaction.   All questions were answered. The patient knows to call the clinic with any problems questions or concerns. cc  Inc, [place]    Return of visit: 6 weeks    [name], MD, PhD 02/16/2021

## 2021-02-22 ENCOUNTER — Ambulatory Visit: Payer: Self-pay | Admitting: Licensed Clinical Social Worker

## 2021-02-22 ENCOUNTER — Telehealth: Payer: Self-pay | Admitting: Licensed Clinical Social Worker

## 2021-02-22 ENCOUNTER — Encounter: Payer: Self-pay | Admitting: Licensed Clinical Social Worker

## 2021-02-22 DIAGNOSIS — Z1379 Encounter for other screening for genetic and chromosomal anomalies: Secondary | ICD-10-CM

## 2021-02-22 DIAGNOSIS — Z803 Family history of malignant neoplasm of breast: Secondary | ICD-10-CM

## 2021-02-22 DIAGNOSIS — C61 Malignant neoplasm of prostate: Secondary | ICD-10-CM

## 2021-02-22 DIAGNOSIS — Z1509 Genetic susceptibility to other malignant neoplasm: Secondary | ICD-10-CM | POA: Insufficient documentation

## 2021-02-22 DIAGNOSIS — Z800 Family history of malignant neoplasm of digestive organs: Secondary | ICD-10-CM

## 2021-02-22 NOTE — Progress Notes (Signed)
HPI:  Mr. Axelson was previously seen in the [place] clinic due to a personal and family history of cancer and concerns regarding a hereditary predisposition to cancer. Please refer to our prior cancer genetics clinic note for more information regarding our discussion, assessment and recommendations, at the time. Mr. Cutting recent genetic test results were disclosed to him, as were recommendations warranted by these results. These results and recommendations are discussed in more detail below.  CANCER HISTORY:  Oncology History  Prostate cancer ([place])  05/11/2020 Initial Diagnosis   Prostate cancer ([place])   05/11/2020 Cancer Staging   Staging form: Prostate, AJCC 8th Edition - Clinical stage from 05/11/2020: Stage IVB (cTX, cN0, cM1, PSA: 30) - Signed by [name], MD on 05/11/2020 Prostate specific antigen (PSA) range: 20 or greater      FAMILY HISTORY:  We obtained a detailed, 4-generation family history.  Significant diagnoses are listed below: Family History  Problem Relation Age of Onset   Heart failure Mother    Heart disease Mother    Alcoholism Father    Hypertension Sister    Diabetes Sister    Hypertension Brother    Diabetes Brother    Colon cancer Brother        vs prostate   Multiple myeloma Brother    Breast cancer Cousin    Mr. Diemer does not have children. He has a fraternal twin brother, 3 other brothers, 4 sisters, and 1 maternal half sister. One of his brothers had prostate or colon cancer and died at 71. Another brother had multiple myeloma and died at 16.    Mr. Andrepont mother died at 18, no cancer history. Patient had 10 maternal aunts/uncles, no known cancers. A maternal cousin had breast cancer over age 43, and another cousin had cancer, unknown type, over age 3. Maternal grandmother died at 44, grandfather died at 43.   Mr. Kalmbach father died at 42 of a heart attack. Patient had 1 full paternal uncles and several half uncles. One of these half  uncles had an unknown cancer. Two cousins had unknown cancers. No information about paternal grandparents.   Mr. Beale is unaware of previous family history of genetic testing for hereditary cancer risks. Patient's maternal ancestors are of [place] American and Native American descent, and paternal ancestors are of African American descent. There is no reported Ashkenazi Jewish ancestry. There is no known consanguinity.     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 The remainder of testing was negative/normal.   The Multi-Cancer Panel + RNA offered by [facility] includes sequencing and/or deletion duplication testing of the following 84 genes: AIP, ALK, APC, ATM, AXIN2,BAP1,  BARD1, BLM, BMPR1A, BRCA1, BRCA2, BRIP1, CASR, CDC73, CDH1, CDK4, CDKN1B, CDKN1C, CDKN2A (p14ARF), CDKN2A (p16INK4a), CEBPA, CHEK2, CTNNA1, DICER1, DIS3L2, EGFR (c.2369C>T, p.Thr790Met variant only), EPCAM (Deletion/duplication testing only), FH, FLCN, GATA2, GPC3, GREM1 (Promoter region deletion/duplication testing only), HOXB13 (c.251G>A, p.Gly84Glu), HRAS, KIT, MAX, MEN1, MET, MITF (c.952G>A, p.Glu318Lys variant only), MLH1, MSH2, MSH3, MSH6, MUTYH, NBN, NF1, NF2, NTHL1, PALB2, PDGFRA, PHOX2B, PMS2, POLD1, POLE, POT1, PRKAR1A, PTCH1, PTEN, RAD50, RAD51C, RAD51D, RB1, RECQL4, RET, RUNX1, SDHAF2, SDHA (sequence changes only), SDHB, SDHC, SDHD, SMAD4, SMARCA4, SMARCB1, SMARCE1, STK11, SUFU, TERC, TERT, TMEM127, TP53, TSC1, TSC2, VHL, WRN and WT1.  The test report has been scanned into EPIC and is located under the Molecular Pathology section of the Results Review  tab.  A portion of the result report is included below for reference.       DISCUSSION: BRCA2   We discussed the cancers, inheritance, management asscociated with BRCA2 and the importance of telling family members about this result.    HBOC syndrome is characterized by an increased lifetime risk for generally adult-onset cancers including breast, contralateral breast, male breast, ovarian, prostate and pancreatic.   Cancers associated with BRCA2:  -Breast cancer, up to a 84% risk (PMID: 1027253 ) -Ovarian cancer, up to a 27% risk (PMID: 6644034) -Pancreatic cancer, 2-7% (PMID: 74259563, 87564332, 95188416, 60630160) -Prostate cancer, elevated (10932355, 73220254) -Melanoma, elevated (PMID: 27062376, 28315176)  Inheritance Hereditary predisposition to cancer due to pathogenic variants in the BRCA2 gene has autosomal dominant inheritance. This means that an individual with a pathogenic variant has a 50% chance of passing the condition on to his/her offspring. Most cases are inherited from a parent, but some cases may occur spontaneously (i.e., an individual with a pathogenic variant has parents who do not have it). Identification of a pathogenic variant allows for the recognition of at-risk relatives who can pursue testing for the familial variant.   Individuals with a single pathogenic BRCA2 variant are also carriers of autosomal recessive Fanconi anemia. Fanconi anemia is characterized by bone marrow failure with variable additional anomalies, which often include short stature, abnormal skin pigmentation, abnormal thumbs, malformations of the skeletal and central nervous systems, and developmental delay (PMID: 1607371, 06269485). Risk of leukemia and early-onset solid tumors is significantly elevated with this disorder (PMID: 46270350, 09381829, 93716967). For there to be a risk of Fanconi anemia in offspring, both the patient and their partner would each have to carry a pathogenic variant in BRCA2; in this case, the risk to have an affected child is 25%.   Management:   Male breast cancer -Breast self-exam training and education starting at 41 -Annual clinical breast exam starting at 55  Prostate cancer - Prostate cancer  screening starting at age 61   Pancreatic cancer -For individuals with a pathogenic/likely pathogenic variant in one of the pancreatic cancer susceptibility genes: -Consider pancreatic cancer screening beginning at age 29 (or 41 years younger than earliest exocrine pancreatic cancer diagnosis in the family, whichever is earlier), for individuals with exocrine pancreatic cancer in 1 or more first or second degree relatives from the same side of (or presumed to be from same side of) family as the identified pathogenic/likely pathogenic variant -The panel does not currently recommend pancreatic cancer screening for carrier of mutations in genes other than STK11 and CDKN2A in the absence of a close family history of exocrine pancreatic cancer   Melanoma -There are no specific NCCN guidelines regarding melanoma screening for individuals with BRCA mutations.  -However, sun protection is recommended, and routine skin exams by a dermatologist can be considered.    Male breast cancer -clinical breast exams every 6-12 months beginning at 52 -age 58-29: annual breast MRI screening with contrast -age 53-75: annual mammogram with consideration of tomosynthesis and breast MRI screening with contrast -For women treated for breast cancer, screening of remaining breast tissue with annual mammography and breast MRI should continue (if they have not had bilateral mastectomy) -Consider option of risk-reducing mastectomy   Ovarian cancer -Recommend risk-reducing salpingo-oopherectomy (RRSO) typically between the ages of 61-40 and upon completion of childbearing (BRCA2: Can delay until 88-45) -For patients who do not elect RRSO, transvaginal ultrasound combined with serum CA-125 for ovarian cancer screening has not been shown to be  sufficiently sensitive or specific as to support a positive recommendation, but, although of uncertain benefit, may be considered at clinician's discretion starting at age 3-35 years    These guidelines are based on current NCCN guidelines (NCCN v.1.2022).  These guidelines are subject to change and continually updated and should be directly referenced for future medical management.      FAMILY MEMBERS: It is important that all of Mr. Lundberg relatives (both men and women) know of the presence of this gene mutation. Site-specific genetic testing can sort out who in the family is at risk and who is not.   Mr. Pichardo siblings have a 50% chance to have inherited this mutation. We recommend they have genetic testing for this same mutation, as identifying the presence of this mutation would allow them to also take advantage of risk-reducing measures.   PLAN:   1. These results will be made available to his oncologist, Dr. Tasia Catchings. He would like Dr. Darreld Mclean to follow him for this indication.   2. Mr. Heroux plans to discuss these results with her family and will reach out to [place] if we can be of any assistance in coordinating genetic testing for any of her relatives.    SUPPORT AND RESOURCES: If Mr. Heslop is interested in BRCA-specific information and support, there are two groups, Facing Our Risk (www.facingourrisk.com) and Bright Pink (www.brightpink.org) which some people have found useful. They provide opportunities to speak with other individuals from high-risk families. To locate genetic counselors in other cities, visit the website of the [employer] (Artist[employer].se) and Secretary/administrator for a Social worker by zip code.  We encouraged Mr. Ducat to remain in contact with [place] on an annual basis so we can update his personal and family histories, and let him know of advances in cancer genetics that may benefit the family. Our contact number was provided. Mr. Tallarico questions were answered to his satisfaction today, and he knows he is welcome to call anytime with additional questions.   Faith Rogue, MS, [facility] Genetic Counselor [email or web address] Phone:  [phone]

## 2021-02-22 NOTE — Telephone Encounter (Signed)
Disclosed BRCA2 positive result. Offered to have patient and his sister Holley Raring come in and discuss. They would like to look over the report and think about it first. We discussed the result noting that first degree relatives have a 50% chance to also have this mutation.

## 2021-03-04 ENCOUNTER — Ambulatory Visit: Payer: Medicare (Managed Care) | Admitting: Radiation Oncology

## 2021-03-11 ENCOUNTER — Ambulatory Visit
Admission: RE | Admit: 2021-03-11 | Discharge: 2021-03-11 | Disposition: A | Discharge status: Home / Self Care | Payer: Medicare (Managed Care) | Source: Ambulatory Visit | Attending: Oncology | Admitting: Oncology

## 2021-03-11 ENCOUNTER — Other Ambulatory Visit: Payer: Self-pay

## 2021-03-11 DIAGNOSIS — C61 Malignant neoplasm of prostate: Secondary | ICD-10-CM | POA: Insufficient documentation

## 2021-03-11 MED ORDER — TECHNETIUM TC 99M MEDRONATE IV KIT
20.0000 | PACK | Freq: Once | INTRAVENOUS | Status: AC | PRN
  Administered 2021-03-11: 20.52 via INTRAVENOUS

## 2021-03-16 ENCOUNTER — Ambulatory Visit
Admission: RE | Admit: 2021-03-16 | Discharge: 2021-03-16 | Disposition: A | Discharge status: Home / Self Care | Payer: Medicare (Managed Care) | Source: Ambulatory Visit | Attending: Radiation Oncology | Admitting: Radiation Oncology

## 2021-03-16 ENCOUNTER — Other Ambulatory Visit: Payer: Self-pay

## 2021-03-16 ENCOUNTER — Encounter: Payer: Self-pay | Admitting: Radiation Oncology

## 2021-03-16 VITALS — BP 138/90 | HR 91 | Temp 95.0°F | Resp 16 | Wt 196.5 lb

## 2021-03-16 DIAGNOSIS — C775 Secondary and unspecified malignant neoplasm of intrapelvic lymph nodes: Secondary | ICD-10-CM | POA: Diagnosis not present

## 2021-03-16 DIAGNOSIS — C7951 Secondary malignant neoplasm of bone: Secondary | ICD-10-CM | POA: Insufficient documentation

## 2021-03-16 DIAGNOSIS — C61 Malignant neoplasm of prostate: Secondary | ICD-10-CM | POA: Diagnosis not present

## 2021-03-16 DIAGNOSIS — M5134 Other intervertebral disc degeneration, thoracic region: Secondary | ICD-10-CM | POA: Insufficient documentation

## 2021-03-16 DIAGNOSIS — Z923 Personal history of irradiation: Secondary | ICD-10-CM | POA: Diagnosis not present

## 2021-03-16 NOTE — Progress Notes (Signed)
Radiation Oncology Follow up Note  Name: Matthew Mcgrath   Date:   03/16/2021 MRN:  [id number] DOB: [birth year]    This 76 y.o. 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  His most recent PSA is 0.02.  He is currently on darolutamide.  600 mg twice daily under medical oncology's direction.  A recent bone scan showing some increased uptake at T6 and L2 although L2's has activity decreased.  The T6 lesion corresponds to an area of degenerative disc disease.  With no areas showing definitive bone metastasis active at this time.  COMPLICATIONS OF TREATMENT: none  FOLLOW UP COMPLIANCE: keeps appointments   PHYSICAL EXAM:  BP 138/90 (BP Location: Left Arm, Patient Position: Sitting)    Pulse 91    Temp (!) 95 F (35 C) (Tympanic)    Resp 16    Wt 196 lb 8 oz (89.1 kg)    BMI 32.70 kg/m  Well-developed well-nourished patient in NAD. HEENT reveals PERLA, EOMI, discs not visualized.  Oral cavity is clear. No oral mucosal lesions are identified. Neck is clear without evidence of cervical or supraclavicular adenopathy. Lungs are clear to A&P. Cardiac examination is essentially unremarkable with regular rate and rhythm without murmur rub or thrill. Abdomen is benign with no organomegaly or masses noted. Motor sensory and DTR levels are equal and symmetric in the upper and lower extremities. Cranial nerves II  through XII are grossly intact. Proprioception is intact. No peripheral adenopathy or edema is identified. No motor or sensory levels are noted. Crude visual fields are within normal range.  RADIOLOGY RESULTS: Bone scan reviewed compatible with above-stated findings  PLAN: Present time patient is doing well he continues treatment through medical oncology.  Bone scan appears stable if not improved.  Pleased with his PSA readings at this time.  Of asked to see him back in 6 months for follow-up.  PSAs will continue to be ordered through medical oncology.  Patient and family know to call with any concerns.  I would like to take this opportunity to thank you for allowing me to participate in the care of your patient.Noreene Filbert, MD

## 2021-03-30 ENCOUNTER — Inpatient Hospital Stay: Payer: Medicare (Managed Care) | Attending: Oncology

## 2021-03-30 ENCOUNTER — Other Ambulatory Visit: Payer: Self-pay

## 2021-03-30 DIAGNOSIS — I509 Heart failure, unspecified: Secondary | ICD-10-CM | POA: Diagnosis not present

## 2021-03-30 DIAGNOSIS — C61 Malignant neoplasm of prostate: Secondary | ICD-10-CM | POA: Insufficient documentation

## 2021-03-30 DIAGNOSIS — Z803 Family history of malignant neoplasm of breast: Secondary | ICD-10-CM | POA: Insufficient documentation

## 2021-03-30 DIAGNOSIS — Z1501 Genetic susceptibility to malignant neoplasm of breast: Secondary | ICD-10-CM | POA: Insufficient documentation

## 2021-03-30 DIAGNOSIS — Z800 Family history of malignant neoplasm of digestive organs: Secondary | ICD-10-CM | POA: Diagnosis not present

## 2021-03-30 DIAGNOSIS — D649 Anemia, unspecified: Secondary | ICD-10-CM | POA: Insufficient documentation

## 2021-03-30 DIAGNOSIS — Z87891 Personal history of nicotine dependence: Secondary | ICD-10-CM | POA: Diagnosis not present

## 2021-03-30 DIAGNOSIS — C7951 Secondary malignant neoplasm of bone: Secondary | ICD-10-CM | POA: Diagnosis not present

## 2021-03-30 DIAGNOSIS — E119 Type 2 diabetes mellitus without complications: Secondary | ICD-10-CM | POA: Diagnosis not present

## 2021-03-30 DIAGNOSIS — I110 Hypertensive heart disease with heart failure: Secondary | ICD-10-CM | POA: Insufficient documentation

## 2021-03-30 LAB — COMPREHENSIVE METABOLIC PANEL
ALT: 12 U/L (ref 0–44)
AST: 17 U/L (ref 15–41)
Albumin: 3.9 g/dL (ref 3.5–5.0)
Alkaline Phosphatase: 49 U/L (ref 38–126)
Anion gap: 10 (ref 5–15)
BUN: 22 mg/dL (ref 8–23)
CO2: 23 mmol/L (ref 22–32)
Calcium: 9.4 mg/dL (ref 8.9–10.3)
Chloride: 103 mmol/L (ref 98–111)
Creatinine, Ser: 0.65 mg/dL (ref 0.61–1.24)
GFR, Estimated: >60 mL/min (ref >60)
Glucose, Bld: 167 mg/dL — ABNORMAL HIGH (ref 70–99)
Potassium: 4.2 mmol/L (ref 3.5–5.1)
Sodium: 136 mmol/L (ref 135–145)
Total Bilirubin: 0.5 mg/dL (ref 0.3–1.2)
Total Protein: 7.6 g/dL (ref 6.5–8.1)

## 2021-03-30 LAB — CBC WITH DIFFERENTIAL/PLATELET
Abs Immature Granulocytes: 0.02 10*3/uL (ref 0.00–0.07)
Basophils Absolute: 0 10*3/uL (ref 0.0–0.1)
Basophils Relative: 0 %
Eosinophils Absolute: 0.1 10*3/uL (ref 0.0–0.5)
Eosinophils Relative: 2 %
HCT: 33.9 % — ABNORMAL LOW (ref 39.0–52.0)
Hemoglobin: 10.8 g/dL — ABNORMAL LOW (ref 13.0–17.0)
Immature Granulocytes: 1 %
Lymphocytes Relative: 17 %
Lymphs Abs: 0.7 10*3/uL (ref 0.7–4.0)
MCH: 30.6 pg (ref 26.0–34.0)
MCHC: 31.9 g/dL (ref 30.0–36.0)
MCV: 96 fL (ref 80.0–100.0)
Monocytes Absolute: 0.3 10*3/uL (ref 0.1–1.0)
Monocytes Relative: 8 %
Neutro Abs: 2.9 10*3/uL (ref 1.7–7.7)
Neutrophils Relative %: 72 %
Platelets: 205 10*3/uL (ref 150–400)
RBC: 3.53 MIL/uL — ABNORMAL LOW (ref 4.22–5.81)
RDW: 14.9 % (ref 11.5–15.5)
WBC: 4 10*3/uL (ref 4.0–10.5)
nRBC: 0 % (ref 0.0–0.2)

## 2021-03-30 LAB — PSA: Prostatic Specific Antigen: 0.03 ng/mL (ref 0.00–4.00)

## 2021-04-01 ENCOUNTER — Other Ambulatory Visit: Payer: Self-pay

## 2021-04-01 ENCOUNTER — Encounter: Payer: Self-pay | Admitting: Oncology

## 2021-04-01 ENCOUNTER — Inpatient Hospital Stay (HOSPITAL_BASED_OUTPATIENT_CLINIC_OR_DEPARTMENT_OTHER): Payer: Medicare (Managed Care) | Admitting: Oncology

## 2021-04-01 VITALS — BP 132/88 | HR 85 | Temp 97.2°F | Resp 16 | Wt 197.0 lb

## 2021-04-01 DIAGNOSIS — Z1501 Genetic susceptibility to malignant neoplasm of breast: Secondary | ICD-10-CM | POA: Diagnosis not present

## 2021-04-01 DIAGNOSIS — D649 Anemia, unspecified: Secondary | ICD-10-CM | POA: Diagnosis not present

## 2021-04-01 DIAGNOSIS — C61 Malignant neoplasm of prostate: Secondary | ICD-10-CM | POA: Diagnosis not present

## 2021-04-01 DIAGNOSIS — Z1509 Genetic susceptibility to other malignant neoplasm: Secondary | ICD-10-CM

## 2021-04-01 DIAGNOSIS — Z5111 Encounter for antineoplastic chemotherapy: Secondary | ICD-10-CM

## 2021-04-01 DIAGNOSIS — C7951 Secondary malignant neoplasm of bone: Secondary | ICD-10-CM | POA: Diagnosis not present

## 2021-04-01 NOTE — Progress Notes (Signed)
Pt in for follow up, states having some lower back pain. Denies any other concerns.

## 2021-04-01 NOTE — Progress Notes (Signed)
Hematology/Oncology progress note  [phone] [phone]   Patient Care Team: Inc, [facility] as PCP - General Matthew Cowman, MD as Consulting Physician (Cardiology) Matthew Graff, FNP as Nurse Practitioner (Family Medicine) Matthew Server, MD as Consulting Physician (Hematology and Oncology)  REFERRING PROVIDER: Inc, [place] COMPLAINTS/REASON FOR VISIT:  metastatic prostate cancer  HISTORY OF PRESENTING ILLNESS:   Matthew Mcgrath is a  76 y.o.  male with PMH listed below was seen in consultation at the request of  Inc, [place]  for evaluation of metastatic prostate cancer. Patient was accompanied by her sister who he currently lives with.  History was mainly obtained from sister as well as reviewing medical records.  Patient is a poor historian. Per sister, she has noticed patient having increased urgency and frequency of urination.  Patient was evaluated by urologist Matthew Mcgrath in January 2022.  PSA was elevated at 30.4 in January 2022 04/01/2020, prostate biopsy showed  All cores were positive for high risk prostate cancer, primarily Gleason score 5+4 = 9, with max core involvement of 100% and perineural invasion present. 04/21/2020, CT abdomen pelvis showed a sclerotic lesion in the lumbar spine concerning for probable metastatic disease.  No definitive extra skeletal metastatic disease noted elsewhere in the abdomen or pelvis.  Mild circumferential bladder wall thickening.  Aortic atherosclerosis. 04/27/2020, bone scan showed foci of abnormal uptake noted in the mid thoracic and upper lumbar spine concerning for metastatic disease.  Patient reports some lower back pain intermittently.  He currently lives with his sister.   Past medical history includes CHF, hypertension, hyper lipidemia, diabetes type 2 Per sister, family history is positive for colon cancer and another brother, multiple myeloma in the other  brother.  04/16/2020 Firmagon 240 mg loading dose #06/11/2020 patient went to [facility] for second opinion was seen by Matthew Mcgrath who agrees with continuing ADT with switch to Eligard 45 mg.  He is due to proceed with the treatments today.  Communicated with pace program MatthewMouw, patient will receive treatment at [place]. Matthew Mcgrath also recommends to start darolutamide and authorization process has been initiated at [facility]. #07/06/2020 started darolutamide 600 mg twice daily # definitive treatment for prostate and pelvic node- Discussed with [facility] Matthew Mcgrath and I appreciate his expert opinion of darolutamide concurrent use with RT and ADT # 10/19/2020 finished definitive RT. To prostate cancer and pelvic lymph node treatments. #02/13/2021, CT chest abdomen pelvis without contrast showed slight interval increase in sclerosis of previously osseous metastasis involving lumbar spine, most consistent with posttreatment sclerosis.  Focally severe disc degeneration and sclerosis versus sclerotic osseous metastatic of T6 and 7.  Attention on follow-up.  No noncontrast evidence of lymphadenopathy or soft tissue metastatic disease in chest abdomen pelvis.  Prostamegaly.  Enlargement of main pulmonary artery.  Coronary artery disease.  INTERVAL HISTORY Matthew Mcgrath is a 76 y.o. male who has above history reviewed by me today presents for follow up visit for management of metastatic prostate cancer Patient was accompanied by his sister.  Early November 2022, Eligard injections via primary care provider's office. Patient is on darolutamide 600 mg twice daily patient reports tolerating well.  No new complaints. Patient has genetic testing done which showed BRCA2 mutation. Review of Systems  Constitutional:  Positive for fatigue. Negative for appetite change, chills, fever and unexpected weight change.  HENT:   Negative for hearing loss and voice change.   Eyes:  Negative for eye problems and icterus.  Respiratory:  Negative for chest  tightness, cough and shortness of breath.   Cardiovascular:  Negative for chest pain and leg swelling.  Gastrointestinal:  Negative for abdominal distention and abdominal pain.  Endocrine: Negative for hot flashes.  Genitourinary:  Negative for difficulty urinating, dysuria and frequency.   Musculoskeletal:  Positive for back pain. Negative for arthralgias.  Skin:  Negative for itching and rash.  Neurological:  Negative for light-headedness and numbness.  Hematological:  Negative for adenopathy. Does not bruise/bleed easily.  Psychiatric/Behavioral:  Negative for confusion.    MEDICAL HISTORY:  Past Medical History:  Diagnosis Date   Cardiomyopathy ([place])    Congestive heart failure (HCC)    Diabetes mellitus type 2, controlled ([place])    Elevated lipids    Family history of breast cancer    Family history of colon cancer    HTN (hypertension)     SURGICAL HISTORY: Past Surgical History:  Procedure Laterality Date   CARDIAC CATHETERIZATION N/A 04/14/2015   Procedure: Left Heart Cath and Coronary Angiography;  Surgeon: Matthew Cowman, MD;  Location: [place] [facility];  Service: Cardiovascular;  Laterality: N/A;   CATARACT EXTRACTION W/ INTRAOCULAR LENS  IMPLANT, BILATERAL Left    COLONOSCOPY WITH PROPOFOL N/A 04/08/2020   Procedure: COLONOSCOPY WITH PROPOFOL;  Surgeon: Toledo, Benay Pike, MD;  Location: [facility] ENDOSCOPY;  Service: Gastroenterology;  Laterality: N/A;   HERNIA REPAIR     IMPLANTABLE CARDIOVERTER DEFIBRILLATOR (ICD) GENERATOR CHANGE Left 07/03/2015   Procedure: ICD IMPLANT single chamber;  Surgeon: Marzetta Board, MD;  Location: [facility] ORS;  Service: Cardiovascular;  Laterality: Left;    SOCIAL HISTORY: Social History   Socioeconomic History   Marital status: Single    Spouse name: Not on file   Number of children: Not on file   Years of education: Not on file   Highest education level: Not on file  Occupational History   Not on file  Tobacco Use   Smoking  status: Former    Packs/day: 0.25    Years: 30.00    Pack years: 7.50    Types: Cigarettes    Quit date: 02/11/2020    Years since quitting: 1.1   Smokeless tobacco: Never  Vaping Use   Vaping Use: Never used  Substance and Sexual Activity   Alcohol use: No   Drug use: Not Currently    Frequency: 2.0 times per week    Types: Marijuana    Comment: past use   Sexual activity: Not on file  Other Topics Concern   Not on file  Social History Narrative   Not on file   Social Determinants of Health   Financial Resource Strain: Not on file  Food Insecurity: Not on file  Transportation Needs: Not on file  Physical Activity: Not on file  Stress: Not on file  Social Connections: Not on file  Intimate Partner Violence: Not on file    FAMILY HISTORY: Family History  Problem Relation Age of Onset   Heart failure Mother    Heart disease Mother    Alcoholism Father    Hypertension Sister    Diabetes Sister    Hypertension Brother    Diabetes Brother    Colon cancer Brother        vs prostate   Multiple myeloma Brother    Breast cancer Cousin     ALLERGIES:  has No Known Allergies.  MEDICATIONS:  Current Outpatient Medications  Medication Sig Dispense Refill   acetaminophen (TYLENOL) 650 MG  CR tablet Take 650 mg by mouth every 8 (eight) hours as needed for pain.     aspirin EC 81 MG tablet @@TAKE  1 TABLET BY MOUTH ONCE DAILY.     carvedilol (COREG) 6.25 MG tablet Take 6.25 mg by mouth 2 (two) times daily.     cholecalciferol (VITAMIN D3) 25 MCG (1000 UNIT) tablet Take 1,000 Units by mouth daily.     darolutamide (NUBEQA) 300 MG tablet Take 300 mg by mouth 2 (two) times daily with a meal.     Docosahexaenoic Acid (DHA OMEGA 3 PO) 1,000 mg.     furosemide (LASIX) 40 MG tablet Take 40 mg by mouth daily.     glipiZIDE (GLUCOTROL) 5 MG tablet Take by mouth daily before breakfast.     glucose blood (TRUE METRIX BLOOD GLUCOSE TEST) test strip 1 each by Other route as needed for  other. Use as instructed     JARDIANCE 10 MG TABS tablet Take 10 mg by mouth daily.     LIDODERM 5 % Apply 1 patch to skin every morning as needed for L lower back pain. Remove after 12 hours. Call for refills     lisinopril (ZESTRIL) 10 MG tablet Take 10 mg by mouth daily.     magnesium oxide (MAG-OX) 400 MG tablet Take 400 mg by mouth 2 (two) times daily.     metFORMIN (GLUCOPHAGE) 500 MG tablet Take by mouth 2 (two) times daily with a meal.     MIRALAX 17 GM/SCOOP powder Take 17 g by mouth daily.     Multiple Vitamin (MULTIVITAMIN ADULT PO) SMARTSIG:1 Tablet(s) By Mouth Daily     Omega-3 Fatty Acids (FISH OIL PO) Take by mouth.     potassium chloride (KLOR-CON) 10 MEQ tablet Take 10 mEq by mouth daily.     simvastatin (ZOCOR) 40 MG tablet Take 40 mg by mouth daily at 6 PM.      spironolactone (ALDACTONE) 25 MG tablet Take 25 mg by mouth daily.     tamsulosin (FLOMAX) 0.4 MG CAPS capsule Take 1 capsule (0.4 mg total) by mouth daily. 30 capsule 2   TRUEplus Lancets 28G MISC by Does not apply route.     No current facility-administered medications for this visit.     PHYSICAL EXAMINATION: ECOG PERFORMANCE STATUS: 1 - Symptomatic but completely ambulatory Vitals:   04/01/21 1055  BP: 132/88  Pulse: 85  Resp: 16  Temp: (!) 97.2 F (36.2 C)  SpO2: 97%   Filed Weights   04/01/21 1055  Weight: 197 lb (89.4 kg)    Physical Exam Constitutional:      General: He is not in acute distress. HENT:     Head: Normocephalic and atraumatic.  Eyes:     General: No scleral icterus. Cardiovascular:     Rate and Rhythm: Normal rate and regular rhythm.     Heart sounds: Normal heart sounds.  Pulmonary:     Effort: Pulmonary effort is normal. No respiratory distress.     Breath sounds: No wheezing.  Abdominal:     General: Bowel sounds are normal. There is no distension.     Palpations: Abdomen is soft.  Musculoskeletal:        General: No deformity. Normal range of motion.      Cervical back: Normal range of motion and neck supple.  Skin:    General: Skin is warm and dry.     Findings: No erythema or rash.  Neurological:  Mental Status: He is alert and oriented to person, place, and time. Mental status is at baseline.     Cranial Nerves: No cranial nerve deficit.     Coordination: Coordination normal.  Psychiatric:        Mood and Affect: Mood normal.    LABORATORY DATA:  I have reviewed the data as listed Lab Results  Component Value Date   WBC 4.0 03/30/2021   HGB 10.8 (L) 03/30/2021   HCT 33.9 (L) 03/30/2021   MCV 96.0 03/30/2021   PLT 205 03/30/2021   Recent Labs    01/01/21 1015 02/12/21 0838 03/30/21 1052  NA 134* 133* 136  K 4.4 4.2 4.2  CL 101 96* 103  CO2 23 24 23   GLUCOSE 213* 137* 167*  BUN 27* 16 22  CREATININE 0.76 0.70 0.65  CALCIUM 9.5 9.7 9.4  GFRNONAA >60 >60 >60  PROT 7.9 8.0 7.6  ALBUMIN 4.3 4.4 3.9  AST 16 19 17   ALT 13 14 12   ALKPHOS 59 53 49  BILITOT 0.5 0.4 0.5    Iron/TIBC/Ferritin/ %Sat    Component Value Date/Time   IRON 82 11/23/2020 0953   TIBC 335 11/23/2020 0953   FERRITIN 221 11/23/2020 0953   IRONPCTSAT 25 11/23/2020 0953     RADIOGRAPHIC STUDIES: I have personally reviewed the radiological images as listed and agreed with the findings in the report. NM Bone Scan Whole Body  Result Date: 03/11/2021 CLINICAL DATA:  Prostate cancer, PSA 0.023 weeks ago EXAM: NUCLEAR MEDICINE WHOLE BODY BONE SCAN TECHNIQUE: Whole body anterior and posterior images were obtained approximately 3 hours after intravenous injection of radiopharmaceutical. RADIOPHARMACEUTICALS:  20.52 mCi Technetium-19mMDP IV COMPARISON:  04/27/2020 Correlation: CT chest abdomen pelvis 02/12/2021 FINDINGS: Again identified uptake at midthoracic spine at T6. New subtle uptake T8 vertebra. Decreased uptake at previously seen site in lumbar spine at approximately L2. Uptake at shoulders, sternoclavicular joints, hips, feet, typically  degenerative. Photopenic defect LEFT chest wall from ICD generator. Questionable uptake posterior RIGHT third rib versus artifact. Expected urinary tract and soft tissue distribution of tracer. IMPRESSION: Again identified sites of uptake at T6 and L2, degree of uptake at L2 diminished since previous exam. L2 uptake corresponds to sclerotic metastasis on CT. Uptake at T6 corresponds to advanced degenerative disc disease changes on CT. Questioned uptake at T8 corresponds to mild degenerative disc disease changes on CT. No definite osseous abnormalities at posterior RIGHT third rib by CT, questionable scintigraphic significance. Electronically Signed   By: MLavonia DanaM.D.   On: 03/11/2021 17:24   CT CHEST ABDOMEN PELVIS WO CONTRAST  Result Date: 02/13/2021 CLINICAL DATA:  Metastatic prostate cancer restaging EXAM: CT CHEST, ABDOMEN AND PELVIS WITHOUT CONTRAST TECHNIQUE: Multidetector CT imaging of the chest, abdomen and pelvis was performed following the standard protocol without IV contrast. Oral enteric contrast was administered. COMPARISON:  CT abdomen pelvis, 04/21/2020 FINDINGS: CT CHEST FINDINGS Cardiovascular: Left chest single lead pacer defibrillator. Normal heart size. Extensive three-vessel coronary artery calcifications and stents. Small, unchanged pericardial effusion. Enlargement of the main pulmonary artery measuring up to 3.9 cm in caliber. Mediastinum/Nodes: No enlarged mediastinal, hilar, or axillary lymph nodes. Thyroid gland, trachea, and esophagus demonstrate no significant findings. Lungs/Pleura: BCriss Rosalesappearing, predominantly bandlike scarring of the bilateral lung bases. No pleural effusion or pneumothorax. Musculoskeletal: No chest wall mass. Focally severe disc degenerative disease versus osseous metastatic involvement of T6-T7 (series 6, image 104). CT ABDOMEN PELVIS FINDINGS Hepatobiliary: No solid liver abnormality is seen. No  gallstones, gallbladder wall thickening, or biliary  dilatation. Pancreas: Unremarkable. No pancreatic ductal dilatation or surrounding inflammatory changes. Spleen: Normal in size without significant abnormality. Adrenals/Urinary Tract: Adrenal glands are unremarkable. Kidneys are normal, without renal calculi, solid lesion, or hydronephrosis. Thickening of the urinary bladder wall. Stomach/Bowel: Stomach is within normal limits. Incidental diverticulum of the transverse portion of the duodenum. Appendix appears normal. No evidence of bowel wall thickening, distention, or inflammatory changes. Vascular/Lymphatic: Aortic atherosclerosis. No enlarged abdominal or pelvic lymph nodes. Reproductive: Mild prostatomegaly. Other: No abdominal wall hernia or abnormality. No abdominopelvic ascites. Musculoskeletal: Slight interval increase in sclerosis of osseous metastases involving the lumbar spine, for example the L2 vertebral body (series 6, image 107). IMPRESSION: 1. Slight interval increase in sclerosis of previously imaged osseous metastases involving the lumbar spine, most consistent with post treatment sclerosis of osseous metastatic disease. 2. Focally severe disc degenerative disease and sclerosis versus sclerotic osseous metastatic involvement of T6-T7, not previously imaged by CT. Attention on follow-up. 3. No noncontrast evidence of lymphadenopathy or soft tissue metastatic disease in the chest, abdomen, or pelvis. 4. Mild prostatomegaly. Thickening of the urinary bladder wall, likely due to chronic outlet obstruction. 5. Enlargement of the main pulmonary artery, as can be seen in pulmonary hypertension. 6. Coronary artery disease. Aortic Atherosclerosis (ICD10-I70.0). Electronically Signed   By: Delanna Ahmadi M.D.   On: 02/13/2021 11:24      ASSESSMENT & PLAN:  1. Prostate cancer ([place])   2. BRCA2 gene mutation positive   3. Encounter for antineoplastic chemotherapy   4. Metastatic cancer to bone ([place])   5. Normocytic anemia    Cancer Staging  Prostate  cancer [facility]) Staging form: Prostate, AJCC 8th Edition - Clinical stage from 05/11/2020: Stage IVB (cTX, cN0, cM1, PSA: 30) - Signed by Matthew Server, MD on 05/11/2020   #Metastatic prostate cancer to bone, castration sensitive Eligard 45 mg Q6 months with his PCP at [facility] given in November 2022. Labs reviewed and discussed with patient.  PSA 0.03. Continue darolutamide 600 mg twice daily. 03/11/2021, bone scan.  Showed uptake at T6 and L2, degree of uptake at L2 diminished since previous examination.  Uptake at T6 corresponds to the advanced degenerative disease change on CT.  Questionable uptake at T8 corresponds to mild degenerative disc disease.  No definitive osseous abnormalities at the posterior right third rib by CT.  #NGS showed BRCA 2 S1404, BRIP1 R173C 3X, FOXO1 1S378f. 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  Patient and her sister have discussed with genetic counselor.  I recommend first-degree relatives to check for genetic testing. Consider possible rucaparib/olaparib in subsequent lines of treatment .  #Normocytic anemia, drop of hemoglobin to 10.8.  Close monitor.  . Supportive care measures are necessary for patient well-being and will be provided as necessary. We spent sufficient time to discuss many aspect of care, questions were answered to patient's satisfaction.   All questions were answered. The patient knows to call the clinic with any problems questions or concerns. cc  [facility], [place]   Return of visit: 6 weeks    ZEarlie Server MD, PhD 04/01/2021

## 2021-05-05 ENCOUNTER — Ambulatory Visit: Payer: Medicare (Managed Care) | Admitting: Urology

## 2021-05-11 ENCOUNTER — Other Ambulatory Visit: Payer: Self-pay

## 2021-05-11 ENCOUNTER — Inpatient Hospital Stay: Payer: Medicare (Managed Care) | Attending: Oncology

## 2021-05-11 DIAGNOSIS — C61 Malignant neoplasm of prostate: Secondary | ICD-10-CM | POA: Insufficient documentation

## 2021-05-11 DIAGNOSIS — Z87891 Personal history of nicotine dependence: Secondary | ICD-10-CM | POA: Diagnosis not present

## 2021-05-11 DIAGNOSIS — Z807 Family history of other malignant neoplasms of lymphoid, hematopoietic and related tissues: Secondary | ICD-10-CM | POA: Insufficient documentation

## 2021-05-11 DIAGNOSIS — C7951 Secondary malignant neoplasm of bone: Secondary | ICD-10-CM | POA: Diagnosis not present

## 2021-05-11 DIAGNOSIS — Z803 Family history of malignant neoplasm of breast: Secondary | ICD-10-CM | POA: Insufficient documentation

## 2021-05-11 DIAGNOSIS — D649 Anemia, unspecified: Secondary | ICD-10-CM | POA: Diagnosis not present

## 2021-05-11 DIAGNOSIS — I110 Hypertensive heart disease with heart failure: Secondary | ICD-10-CM | POA: Insufficient documentation

## 2021-05-11 DIAGNOSIS — Z1509 Genetic susceptibility to other malignant neoplasm: Secondary | ICD-10-CM | POA: Diagnosis not present

## 2021-05-11 DIAGNOSIS — Z800 Family history of malignant neoplasm of digestive organs: Secondary | ICD-10-CM | POA: Insufficient documentation

## 2021-05-11 DIAGNOSIS — E119 Type 2 diabetes mellitus without complications: Secondary | ICD-10-CM | POA: Diagnosis not present

## 2021-05-11 LAB — CBC WITH DIFFERENTIAL/PLATELET
Abs Immature Granulocytes: 0.01 10*3/uL (ref 0.00–0.07)
Basophils Absolute: 0 10*3/uL (ref 0.0–0.1)
Basophils Relative: 0 %
Eosinophils Absolute: 0.1 10*3/uL (ref 0.0–0.5)
Eosinophils Relative: 2 %
HCT: 35.5 % — ABNORMAL LOW (ref 39.0–52.0)
Hemoglobin: 11.1 g/dL — ABNORMAL LOW (ref 13.0–17.0)
Immature Granulocytes: 0 %
Lymphocytes Relative: 14 %
Lymphs Abs: 0.6 10*3/uL — ABNORMAL LOW (ref 0.7–4.0)
MCH: 30.3 pg (ref 26.0–34.0)
MCHC: 31.3 g/dL (ref 30.0–36.0)
MCV: 97 fL (ref 80.0–100.0)
Monocytes Absolute: 0.4 10*3/uL (ref 0.1–1.0)
Monocytes Relative: 9 %
Neutro Abs: 3.4 10*3/uL (ref 1.7–7.7)
Neutrophils Relative %: 75 %
Platelets: 189 10*3/uL (ref 150–400)
RBC: 3.66 MIL/uL — ABNORMAL LOW (ref 4.22–5.81)
RDW: 14.4 % (ref 11.5–15.5)
WBC: 4.6 10*3/uL (ref 4.0–10.5)
nRBC: 0 % (ref 0.0–0.2)

## 2021-05-11 LAB — COMPREHENSIVE METABOLIC PANEL
ALT: 13 U/L (ref 0–44)
AST: 18 U/L (ref 15–41)
Albumin: 3.9 g/dL (ref 3.5–5.0)
Alkaline Phosphatase: 53 U/L (ref 38–126)
Anion gap: 8 (ref 5–15)
BUN: 13 mg/dL (ref 8–23)
CO2: 24 mmol/L (ref 22–32)
Calcium: 9.4 mg/dL (ref 8.9–10.3)
Chloride: 106 mmol/L (ref 98–111)
Creatinine, Ser: 0.72 mg/dL (ref 0.61–1.24)
GFR, Estimated: >60 mL/min (ref >60)
Glucose, Bld: 193 mg/dL — ABNORMAL HIGH (ref 70–99)
Potassium: 4 mmol/L (ref 3.5–5.1)
Sodium: 138 mmol/L (ref 135–145)
Total Bilirubin: 0.5 mg/dL (ref 0.3–1.2)
Total Protein: 7.1 g/dL (ref 6.5–8.1)

## 2021-05-11 LAB — PSA: Prostatic Specific Antigen: 0.05 ng/mL (ref 0.00–4.00)

## 2021-05-13 ENCOUNTER — Inpatient Hospital Stay (HOSPITAL_BASED_OUTPATIENT_CLINIC_OR_DEPARTMENT_OTHER): Payer: Medicare (Managed Care) | Admitting: Oncology

## 2021-05-13 ENCOUNTER — Encounter: Payer: Self-pay | Admitting: Oncology

## 2021-05-13 ENCOUNTER — Other Ambulatory Visit: Payer: Self-pay

## 2021-05-13 VITALS — BP 166/105 | HR 94 | Temp 96.3°F | Wt 195.0 lb

## 2021-05-13 DIAGNOSIS — C61 Malignant neoplasm of prostate: Secondary | ICD-10-CM | POA: Diagnosis not present

## 2021-05-13 DIAGNOSIS — Z5111 Encounter for antineoplastic chemotherapy: Secondary | ICD-10-CM | POA: Diagnosis not present

## 2021-05-13 DIAGNOSIS — Z1501 Genetic susceptibility to malignant neoplasm of breast: Secondary | ICD-10-CM

## 2021-05-13 DIAGNOSIS — C7951 Secondary malignant neoplasm of bone: Secondary | ICD-10-CM | POA: Diagnosis not present

## 2021-05-13 DIAGNOSIS — Z1509 Genetic susceptibility to other malignant neoplasm: Secondary | ICD-10-CM

## 2021-05-13 NOTE — Progress Notes (Signed)
Hematology/Oncology Progress note [phone] Fax:([phone]      Patient Care Team: Inc, [facility] as PCP - General [name], MD as Consulting Physician (Cardiology) Alisa Graff, FNP as Nurse Practitioner (Family Medicine) [name], MD as Consulting Physician (Hematology and Oncology)  REFERRING PROVIDER: [facility], [place] COMPLAINTS/REASON FOR VISIT:  metastatic prostate cancer  HISTORY OF PRESENTING ILLNESS:   Matthew Mcgrath is a  76 y.o.  male with PMH listed below was seen in consultation at the request of  [facility], [place]  for evaluation of metastatic prostate cancer. Patient was accompanied by her sister who he currently lives with.  History was mainly obtained from sister as well as reviewing medical records.  Patient is a poor historian. Per sister, she has noticed patient having increased urgency and frequency of urination.  Patient was evaluated by urologist Dr. Diamantina Providence in January 2022.  PSA was elevated at 30.4 in January 2022 04/01/2020, prostate biopsy showed  All cores were positive for high risk prostate cancer, primarily Gleason score 5+4 = 9, with max core involvement of 100% and perineural invasion present. 04/21/2020, CT abdomen pelvis showed a sclerotic lesion in the lumbar spine concerning for probable metastatic disease.  No definitive extra skeletal metastatic disease noted elsewhere in the abdomen or pelvis.  Mild circumferential bladder wall thickening.  Aortic atherosclerosis. 04/27/2020, bone scan showed foci of abnormal uptake noted in the mid thoracic and upper lumbar spine concerning for metastatic disease.  Patient reports some lower back pain intermittently.  He currently lives with his sister.   Past medical history includes CHF, hypertension, hyper lipidemia, diabetes type 2 Per sister, family history is positive for colon cancer and another brother, multiple myeloma in the other  brother.  04/16/2020 Firmagon 240 mg loading dose #06/11/2020 patient went to [facility] for second opinion was seen by [name] who agrees with continuing ADT with switch to Eligard 45 mg.  He is due to proceed with the treatments today.  Communicated with pace program [name], patient will receive treatment at [place]. [name] also recommends to start darolutamide and authorization process has been initiated at [facility]. #07/06/2020 started darolutamide 600 mg twice daily # definitive treatment for prostate and pelvic node- Discussed with [facility] [name] and I appreciate his expert opinion of darolutamide concurrent use with RT and ADT # 10/19/2020 finished definitive RT. To prostate cancer and pelvic lymph node treatments. #02/13/2021, CT chest abdomen pelvis without contrast showed slight interval increase in sclerosis of previously osseous metastasis involving lumbar spine, most consistent with posttreatment sclerosis.  Focally severe disc degeneration and sclerosis versus sclerotic osseous metastatic of T6 and 7.  Attention on follow-up.  No noncontrast evidence of lymphadenopathy or soft tissue metastatic disease in chest abdomen pelvis.  Prostamegaly.  Enlargement of main pulmonary artery.  Coronary artery disease.  Patient has genetic testing done which showed BRCA2 mutation.  INTERVAL HISTORY Matthew Mcgrath is a 76 y.o. male who has above history reviewed by me today presents for follow up visit for management of metastatic prostate cancer Patient was accompanied by his sister.  Early November 2022, Eligard injections via primary care provider's office. Patient has been on darolutamide 600 mg twice daily he has no new complaints..  Review of Systems  Constitutional:  Positive for fatigue. Negative for appetite change, chills, fever and unexpected weight change.  HENT:   Negative for hearing loss and voice change.   Eyes:  Negative for eye problems and  icterus.  Respiratory:  Negative for chest tightness,  cough and shortness of breath.   Cardiovascular:  Negative for chest pain and leg swelling.  Gastrointestinal:  Negative for abdominal distention and abdominal pain.  Endocrine: Negative for hot flashes.  Genitourinary:  Negative for difficulty urinating, dysuria and frequency.   Musculoskeletal:  Positive for back pain. Negative for arthralgias.  Skin:  Negative for itching and rash.  Neurological:  Negative for light-headedness and numbness.  Hematological:  Negative for adenopathy. Does not bruise/bleed easily.  Psychiatric/Behavioral:  Negative for confusion.    MEDICAL HISTORY:  Past Medical History:  Diagnosis Date   Cardiomyopathy ([place])    Congestive heart failure (HCC)    Diabetes mellitus type 2, controlled ([place])    Elevated lipids    Family history of breast cancer    Family history of colon cancer    HTN (hypertension)     SURGICAL HISTORY: Past Surgical History:  Procedure Laterality Date   CARDIAC CATHETERIZATION N/A 04/14/2015   Procedure: Left Heart Cath and Coronary Angiography;  Surgeon: [name], MD;  Location: [place] [facility];  Service: Cardiovascular;  Laterality: N/A;   CATARACT EXTRACTION W/ INTRAOCULAR LENS  IMPLANT, BILATERAL Left    COLONOSCOPY WITH PROPOFOL N/A 04/08/2020   Procedure: COLONOSCOPY WITH PROPOFOL;  Surgeon: Toledo, Benay Pike, MD;  Location: [facility];  Service: Gastroenterology;  Laterality: N/A;   HERNIA REPAIR     IMPLANTABLE CARDIOVERTER DEFIBRILLATOR (ICD) GENERATOR CHANGE Left 07/03/2015   Procedure: ICD IMPLANT single chamber;  Surgeon: Marzetta Board, MD;  Location: [facility] ORS;  Service: Cardiovascular;  Laterality: Left;    SOCIAL HISTORY: Social History   Socioeconomic History   Marital status: Single    Spouse name: Not on file   Number of children: Not on file   Years of education: Not on file   Highest education level: Not on file  Occupational History   Not on file  Tobacco Use   Smoking status: Former     Packs/day: 0.25    Years: 30.00    Pack years: 7.50    Types: Cigarettes    Quit date: 02/11/2020    Years since quitting: 1.2   Smokeless tobacco: Never  Vaping Use   Vaping Use: Never used  Substance and Sexual Activity   Alcohol use: No   Drug use: Not Currently    Frequency: 2.0 times per week    Types: Marijuana    Comment: past use   Sexual activity: Not on file  Other Topics Concern   Not on file  Social History Narrative   Not on file   Social Determinants of Health   Financial Resource Strain: Not on file  Food Insecurity: Not on file  Transportation Needs: Not on file  Physical Activity: Not on file  Stress: Not on file  Social Connections: Not on file  Intimate Partner Violence: Not on file    FAMILY HISTORY: Family History  Problem Relation Age of Onset   Heart failure Mother    Heart disease Mother    Alcoholism Father    Hypertension Sister    Diabetes Sister    Hypertension Brother    Diabetes Brother    Colon cancer Brother        vs prostate   Multiple myeloma Brother    Breast cancer Cousin     ALLERGIES:  has No Known Allergies.  MEDICATIONS:  Current Outpatient Medications  Medication Sig Dispense Refill   acetaminophen (TYLENOL)  650 MG CR tablet Take 650 mg by mouth every 8 (eight) hours as needed for pain.     aspirin EC 81 MG tablet @_0  1 TABLET BY MOUTH ONCE DAILY.     carvedilol (COREG) 6.25 MG tablet Take 6.25 mg by mouth 2 (two) times daily.     cholecalciferol (VITAMIN D3) 25 MCG (1000 UNIT) tablet Take 1,000 Units by mouth daily.     darolutamide (NUBEQA) 300 MG tablet Take 300 mg by mouth 2 (two) times daily with a meal.     Docosahexaenoic Acid (DHA OMEGA 3 PO) 1,000 mg.     furosemide (LASIX) 40 MG tablet Take 40 mg by mouth daily.     glipiZIDE (GLUCOTROL) 5 MG tablet Take by mouth daily before breakfast.     glucose blood (TRUE METRIX BLOOD GLUCOSE TEST) test strip 1 each by Other route as needed for other. Use as  instructed     JARDIANCE 10 MG TABS tablet Take 10 mg by mouth daily.     LIDODERM 5 % Apply 1 patch to skin every morning as needed for L lower back pain. Remove after 12 hours. Call for refills     lisinopril (ZESTRIL) 10 MG tablet Take 10 mg by mouth daily.     magnesium oxide (MAG-OX) 400 MG tablet Take 400 mg by mouth 2 (two) times daily.     metFORMIN (GLUCOPHAGE) 500 MG tablet Take by mouth 2 (two) times daily with a meal.     MIRALAX 17 GM/SCOOP powder Take 17 g by mouth daily.     Multiple Vitamin (MULTIVITAMIN ADULT PO) SMARTSIG:1 Tablet(s) By Mouth Daily     Omega-3 Fatty Acids (FISH OIL PO) Take by mouth.     potassium chloride (KLOR-CON) 10 MEQ tablet Take 10 mEq by mouth daily.     simvastatin (ZOCOR) 40 MG tablet Take 40 mg by mouth daily at 6 PM.      spironolactone (ALDACTONE) 25 MG tablet Take 25 mg by mouth daily.     tamsulosin (FLOMAX) 0.4 MG CAPS capsule Take 1 capsule (0.4 mg total) by mouth daily. 30 capsule 2   TRUEplus Lancets 28G MISC by Does not apply route.     No current facility-administered medications for this visit.     PHYSICAL EXAMINATION: ECOG PERFORMANCE STATUS: 1 - Symptomatic but completely ambulatory Vitals:   05/13/21 1100  BP: (!) 166/105  Pulse: 94  Temp: (!) 96.3 F (35.7 C)   Filed Weights   05/13/21 1100  Weight: 195 lb (88.5 kg)    Physical Exam Constitutional:      General: He is not in acute distress. HENT:     Head: Normocephalic and atraumatic.  Eyes:     General: No scleral icterus. Cardiovascular:     Rate and Rhythm: Normal rate and regular rhythm.     Heart sounds: Normal heart sounds.  Pulmonary:     Effort: Pulmonary effort is normal. No respiratory distress.     Breath sounds: No wheezing.  Abdominal:     General: Bowel sounds are normal. There is no distension.     Palpations: Abdomen is soft.  Musculoskeletal:        General: No deformity. Normal range of motion.     Cervical back: Normal range of motion  and neck supple.  Skin:    General: Skin is warm and dry.     Findings: No erythema or rash.  Neurological:     Mental Status: He  is alert and oriented to person, place, and time. Mental status is at baseline.     Cranial Nerves: No cranial nerve deficit.     Coordination: Coordination normal.  Psychiatric:        Mood and Affect: Mood normal.    LABORATORY DATA:  I have reviewed the data as listed Lab Results  Component Value Date   WBC 4.6 05/11/2021   HGB 11.1 (L) 05/11/2021   HCT 35.5 (L) 05/11/2021   MCV 97.0 05/11/2021   PLT 189 05/11/2021   Recent Labs    02/12/21 0838 03/30/21 1052 05/11/21 1324  NA 133* 136 138  K 4.2 4.2 4.0  CL 96* 103 106  CO2 _0 GLUCOSE 137* 167* 193*  BUN _1 CREATININE 0.70 0.65 0.72  CALCIUM 9.7 9.4 9.4  GFRNONAA >60 >60 >60  PROT 8.0 7.6 7.1  ALBUMIN 4.4 3.9 3.9  AST _2 ALT _3 ALKPHOS 53 49 53  BILITOT 0.4 0.5 0.5    Iron/TIBC/Ferritin/ %Sat    Component Value Date/Time   IRON 82 11/23/2020 0953   TIBC 335 11/23/2020 0953   FERRITIN 221 11/23/2020 0953   IRONPCTSAT 25 11/23/2020 0953     RADIOGRAPHIC STUDIES: I have personally reviewed the radiological images as listed and agreed with the findings in the report. NM Bone Scan Whole Body  Result Date: 03/11/2021 CLINICAL DATA:  Prostate cancer, PSA 0.023 weeks ago EXAM: NUCLEAR MEDICINE WHOLE BODY BONE SCAN TECHNIQUE: Whole body anterior and posterior images were obtained approximately 3 hours after intravenous injection of radiopharmaceutical. RADIOPHARMACEUTICALS:  20.52 mCi Technetium-75mMDP IV COMPARISON:  04/27/2020 Correlation: CT chest abdomen pelvis 02/12/2021 FINDINGS: Again identified uptake at midthoracic spine at T6. New subtle uptake T8 vertebra. Decreased uptake at previously seen site in lumbar spine at approximately L2. Uptake at shoulders, sternoclavicular joints, hips, feet, typically degenerative. Photopenic defect LEFT chest wall  from ICD generator. Questionable uptake posterior RIGHT third rib versus artifact. Expected urinary tract and soft tissue distribution of tracer. IMPRESSION: Again identified sites of uptake at T6 and L2, degree of uptake at L2 diminished since previous exam. L2 uptake corresponds to sclerotic metastasis on CT. Uptake at T6 corresponds to advanced degenerative disc disease changes on CT. Questioned uptake at T8 corresponds to mild degenerative disc disease changes on CT. No definite osseous abnormalities at posterior RIGHT third rib by CT, questionable scintigraphic significance. Electronically Signed   By: MLavonia DanaM.D.   On: 03/11/2021 17:24      ASSESSMENT & PLAN:  1. Encounter for antineoplastic chemotherapy   2. Prostate cancer ([place]   3. BRCA2 gene mutation positive   4. Metastatic cancer to bone ([facility]    Cancer Staging  Prostate cancer ([facility] Staging form: Prostate, AJCC 8th Edition - Clinical stage from 05/11/2020: Stage IVB (cTX, cN0, cM1, PSA: 30) - Signed by YEarlie Server MD on 05/11/2020   #Metastatic prostate cancer to bone, castration sensitive Eligard 45 mg Q6 [facility]. Recommend him to receive Eligard 45 mg in May 2023 Labs reviewed and discussed with patient PSA 0.05. Continue darolutamide 600 mg twice daily. 03/11/2021, bone scan showed improved/decreased uptake in T6 and L2.   #NGS showed BRCA 2 S1404, BRIP1 R173C 3X, FOXO1 [id number] TMB 3.1 mut/Mb, MS stable, PD-L1 1%  Genetic testing is positive for BRCA2 mutation. Consider possible rucaparib/olaparib in subsequent lines of treatment .  #  Normocytic anemia, drop of hemoglobin to 11.1.  Close monitor.  . Supportive care measures are necessary for patient well-being and will be provided as necessary. We spent sufficient time to discuss many aspect of care, questions were answered to patient's satisfaction.   All questions were answered. The patient knows to call the clinic with  any problems questions or concerns. cc  Inc, [place]    Return of visit: 6 weeks    [name], MD, PhD 05/13/2021

## 2021-05-27 ENCOUNTER — Ambulatory Visit (INDEPENDENT_AMBULATORY_CARE_PROVIDER_SITE_OTHER): Payer: Medicare (Managed Care) | Admitting: Urology

## 2021-05-27 ENCOUNTER — Encounter: Payer: Self-pay | Admitting: Urology

## 2021-05-27 ENCOUNTER — Other Ambulatory Visit: Payer: Self-pay

## 2021-05-27 VITALS — BP 119/80 | HR 83 | Ht 66.0 in | Wt 189.0 lb

## 2021-05-27 DIAGNOSIS — R350 Frequency of micturition: Secondary | ICD-10-CM

## 2021-05-27 DIAGNOSIS — C61 Malignant neoplasm of prostate: Secondary | ICD-10-CM | POA: Diagnosis not present

## 2021-05-27 LAB — BLADDER SCAN AMB NON-IMAGING: Scan Result: 1

## 2021-05-27 NOTE — Progress Notes (Signed)
? ?  05/27/2021 ?5:12 PM  ? ?Matthew Mcgrath ?08-Dec-1945 ?[id number] ? ?Reason for visit: Follow up metastatic prostate cancer, urinary symptoms ? ?HPI: ?76 year old comorbid male here with his sister today for follow-up of the above issues.  She helps him with most of his medical decisions.  He presented with an elevated PSA of 30 and an abnormal DRE and underwent a prostate biopsy in January 2022 that showed a 56 g prostate and all cores were positive for high risk prostate cancer, primarily Gleason score 5+ 4 = 9 with max core involvement of 100%.  Bone scan showed foci of abnormal uptake in the mid thoracic and upper lumbar spine consistent with metastatic disease.  He is managed by oncology with ADT, darolutamide, and completed radiation to the prostate and pelvic lymph nodes.  Most recent PSA on 05/11/2021 had increased slightly to 0.05 from 0.03 prior.  I personally viewed and interpreted the most recent staging imaging with CT and bone scan from December 2022 that shows relatively stable disease, and no significant bladder distention and no hydronephrosis. ? ?At baseline he has had some urinary frequency, but since completing radiation had had some increase in his urination overnight, and at our last visit we opted for a trial of Flomax nightly.  He does report some improvement on this medication and would like to continue with the Flomax.  PVR is normal at 1 mL.  We also discussed that his diabetes and Lasix likely contribute to his urinary frequency as well as nocturia. ? ?Urinalysis today, call with results.  Consider cystoscopy if microscopic hematuria ?Continue Flomax ?RTC 1 year PVR and symptom check ? ? ?Billey Co, MD ? ?[facility] [place] ?[address] ?[place], [place] [address] ?[phone] ? ? ?

## 2021-06-01 ENCOUNTER — Other Ambulatory Visit: Payer: Medicare (Managed Care)

## 2021-06-01 ENCOUNTER — Other Ambulatory Visit: Payer: Self-pay

## 2021-06-01 DIAGNOSIS — R350 Frequency of micturition: Secondary | ICD-10-CM

## 2021-06-01 LAB — URINALYSIS, COMPLETE
Bilirubin, UA: NEGATIVE
Ketones, UA: NEGATIVE
Leukocytes,UA: NEGATIVE
Nitrite, UA: NEGATIVE
Specific Gravity, UA: 1.02 (ref 1.005–1.030)
Urobilinogen, Ur: 0.2 mg/dL (ref 0.2–1.0)
pH, UA: 5 (ref 5.0–7.5)

## 2021-06-01 LAB — MICROSCOPIC EXAMINATION: RBC, Urine: >30 /hpf — AB (ref 0–2)

## 2021-06-02 ENCOUNTER — Telehealth: Payer: Self-pay

## 2021-06-02 NOTE — Telephone Encounter (Signed)
-----   Message from Billey Co, MD sent at 06/02/2021  9:40 AM EDT ----- ?He did have some microscopic blood in the urine, but no obvious infection.  Recommend cystoscopy for further evaluation of his urinary symptoms and the microscopic blood ? ?Nickolas Madrid, MD ?06/02/2021 ? ? ?

## 2021-06-02 NOTE — Telephone Encounter (Signed)
Called pt's sister per DPR, call was picked up and then line disconnected. 1st attempt.  ? ?Called pt's sister informed her of information below. She voiced understanding. Appt scheduled.  ?

## 2021-06-05 LAB — CULTURE, URINE COMPREHENSIVE

## 2021-06-17 ENCOUNTER — Other Ambulatory Visit: Payer: Self-pay | Admitting: Urology

## 2021-06-17 ENCOUNTER — Other Ambulatory Visit: Payer: Self-pay

## 2021-06-17 ENCOUNTER — Telehealth: Payer: Self-pay

## 2021-06-17 ENCOUNTER — Ambulatory Visit (INDEPENDENT_AMBULATORY_CARE_PROVIDER_SITE_OTHER): Payer: Medicare (Managed Care) | Admitting: Urology

## 2021-06-17 VITALS — BP 119/75 | HR 90 | Ht 66.0 in | Wt 191.8 lb

## 2021-06-17 DIAGNOSIS — R350 Frequency of micturition: Secondary | ICD-10-CM

## 2021-06-17 DIAGNOSIS — D494 Neoplasm of unspecified behavior of bladder: Secondary | ICD-10-CM

## 2021-06-17 DIAGNOSIS — R310 Gross hematuria: Secondary | ICD-10-CM | POA: Diagnosis not present

## 2021-06-17 MED ORDER — LIDOCAINE HCL URETHRAL/MUCOSAL 2 % EX GEL
1.0000 "application " | Freq: Once | CUTANEOUS | Status: AC
  Administered 2021-06-17: 1 via URETHRAL

## 2021-06-17 NOTE — Patient Instructions (Signed)
Transurethral Resection of Bladder Tumor ?Transurethral resection of a bladder tumor is the removal (resection) of a cancerous growth (tumor) on the inside wall of the bladder. The bladder is the organ that holds urine. The tumor is removed through the tube that carries urine out of the body (urethra). ?In a transurethral resection, a thin telescope with a light, a tiny camera, and an electric cutting edge (resectoscope) is passed through the urethra. In men, the opening of the urethra is at the end of the penis. In women, it is just above the opening of the vagina. ?Tell a health care provider about: ?Any allergies you have. ?All medicines you are taking, including vitamins, herbs, eye drops, creams, and over-the-counter medicines. ?Any problems you or family members have had with anesthetic medicines. ?Any blood disorders you have. ?Any surgeries you have had. ?Any medical conditions you have. ?Any recent urinary tract infections you have had. ?Whether you are pregnant or may be pregnant. ?What are the risks? ?Generally, this is a safe procedure. However, problems may occur, including: ?Infection. ?Bleeding. ?Allergic reactions to medicines. ?Damage to nearby structures or organs, such as: ?The urethra. ?The tubes that drain urine from the kidneys into the bladder (ureters). ?Pain and burning during urination. ?Difficulty urinating due to partial blockage of the urethra. ?Inability to urinate (urinary retention). ?What happens before the procedure? ?Staying hydrated ?Follow instructions from your health care provider about hydration, which may include: ?Up to 2 hours before the procedure - you may continue to drink clear liquids, such as water, clear fruit juice, black coffee, and plain tea. ? ?Eating and drinking restrictions ?Follow instructions from your health care provider about eating and drinking, which may include: ?8 hours before the procedure - stop eating heavy meals or foods, such as meat, fried foods,  or fatty foods. ?6 hours before the procedure - stop eating light meals or foods, such as toast or cereal. ?6 hours before the procedure - stop drinking milk or drinks that contain milk. ?2 hours before the procedure - stop drinking clear liquids. ?Medicines ?Ask your health care provider about: ?Changing or stopping your regular medicines. This is especially important if you are taking diabetes medicines or blood thinners. ?Taking medicines such as aspirin and ibuprofen. These medicines can thin your blood. Do not take these medicines unless your health care provider tells you to take them. ?Taking over-the-counter medicines, vitamins, herbs, and supplements. ?Tests ?You may have exams or tests, including: ?Physical exam. ?Blood tests. ?Urine tests. ?Electrocardiogram (ECG). This test measures the electrical activity of the heart. ?General instructions ?Plan to have someone take you home from the hospital or clinic. ?Ask your health care provider how your surgical site will be marked or identified. ?Ask your health care provider what steps will be taken to help prevent infection. These may include: ?Washing skin with a germ-killing soap. ?Taking antibiotic medicine. ?What happens during the procedure? ?An IV will be inserted into one of your veins. ?You will be given one or more of the following: ?A medicine to help you relax (sedative). ?A medicine to make you fall asleep (general anesthetic). ?A medicine that is injected into your spine to numb the area below and slightly above the injection site (spinal anesthetic). ?Your legs will be placed in foot rests (stirrups) so that your legs are apart and your knees are bent. ?The resectoscope will be passed through your urethra and into your bladder. ?The part of your bladder that is affected by the tumor will be  resected using the cutting edge of the resectoscope. ?The resectoscope will be removed. ?A thin, flexible tube (catheter) will be passed through your urethra  and into your bladder. The catheter will drain urine into a bag outside of your body. ?Fluid may be passed through the catheter to keep the catheter open. ?The procedure may vary among health care providers and hospitals. ?What happens after the procedure? ?Your blood pressure, heart rate, breathing rate, and blood oxygen level will be monitored until you leave the hospital or clinic. ?You may continue to receive fluids and medicines through an IV. ?You will have some pain. You will be given pain medicine to relieve pain. ?You will have a catheter to drain your urine. ?You will have blood in your urine. Your catheter may be kept in until your urine is clear. ?The amount of urine will be monitored. If necessary, your bladder may be rinsed out (irrigated) by passing fluid through your catheter. ?You will be encouraged to walk around as soon as possible. ?You may have to wear compression stockings. These stockings help to prevent blood clots and reduce swelling in your legs. ?Do not drive for 24 hours if you were given a sedative during your procedure. ?Summary ?Transurethral resection of a bladder tumor is the removal (resection) of a cancerous growth (tumor) on the inside wall of the bladder. ?To do this procedure, your health care provider uses a thin telescope with a light, a tiny camera, and an electric cutting edge (resectoscope). ?Follow your health care provider's instructions. You may need to stop or change certain medicines, and you may be told to stop eating and drinking several hours before the procedure. ?Your blood pressure, heart rate, breathing rate, and blood oxygen level will be monitored until you leave the hospital or clinic. ?You may have to wear compression stockings. These stockings help to prevent blood clots and reduce swelling in your legs. ?This information is not intended to replace advice given to you by your health care provider. Make sure you discuss any questions you have with your  health care provider. ?Document Revised: 09/21/2017 Document Reviewed: 09/22/2017 ?Elsevier Patient Education ? [employer]. ? ?Transurethral Resection of Bladder Tumor ?Transurethral resection of a bladder tumor is the removal (resection) of a cancerous growth (tumor) on the inside wall of the bladder. The bladder is the organ that holds urine. The tumor is removed through the tube that carries urine out of the body (urethra). ?In a transurethral resection, a thin telescope with a light, a tiny camera, and an electric cutting edge (resectoscope) is passed through the urethra. In men, the opening of the urethra is at the end of the penis. In women, it is just above the opening of the vagina. ?Tell a health care provider about: ?Any allergies you have. ?All medicines you are taking, including vitamins, herbs, eye drops, creams, and over-the-counter medicines. ?Any problems you or family members have had with anesthetic medicines. ?Any blood disorders you have. ?Any surgeries you have had. ?Any medical conditions you have. ?Any recent urinary tract infections you have had. ?Whether you are pregnant or may be pregnant. ?What are the risks? ?Generally, this is a safe procedure. However, problems may occur, including: ?Infection. ?Bleeding. ?Allergic reactions to medicines. ?Damage to nearby structures or organs, such as: ?The urethra. ?The tubes that drain urine from the kidneys into the bladder (ureters). ?Pain and burning during urination. ?Difficulty urinating due to partial blockage of the urethra. ?Inability to urinate (urinary retention). ?What happens  before the procedure? ?Staying hydrated ?Follow instructions from your health care provider about hydration, which may include: ?Up to 2 hours before the procedure - you may continue to drink clear liquids, such as water, clear fruit juice, black coffee, and plain tea. ? ?Eating and drinking restrictions ?Follow instructions from your health care provider  about eating and drinking, which may include: ?8 hours before the procedure - stop eating heavy meals or foods, such as meat, fried foods, or fatty foods. ?6 hours before the procedure - stop eating light mea

## 2021-06-17 NOTE — Progress Notes (Signed)
Surgical Physician Order Form [facility] [place] ? ?* Scheduling expectation : ASAP (4/21 or 4/24) ? ?*Length of Case: 1 hour ? ?*Clearance needed: no ? ?*Anticoagulation Instructions: Hold all anticoagulants ? ?*Aspirin Instructions: Hold Aspirin ? ?*Post-op visit Date/Instructions:  1-2 week follow up ? ?*Diagnosis: Bladder Tumor ? ?*Procedure:     TURBT 2-5cm ([id number]), bilateral retrograde pyelograms ? ? ?Additional orders: Gemcitabine '2000mg'$  bladder instillation ? ?-Admit type: OUTpatient ? ?-Anesthesia: General ? ?-VTE Prophylaxis Standing Order SCD?s    ?   ?Other:  ? ?-Standing Lab Orders Per Anesthesia   ? ?Lab other: UA&Urine Culture ? ?-Standing Test orders EKG/Chest x-ray per Anesthesia      ? ?Test other:  ? ?- Medications:  Ancef 2gm IV ? ?-Other orders:  N/A ? ? ? ?  ? ?

## 2021-06-17 NOTE — Progress Notes (Signed)
Cystoscopy Procedure Note: ? ?Indication: Microscopic hematuria ? ?76 year old comorbid male with history of metastatic prostate cancer treated with oncology on ADT, darolutamide, and also had radiation to the prostate and pelvic nodes.  Most recent PSA 0.05 in March 2023.  He has minimal urinary symptoms on Flomax but was found to have microscopic hematuria and consented to cystoscopy for further evaluation.   ? ?After informed consent and discussion of the procedure and its risks, Matthew Mcgrath was positioned and prepped in the standard fashion. Cystoscopy was performed with a flexible cystoscope. The urethra, bladder neck and entire bladder was visualized in a standard fashion.  The prostate was short. Vision was poor secondary to hematuria, and 60 mL of light red urine was aspirated from the bladder.  There was a spherical 2 to 3 cm lesion at the left posterior dome of the bladder concerning for malignancy with some papillary changes.  There were no other suspicious lesions. ? ? ?Findings: ?2 to 3 cm bladder tumor at left posterior dome of the bladder ? ?----------------------------------------------------------------------------- ? ?Assessment and Plan: ?Comorbid 76 year old male with metastatic prostate cancer managed by oncology with ADT, darolutamide, and also has history of radiation of the prostate and pelvic nodes.  He has had some ongoing hematuria and today on cystoscopy found to have a 2 to 3 cm bladder tumor.  I had a long discussion with the patient and his sister today who helps him make medical decisions, and recommended TURBT for treatment and diagnosis.  This appears to be separate from his previously diagnosed prostate cancer. ? ?We discussed transurethral resection of bladder tumor (TURBT) and risks and benefits at length. This is typically a 1 to 2-hour procedure done under general anesthesia in the operating room.  A scope is inserted through the urethra and used to resect abnormal tissue  within the bladder, which is then sent to the pathologist to determine grade and stage of the tumor.  Risks include bleeding, infection, need for temporary Foley placement, and bladder perforation.  Treatment strategies are based on the type of tumor and depth of invasion.  We briefly reviewed the different treatment pathways for non-muscle invasive and muscle invasive bladder cancer. ? ?Schedule TURBT and bilateral retrograde pyelograms ? ?Nickolas Madrid, MD ?06/17/2021  ? ?

## 2021-06-17 NOTE — Telephone Encounter (Signed)
I spoke with Matthew Mcgrath and sister while in office. We have discussed possible surgery dates and Friday April 21st, 2023 was agreed upon by all parties. Patient given information about surgery date, what to expect pre-operatively and post operatively.  ?We discussed that a Pre-Admission Testing office will be calling to set up the pre-op visit that will take place prior to surgery, and that these appointments are typically done over the phone with a Pre-Admissions RN. ? Informed patient that our office will communicate any additional care to be provided after surgery. Patients questions or concerns were discussed during our call. Advised to call our office should there be any additional information, questions or concerns that arise. Patient verbalized understanding.  ? ?

## 2021-06-18 ENCOUNTER — Emergency Department: Payer: Medicare (Managed Care)

## 2021-06-18 ENCOUNTER — Other Ambulatory Visit: Payer: Self-pay

## 2021-06-18 ENCOUNTER — Inpatient Hospital Stay
Admission: EM | Admit: 2021-06-18 | Discharge: 2021-06-21 | DRG: 863 | Disposition: A | Discharge status: Home / Self Care | Payer: Medicare (Managed Care) | Attending: Internal Medicine | Admitting: Internal Medicine

## 2021-06-18 DIAGNOSIS — Z800 Family history of malignant neoplasm of digestive organs: Secondary | ICD-10-CM | POA: Diagnosis not present

## 2021-06-18 DIAGNOSIS — R310 Gross hematuria: Secondary | ICD-10-CM

## 2021-06-18 DIAGNOSIS — Z807 Family history of other malignant neoplasms of lymphoid, hematopoietic and related tissues: Secondary | ICD-10-CM | POA: Diagnosis not present

## 2021-06-18 DIAGNOSIS — Z833 Family history of diabetes mellitus: Secondary | ICD-10-CM | POA: Diagnosis not present

## 2021-06-18 DIAGNOSIS — D62 Acute posthemorrhagic anemia: Secondary | ICD-10-CM | POA: Diagnosis present

## 2021-06-18 DIAGNOSIS — Z9221 Personal history of antineoplastic chemotherapy: Secondary | ICD-10-CM | POA: Diagnosis not present

## 2021-06-18 DIAGNOSIS — Z87891 Personal history of nicotine dependence: Secondary | ICD-10-CM | POA: Diagnosis not present

## 2021-06-18 DIAGNOSIS — Z7984 Long term (current) use of oral hypoglycemic drugs: Secondary | ICD-10-CM

## 2021-06-18 DIAGNOSIS — D494 Neoplasm of unspecified behavior of bladder: Secondary | ICD-10-CM | POA: Diagnosis present

## 2021-06-18 DIAGNOSIS — A419 Sepsis, unspecified organism: Principal | ICD-10-CM

## 2021-06-18 DIAGNOSIS — E1151 Type 2 diabetes mellitus with diabetic peripheral angiopathy without gangrene: Secondary | ICD-10-CM | POA: Diagnosis present

## 2021-06-18 DIAGNOSIS — Z7982 Long term (current) use of aspirin: Secondary | ICD-10-CM

## 2021-06-18 DIAGNOSIS — Z6372 Alcoholism and drug addiction in family: Secondary | ICD-10-CM

## 2021-06-18 DIAGNOSIS — Z811 Family history of alcohol abuse and dependence: Secondary | ICD-10-CM

## 2021-06-18 DIAGNOSIS — I10 Essential (primary) hypertension: Secondary | ICD-10-CM | POA: Diagnosis not present

## 2021-06-18 DIAGNOSIS — Z803 Family history of malignant neoplasm of breast: Secondary | ICD-10-CM | POA: Diagnosis not present

## 2021-06-18 DIAGNOSIS — D638 Anemia in other chronic diseases classified elsewhere: Secondary | ICD-10-CM | POA: Diagnosis present

## 2021-06-18 DIAGNOSIS — I5022 Chronic systolic (congestive) heart failure: Secondary | ICD-10-CM | POA: Diagnosis not present

## 2021-06-18 DIAGNOSIS — I429 Cardiomyopathy, unspecified: Secondary | ICD-10-CM | POA: Diagnosis not present

## 2021-06-18 DIAGNOSIS — Z20822 Contact with and (suspected) exposure to covid-19: Secondary | ICD-10-CM | POA: Diagnosis present

## 2021-06-18 DIAGNOSIS — E119 Type 2 diabetes mellitus without complications: Secondary | ICD-10-CM | POA: Diagnosis not present

## 2021-06-18 DIAGNOSIS — N390 Urinary tract infection, site not specified: Secondary | ICD-10-CM | POA: Diagnosis present

## 2021-06-18 DIAGNOSIS — B9620 Unspecified Escherichia coli [E. coli] as the cause of diseases classified elsewhere: Secondary | ICD-10-CM | POA: Diagnosis present

## 2021-06-18 DIAGNOSIS — T8144XA Sepsis following a procedure, initial encounter: Principal | ICD-10-CM | POA: Diagnosis present

## 2021-06-18 DIAGNOSIS — Z95810 Presence of automatic (implantable) cardiac defibrillator: Secondary | ICD-10-CM | POA: Diagnosis not present

## 2021-06-18 DIAGNOSIS — I110 Hypertensive heart disease with heart failure: Secondary | ICD-10-CM | POA: Diagnosis present

## 2021-06-18 DIAGNOSIS — C61 Malignant neoplasm of prostate: Secondary | ICD-10-CM | POA: Diagnosis not present

## 2021-06-18 DIAGNOSIS — D649 Anemia, unspecified: Secondary | ICD-10-CM

## 2021-06-18 DIAGNOSIS — Z8249 Family history of ischemic heart disease and other diseases of the circulatory system: Secondary | ICD-10-CM

## 2021-06-18 DIAGNOSIS — N3001 Acute cystitis with hematuria: Secondary | ICD-10-CM | POA: Diagnosis present

## 2021-06-18 DIAGNOSIS — T8140XA Infection following a procedure, unspecified, initial encounter: Secondary | ICD-10-CM | POA: Diagnosis present

## 2021-06-18 DIAGNOSIS — Z79899 Other long term (current) drug therapy: Secondary | ICD-10-CM

## 2021-06-18 DIAGNOSIS — I2510 Atherosclerotic heart disease of native coronary artery without angina pectoris: Secondary | ICD-10-CM | POA: Diagnosis present

## 2021-06-18 LAB — CBC WITH DIFFERENTIAL/PLATELET
Abs Immature Granulocytes: 0.03 10*3/uL (ref 0.00–0.07)
Basophils Absolute: 0 10*3/uL (ref 0.0–0.1)
Basophils Relative: 0 %
Eosinophils Absolute: 0 10*3/uL (ref 0.0–0.5)
Eosinophils Relative: 1 %
HCT: 36 % — ABNORMAL LOW (ref 39.0–52.0)
Hemoglobin: 11 g/dL — ABNORMAL LOW (ref 13.0–17.0)
Immature Granulocytes: 0 %
Lymphocytes Relative: 8 %
Lymphs Abs: 0.6 10*3/uL — ABNORMAL LOW (ref 0.7–4.0)
MCH: 29.3 pg (ref 26.0–34.0)
MCHC: 30.6 g/dL (ref 30.0–36.0)
MCV: 95.7 fL (ref 80.0–100.0)
Monocytes Absolute: 0.7 10*3/uL (ref 0.1–1.0)
Monocytes Relative: 9 %
Neutro Abs: 6.2 10*3/uL (ref 1.7–7.7)
Neutrophils Relative %: 82 %
Platelets: 199 10*3/uL (ref 150–400)
RBC: 3.76 MIL/uL — ABNORMAL LOW (ref 4.22–5.81)
RDW: 13.9 % (ref 11.5–15.5)
WBC: 7.5 10*3/uL (ref 4.0–10.5)
nRBC: 0 % (ref 0.0–0.2)

## 2021-06-18 LAB — COMPREHENSIVE METABOLIC PANEL
ALT: 15 U/L (ref 0–44)
AST: 23 U/L (ref 15–41)
Albumin: 3.8 g/dL (ref 3.5–5.0)
Alkaline Phosphatase: 61 U/L (ref 38–126)
Anion gap: 9 (ref 5–15)
BUN: 25 mg/dL — ABNORMAL HIGH (ref 8–23)
CO2: 22 mmol/L (ref 22–32)
Calcium: 9.4 mg/dL (ref 8.9–10.3)
Chloride: 107 mmol/L (ref 98–111)
Creatinine, Ser: 0.93 mg/dL (ref 0.61–1.24)
GFR, Estimated: >60 mL/min (ref >60)
Glucose, Bld: 191 mg/dL — ABNORMAL HIGH (ref 70–99)
Potassium: 4 mmol/L (ref 3.5–5.1)
Sodium: 138 mmol/L (ref 135–145)
Total Bilirubin: 0.6 mg/dL (ref 0.3–1.2)
Total Protein: 7.4 g/dL (ref 6.5–8.1)

## 2021-06-18 LAB — RESP PANEL BY RT-PCR (FLU A&B, COVID) ARPGX2
Influenza A by PCR: NEGATIVE
Influenza B by PCR: NEGATIVE
SARS Coronavirus 2 by RT PCR: NEGATIVE

## 2021-06-18 LAB — LACTIC ACID, PLASMA
Lactic Acid, Venous: 1.8 mmol/L (ref 0.5–1.9)
Lactic Acid, Venous: 1.9 mmol/L (ref 0.5–1.9)

## 2021-06-18 LAB — URINALYSIS, ROUTINE W REFLEX MICROSCOPIC
Bilirubin Urine: NEGATIVE
Glucose, UA: >=500 mg/dL — AB
Ketones, ur: NEGATIVE mg/dL
Nitrite: NEGATIVE
Protein, ur: 100 mg/dL — AB
RBC / HPF: >50 RBC/hpf — ABNORMAL HIGH (ref 0–5)
Specific Gravity, Urine: 1.026 (ref 1.005–1.030)
Squamous Epithelial / HPF: NONE SEEN (ref 0–5)
WBC, UA: >50 WBC/hpf — ABNORMAL HIGH (ref 0–5)
pH: 6 (ref 5.0–8.0)

## 2021-06-18 LAB — PROTIME-INR
INR: 1 (ref 0.8–1.2)
Prothrombin Time: 13.3 seconds (ref 11.4–15.2)

## 2021-06-18 LAB — APTT: aPTT: 33 seconds (ref 24–36)

## 2021-06-18 LAB — BRAIN NATRIURETIC PEPTIDE: B Natriuretic Peptide: 13.5 pg/mL (ref 0.0–100.0)

## 2021-06-18 MED ORDER — IOHEXOL 300 MG/ML  SOLN
100.0000 mL | Freq: Once | INTRAMUSCULAR | Status: AC | PRN
  Administered 2021-06-18: 100 mL via INTRAVENOUS

## 2021-06-18 MED ORDER — ACETAMINOPHEN 325 MG PO TABS
650.0000 mg | ORAL_TABLET | Freq: Once | ORAL | Status: AC
  Administered 2021-06-18: 650 mg via ORAL

## 2021-06-18 MED ORDER — ACETAMINOPHEN 325 MG PO TABS
ORAL_TABLET | ORAL | Status: AC
  Filled 2021-06-18: qty 2

## 2021-06-18 MED ORDER — LACTATED RINGERS IV BOLUS
1000.0000 mL | Freq: Once | INTRAVENOUS | Status: AC
  Administered 2021-06-18: 1000 mL via INTRAVENOUS

## 2021-06-18 MED ORDER — CEFTRIAXONE SODIUM 1 G IJ SOLR
1.0000 g | Freq: Once | INTRAMUSCULAR | Status: AC
  Administered 2021-06-18: 1 g via INTRAVENOUS
  Filled 2021-06-18: qty 10

## 2021-06-18 NOTE — ED Provider Notes (Signed)
? ?[facility] ?Provider Note ? ? ? Event Date/Time  ? First MD Initiated Contact with Patient 06/18/21 2057   ?  (approximate) ? ? ?History  ? ?Post-op Problem ? ? ?HPI ? ?Matthew Mcgrath is a 76 y.o. male with a past medical history of CHF, DM, HDL, HTN and cardiomyopathy as well as metastatic prostate cancer treated with oncology on ADT, darolutamide having undergone cystoscopy yesterday for evaluation of hematuria with findings including 2 to 3 cm bladder tumor at the left posterior dome of the bladder who presents for evaluation of ongoing gross hematuria with passage of some blood clots.  Patient is a somewhat poor historian and endorses some back pain although he states this is been present for a long time and is unclear if there is acute component of this.  He was unable to localize it.  He denies any burning with urination or known fevers prior to arrival.  No abdominal pain, nausea, vomiting, diarrhea, chest pain, cough, shortness of breath, headache, earache, sore throat rash or recent falls.  States he is only on ASA for anticoagulation.  He denies any other acute concerns at this time.  He is accompanied by family who assist in providing some of the history. ? ?  ? ? ?Physical Exam  ?Triage Vital Signs: ?ED Triage Vitals  ?Enc Vitals Group  ?   BP 06/18/21 1959 (!) 139/93  ?   Pulse Rate 06/18/21 1959 (!) 112  ?   Resp 06/18/21 1959 18  ?   Temp 06/18/21 1959 (!) 100.8 ?F (38.2 ?C)  ?   Temp Source 06/18/21 1959 Oral  ?   SpO2 06/18/21 1959 94 %  ?   Weight --   ?   Height --   ?   Head Circumference --   ?   Peak Flow --   ?   Pain Score 06/18/21 2008 3  ?   Pain Loc --   ?   Pain Edu? --   ?   Excl. in [place]? --   ? ? ?Most recent vital signs: ?Vitals:  ? 06/18/21 2259 06/18/21 2300  ?BP:  (!) 155/87  ?Pulse:  92  ?Resp:  17  ?Temp: 98.7 ?F (37.1 ?C)   ?SpO2:  97%  ? ? ?General: Awake, no distress.  ?CV:  Good peripheral perfusion.  2+ radial pulses.  Slightly  tachycardic. ?Resp:  Normal effort.  Clear bilaterally ?Abd:  No distention.  Soft. ?Other:  No significant CVA tenderness. ? ? ?ED Results / Procedures / Treatments  ?Labs ?(all labs ordered are listed, but only abnormal results are displayed) ?Labs Reviewed  ?COMPREHENSIVE METABOLIC PANEL - Abnormal; Notable for the following components:  ?    Result Value  ? Glucose, Bld 191 (*)   ? BUN 25 (*)   ? All other components within normal limits  ?CBC WITH DIFFERENTIAL/PLATELET - Abnormal; Notable for the following components:  ? RBC 3.76 (*)   ? Hemoglobin 11.0 (*)   ? HCT 36.0 (*)   ? Lymphs Abs 0.6 (*)   ? All other components within normal limits  ?URINALYSIS, ROUTINE W REFLEX MICROSCOPIC - Abnormal; Notable for the following components:  ? Color, Urine AMBER (*)   ? APPearance CLOUDY (*)   ? Glucose, UA >=500 (*)   ? Hgb urine dipstick MODERATE (*)   ? Protein, ur 100 (*)   ? Leukocytes,Ua SMALL (*)   ? RBC / HPF >50 (*)   ?  WBC, UA >50 (*)   ? Bacteria, UA MANY (*)   ? All other components within normal limits  ?RESP PANEL BY RT-PCR (FLU A&B, COVID) [id number]  ?CULTURE, BLOOD (ROUTINE X 2)  ?CULTURE, BLOOD (ROUTINE X 2)  ?URINE CULTURE  ?LACTIC ACID, PLASMA  ?LACTIC ACID, PLASMA  ?PROTIME-INR  ?APTT  ?BRAIN NATRIURETIC PEPTIDE  ? ? ? ?EKG ? ? ?RADIOLOGY ?Chest x-ray my interpretation shows some bilateral atelectasis without focal consolidation, effusion, edema, pneumothorax or other clear acute thoracic process.  I also reviewed radiology interpretation. ? ?CT abdomen pelvis reviewed myself shows some circumferential bowel wall thickening fairly diffuse concerning for possible colitis as well as some bladder wall thickening consistent with possible cystitis.  I also reviewed radiology interpretation and agree with their findings of extensive CAD, perivascular disease and some gas and bladder lumen in keeping with intraluminal clot but no extraluminal gas or fluid to suggest perforation. ? ? ?PROCEDURES: ? ?Critical  Care performed: Yes, see critical care procedure note(s) ? ?.Critical Care ?Performed by: Lucrezia Starch, MD ?Authorized by: Lucrezia Starch, MD  ? ?Critical care provider statement:  ?  Critical care time (minutes):  30 ?  Critical care was necessary to treat or prevent imminent or life-threatening deterioration of the following conditions:  Sepsis ?  Critical care was time spent personally by me on the following activities:  Development of treatment plan with patient or surrogate, discussions with consultants, evaluation of patient's response to treatment, examination of patient, ordering and review of laboratory studies, ordering and review of radiographic studies, ordering and performing treatments and interventions, pulse oximetry, re-evaluation of patient's condition and review of old charts ? ? ? ?MEDICATIONS ORDERED IN ED: ?Medications  ?acetaminophen (TYLENOL) 325 MG tablet (  Not Given 06/18/21 2129)  ?acetaminophen (TYLENOL) tablet 650 mg (650 mg Oral Given 06/18/21 2014)  ?lactated ringers bolus 1,000 mL (1,000 mLs Intravenous New Bag/Given 06/18/21 2129)  ?cefTRIAXone (ROCEPHIN) 1 g in sodium chloride 0.9 % 100 mL IVPB (0 g Intravenous Stopped 06/18/21 2237)  ?iohexol (OMNIPAQUE) 300 MG/ML solution 100 mL (100 mLs Intravenous Contrast Given 06/18/21 2155)  ? ? ? ?IMPRESSION / MDM / ASSESSMENT AND PLAN / ED COURSE  ?I reviewed the triage vital signs and the nursing notes. ?             ?               ? ?Differential diagnosis includes, but is not limited to possible sepsis from recent procedure given he is febrile at 108 and tachycardic at 112 on arrival.  Possible etiologies include simple cystitis, bladder perforation, pyelonephritis, pneumonia as there is no obvious evidence of a cellulitis or other clear source of infection on exam. ? ?Chest x-ray my interpretation shows some bilateral atelectasis without focal consolidation, effusion, edema, pneumothorax or other clear acute thoracic process.  I also  reviewed radiology interpretation. ? ?CT abdomen pelvis reviewed myself shows some circumferential bowel wall thickening fairly diffuse concerning for possible colitis as well as some bladder wall thickening consistent with possible cystitis.  I also reviewed radiology interpretation and agree with their findings of extensive CAD, perivascular disease and some gas and bladder lumen in keeping with intraluminal clot but no extraluminal gas or fluid to suggest perforation. ? ?CMP shows a glucose of 191 without any other significant electrolyte or metabolic derangements.  Lactic acid 1.8.  CBC without leukocytosis and hemoglobin of 11.  Platelets are unremarkable.  UA is concerning  for urinary source of infection with small leukocyte esterase and greater than 50 RBCs as well as gram 50 WBCs and many bacteria noted.  Lactic acid 1.9.  INR 1.  PTT is within normal limits.  COVID influenza PCR is negative. ? ?Discussed with Dr. Hollice Espy with urology concerns for sepsis likely from urinary source and recent procedure.  She recommends treating infection with no urgent surgical intervention at this time.  Blood cultures obtained.  Patient started on Rocephin.  Discussed concern for sepsis from urinary source with admitting hospitalist will place admission orders ? ?  ? ? ?FINAL CLINICAL IMPRESSION(S) / ED DIAGNOSES  ? ?Final diagnoses:  ?Sepsis, due to unspecified organism, unspecified whether acute organ dysfunction present [facility])  ?Acute cystitis with hematuria  ? ? ? ?Rx / DC Orders  ? ?ED Discharge Orders   ? ? None  ? ?  ? ? ? ?Note:  This document was prepared using Dragon voice recognition software and may include unintentional dictation errors. ?  ?Lucrezia Starch, MD ?06/19/21 0008 ? ?

## 2021-06-18 NOTE — ED Triage Notes (Signed)
Pt had a scope of bladder done yesterday and c/o blood in urine and clots this morning. Pt is AOX4, NAD noted. Pt takes aspirin daily. Pt reports lower back pain. No N/V/D, CP, SOB.  ?

## 2021-06-18 NOTE — ED Notes (Signed)
New urine sample sent to lab as very dark red/brown. Attending Damita Dunnings notified via secure chat.  ?

## 2021-06-18 NOTE — Progress Notes (Signed)
Pt being followed by [facility] for Sepsis protocol. 

## 2021-06-18 NOTE — Progress Notes (Signed)
[facility] Posting Form  ? ?Surgery Date/Time: Date: 06/25/2021 ? ?Surgeon: Dr. Nickolas Madrid, MD ? ?Surgery Location: Day Surgery ? ?Inpt ( No  )   Outpt (Yes)   Obs ( No  )  ? ?Diagnosis: D49.4 Bladder Tumor ? ?-CPT: [id number], [id number], [id number] ? ?Surgery: Transurethral Resection of Bladder Tumor with bilateral retrograde pyelograms and intravesical instillation of Gemcitabine ? ?Stop Anticoagulations: Yes And hold ASA ? ?Cardiac/Medical/Pulmonary Clearance needed: no ? ?*Orders entered into EPIC  Date: 06/18/21  ? ?*Case booked in [place]  Date: 06/17/2021 ? ?*Notified pt of Surgery: Date: 06/17/2021 ? ?PRE-OP UA & CX: yes, obtained in clinic on 06/17/2021 ? ?*Placed into Prior Authorization Work Fabio Bering Date: 06/18/21 ? ? ?Assistant/laser/rep:No ? ? ? ? ? ? ? ? ? ? ? ? ? ? ? ?

## 2021-06-19 DIAGNOSIS — N390 Urinary tract infection, site not specified: Secondary | ICD-10-CM | POA: Diagnosis not present

## 2021-06-19 DIAGNOSIS — R310 Gross hematuria: Secondary | ICD-10-CM

## 2021-06-19 DIAGNOSIS — I10 Essential (primary) hypertension: Secondary | ICD-10-CM

## 2021-06-19 DIAGNOSIS — Z95810 Presence of automatic (implantable) cardiac defibrillator: Secondary | ICD-10-CM

## 2021-06-19 DIAGNOSIS — I5022 Chronic systolic (congestive) heart failure: Secondary | ICD-10-CM | POA: Diagnosis not present

## 2021-06-19 DIAGNOSIS — E119 Type 2 diabetes mellitus without complications: Secondary | ICD-10-CM

## 2021-06-19 DIAGNOSIS — C61 Malignant neoplasm of prostate: Secondary | ICD-10-CM

## 2021-06-19 DIAGNOSIS — I429 Cardiomyopathy, unspecified: Secondary | ICD-10-CM | POA: Diagnosis not present

## 2021-06-19 DIAGNOSIS — A419 Sepsis, unspecified organism: Secondary | ICD-10-CM

## 2021-06-19 LAB — CBC
HCT: 31.5 % — ABNORMAL LOW (ref 39.0–52.0)
Hemoglobin: 10 g/dL — ABNORMAL LOW (ref 13.0–17.0)
MCH: 30.1 pg (ref 26.0–34.0)
MCHC: 31.7 g/dL (ref 30.0–36.0)
MCV: 94.9 fL (ref 80.0–100.0)
Platelets: 176 10*3/uL (ref 150–400)
RBC: 3.32 MIL/uL — ABNORMAL LOW (ref 4.22–5.81)
RDW: 14 % (ref 11.5–15.5)
WBC: 7.4 10*3/uL (ref 4.0–10.5)
nRBC: 0 % (ref 0.0–0.2)

## 2021-06-19 LAB — PROCALCITONIN: Procalcitonin: 0.26 ng/mL

## 2021-06-19 LAB — PROTIME-INR
INR: 1.1 (ref 0.8–1.2)
Prothrombin Time: 13.8 seconds (ref 11.4–15.2)

## 2021-06-19 LAB — GLUCOSE, CAPILLARY
Glucose-Capillary: 297 mg/dL — ABNORMAL HIGH (ref 70–99)
Glucose-Capillary: 115 mg/dL — ABNORMAL HIGH (ref 70–99)
Glucose-Capillary: 133 mg/dL — ABNORMAL HIGH (ref 70–99)
Glucose-Capillary: 260 mg/dL — ABNORMAL HIGH (ref 70–99)
Glucose-Capillary: 177 mg/dL — ABNORMAL HIGH (ref 70–99)
Glucose-Capillary: 122 mg/dL — ABNORMAL HIGH (ref 70–99)

## 2021-06-19 LAB — HEMOGLOBIN A1C
Hgb A1c MFr Bld: 7 % — ABNORMAL HIGH (ref 4.8–5.6)
Mean Plasma Glucose: 154.2 mg/dL

## 2021-06-19 LAB — BASIC METABOLIC PANEL
Anion gap: 9 (ref 5–15)
BUN: 17 mg/dL (ref 8–23)
CO2: 23 mmol/L (ref 22–32)
Calcium: 9 mg/dL (ref 8.9–10.3)
Chloride: 105 mmol/L (ref 98–111)
Creatinine, Ser: 0.73 mg/dL (ref 0.61–1.24)
GFR, Estimated: >60 mL/min (ref >60)
Glucose, Bld: 135 mg/dL — ABNORMAL HIGH (ref 70–99)
Potassium: 3.7 mmol/L (ref 3.5–5.1)
Sodium: 137 mmol/L (ref 135–145)

## 2021-06-19 LAB — CORTISOL-AM, BLOOD: Cortisol - AM: 20.1 ug/dL (ref 6.7–22.6)

## 2021-06-19 MED ORDER — ONDANSETRON HCL 4 MG PO TABS: 4.0000 mg | ORAL_TABLET | Freq: Four times a day (QID) | ORAL | Status: DC | PRN

## 2021-06-19 MED ORDER — MORPHINE SULFATE (PF) 2 MG/ML IV SOLN: 2.0000 mg | INTRAVENOUS | Status: DC | PRN

## 2021-06-19 MED ORDER — INSULIN ASPART 100 UNIT/ML IJ SOLN
0.0000 [IU] | INTRAMUSCULAR | Status: DC
  Administered 2021-06-19: 2 [IU] via SUBCUTANEOUS
  Administered 2021-06-19: 3 [IU] via SUBCUTANEOUS
  Administered 2021-06-19 (×2): 8 [IU] via SUBCUTANEOUS
  Administered 2021-06-20: 3 [IU] via SUBCUTANEOUS
  Administered 2021-06-20: 2 [IU] via SUBCUTANEOUS
  Administered 2021-06-20 (×2): 3 [IU] via SUBCUTANEOUS
  Administered 2021-06-20: 5 [IU] via SUBCUTANEOUS
  Administered 2021-06-21: 3 [IU] via SUBCUTANEOUS
  Administered 2021-06-21: 2 [IU] via SUBCUTANEOUS
  Filled 2021-06-19 (×11): qty 1

## 2021-06-19 MED ORDER — LACTATED RINGERS IV SOLN: INTRAVENOUS | Status: AC

## 2021-06-19 MED ORDER — SODIUM CHLORIDE 0.9 % IV SOLN
2.0000 g | Freq: Three times a day (TID) | INTRAVENOUS | Status: DC
  Administered 2021-06-19 – 2021-06-21 (×7): 2 g via INTRAVENOUS
  Filled 2021-06-19: qty 12.5
  Filled 2021-06-19 (×2): qty 2
  Filled 2021-06-19: qty 12.5
  Filled 2021-06-19: qty 2
  Filled 2021-06-19: qty 12.5
  Filled 2021-06-19: qty 2
  Filled 2021-06-19: qty 12.5

## 2021-06-19 MED ORDER — ONDANSETRON HCL 4 MG/2ML IJ SOLN: 4.0000 mg | Freq: Four times a day (QID) | INTRAMUSCULAR | Status: DC | PRN

## 2021-06-19 MED ORDER — LIDOCAINE 5 % EX PTCH
1.0000 | MEDICATED_PATCH | CUTANEOUS | Status: DC
Start: 2021-06-19 — End: 2021-06-21
  Administered 2021-06-19: 1 via TRANSDERMAL
  Filled 2021-06-19 (×3): qty 1

## 2021-06-19 NOTE — ED Notes (Signed)
Transport arranged; pt will head up to floor soon. Floor RN notified.   ?

## 2021-06-19 NOTE — H&P (Signed)
?History and Physical  ? ? ?Patient: Matthew Mcgrath VCB:[id number] DOB: 04/01/1945 ?DOA: 06/18/2021 ?DOS: the patient was seen and examined on 06/19/2021 ?PCP: [facility] [employer]  ?Patient coming from: Home ? ?Chief Complaint:  ?Chief Complaint  ?Patient presents with  ? Post-op Problem  ? ?HPI: Matthew Mcgrath is a 76 y.o. 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  Patient was being worked up for hematuria by his urologist and underwent cystoscopy on 4/13 with findings of a bladder tumor 2 to 3 cm in the posterior dome of the bladder.  He denies abdominal pain, fever or chills. ?ED course and data review: On arrival febrile to 100.8, pulse 112 with BP 139/93 blood work with WBC 7500 and hemoglobin 11, CMP unremarkable, urinalysis with moderate hemoglobin and otherwise consistent with UTI.  Lactic acid 1.9.  COVID and flu negative.  EKG, personally viewed and interpreted: Sinus at 95 with nonspecific ST-T wave changes.  CT abdomen and pelvis with contrast shows the following: ?IMPRESSION: ?Interval development of mild circumferential bowel wall thickening ?and perivesicular inflammatory stranding. Correlation with ?urinalysis and urine culture may be helpful to exclude a ?superimposed infectious or inflammatory process. Superimposed ?lobulated hyperdense material with interstitial gas dependently ?within the bladder lumen is in keeping with intraluminal clot. No ?extraluminal gas or fluid identified to suggest perforation. ?  ?Extensive coronary artery calcification. Peripheral vascular ?disease. If there is clinical evidence of hemodynamically ?significant renal artery stenosis or chronic mesenteric ischemia, CT ?arteriography may be helpful for further evaluation. ? ?The ED provider spoke with on-call  urologist, Dr. Hollice Espy who advised to treat sepsis and admit to medical service.  Hospitalist consulted for admission.  ?Review of Systems: As mentioned in the history of present illness. All other systems reviewed and are negative. ?Past Medical History:  ?Diagnosis Date  ? Cardiomyopathy ([place])   ? Congestive heart failure ([place])   ? Diabetes mellitus type 2, controlled ([place])   ? Elevated lipids   ? Family history of breast cancer   ? Family history of colon cancer   ? HTN (hypertension)   ? ?Past Surgical History:  ?Procedure Laterality Date  ? CARDIAC CATHETERIZATION N/A 04/14/2015  ? Procedure: Left Heart Cath and Coronary Angiography;  Surgeon: [name], MD;  Location: [facility];  Service: Cardiovascular;  Laterality: N/A;  ? CATARACT EXTRACTION W/ INTRAOCULAR LENS  IMPLANT, BILATERAL Left   ? COLONOSCOPY WITH PROPOFOL N/A 04/08/2020  ? Procedure: COLONOSCOPY WITH PROPOFOL;  Surgeon: Toledo, Benay Pike, MD;  Location: [facility];  Service: Gastroenterology;  Laterality: N/A;  ? HERNIA REPAIR    ? IMPLANTABLE CARDIOVERTER DEFIBRILLATOR (ICD) GENERATOR CHANGE Left 07/03/2015  ? Procedure: ICD IMPLANT single chamber;  Surgeon: Marzetta Board, MD;  Location: [facility];  Service: Cardiovascular;  Laterality: Left;  ? ?Social History:  reports that he quit smoking about 16 months ago. His smoking use included cigarettes. He has a 7.50 pack-year smoking history. He has never used smokeless tobacco. He reports that he does not currently use drugs after having used the following drugs: Marijuana. Frequency: 2.00 times per week. He reports that he does not drink alcohol. ? ?No Known Allergies ? ?Family History  ?Problem Relation Age of Onset  ? Heart failure Mother   ? Heart disease Mother   ? Alcoholism  Father   ? Hypertension Sister   ? Diabetes Sister   ? Hypertension Brother   ? Diabetes Brother   ? Colon cancer Brother   ?     vs prostate  ? Multiple myeloma Brother   ? Breast cancer Cousin    ? ? ?Prior to Admission medications   ?Medication Sig Start Date End Date Taking? Authorizing Provider  ?acetaminophen (TYLENOL) 650 MG CR tablet Take 650 mg by mouth every 8 (eight) hours as needed for pain.    [provider]  ?aspirin EC 81 MG tablet @_0  1 TABLET BY MOUTH ONCE DAILY. 06/04/20   [provider]  ?carvedilol (COREG) 6.25 MG tablet Take 6.25 mg by mouth 2 (two) times daily. 11/05/20   [provider]  ?cholecalciferol (VITAMIN D3) 25 MCG (1000 UNIT) tablet Take 1,000 Units by mouth daily.    [provider]  ?darolutamide (NUBEQA) 300 MG tablet Take 300 mg by mouth 2 (two) times daily with a meal. 06/02/20   [provider]  ?Docosahexaenoic Acid (DHA OMEGA 3 PO) 1,000 mg. 03/28/20   [provider]  ?furosemide (LASIX) 40 MG tablet Take 40 mg by mouth daily.    [provider]  ?glipiZIDE (GLUCOTROL) 5 MG tablet Take by mouth daily before breakfast.    [provider]  ?glucose blood (TRUE METRIX BLOOD GLUCOSE TEST) test strip 1 each by Other route as needed for other. Use as instructed    [provider]  ?JARDIANCE 10 MG TABS tablet Take 10 mg by mouth daily. 03/07/21   [provider]  ?LIDODERM 5 % Apply 1 patch to skin every morning as needed for L lower back pain. Remove after 12 hours. Call for refills 05/06/20   [provider]  ?lisinopril (ZESTRIL) 10 MG tablet Take 10 mg by mouth daily.    [provider]  ?magnesium oxide (MAG-OX) 400 MG tablet Take 400 mg by mouth 2 (two) times daily.    [provider]  ?metFORMIN (GLUCOPHAGE) 500 MG tablet Take by mouth 2 (two) times daily with a meal.    [provider]  ?MIRALAX 17 GM/SCOOP powder Take 17 g by mouth daily. 09/05/19   [provider]  ?Multiple Vitamin (MULTIVITAMIN ADULT PO) SMARTSIG:1 Tablet(s) By Mouth Daily 03/28/20   [provider]  ?Omega-3 Fatty Acids (FISH OIL PO) Take by mouth.     [provider]  ?potassium chloride (KLOR-CON) 10 MEQ tablet Take 10 mEq by mouth daily. 12/31/19   [provider]  ?simvastatin (ZOCOR) 40 MG tablet Take 40 mg by mouth daily at 6 PM.     [provider]  ?spironolactone (ALDACTONE) 25 MG tablet Take 25 mg by mouth daily.    [provider]  ?tamsulosin (FLOMAX) 0.4 MG CAPS capsule Take 1 capsule (0.4 mg total) by mouth daily. 02/04/21   Billey Co, MD  ?TRUEplus Lancets 28G MISC by Does not apply route.    [provider]  ? ? ?Physical Exam: ?Vitals:  ? 06/19/21 0000 06/19/21 0015 06/19/21 0040 06/19/21 0040  ?BP:    (!) 146/78  ?Pulse: 63   97  ?Resp:  20  18  ?Temp:    99 ?F (37.2 ?C)  ?TempSrc:      ?SpO2: 96%   98%  ?Weight:   87.2 kg   ?Height:   _1  (1.676 m)   ? ?Physical Exam ?Vitals and nursing note reviewed.  ?Constitutional:   ?  General: He is not in acute distress. ?HENT:  ?   Head: Normocephalic and atraumatic.  ?Cardiovascular:  ?   Rate and Rhythm: Normal rate and regular rhythm.  ?   Pulses: Normal pulses.  ?   Heart sounds: Normal heart sounds.  ?Pulmonary:  ?   Effort: Pulmonary effort is normal.  ?   Breath sounds: Normal breath sounds.  ?Abdominal:  ?   Palpations: Abdomen is soft.  ?   Tenderness: There is no abdominal tenderness.  ?Genitourinary: ?   Comments: Urine red in color ?Neurological:  ?   Mental Status: Mental status is at baseline.  ? ? ?Data Reviewed: ?Relevant notes from primary care and specialist visits, past discharge summaries as available in EHR, including Care Everywhere. ?Prior diagnostic testing as pertinent to current admission diagnoses ?Updated medications and problem lists for reconciliation ?ED course, including vitals, labs, imaging, treatment and response to treatment ?Triage notes, nursing and pharmacy notes and ED provider's notes ?Notable results as noted in HPI ? ? ?Assessment and Plan: ?* Complicated UTI (urinary tract infection) ?Patient presenting with  sepsis secondary to UTI following cystoscopy procedure the day prior indicated for gross hematuria ?Cefepime and follow cultures ?Treat sepsis as outlined below until physiology resolves ? ?Sepsis ([place]) ?Sepsis crit

## 2021-06-19 NOTE — Assessment & Plan Note (Signed)
No acute issues.  Management as above ?

## 2021-06-19 NOTE — Assessment & Plan Note (Signed)
Management as above °

## 2021-06-19 NOTE — Assessment & Plan Note (Signed)
Patient presenting with sepsis secondary to UTI following cystoscopy procedure the day prior indicated for gross hematuria ?Cefepime and follow cultures ?Treat sepsis as outlined below until physiology resolves ?

## 2021-06-19 NOTE — Assessment & Plan Note (Signed)
Continue lisinopril and carvedilol as well as spironolactone and furosemide ?

## 2021-06-19 NOTE — Assessment & Plan Note (Signed)
Continue lisinopril and carvedilol ?

## 2021-06-19 NOTE — Assessment & Plan Note (Signed)
No acute issues.  Urology consulted ?

## 2021-06-19 NOTE — Assessment & Plan Note (Signed)
Sliding scale insulin coverage 

## 2021-06-19 NOTE — Assessment & Plan Note (Addendum)
Sepsis criteria includes fever tachycardia and source of infection ?Continue sepsis fluids and monitor for fluid overload in view of systolic heart failure and cardiomyopathy ?Cefepime as above ?

## 2021-06-19 NOTE — Progress Notes (Signed)
?PROGRESS NOTE ? ? ? ?PARAS Matthew Mcgrath  EKC:[id number] DOB: Apr 12, 1945 DOA: 06/18/2021 ?PCP: [facility] [employer]  ? ?Brief Narrative:  ?Per HPI: ?Matthew Mcgrath is a 76 y.o. 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  Patient was being worked up for hematuria by his urologist and underwent cystoscopy on 4/13 with findings of a bladder tumor 2 to 3 cm in the posterior dome of the bladder.  He denies abdominal pain, fever or chills. ?ED course and data review: On arrival febrile to 100.8, pulse 112 with BP 139/93 blood work with WBC 7500 and hemoglobin 11, CMP unremarkable, urinalysis with moderate hemoglobin and otherwise consistent with UTI.  Lactic acid 1.9.  COVID and flu negative.  EKG, personally viewed and interpreted: Sinus at 95 with nonspecific ST-T wave changes.  CT abdomen and pelvis with contrast shows the following: ?IMPRESSION: ?Interval development of mild circumferential bowel wall thickening ?and perivesicular inflammatory stranding. Correlation with ?urinalysis and urine culture may be helpful to exclude a ?superimposed infectious or inflammatory process. Superimposed ?lobulated hyperdense material with interstitial gas dependently ?within the bladder lumen is in keeping with intraluminal clot. No ?extraluminal gas or fluid identified to suggest perforation. ?  ?Extensive coronary artery calcification. Peripheral vascular ?disease. If there is clinical evidence of hemodynamically ?significant renal artery stenosis or chronic mesenteric ischemia, CT ?arteriography may be helpful for further evaluation. ?  ?The ED provider spoke with on-call urologist, Dr. Hollice Espy who advised to treat sepsis and admit to medical service.  Hospitalist consulted for admission.  ? ? ?Assessment & Plan: ?   ?Principal Problem: ?  Complicated UTI (urinary tract infection) ?Active Problems: ?  Sepsis ([name]) ?  Gross hematuria ?  Prostate cancer ([name]) ?  Essential (primary) hypertension ?  Chronic systolic heart failure ([name]) ?  Cardiomyopathy ([name]) ?  ICD (implantable cardioverter-defibrillator) in place ?  Type 2 diabetes mellitus without complications ([name]) ? ? ?Complicated UTI (urinary tract infection) ?Patient presenting with sepsis secondary to UTI following cystoscopy procedure the day prior indicated for gross hematuria ?Cefepime and follow cultures-urine culture remains in process ?Blood cultures negative times 2 x 12 hours ?Treat sepsis as outlined below until physiology resolves ?  ?Sepsis ([name]) ?Sepsis criteria includes fever tachycardia and source of infection ?Continue sepsis fluids and monitor for fluid overload in view of systolic heart failure and cardiomyopathy ?Cefepime as above ?  ?Gross hematuria ?Bladder tumor seen on cystoscopy on 4/13 ?Urology consulted-discussed the case with them no need for acute intervention ?Intervention contraindicated in the setting of infection ?Monitor H&H ?DC n.p.o. status start diet ?  ?Prostate cancer ([name]) ?No acute issues.  Urology consulted as above ?  ?Type 2 diabetes mellitus without complications ([name]) ?Sliding scale insulin coverage ?  ?ICD (implantable cardioverter-defibrillator) in place ?No acute issues.  Management as above ?  ?Cardiomyopathy ([name]) ?Management as above ?  ?Chronic systolic heart failure ([name]) ?Continue lisinopril and carvedilol as well as spironolactone and furosemide ?  ?Essential (primary) hypertension ?Continue lisinopril and carvedilol ? ?DVT prophylaxis: SCD/Compression stockings  ?Code Status: FULL ? ?  ?Code Status Orders  ?(From admission, onward)  ?  ? ? ?  ? ?  Start     Ordered  ? 06/19/21 0101  Full code  Continuous       ? 06/19/21 0102  ? ?  ?  ? ?  ? ?  Code Status History   ? ? Date Active Date Inactive Code Status Order ID  Comments User Context  ? 07/03/2015 1850 07/04/2015 1523 Full Code [id number]  Marzetta Board, MD Inpatient  ? ?  ? ?Family Communication: Matthew Mcgrath  ?Disposition Plan:   NOT MEDICALLY STABLE, TREATING SEPSIS WITH IV ABX AND MONITORING HGB IN SETTING OF HEMATURIA ?Consults called: None ?Admission status: Inpatient ? ? ?Consultants:  ?CURBSIDED UROLOGY ? ?Procedures:  ?DG Chest 2 View ? ?Result Date: 06/18/2021 ?CLINICAL DATA:  Fever, tachycardia EXAM: CHEST - 2 VIEW COMPARISON:  07/04/2015 FINDINGS: Frontal and lateral views of the chest demonstrate stable AICD. Cardiac silhouette is unremarkable. Linear opacities at the lung bases, left greater than right, most consistent with scarring or subsegmental atelectasis. No acute airspace disease, effusion, or pneumothorax. No acute bony abnormalities. IMPRESSION: 1. Bilateral lower lobe subsegmental atelectasis or scarring. No acute airspace disease. Electronically Signed   By: Randa Ngo M.D.   On: 06/18/2021 22:28  ? ?CT ABDOMEN PELVIS W CONTRAST ? ?Result Date: 06/18/2021 ?CLINICAL DATA:  Abdominal pain, acute, nonlocalized. Hematuria with clots following cystoscopy. EXAM: CT ABDOMEN AND PELVIS WITH CONTRAST TECHNIQUE: Multidetector CT imaging of the abdomen and pelvis was performed using the standard protocol following bolus administration of intravenous contrast. RADIATION DOSE REDUCTION: This exam was performed according to the departmental dose-optimization program which includes automated exposure control, adjustment of the mA and/or kV according to patient size and/or use of iterative reconstruction technique. CONTRAST:  121m OMNIPAQUE IOHEXOL 300 MG/ML  SOLN COMPARISON:  None. FINDINGS: Lower chest: Mild bibasilar atelectasis or scarring. Extensive coronary artery calcification. Pacemaker leads noted within the right heart. Small hiatal hernia. Hepatobiliary: No focal liver abnormality is seen. No gallstones, gallbladder wall thickening, or biliary  dilatation. Pancreas: Unremarkable Spleen: Unremarkable Adrenals/Urinary Tract: The adrenal glands are unremarkable. The kidneys are normal in size and position. No hydronephrosis. No enhancing intrarenal masses. No intrarenal or ureteral calculi. The bladder is circumferentially thick walled and there is mild perivesicular inflammatory stranding,. There is nondependent gas within the lumen as well as lobulated hyperdense material containing interstitial gas dependently within the bladder lumen in keeping with blood clot. No extraluminal gas or fluid collection identified. Stomach/Bowel: Stomach is within normal limits. Appendix appears normal. No evidence of bowel wall thickening, distention, or inflammatory changes. Vascular/Lymphatic: Extensive atherosclerotic calcification and moderate atheromatous plaque is seen within the aortoiliac vasculature. No aortic aneurysm. Particularly prominent atherosclerotic calcification at the origin of the mesenteric and renal vasculature may result in hemodynamically significant stenosis, though this is not well assessed on this non arteriographic study. No pathologic adenopathy within the abdomen and pelvis. Reproductive: Prostate is unremarkable. Other: Small, shallow, broad-based fat containing umbilical hernia. Probable right inguinal hernia repair Musculoskeletal: No acute bone abnormality. Degenerative changes are seen within the lumbar spine. IMPRESSION: Interval development of mild circumferential bowel wall thickening and perivesicular inflammatory stranding. Correlation with urinalysis and urine culture may be helpful to exclude a superimposed infectious or inflammatory process. Superimposed lobulated hyperdense material with interstitial gas dependently within the bladder lumen is in keeping with intraluminal clot. No extraluminal gas or fluid identified to suggest perforation. Extensive coronary artery calcification. Peripheral vascular disease. If there is clinical  evidence of hemodynamically significant renal artery stenosis or chronic mesenteric ischemia, CT arteriography may be helpful for further evaluation. Aortic Atherosclerosis (ICD10-I70.0). Electronically Signed   B

## 2021-06-19 NOTE — Assessment & Plan Note (Addendum)
Bladder tumor seen on cystoscopy on 4/13 ?Urology consult ?Monitor H&H ?We will keep n.p.o. in case of procedure ?

## 2021-06-19 NOTE — Progress Notes (Signed)
Pharmacy Antibiotic Note ? ?Matthew Mcgrath is a 76 y.o. male admitted on 06/18/2021 with UTI.  Pharmacy has been consulted for Cefepime dosing. ? ?Plan: ?Ceftriaxone 2 gm IV X 1 given on 4/14 @ 2128. ? ?Cefepime 2 gm IV Q8H ordered to start on 4/15 @ 0600.  ? ?Height: '5\' 6"'$  (167.6 cm) ?Weight: 87.2 kg (192 lb 3.9 oz) ?IBW/kg (Calculated) : 63.8 ? ?Temp (24hrs), Avg:99.5 ?F (37.5 ?C), Min:98.7 ?F (37.1 ?C), Max:100.8 ?F (38.2 ?C) ? ?Recent Labs  ?Lab 06/18/21 ?2000 06/18/21 ?2110  ?WBC 7.5  --   ?CREATININE 0.93  --   ?LATICACIDVEN 1.8 1.9  ?  ?Estimated Creatinine Clearance: 73.2 mL/min (by C-G formula based on SCr of 0.93 mg/dL).   ? ?No Known Allergies ? ?Antimicrobials this admission: ?  >>  ?  >>  ? ?Dose adjustments this admission: ? ? ?Microbiology results: ? BCx:  ? UCx:   ? Sputum:   ? MRSA PCR:  ? ?Thank you for allowing pharmacy to be a part of this patient?s care. ? ?Parsa Rickett D ?06/19/2021 1:13 AM ? ?

## 2021-06-20 DIAGNOSIS — N390 Urinary tract infection, site not specified: Secondary | ICD-10-CM | POA: Diagnosis not present

## 2021-06-20 DIAGNOSIS — I429 Cardiomyopathy, unspecified: Secondary | ICD-10-CM | POA: Diagnosis not present

## 2021-06-20 DIAGNOSIS — A419 Sepsis, unspecified organism: Secondary | ICD-10-CM | POA: Diagnosis not present

## 2021-06-20 DIAGNOSIS — I5022 Chronic systolic (congestive) heart failure: Secondary | ICD-10-CM | POA: Diagnosis not present

## 2021-06-20 LAB — CBC WITH DIFFERENTIAL/PLATELET
Abs Immature Granulocytes: 0.02 K/uL (ref 0.00–0.07)
Basophils Absolute: 0 K/uL (ref 0.0–0.1)
Basophils Relative: 0 %
Eosinophils Absolute: 0.1 K/uL (ref 0.0–0.5)
Eosinophils Relative: 1 %
HCT: 30.6 % — ABNORMAL LOW (ref 39.0–52.0)
Hemoglobin: 9.5 g/dL — ABNORMAL LOW (ref 13.0–17.0)
Immature Granulocytes: 0 %
Lymphocytes Relative: 9 %
Lymphs Abs: 0.6 K/uL — ABNORMAL LOW (ref 0.7–4.0)
MCH: 29.2 pg (ref 26.0–34.0)
MCHC: 31 g/dL (ref 30.0–36.0)
MCV: 94.2 fL (ref 80.0–100.0)
Monocytes Absolute: 0.7 K/uL (ref 0.1–1.0)
Monocytes Relative: 9 %
Neutro Abs: 5.6 K/uL (ref 1.7–7.7)
Neutrophils Relative %: 81 %
Platelets: 169 K/uL (ref 150–400)
RBC: 3.25 MIL/uL — ABNORMAL LOW (ref 4.22–5.81)
RDW: 13.9 % (ref 11.5–15.5)
WBC: 7 K/uL (ref 4.0–10.5)
nRBC: 0 % (ref 0.0–0.2)

## 2021-06-20 LAB — BASIC METABOLIC PANEL
Anion gap: 7 (ref 5–15)
BUN: 16 mg/dL (ref 8–23)
CO2: 24 mmol/L (ref 22–32)
Calcium: 8.6 mg/dL — ABNORMAL LOW (ref 8.9–10.3)
Chloride: 105 mmol/L (ref 98–111)
Creatinine, Ser: 0.71 mg/dL (ref 0.61–1.24)
GFR, Estimated: >60 mL/min (ref >60)
Glucose, Bld: 157 mg/dL — ABNORMAL HIGH (ref 70–99)
Potassium: 3.6 mmol/L (ref 3.5–5.1)
Sodium: 136 mmol/L (ref 135–145)

## 2021-06-20 LAB — GLUCOSE, CAPILLARY
Glucose-Capillary: 155 mg/dL — ABNORMAL HIGH (ref 70–99)
Glucose-Capillary: 116 mg/dL — ABNORMAL HIGH (ref 70–99)
Glucose-Capillary: 165 mg/dL — ABNORMAL HIGH (ref 70–99)
Glucose-Capillary: 192 mg/dL — ABNORMAL HIGH (ref 70–99)
Glucose-Capillary: 203 mg/dL — ABNORMAL HIGH (ref 70–99)
Glucose-Capillary: 135 mg/dL — ABNORMAL HIGH (ref 70–99)

## 2021-06-20 NOTE — Evaluation (Signed)
Physical Therapy Evaluation ?Patient Details ?Name: Matthew Mcgrath ?MRN: [id number] ?DOB: [birth year] ?Today's Date: 06/20/2021 ? ?History of Present Illness ? Pt is a 76 y/o M admitted on 06/18/21 after presenting to the ED for eavluation of hematuria & associated back pain. Pt underwent cystoscopy on 4/13 with findings of a bladder tumor 2 to 3 cm in the posterior dome of the bladder. Pt is being treated for complicated UTI & sepsis. PMH: chronic systolic heart failure 2/2 cardiomyopathy s/p AICD, DM, HTN, metastatic prostate CA on chemotherapy  ?Clinical Impression ? Pt seen for PT evaluation with pt's sister Holley Raring) present in room. Prior to admission pt was residing with his sister in a 1 level home & mod I with QC without falls. On this date pt is able to complete bed mobility & STS with mod I with QC & ambulate 1 lap around nurses station with supervision & decreased gait speed. Pt & sister report pt is at [facility]. Pt does not demonstrate any acute PT needs. PT to complete current orders, please re-consult if new needs arise.   ?   ? ?Recommendations for follow up therapy are one component of a multi-disciplinary discharge planning process, led by the attending physician.  Recommendations may be updated based on patient status, additional functional criteria and insurance authorization. ? ?Follow Up Recommendations No PT follow up ? ?  ?Assistance Recommended at Discharge Frequent or constant Supervision/Assistance  ?Patient can return home with the following ? Assistance with cooking/housework ? ?  ?Equipment Recommendations None recommended by PT  ?Recommendations for Other Services ?    ?  ?Functional Status Assessment Patient has not had a recent decline in their functional status  ? ?  ?Precautions / Restrictions Precautions ?Precautions: Fall ?Restrictions ?Weight Bearing Restrictions: No  ? ?  ? ?Mobility ? Bed Mobility ?Overal bed mobility: Modified Independent ?  ?  ?  ?  ?  ?  ?General bed mobility  comments: supine>sit with HOB elevated & slightly extra time ?  ? ?Transfers ?Overall transfer level: Modified independent ?Equipment used: Quad cane ?  ?  ?  ?  ?  ?  ?  ?  ?  ? ?Ambulation/Gait ?Ambulation/Gait assistance: Supervision ?Gait Distance (Feet): 165 Feet ?Assistive device: Quad cane ?Gait Pattern/deviations: Decreased step length - right, Decreased step length - left, Decreased stride length ?Gait velocity: decreased ?  ?  ?General Gait Details: Pt ambulates with QC in RUE but will "roll" QC forward vs keeping all four prongs flat on floor with PT providing education on increased safety; anticipate pt has been ambulating in this manner for quite some time. ? ?Stairs ?  ?  ?  ?  ?  ? ?Wheelchair Mobility ?  ? ?Modified Rankin (Stroke Patients Only) ?  ? ?  ? ?Balance Overall balance assessment: Mild deficits observed, not formally tested ?Sitting-balance support: Feet supported ?Sitting balance-Leahy Scale: Good ?  ?  ?Standing balance support: During functional activity, Single extremity supported ?Standing balance-Leahy Scale: Good ?  ?  ?  ?  ?  ?  ?  ?  ?  ?  ?  ?  ?   ? ? ? ?Pertinent Vitals/Pain Pain Assessment ?Pain Assessment: No/denies pain  ? ? ?Home Living Family/patient expects to be discharged to:: Private residence ?Living Arrangements: Other relatives (sister) ?Available Help at Discharge: Family;Available 24 hours/day ?Type of Home: House ?Home Access:  (small threshold step to enter) ?  ?  ?  ?Home Layout: One  level ?  ?   ?  ?Prior Function Prior Level of Function : Independent/Modified Independent ?  ?  ?  ?  ?  ?  ?Mobility Comments: Ambulatory mod I with QC, denies falls, doesn't drive ?  ?  ? ? ?Hand Dominance  ?   ? ?  ?Extremity/Trunk Assessment  ? Upper Extremity Assessment ?Upper Extremity Assessment: Overall WFL for tasks assessed ?  ? ?Lower Extremity Assessment ?Lower Extremity Assessment: Overall WFL for tasks assessed ?  ? ?Cervical / Trunk Assessment ?Cervical / Trunk  Assessment: Kyphotic  ?Communication  ?    ?Cognition Arousal/Alertness: Awake/alert ?Behavior During Therapy: [facility] for tasks assessed/performed ?Overall Cognitive Status: Difficult to assess ?  ?  ?  ?  ?  ?  ?  ?  ?  ?  ?  ?  ?  ?  ?  ?  ?General Comments: Pt oriented to location [employer]" and states year is 2022), follows simple commands throughout session. ?  ?  ? ?  ?General Comments   ? ?  ?Exercises    ? ?Assessment/Plan  ?  ?PT Assessment Patient does not need any further PT services  ?PT Problem List   ? ?   ?  ?PT Treatment Interventions     ? ?PT Goals (Current goals can be found in the Care Plan section)  ?Acute Rehab PT Goals ?Patient Stated Goal: get better ?PT Goal Formulation: With patient/family ?Time For Goal Achievement: 07/04/21 ?Potential to Achieve Goals: Good ? ?  ?Frequency   ?  ? ? ?Co-evaluation   ?  ?  ?  ?  ? ? ?  ?AM-PAC PT "6 Clicks" Mobility  ?Outcome Measure Help needed turning from your back to your side while in a flat bed without using bedrails?: None ?Help needed moving from lying on your back to sitting on the side of a flat bed without using bedrails?: None ?Help needed moving to and from a bed to a chair (including a wheelchair)?: None ?Help needed standing up from a chair using your arms (e.g., wheelchair or bedside chair)?: None ?Help needed to walk in hospital room?: None ?Help needed climbing 3-5 steps with a railing? : A Little ?6 Click Score: 23 ? ?  ?End of Session   ?Activity Tolerance: Patient tolerated treatment well ?Patient left: with call bell/phone within reach;with bed alarm set;with family/visitor present (sitting EOB) ?Nurse Communication: Mobility status ?  ?  ? ?Time: 3532-9924 ?PT Time Calculation (min) (ACUTE ONLY): 12 min ? ? ?Charges:   PT Evaluation ?$PT Eval Low Complexity: 1 Low ?  ?  ?   ? ? ?Lavone Nian, PT, DPT ?06/20/21, 2:06 PM ? ? ?Waunita Schooner ?06/20/2021, 2:05 PM ? ?

## 2021-06-20 NOTE — Evaluation (Signed)
Occupational Therapy Evaluation ?Patient Details ?Name: Matthew Mcgrath ?MRN: [id number] ?DOB: 04-16-1945 ?Today's Date: 06/20/2021 ? ? ?History of Present Illness Pt is a 76 y/o M admitted on 06/18/21 after presenting to the ED for eavluation of hematuria & associated back pain. Pt underwent cystoscopy on 4/13 with findings of a bladder tumor 2 to 3 cm in the posterior dome of the bladder. Pt is being treated for complicated UTI & sepsis. PMH: chronic systolic heart failure 2/2 cardiomyopathy s/p AICD, DM, HTN, metastatic prostate CA on chemotherapy  ? ?Clinical Impression ?  ?Pt seen for OT evaluation this date. Prior to hospital admission, pt was modified independent in all aspects of ADL/IADL, using a SPC for functional mobility, and denies falls history in past 12 months. Pt lives with his sister in a 1 story home with a small threshold for entry. Currently pt reports feeling at or near baseline level of functional independence. Pt demonstrates baseline independence to perform LB dressing and mobility tasks and no strength, sensory, coordination, cognitive, or visual deficits appreciated with assessment. No skilled OT needs identified. Will sign off. Please re-consult if additional OT needs arise. ?  ?   ? ?Recommendations for follow up therapy are one component of a multi-disciplinary discharge planning process, led by the attending physician.  Recommendations may be updated based on patient status, additional functional criteria and insurance authorization.  ? ?Follow Up Recommendations ? No OT follow up  ?  ?Assistance Recommended at Discharge PRN  ?Patient can return home with the following   ? ?  ?Functional Status Assessment ?    ?Equipment Recommendations ?    ?  ?Recommendations for Other Services   ? ? ?  ?Precautions / Restrictions Precautions ?Precautions: Fall ?Restrictions ?Weight Bearing Restrictions: No  ? ?  ? ?Mobility Bed Mobility ?Overal bed mobility: Modified Independent ?  ?  ?  ?  ?  ?   ?General bed mobility comments: Deferred. Pt seated EOB at start/end of session. ?  ? ?Transfers ?Overall transfer level: Modified independent ?Equipment used: Quad cane ?  ?  ?  ?  ?  ?  ?  ?  ?  ? ?  ?Balance Overall balance assessment: Mild deficits observed, not formally tested ?Sitting-balance support: Feet supported ?Sitting balance-Leahy Scale: Good ?Sitting balance - Comments: steady static/dynamic sitting reaching outside BOS ?  ?Standing balance support: During functional activity, Single extremity supported ?Standing balance-Leahy Scale: Good ?Standing balance comment: Uses QC ?  ?  ?  ?  ?  ?  ?  ?  ?  ?  ?  ?   ? ?ADL either performed or assessed with clinical judgement  ? ?ADL Overall ADL's : At baseline ?  ?  ?  ?  ?  ?  ?  ?  ?  ?  ?  ?  ?  ?  ?  ?  ?  ?  ?  ?General ADL Comments: Pt presents at or near baseline level of functional independence for ADL management. He is able to doff/don bilat hospital socks with MOD I (increased time) and performs functional mobility in room using QC with MOD I.  ? ? ? ?Vision Patient Visual Report: No change from baseline ?   ?   ?Perception   ?  ?Praxis   ?  ? ?Pertinent Vitals/Pain Pain Assessment ?Pain Assessment: No/denies pain  ? ? ? ?Hand Dominance   ?  ?Extremity/Trunk Assessment Upper Extremity Assessment ?Upper Extremity Assessment: Overall  WFL for tasks assessed ?  ?Lower Extremity Assessment ?Lower Extremity Assessment: Overall WFL for tasks assessed ?  ?Cervical / Trunk Assessment ?Cervical / Trunk Assessment: Kyphotic ?  ?Communication Communication ?Communication: No difficulties ?  ?Cognition Arousal/Alertness: Awake/alert ?Behavior During Therapy: [facility] for tasks assessed/performed ?Overall Cognitive Status: Difficult to assess ?  ?  ?  ?  ?  ?  ?  ?  ?  ?  ?  ?  ?  ?  ?  ?  ?General Comments: Per sister, pt at baseline level of cognitive function. Does not elaborate on specifics of baseline deficits. Pt oriented to person, location, and situation. ?   ?  ?General Comments    ? ?  ?Exercises   ?  ?Shoulder Instructions    ? ? ?Home Living Family/patient expects to be discharged to:: Private residence ?Living Arrangements: Other relatives (Sister) ?Available Help at Discharge: Family;Available 24 hours/day ?Type of Home: House ?Home Access: Level entry ?  ?  ?Home Layout: One level ?  ?  ?  ?  ?  ?  ?  ?Home Equipment: Cane - quad ?  ?  ?  ? ?  ?Prior Functioning/Environment Prior Level of Function : Independent/Modified Independent ?  ?  ?  ?  ?  ?  ?Mobility Comments: Ambulatory mod I with QC, denies falls, doesn't drive ?ADLs Comments: MOD I for ADL management. Denies falls history in past year. ?  ? ?  ?  ?OT Problem List: Decreased strength;Decreased coordination;Decreased activity tolerance;Decreased safety awareness;Decreased knowledge of use of DME or AE ?  ?   ?OT Treatment/Interventions:    ?  ?OT Goals(Current goals can be found in the care plan section) Acute Rehab OT Goals ?Patient Stated Goal: To get stronger ?OT Goal Formulation: All assessment and education complete, DC therapy ?Time For Goal Achievement: 06/20/21 ?Potential to Achieve Goals: Good  ?OT Frequency:   ?  ? ?Co-evaluation   ?  ?  ?  ?  ? ?  ?AM-PAC OT "6 Clicks" Daily Activity     ?Outcome Measure Help from another person eating meals?: None ?Help from another person taking care of personal grooming?: None ?Help from another person toileting, which includes using toliet, bedpan, or urinal?: None ?Help from another person bathing (including washing, rinsing, drying)?: None ?Help from another person to put on and taking off regular upper body clothing?: None ?Help from another person to put on and taking off regular lower body clothing?: None ?6 Click Score: 24 ?  ?End of Session   ? ?Activity Tolerance: Patient tolerated treatment well ?Patient left: in bed;with bed alarm set;with call bell/phone within reach;with family/visitor present ? ?OT Visit Diagnosis: Other abnormalities of gait  and mobility (R26.89)  ?              ?Time: 1353-1405 ?OT Time Calculation (min): 12 min ?Charges:  OT General Charges ?$OT Visit: 1 Visit ?OT Evaluation ?$OT Eval Low Complexity: 1 Low ? ?Shara Blazing, M.S., OTR/L ?Feeding Team - [facility] ?Ascom: [phone] ?06/20/21, 3:10 PM ? ?

## 2021-06-20 NOTE — Progress Notes (Signed)
?PROGRESS NOTE ? ? ? ?Matthew Mcgrath  WFU:[id number] DOB: October 01, 1945 DOA: 06/18/2021 ?PCP: [employer]  ? ?Brief Narrative:  ?Per HPI: ?Matthew Mcgrath is a 76 y.o. 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  Patient was being worked up for hematuria by his urologist and underwent cystoscopy on 4/13 with findings of a bladder tumor 2 to 3 cm in the posterior dome of the bladder.  He denies abdominal pain, fever or chills. ?ED course and data review: On arrival febrile to 100.8, pulse 112 with BP 139/93 blood work with WBC 7500 and hemoglobin 11, CMP unremarkable, urinalysis with moderate hemoglobin and otherwise consistent with UTI.  Lactic acid 1.9.  COVID and flu negative.  EKG, personally viewed and interpreted: Sinus at 95 with nonspecific ST-T wave changes.  CT abdomen and pelvis with contrast shows the following: ?IMPRESSION: ?Interval development of mild circumferential bowel wall thickening ?and perivesicular inflammatory stranding. Correlation with ?urinalysis and urine culture may be helpful to exclude a ?superimposed infectious or inflammatory process. Superimposed ?lobulated hyperdense material with interstitial gas dependently ?within the bladder lumen is in keeping with intraluminal clot. No ?extraluminal gas or fluid identified to suggest perforation. ?  ?Extensive coronary artery calcification. Peripheral vascular ?disease. If there is clinical evidence of hemodynamically ?significant renal artery stenosis or chronic mesenteric ischemia, CT ?arteriography may be helpful for further evaluation. ?  ?The ED provider spoke with on-call urologist, Dr. Hollice Espy who advised to treat sepsis and admit to medical service.  Hospitalist consulted for admission. ? ?Hosp day 2 ? ? ?Assessment &  Plan: ?  ?Principal Problem: ?  Complicated UTI (urinary tract infection) ?Active Problems: ?  Sepsis ([place]) ?  Gross hematuria ?  Prostate cancer ([place]) ?  Essential (primary) hypertension ?  Chronic systolic heart failure ([place]) ?  Cardiomyopathy ([place]) ?  ICD (implantable cardioverter-defibrillator) in place ?  Type 2 diabetes mellitus without complications ([place]) ? ? ?Complicated UTI (urinary tract infection) s/p recent cysto with new bladder tumor ?Patient presenting with sepsis secondary to UTI following cystoscopy procedure the day prior indicated for gross hematuria ?Cefepime and follow cultures-urine culture shows proteus and e coli, sens pending ?Blood cultures negative times 2 x 2 day ?Treat sepsis as outlined below until physiology resolves ?  ?Sepsis ([place]) ?Sepsis criteria includes fever tachycardia and source of infection ?Continue sepsis fluids and monitor for fluid overload in view of systolic heart failure and cardiomyopathy ?Cefepime as above ? ?Debility: ?-family hesitant to bring pt home currently reporting significant weakness ?-PT/OT for recs ?  ?Gross hematuria ?Bladder tumor seen on cystoscopy on 4/13 ?Urology consulted-discussed the case with them no need for acute intervention ?Intervention contraindicated in the setting of infection ?Monitor H&H ?Planned intervention with urology next Friday per pt brother ?  ?Prostate cancer ([place]) ?No acute issues.  Urology consulted as above ?  ?Type 2 diabetes mellitus without complications ([place]) ?Sliding scale insulin coverage ?  ?ICD (implantable cardioverter-defibrillator) in place ?No acute issues.  Management as above ?  ?Cardiomyopathy ([place]) ?Management as above ?  ?Chronic systolic heart failure ([place]) ?Continue lisinopril and carvedilol as well as spironolactone and furosemide ?  ?Essential (primary) hypertension ?Continue lisinopril and carvedilol ?  ?DVT prophylaxis: SCD/Compression stockings  ?Code Status: FULL ? ?  ?Code Status Orders  ?(From  admission,  onward)  ?  ? ? ?  ? ?  Start     Ordered  ? 06/19/21 0101  Full code  Continuous       ? 06/19/21 0102  ? ?  ?  ? ?  ? ?Code Status History   ? ? Date Active Date Inactive Code Status Order ID Comments User Context  ? 07/03/2015 1850 07/04/2015 1523 Full Code [id number]  Marzetta Board, MD Inpatient  ? ?  ? ?Family Communication: discussed with patients sister  ?Disposition Plan:    Patient remains on IV antibiotics and is not yet stable for discharge ?Consults called: None ?Admission status: Inpatient ? ? ?Consultants:  ?None ? ?Procedures:  ?DG Chest 2 View ? ?Result Date: 06/18/2021 ?CLINICAL DATA:  Fever, tachycardia EXAM: CHEST - 2 VIEW COMPARISON:  07/04/2015 FINDINGS: Frontal and lateral views of the chest demonstrate stable AICD. Cardiac silhouette is unremarkable. Linear opacities at the lung bases, left greater than right, most consistent with scarring or subsegmental atelectasis. No acute airspace disease, effusion, or pneumothorax. No acute bony abnormalities. IMPRESSION: 1. Bilateral lower lobe subsegmental atelectasis or scarring. No acute airspace disease. Electronically Signed   By: Randa Ngo M.D.   On: 06/18/2021 22:28  ? ?CT ABDOMEN PELVIS W CONTRAST ? ?Result Date: 06/18/2021 ?CLINICAL DATA:  Abdominal pain, acute, nonlocalized. Hematuria with clots following cystoscopy. EXAM: CT ABDOMEN AND PELVIS WITH CONTRAST TECHNIQUE: Multidetector CT imaging of the abdomen and pelvis was performed using the standard protocol following bolus administration of intravenous contrast. RADIATION DOSE REDUCTION: This exam was performed according to the departmental dose-optimization program which includes automated exposure control, adjustment of the mA and/or kV according to patient size and/or use of iterative reconstruction technique. CONTRAST:  13m OMNIPAQUE IOHEXOL 300 MG/ML  SOLN COMPARISON:  None. FINDINGS: Lower chest: Mild bibasilar atelectasis or scarring. Extensive coronary artery  calcification. Pacemaker leads noted within the right heart. Small hiatal hernia. Hepatobiliary: No focal liver abnormality is seen. No gallstones, gallbladder wall thickening, or biliary dilatation. Pancreas: Unremarkable Spleen: Unremarkable Adrenals/Urinary Tract: The adrenal glands are unremarkable. The kidneys are normal in size and position. No hydronephrosis. No enhancing intrarenal masses. No intrarenal or ureteral calculi. The bladder is circumferentially thick walled and there is mild perivesicular inflammatory stranding,. There is nondependent gas within the lumen as well as lobulated hyperdense material containing interstitial gas dependently within the bladder lumen in keeping with blood clot. No extraluminal gas or fluid collection identified. Stomach/Bowel: Stomach is within normal limits. Appendix appears normal. No evidence of bowel wall thickening, distention, or inflammatory changes. Vascular/Lymphatic: Extensive atherosclerotic calcification and moderate atheromatous plaque is seen within the aortoiliac vasculature. No aortic aneurysm. Particularly prominent atherosclerotic calcification at the origin of the mesenteric and renal vasculature may result in hemodynamically significant stenosis, though this is not well assessed on this non arteriographic study. No pathologic adenopathy within the abdomen and pelvis. Reproductive: Prostate is unremarkable. Other: Small, shallow, broad-based fat containing umbilical hernia. Probable right inguinal hernia repair Musculoskeletal: No acute bone abnormality. Degenerative changes are seen within the lumbar spine. IMPRESSION: Interval development of mild circumferential bowel wall thickening and perivesicular inflammatory stranding. Correlation with urinalysis and urine culture may be helpful to exclude a superimposed infectious or inflammatory process. Superimposed lobulated hyperdense material with interstitial gas dependently within the bladder lumen is  in keeping with intraluminal clot. No extraluminal gas or fluid identified to suggest perforation. Extensive coronary artery calcification. Peripheral vascular disease. If there is clinical evidence of hemodynamicall

## 2021-06-21 DIAGNOSIS — A419 Sepsis, unspecified organism: Secondary | ICD-10-CM | POA: Diagnosis not present

## 2021-06-21 DIAGNOSIS — D649 Anemia, unspecified: Secondary | ICD-10-CM

## 2021-06-21 DIAGNOSIS — R310 Gross hematuria: Secondary | ICD-10-CM | POA: Diagnosis not present

## 2021-06-21 DIAGNOSIS — C61 Malignant neoplasm of prostate: Secondary | ICD-10-CM | POA: Diagnosis not present

## 2021-06-21 DIAGNOSIS — N390 Urinary tract infection, site not specified: Secondary | ICD-10-CM | POA: Diagnosis not present

## 2021-06-21 LAB — CBC WITH DIFFERENTIAL/PLATELET
Abs Immature Granulocytes: 0.03 10*3/uL (ref 0.00–0.07)
Basophils Absolute: 0 10*3/uL (ref 0.0–0.1)
Basophils Relative: 0 %
Eosinophils Absolute: 0.1 10*3/uL (ref 0.0–0.5)
Eosinophils Relative: 1 %
HCT: 30.1 % — ABNORMAL LOW (ref 39.0–52.0)
Hemoglobin: 9.7 g/dL — ABNORMAL LOW (ref 13.0–17.0)
Immature Granulocytes: 1 %
Lymphocytes Relative: 9 %
Lymphs Abs: 0.5 10*3/uL — ABNORMAL LOW (ref 0.7–4.0)
MCH: 29.6 pg (ref 26.0–34.0)
MCHC: 32.2 g/dL (ref 30.0–36.0)
MCV: 91.8 fL (ref 80.0–100.0)
Monocytes Absolute: 0.5 10*3/uL (ref 0.1–1.0)
Monocytes Relative: 8 %
Neutro Abs: 4.8 10*3/uL (ref 1.7–7.7)
Neutrophils Relative %: 81 %
Platelets: 191 10*3/uL (ref 150–400)
RBC: 3.28 MIL/uL — ABNORMAL LOW (ref 4.22–5.81)
RDW: 13.6 % (ref 11.5–15.5)
WBC: 5.9 10*3/uL (ref 4.0–10.5)
nRBC: 0 % (ref 0.0–0.2)

## 2021-06-21 LAB — BASIC METABOLIC PANEL
Anion gap: 9 (ref 5–15)
BUN: 18 mg/dL (ref 8–23)
CO2: 25 mmol/L (ref 22–32)
Calcium: 8.8 mg/dL — ABNORMAL LOW (ref 8.9–10.3)
Chloride: 102 mmol/L (ref 98–111)
Creatinine, Ser: 0.67 mg/dL (ref 0.61–1.24)
GFR, Estimated: >60 mL/min (ref >60)
Glucose, Bld: 135 mg/dL — ABNORMAL HIGH (ref 70–99)
Potassium: 3.4 mmol/L — ABNORMAL LOW (ref 3.5–5.1)
Sodium: 136 mmol/L (ref 135–145)

## 2021-06-21 LAB — GLUCOSE, CAPILLARY
Glucose-Capillary: 153 mg/dL — ABNORMAL HIGH (ref 70–99)
Glucose-Capillary: 123 mg/dL — ABNORMAL HIGH (ref 70–99)

## 2021-06-21 LAB — CYTOLOGY - NON PAP

## 2021-06-21 MED ORDER — POTASSIUM CHLORIDE CRYS ER 10 MEQ PO TBCR: 10.0000 meq | EXTENDED_RELEASE_TABLET | Freq: Every day | ORAL | Status: DC

## 2021-06-21 MED ORDER — SPIRONOLACTONE 25 MG PO TABS: 12.5000 mg | ORAL_TABLET | Freq: Every day | ORAL | Status: DC

## 2021-06-21 MED ORDER — LISINOPRIL 5 MG PO TABS: 2.5000 mg | ORAL_TABLET | Freq: Every day | ORAL | Status: DC

## 2021-06-21 MED ORDER — CARVEDILOL 6.25 MG PO TABS: 6.2500 mg | ORAL_TABLET | Freq: Two times a day (BID) | ORAL | Status: DC

## 2021-06-21 MED ORDER — CEPHALEXIN 500 MG PO CAPS: 500.0000 mg | ORAL_CAPSULE | Freq: Three times a day (TID) | ORAL | 0 refills | Status: AC

## 2021-06-21 NOTE — Discharge Summary (Signed)
?Physician Discharge Summary ?  ?Patient: Matthew Mcgrath MRN: [id number] DOB: 1945-08-05  ?Admit date:     06/18/2021  ?Discharge date: 06/21/21  ?Discharge Physician: Loletha Grayer  ? ?PCP: [employer]  ? ?Recommendations at discharge:  ? ?[facility] ?Urine culture sensitivity still pending and should be resulted by 06/22/2021. ? ?Discharge Diagnoses: ?Principal Problem: ?  Complicated UTI (urinary tract infection) ?Active Problems: ?  Sepsis ([place]) ?  Gross hematuria ?  Prostate cancer ([place]) ?  Essential (primary) hypertension ?  Chronic systolic heart failure ([place]) ?  Cardiomyopathy ([place]) ?  ICD (implantable cardioverter-defibrillator) in place ?  Type 2 diabetes mellitus without complications ([place]) ? ? ? ?Hospital Course: ?The patient was admitted to the hospital on 06/19/2021 and discharged on 06/21/2021.  The patient initially came in with weaknessThe patient initially came in with weakness.  He recently had a cystoscopy and found to have a bladder tumor.  Patient was found to have a positive urine analysis.Both E. coli and Proteus growing out of the urine cultures.  Patient was on Maxipime while here in the hospital.  We will switch over to Keflex upon disposition. I spoke with the microbiology lab and hopefully sensitivities will be back on 06/22/2021.The patient did well with physical therapy.  Patient was feeling better than when he came in. ? ?Assessment and Plan: ?* Complicated UTI (urinary tract infection) ?Patient presenting with sepsis secondary to UTI following cystoscopy. With E. coli and Proteus growing out of the urine we did give Maxipime initially while here in the hospital.  We will switch over to Keflex upon disposition.  Sensitivities should be back on 06/22/2021. ? ?Sepsis ([place]) ?Sepsis criteria includes fever tachycardia and source of infection.  The patient is afebrile. ? ?Gross hematuria ?Bladder tumor seen on cystoscopy.  Will need outpatient follow-up with  urology.  Holding aspirin at this time. ? ?Prostate cancer ([place]) ?No acute issues.  Follow-up with urology as outpatient ? ?Type 2 diabetes mellitus without complications ([place]) ?Can go back on metformin and Jardiance as outpatient ? ?ICD (implantable cardioverter-defibrillator) in place ?No acute issues.  Management as above ? ?Cardiomyopathy ([place]) ?Management as above ? ?Chronic systolic heart failure ([place]) ?Continue patient on lisinopril Coreg and spironolactone. ? ?Essential (primary) hypertension ?Continue lisinopril and carvedilol ? ?Anemia. combination of anemia of chronic disease and acute blood loss anemia.  Hold aspirin.  Last hemoglobin 9.7.  Continue to follow-up as outpatient ? ? ? ? ?  ? ? ?Consultants: None ?Procedures performed: None ?Disposition: Home ?Diet recommendation:  ?Cardiac and Carb modified diet ?DISCHARGE MEDICATION: ?Allergies as of 06/21/2021   ?No Known Allergies ?  ? ?  ?Medication List  ?  ? ?STOP taking these medications   ? ?aspirin EC 81 MG tablet ?  ?darolutamide 300 MG tablet ?Commonly known as: NUBEQA ?  ?furosemide 40 MG tablet ?Commonly known as: LASIX ?  ?glipiZIDE 5 MG tablet ?Commonly known as: GLUCOTROL ?  ?potassium chloride 10 MEQ tablet ?Commonly known as: KLOR-CON ?  ?simvastatin 40 MG tablet ?Commonly known as: ZOCOR ?  ? ?  ? ?TAKE these medications   ? ?acetaminophen 650 MG CR tablet ?Commonly known as: TYLENOL ?Take 650 mg by mouth every 8 (eight) hours as needed for pain. ?  ?carvedilol 6.25 MG tablet ?Commonly known as: COREG ?Take 6.25 mg by mouth 2 (two) times daily. ?  ?cephALEXin 500 MG capsule ?Commonly known as: KEFLEX ?Take 1 capsule (500 mg total)  by mouth 3 (three) times daily for 7 days. ?  ?cholecalciferol 25 MCG (1000 UNIT) tablet ?Commonly known as: VITAMIN D3 ?Take 1,000 Units by mouth daily. ?  ?DHA OMEGA 3 PO ?1,000 mg. ?  ?Glumetza 1000 MG (MOD) 24 hr tablet ?Generic drug: metFORMIN ?Take 1,000 mg by mouth daily. ?What changed: Another medication  with the same name was removed. Continue taking this medication, and follow the directions you see here. ?  ?Jardiance 10 MG Tabs tablet ?Generic drug: empagliflozin ?Take 10 mg by mouth daily. ?  ?leuprolide acetate (6 Month) 45 MG injection ?Generic drug: leuprolide (6 Month) ?Inject into the skin. ?  ?Lidoderm 5 % ?Generic drug: lidocaine ?Apply 1 patch to skin every morning as needed for L lower back pain. Remove after 12 hours. Call for refills ?  ?lisinopril 2.5 MG tablet ?Commonly known as: ZESTRIL ?Take 2.5 mg by mouth daily. ?What changed: Another medication with the same name was removed. Continue taking this medication, and follow the directions you see here. ?  ?magnesium oxide 400 (240 Mg) MG tablet ?Commonly known as: MAG-OX ?Take 1 tablet by mouth 2 (two) times daily. ?  ?MiraLax 17 GM/SCOOP powder ?Generic drug: polyethylene glycol powder ?Take 17 g by mouth daily. ?  ?MULTIVITAMIN ADULT PO ?SMARTSIG:1 Tablet(s) By Mouth Daily ?  ?Omega III EPA+DHA 1000 MG Caps ?Take 1 capsule by mouth 2 (two) times daily. ?  ?spironolactone 25 MG tablet ?Commonly known as: ALDACTONE ?Take 0.5 tablets (12.5 mg total) by mouth daily. ?  ?tamsulosin 0.4 MG Caps capsule ?Commonly known as: FLOMAX ?Take 1 capsule (0.4 mg total) by mouth daily. ?  ?True Metrix Blood Glucose Test test strip ?Generic drug: glucose blood ?1 each by Other route as needed for other. Use as instructed ?  ?TRUEplus Lancets 28G Misc ?by Does not apply route. ?  ? ?  ? ? Follow-up Information   ? ? [employer] Follow up in 5 day(s).   ?Contact information: ?[address] ?[place] [address] ?[phone] ? ? ?  ?  ? ? Billey Co, MD Follow up.   ?Specialty: Urology ?Why: follow up for bladder tumor ?Contact information: ?[place] [address] ?[phone] ? ? ?  ?  ? ?  ?  ? ?  ? ?Discharge Exam: ?Filed Weights  ? 06/19/21 0040  ?Weight: 87.2 kg  ? ?Physical Exam ?HENT:  ?   Head: Normocephalic.  ?    Mouth/Throat:  ?   Pharynx: No oropharyngeal exudate.  ?Cardiovascular:  ?   Rate and Rhythm: Normal rate and regular rhythm.  ?   Heart sounds: Normal heart sounds, S1 normal and S2 normal.  ?Pulmonary:  ?   Breath sounds: No decreased breath sounds, wheezing, rhonchi or rales.  ?Abdominal:  ?   Palpations: Abdomen is soft.  ?   Tenderness: There is no abdominal tenderness.  ?Musculoskeletal:  ?   Right lower leg: No swelling.  ?   Left lower leg: No swelling.  ?Skin: ?   General: Skin is warm.  ?   Findings: No rash.  ?Neurological:  ?   Mental Status: He is alert.  ?  ? ?Condition at discharge: stable ? ?The results of significant diagnostics from this hospitalization (including imaging, microbiology, ancillary and laboratory) are listed below for reference.  ? ?Imaging Studies: ?DG Chest 2 View ? ?Result Date: 06/18/2021 ?CLINICAL DATA:  Fever, tachycardia EXAM: CHEST - 2 VIEW COMPARISON:  07/04/2015 FINDINGS: Frontal and lateral views of the chest demonstrate stable AICD. Cardiac silhouette is unremarkable. Linear opacities at the lung bases, left greater than right, most consistent with scarring or subsegmental atelectasis. No acute airspace disease, effusion, or pneumothorax. No acute bony abnormalities. IMPRESSION: 1. Bilateral lower lobe subsegmental atelectasis or scarring. No acute airspace disease. Electronically Signed   By: Randa Ngo M.D.   On: 06/18/2021 22:28  ? ?CT ABDOMEN PELVIS W CONTRAST ? ?Result Date: 06/18/2021 ?CLINICAL DATA:  Abdominal pain, acute, nonlocalized. Hematuria with clots following cystoscopy. EXAM: CT ABDOMEN AND PELVIS WITH CONTRAST TECHNIQUE: Multidetector CT imaging of the abdomen and pelvis was performed using the standard protocol following bolus administration of intravenous contrast. RADIATION DOSE REDUCTION: This exam was performed according to the departmental dose-optimization program which includes automated exposure control, adjustment of the mA and/or kV according  to patient size and/or use of iterative reconstruction technique. CONTRAST:  132m OMNIPAQUE IOHEXOL 300 MG/ML  SOLN COMPARISON:  None. FINDINGS: Lower chest: Mild bibasilar atelectasis or scarring.

## 2021-06-21 NOTE — TOC Initial Note (Signed)
Transition of Care (TOC) - Initial/Assessment Note  ? ? ?Patient Details  ?Name: Matthew Mcgrath ?MRN: [id number] ?Date of Birth: [birth year] ? ?Transition of Care (TOC) CM/SW Contact:    ?Pete Pelt, RN ?Phone Number: ?06/21/2021, 11:24 AM ? ?Clinical Narrative:  Patient lives with sister.  Patient and sister state no complaints with medications or with transportation.  Physcial and Occupational Therapy have no follow up needs for patient. [facility]  [facility] will arrange transport with patient's care nurse.               ? ? ?Expected Discharge Plan: Home/Self Care ([facility] is following patient.) ?Barriers to Discharge: Continued Medical Work up ? ? ?Patient Goals and CMS Choice ?  ?  ?Choice offered to / list presented to : NA ? ?Expected Discharge Plan and Services ?Expected Discharge Plan: Home/Self Care ([facility] is following patient.) ?  ?  ?  ?Living arrangements for the past 2 months: [place] ?Expected Discharge Date: 06/21/21               ?  ?  ?  ?  ?  ?  ?  ?  ?  ?  ? ?Prior Living Arrangements/Services ?Living arrangements for the past 2 months: [place] ?Lives with:: Self, Relatives ?Patient language and need for interpreter reviewed:: Yes (No interpreter needed, memory impairment, spoke to sister) ?       ?Need for Family Participation in Patient Care: Yes (Comment) ?Care giver support system in place?: Yes (comment) ?Current home services:  ([facility]) ?Criminal Activity/Legal Involvement Pertinent to Current Situation/Hospitalization: No - Comment as needed ? ?Activities of Daily Living ?Home Assistive Devices/Equipment: Cane (specify quad or straight), Eyeglasses, Shower chair with back ?ADL Screening (condition at time of admission) ?Patient's cognitive ability adequate to safely complete daily activities?: No ?Is the patient deaf or have difficulty hearing?: No ?Does the patient have difficulty seeing, even when wearing glasses/contacts?:  No ?Does the patient have difficulty concentrating, remembering, or making decisions?: Yes ?Patient able to express need for assistance with ADLs?: No ?Does the patient have difficulty dressing or bathing?: Yes ?Independently performs ADLs?: No ?Does the patient have difficulty walking or climbing stairs?: Yes ?Weakness of Legs: Both ?Weakness of Arms/Hands: None ? ?Permission Sought/Granted ?Permission sought to share information with : Case Manager ?Permission granted to share information with : Yes, Verbal Permission Granted ?   ? Permission granted to share info w AGENCY: [facility] ?   ?   ? ?Emotional Assessment ?Appearance:: Appears stated age ?Attitude/Demeanor/Rapport: Gracious, Engaged ?Affect (typically observed): Pleasant, Appropriate ?Orientation: : Oriented to Self, Oriented to Place ?Alcohol / Substance Use: Not Applicable ?Psych Involvement: No (comment) ? ?Admission diagnosis:  Acute cystitis with hematuria [N30.01] ?Complicated UTI (urinary tract infection) [N39.0] ?Sepsis, due to unspecified organism, unspecified whether acute organ dysfunction present ([place]) [A41.9] ?Patient Active Problem List  ? Diagnosis Date Noted  ? Sepsis ([place]) 06/19/2021  ? Gross hematuria 06/19/2021  ? Complicated UTI (urinary tract infection) 06/18/2021  ? 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  ? Genetic testing 02/22/2021  ? Family history of colon cancer 01/13/2021  ? Family history of breast cancer 01/13/2021  ? Encounter for antineoplastic chemotherapy 07/06/2020  ? Metastatic cancer to bone ([place]) 07/06/2020  ? Prostate cancer metastatic to bone ([place]) 05/28/2020  ? Prostate cancer ([place]) 05/11/2020  ? Goals of care, counseling/discussion 05/11/2020  ? Iliac artery aneurysm ([place]) 04/12/2016  ? 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  ? Cardiomyopathy ([place]) 04/24/2015  ? S/P cardiac catheterization 04/24/2015  ? Chest pain 12/23/2014  ? Diabetes ([place]) 11/21/2014  ? Chronic systolic heart failure  ([place]) 11/20/2014  ? Tobacco abuse 11/20/2014  ? Pedal edema 11/03/2014  ? Acute on chronic combined systolic and diastolic congestive heart failure ([place]) 09/20/2014  ? Elevation of level of transaminase and lactic acid dehydrogenase (LDH) 09/20/2014  ? Essential (primary) hypertension 05/02/2014  ? Hyperlipidemia 05/02/2014  ? Shortness of breath 05/02/2014  ? Urinary tract infection 05/02/2014  ? Type 2 diabetes mellitus without complications ([place]) 30/14/9969  ? ?PCP:  [facility] [employer] ?Pharmacy:   ?[place], [address] [place] ?[address] ?[place] [address] ?Phone: [phone] Fax: [phone] ? ? ? ? ?Social Determinants of Health (SDOH) Interventions ?  ? ?Readmission Risk Interventions ? ?  06/21/2021  ? 11:22 AM  ?Readmission Risk Prevention Plan  ?Transportation Screening Complete  ?PCP or Specialist Appt within 5-7 Days Complete  ?Home Care Screening Complete  ?Medication Review (RN CM) Complete  ? ? ? ?

## 2021-06-22 ENCOUNTER — Encounter
Admission: RE | Admit: 2021-06-22 | Discharge: 2021-06-22 | Disposition: A | Discharge status: Home / Self Care | Payer: Medicare (Managed Care) | Source: Ambulatory Visit | Attending: Urology | Admitting: Urology

## 2021-06-22 LAB — URINE CULTURE: Culture: >=100000 — AB

## 2021-06-22 NOTE — Pre-Procedure Instructions (Signed)
Medications and history reviewed with [facility] provider. Instructions provided. Understanding verbalized. Request for cardiology clearance sent to [facility]. ICD form sent to [facility] cardiology.

## 2021-06-23 LAB — CULTURE, BLOOD (ROUTINE X 2)
Culture: NO GROWTH
Culture: NO GROWTH
Special Requests: ADEQUATE

## 2021-06-24 NOTE — Pre-Procedure Instructions (Addendum)
Cardiac Clearance on chart from [facility] December Risk-MD does ask that cardiology interrogate ICD after surgery ? ?Pacemaker form filled out and on chart  ?

## 2021-07-01 MED ORDER — CHLORHEXIDINE GLUCONATE 0.12 % MT SOLN: 15.0000 mL | Freq: Once | OROMUCOSAL | Status: AC

## 2021-07-01 MED ORDER — CEFAZOLIN SODIUM-DEXTROSE 2-4 GM/100ML-% IV SOLN
2.0000 g | INTRAVENOUS | Status: AC
  Administered 2021-07-02: 2 g via INTRAVENOUS

## 2021-07-01 MED ORDER — ORAL CARE MOUTH RINSE: 15.0000 mL | Freq: Once | OROMUCOSAL | Status: AC

## 2021-07-01 MED ORDER — SODIUM CHLORIDE 0.9 % IV SOLN
INTRAVENOUS | Status: DC
Start: 2021-07-01 — End: 2021-07-02

## 2021-07-01 MED ORDER — FAMOTIDINE 20 MG PO TABS: 20.0000 mg | ORAL_TABLET | Freq: Once | ORAL | Status: AC

## 2021-07-02 ENCOUNTER — Encounter
Admission: RE | Disposition: A | Discharge status: Home / Self Care | Payer: Self-pay | Source: Home / Self Care | Attending: Urology

## 2021-07-02 ENCOUNTER — Other Ambulatory Visit: Payer: Self-pay

## 2021-07-02 ENCOUNTER — Ambulatory Visit: Payer: Medicare (Managed Care) | Admitting: Registered Nurse

## 2021-07-02 ENCOUNTER — Encounter: Payer: Self-pay | Admitting: Urology

## 2021-07-02 ENCOUNTER — Ambulatory Visit
Admission: RE | Admit: 2021-07-02 | Discharge: 2021-07-02 | Disposition: A | Discharge status: Home / Self Care | Payer: Medicare (Managed Care) | Attending: Urology | Admitting: Urology

## 2021-07-02 ENCOUNTER — Ambulatory Visit: Payer: Medicare (Managed Care)

## 2021-07-02 DIAGNOSIS — Z803 Family history of malignant neoplasm of breast: Secondary | ICD-10-CM | POA: Diagnosis not present

## 2021-07-02 DIAGNOSIS — D494 Neoplasm of unspecified behavior of bladder: Secondary | ICD-10-CM | POA: Diagnosis present

## 2021-07-02 DIAGNOSIS — C61 Malignant neoplasm of prostate: Secondary | ICD-10-CM | POA: Insufficient documentation

## 2021-07-02 DIAGNOSIS — E119 Type 2 diabetes mellitus without complications: Secondary | ICD-10-CM | POA: Insufficient documentation

## 2021-07-02 DIAGNOSIS — I110 Hypertensive heart disease with heart failure: Secondary | ICD-10-CM | POA: Diagnosis not present

## 2021-07-02 DIAGNOSIS — Z87891 Personal history of nicotine dependence: Secondary | ICD-10-CM | POA: Insufficient documentation

## 2021-07-02 DIAGNOSIS — I509 Heart failure, unspecified: Secondary | ICD-10-CM | POA: Insufficient documentation

## 2021-07-02 DIAGNOSIS — Z7984 Long term (current) use of oral hypoglycemic drugs: Secondary | ICD-10-CM | POA: Diagnosis not present

## 2021-07-02 DIAGNOSIS — Z800 Family history of malignant neoplasm of digestive organs: Secondary | ICD-10-CM | POA: Insufficient documentation

## 2021-07-02 DIAGNOSIS — C679 Malignant neoplasm of bladder, unspecified: Secondary | ICD-10-CM | POA: Insufficient documentation

## 2021-07-02 DIAGNOSIS — I429 Cardiomyopathy, unspecified: Secondary | ICD-10-CM | POA: Diagnosis not present

## 2021-07-02 DIAGNOSIS — C671 Malignant neoplasm of dome of bladder: Secondary | ICD-10-CM | POA: Diagnosis not present

## 2021-07-02 HISTORY — PX: TRANSURETHRAL RESECTION OF BLADDER TUMOR: SHX2575

## 2021-07-02 HISTORY — PX: CYSTOSCOPY W/ RETROGRADES: SHX1426

## 2021-07-02 LAB — GLUCOSE, CAPILLARY
Glucose-Capillary: 137 mg/dL — ABNORMAL HIGH (ref 70–99)
Glucose-Capillary: 126 mg/dL — ABNORMAL HIGH (ref 70–99)

## 2021-07-02 SURGERY — TURBT (TRANSURETHRAL RESECTION OF BLADDER TUMOR)
Anesthesia: General | Site: Bladder

## 2021-07-02 MED ORDER — EPHEDRINE 5 MG/ML INJ
INTRAVENOUS | Status: AC
  Filled 2021-07-02: qty 5

## 2021-07-02 MED ORDER — ONDANSETRON HCL 4 MG/2ML IJ SOLN
INTRAMUSCULAR | Status: AC
  Filled 2021-07-02: qty 2

## 2021-07-02 MED ORDER — FENTANYL CITRATE (PF) 100 MCG/2ML IJ SOLN
INTRAMUSCULAR | Status: DC | PRN
  Administered 2021-07-02 (×2): 50 ug via INTRAVENOUS

## 2021-07-02 MED ORDER — ROCURONIUM BROMIDE 100 MG/10ML IV SOLN
INTRAVENOUS | Status: DC | PRN
  Administered 2021-07-02: 20 mg via INTRAVENOUS
  Administered 2021-07-02: 50 mg via INTRAVENOUS

## 2021-07-02 MED ORDER — ROCURONIUM BROMIDE 10 MG/ML (PF) SYRINGE
PREFILLED_SYRINGE | INTRAVENOUS | Status: AC
  Filled 2021-07-02: qty 10

## 2021-07-02 MED ORDER — FAMOTIDINE 20 MG PO TABS
ORAL_TABLET | ORAL | Status: AC
  Administered 2021-07-02: 20 mg via ORAL
  Filled 2021-07-02: qty 1

## 2021-07-02 MED ORDER — LIDOCAINE HCL (PF) 2 % IJ SOLN
INTRAMUSCULAR | Status: AC
  Filled 2021-07-02: qty 5

## 2021-07-02 MED ORDER — FENTANYL CITRATE (PF) 100 MCG/2ML IJ SOLN
INTRAMUSCULAR | Status: AC
  Filled 2021-07-02: qty 2

## 2021-07-02 MED ORDER — ONDANSETRON HCL 4 MG/2ML IJ SOLN: 4.0000 mg | Freq: Once | INTRAMUSCULAR | Status: DC | PRN

## 2021-07-02 MED ORDER — CEFAZOLIN SODIUM-DEXTROSE 2-4 GM/100ML-% IV SOLN
INTRAVENOUS | Status: AC
  Filled 2021-07-02: qty 100

## 2021-07-02 MED ORDER — IOHEXOL 180 MG/ML  SOLN
INTRAMUSCULAR | Status: DC | PRN
  Administered 2021-07-02: 10 mL

## 2021-07-02 MED ORDER — SUGAMMADEX SODIUM 200 MG/2ML IV SOLN
INTRAVENOUS | Status: DC | PRN
  Administered 2021-07-02: 200 mg via INTRAVENOUS

## 2021-07-02 MED ORDER — PROPOFOL 10 MG/ML IV BOLUS
INTRAVENOUS | Status: DC | PRN
  Administered 2021-07-02: 100 mg via INTRAVENOUS

## 2021-07-02 MED ORDER — PROPOFOL 10 MG/ML IV BOLUS
INTRAVENOUS | Status: AC
  Filled 2021-07-02: qty 20

## 2021-07-02 MED ORDER — SUCCINYLCHOLINE CHLORIDE 200 MG/10ML IV SOSY: PREFILLED_SYRINGE | INTRAVENOUS | Status: DC | PRN

## 2021-07-02 MED ORDER — SODIUM CHLORIDE 0.9 % IR SOLN
Status: DC | PRN
  Administered 2021-07-02: 3000 mL

## 2021-07-02 MED ORDER — PHENYLEPHRINE 80 MCG/ML (10ML) SYRINGE FOR IV PUSH (FOR BLOOD PRESSURE SUPPORT)
PREFILLED_SYRINGE | INTRAVENOUS | Status: AC
  Filled 2021-07-02: qty 10

## 2021-07-02 MED ORDER — PHENYLEPHRINE HCL (PRESSORS) 10 MG/ML IV SOLN
INTRAVENOUS | Status: DC | PRN
Start: 2021-07-02 — End: 2021-07-02
  Administered 2021-07-02: 80 ug via INTRAVENOUS
  Administered 2021-07-02 (×2): 160 ug via INTRAVENOUS
  Administered 2021-07-02: 240 ug via INTRAVENOUS

## 2021-07-02 MED ORDER — CHLORHEXIDINE GLUCONATE 0.12 % MT SOLN
OROMUCOSAL | Status: AC
  Administered 2021-07-02: 15 mL via OROMUCOSAL
  Filled 2021-07-02: qty 15

## 2021-07-02 MED ORDER — FENTANYL CITRATE (PF) 100 MCG/2ML IJ SOLN: 25.0000 ug | INTRAMUSCULAR | Status: DC | PRN

## 2021-07-02 MED ORDER — ACETAMINOPHEN 10 MG/ML IV SOLN
INTRAVENOUS | Status: AC
  Filled 2021-07-02: qty 100

## 2021-07-02 MED ORDER — ONDANSETRON HCL 4 MG/2ML IJ SOLN
INTRAMUSCULAR | Status: DC | PRN
  Administered 2021-07-02: 4 mg via INTRAVENOUS

## 2021-07-02 MED ORDER — SUGAMMADEX SODIUM 500 MG/5ML IV SOLN
INTRAVENOUS | Status: AC
  Filled 2021-07-02: qty 5

## 2021-07-02 MED ORDER — LIDOCAINE HCL (CARDIAC) PF 100 MG/5ML IV SOSY
PREFILLED_SYRINGE | INTRAVENOUS | Status: DC | PRN
  Administered 2021-07-02: 50 mg via INTRAVENOUS

## 2021-07-02 SURGICAL SUPPLY — 36 items
BAG DRAIN CYSTO-URO LG1000N (MISCELLANEOUS) ×4 IMPLANT
BAG URINE DRAIN 2000ML AR STRL (UROLOGICAL SUPPLIES) ×4 IMPLANT
BRUSH SCRUB EZ  4% CHG (MISCELLANEOUS) ×1
BRUSH SCRUB EZ 1% IODOPHOR (MISCELLANEOUS) ×4 IMPLANT
BRUSH SCRUB EZ 4% CHG (MISCELLANEOUS) ×3 IMPLANT
CATH FOL 2WAY LX 18X30 (CATHETERS) ×4 IMPLANT
CATH URETL OPEN 5X70 (CATHETERS) ×4 IMPLANT
CONRAY 43 FOR UROLOGY 50M (MISCELLANEOUS) ×4 IMPLANT
DRAPE UTILITY 15X26 TOWEL STRL (DRAPES) ×4 IMPLANT
DRSG TELFA 4X3 1S NADH ST (GAUZE/BANDAGES/DRESSINGS) ×4 IMPLANT
ELECT LOOP 22F BIPOLAR SML (ELECTROSURGICAL)
ELECT REM PT RETURN 9FT ADLT (ELECTROSURGICAL)
ELECTRODE LOOP 22F BIPOLAR SML (ELECTROSURGICAL) IMPLANT
ELECTRODE REM PT RTRN 9FT ADLT (ELECTROSURGICAL) IMPLANT
GAUZE 4X4 16PLY ~~LOC~~+RFID DBL (SPONGE) ×8 IMPLANT
GLOVE SURG UNDER POLY LF SZ7.5 (GLOVE) ×4 IMPLANT
GOWN STRL REUS W/ TWL LRG LVL3 (GOWN DISPOSABLE) ×3 IMPLANT
GOWN STRL REUS W/ TWL XL LVL3 (GOWN DISPOSABLE) ×3 IMPLANT
GOWN STRL REUS W/TWL LRG LVL3 (GOWN DISPOSABLE) ×1
GOWN STRL REUS W/TWL XL LVL3 (GOWN DISPOSABLE) ×1
GUIDEWIRE STR DUAL SENSOR (WIRE) ×2 IMPLANT
IV NS IRRIG 3000ML ARTHROMATIC (IV SOLUTION) ×8 IMPLANT
KIT TURNOVER CYSTO (KITS) ×4 IMPLANT
LOOP CUT BIPOLAR 24F LRG (ELECTROSURGICAL) ×2 IMPLANT
MANIFOLD NEPTUNE II (INSTRUMENTS) ×2 IMPLANT
NDL SAFETY ECLIPSE 18X1.5 (NEEDLE) ×3 IMPLANT
NEEDLE HYPO 18GX1.5 SHARP (NEEDLE) ×1
PACK CYSTO AR (MISCELLANEOUS) ×4 IMPLANT
PAD ARMBOARD 7.5X6 YLW CONV (MISCELLANEOUS) ×4 IMPLANT
SET CYSTO W/LG BORE CLAMP LF (SET/KITS/TRAYS/PACK) ×4 IMPLANT
SET IRRIG Y TYPE TUR BLADDER L (SET/KITS/TRAYS/PACK) ×4 IMPLANT
SURGILUBE 2OZ TUBE FLIPTOP (MISCELLANEOUS) ×4 IMPLANT
SYR TOOMEY IRRIG 70ML (MISCELLANEOUS) ×4
SYRINGE TOOMEY IRRIG 70ML (MISCELLANEOUS) ×3 IMPLANT
WATER STERILE IRR 1000ML POUR (IV SOLUTION) ×4 IMPLANT
WATER STERILE IRR 500ML POUR (IV SOLUTION) ×4 IMPLANT

## 2021-07-02 NOTE — Anesthesia Procedure Notes (Signed)
Procedure Name: Intubation ?Date/Time: 07/02/2021 10:58 AM ?Performed by: Debe Coder, CRNA ?Pre-anesthesia Checklist: Patient identified, Emergency Drugs available, Suction available and Patient being monitored ?Patient Re-evaluated:Patient Re-evaluated prior to induction ?Oxygen Delivery Method: Circle system utilized ?Preoxygenation: Pre-oxygenation with 100% oxygen ?Induction Type: IV induction ?Ventilation: Mask ventilation without difficulty ?Laryngoscope Size: McGraph and 3 ?Grade View: Grade I ?Tube type: Oral ?Tube size: 7.5 mm ?Number of attempts: 1 ?Airway Equipment and Method: Stylet and Oral airway ?Placement Confirmation: ETT inserted through vocal cords under direct vision, positive ETCO2 and breath sounds checked- equal and bilateral ?Secured at: 22 cm ?Tube secured with: Tape ?Dental Injury: Teeth and Oropharynx as per pre-operative assessment  ? ? ? ? ?

## 2021-07-02 NOTE — Transfer of Care (Signed)
Immediate Anesthesia Transfer of Care Note ? ?Patient: Matthew Mcgrath ? ?Procedure(s) Performed: TRANSURETHRAL RESECTION OF BLADDER TUMOR (TURBT) (Bladder) ?CYSTOSCOPY WITH RETROGRADE PYELOGRAM (Bilateral) ? ?Patient Location: PACU ? ?Anesthesia Type:General ? ?Level of Consciousness: awake and drowsy ? ?Airway & Oxygen Therapy: Patient Spontanous Breathing ? ?Post-op Assessment: Report given to RN and Post -op Vital signs reviewed and stable ? ?Post vital signs: Reviewed and stable ? ?Last Vitals:  ?Vitals Value Taken Time  ?BP 132/89 07/02/21 1145  ?Temp 36.2 ?C 07/02/21 1145  ?Pulse 70 07/02/21 1151  ?Resp 17 07/02/21 1151  ?SpO2 96 % 07/02/21 1151  ? ? ?Last Pain:  ?Vitals:  ? 07/02/21 1145  ?TempSrc:   ?PainSc: Asleep  ?   ? ?  ? ?Complications: No notable events documented. ?

## 2021-07-02 NOTE — Op Note (Signed)
Date of procedure: 07/02/21 ? ?Preoperative diagnosis:  ?Bladder tumor ? ?Postoperative diagnosis:  ?Same ? ?Procedure: ?TURBT, 2.5cm ?Bilateral retrograde pyelograms with intraoperative interpretation ? ?Surgeon: Nickolas Madrid, MD ? ?Anesthesia: General ? ?Complications: None ? ?Intraoperative findings:  ?Small prostate, mild trabeculations, ureteral orifices close to bladder neck ? ?EBL: Minimal ? ?Specimens: Bladder tumor ? ?Drains: 62 French Foley ? ?Indication: Matthew Mcgrath is a 76 y.o. patient with history of metastatic prostate cancer who developed gross hematuria and was found to have a 2.5 cm bladder tumor on clinic cystoscopy.  After reviewing the management options for treatment, they elected to proceed with the above surgical procedure(s). We have discussed the potential benefits and risks of the procedure, side effects of the proposed treatment, the likelihood of the patient achieving the goals of the procedure, and any potential problems that might occur during the procedure or recuperation. Informed consent has been obtained. ? ?Description of procedure: ? ?The patient was taken to the operating room and general anesthesia was induced. SCDs were placed for DVT prophylaxis. The patient was placed in the dorsal lithotomy position, prepped and draped in the usual sterile fashion, and preoperative antibiotics(Ancef) were administered. A preoperative time-out was performed.  ? ?A 21 French rigid cystoscope was used to intubate the urethra and a normal-appearing urethra was followed proximally to the bladder.  The prostate was small.  Thorough inspection of the bladder revealed the ureteral orifices were orthotopic and very close to the bladder neck, mild bladder trabeculations, 2.5 cm bullous and papillary tumor at the left dome. ? ?A sensor wire was used to intubate the left ureteral orifice and a 5 French access catheter advanced over the wire into the opening of the ureter.  A retrograde pyelogram  showed no filling defects or hydronephrosis.  An identical procedure was performed on the right side, and again there were no filling defects or hydronephrosis. ? ?The visual obturator was used to insert the 26 [place] resectoscope.  A large resecting loop was used to methodically resect the 2.5 cm bladder tumor down to muscle.  Tumor was removed from the bladder and sent to pathology.  A deep biopsy of the tumor base was taken with the cold cup biopsy forceps.  The base of the tumor was methodically fulgurated and there was excellent hemostasis with the bladder decompressed. ? ?An 51 French Foley passed easily into the bladder with return of clear yellow urine, and 10 mL placed in the balloon.  The bladder was irrigated with 300 mL sterile water, and remained clear.  I had previously ordered gemcitabine, but this was unavailable from the cancer center today. ? ?Disposition: Stable to PACU ? ?Plan: ?Remove Foley in 3 to 7 days, voiding trial with PVR ?Follow-up to discuss pathology results ? ?Nickolas Madrid, MD ? ?

## 2021-07-02 NOTE — Anesthesia Preprocedure Evaluation (Signed)
Anesthesia Evaluation  ?Patient identified by MRN, date of birth, ID band ?Patient awake and Patient confused ? ? ? ?Reviewed: ?Allergy & Precautions, NPO status , Patient's Chart, lab work & pertinent test results ? ?Airway ?Mallampati: II ? ?TM Distance: >3 FB ?Neck ROM: Full ? ? ? Dental ? ?(+) Missing, Partial Upper, Partial Lower ?  ?Pulmonary ?neg pulmonary ROS, shortness of breath and with exertion, former smoker,  ?  ?Pulmonary exam normal ?breath sounds clear to auscultation ? ? ? ? ? ? Cardiovascular ?Exercise Tolerance: Poor ?hypertension, Pt. on medications ?+CHF  ?negative cardio ROS ?Normal cardiovascular exam+ pacemaker  ?Rhythm:Regular  ? ?  ?Neuro/Psych ?Slow mentation...negative neurological ROS ? negative psych ROS  ? GI/Hepatic ?negative GI ROS, Neg liver ROS,   ?Endo/Other  ?negative endocrine ROSdiabetes, Type 2, Oral Hypoglycemic Agents ? Renal/GU ?  ?negative genitourinary ?  ?Musculoskeletal ? ? Abdominal ?Normal abdominal exam  (+)   ?Peds ?negative pediatric ROS ?(+)  Hematology ?negative hematology ROS ?(+) Blood dyscrasia, anemia ,   ?Anesthesia Other Findings ?Past Medical History: ?No date: Cardiomyopathy [facility]) ?No date: Congestive heart failure ([place]) ?No date: Diabetes mellitus type 2, controlled ([place]) ?No date: Elevated lipids ?No date: Family history of breast cancer ?No date: Family history of colon cancer ?No date: HTN (hypertension) ? ?Past Surgical History: ?04/14/2015: CARDIAC CATHETERIZATION; N/A ?    Comment:  Procedure: Left Heart Cath and Coronary Angiography;   ?             Surgeon: [name], MD;  Location: [place]  ?             INVASIVE CV LAB;  Service: Cardiovascular;  Laterality:  ?             N/A; ?No date: CATARACT EXTRACTION W/ INTRAOCULAR LENS  IMPLANT, BILATERAL;  ?Left ?04/08/2020: COLONOSCOPY WITH PROPOFOL; N/A ?    Comment:  Procedure: COLONOSCOPY WITH PROPOFOL;  Surgeon: [place],  ?             Benay Pike, MD;  Location:  [facility] ENDOSCOPY;  Service:  ?             Gastroenterology;  Laterality: N/A; ?No date: HERNIA REPAIR ?07/03/2015: IMPLANTABLE CARDIOVERTER DEFIBRILLATOR (ICD) GENERATOR  ?CHANGE; Left ?    Comment:  Procedure: ICD IMPLANT single chamber;  Surgeon: Priscille Heidelberg ?             Marcello Moores, MD;  Location: [facility] ORS;  Service:  ?             Cardiovascular;  Laterality: Left; ? ?BMI   ? Body Mass Index: 31.80 kg/m?  ?  ? ? Reproductive/Obstetrics ?negative OB ROS ? ?  ? ? ? ? ? ? ? ? ? ? ? ? ? ?  ?  ? ? ? ? ? ? ? ? ?Anesthesia Physical ?Anesthesia Plan ? ?ASA: 4 ? ?Anesthesia Plan: General  ? ?Post-op Pain Management:   ? ?Induction: Intravenous ? ?PONV Risk Score and Plan: Dexamethasone, Ondansetron, Midazolam and Treatment may vary due to age or medical condition ? ?Airway Management Planned: LMA ? ?Additional Equipment:  ? ?Intra-op Plan:  ? ?Post-operative Plan: Extubation in OR ? ?Informed Consent: I have reviewed the patients History and Physical, chart, labs and discussed the procedure including the risks, benefits and alternatives for the proposed anesthesia with the patient or authorized representative who has indicated his/her understanding and acceptance.  ? ? ? ?Dental Advisory Given ? ?Plan Discussed with:  CRNA and Surgeon ? ?Anesthesia Plan Comments:   ? ? ? ? ? ? ?Anesthesia Quick Evaluation ? ?

## 2021-07-02 NOTE — Anesthesia Postprocedure Evaluation (Signed)
Anesthesia Post Note ? ?Patient: Matthew Mcgrath ? ?Procedure(s) Performed: TRANSURETHRAL RESECTION OF BLADDER TUMOR (TURBT) (Bladder) ?CYSTOSCOPY WITH RETROGRADE PYELOGRAM (Bilateral) ? ?Patient location during evaluation: PACU ?Anesthesia Type: General ?Level of consciousness: awake and awake and alert ?Pain management: satisfactory to patient ?Vital Signs Assessment: post-procedure vital signs reviewed and stable ?Respiratory status: spontaneous breathing and respiratory function stable ?Cardiovascular status: stable ?Anesthetic complications: no ? ? ?No notable events documented. ? ? ?Last Vitals:  ?Vitals:  ? 07/02/21 1230 07/02/21 1244  ?BP: (!) 157/92 (!) 158/97  ?Pulse: 73 77  ?Resp: 14 16  ?Temp: (!) 36.1 ?C (!) 36.1 ?C  ?SpO2: 94% 98%  ?  ?Last Pain:  ?Vitals:  ? 07/02/21 1244  ?TempSrc: Temporal  ?PainSc: 0-No pain  ? ? ?  ?  ?  ?  ?  ?  ? ?VAN STAVEREN,Greenlee Ancheta ? ? ? ? ?

## 2021-07-02 NOTE — Discharge Instructions (Signed)

## 2021-07-02 NOTE — H&P (Signed)
? ?07/02/21 ?10:32 AM  ? ?Eula Flax ?13-Sep-1945 ?[id number] ? ?CC: bladder tumor ? ?HPI: ?Comorbid 76 year old male with metastatic prostate cancer managed by oncology with ADT, darolutamide, and also has history of radiation of the prostate and pelvic nodes.  He has had some ongoing hematuria and on cystoscopy found to have a 2 to 3 cm bladder tumor.  I had a long discussion with the patient and his sister who helps him make medical decisions, and recommended TURBT for treatment and diagnosis.  This appears to be separate from his previously diagnosed prostate cancer. ? ?He was also recently treated and admitted for UTI, denies any urinary symptoms or fevers today. ? ?PMH: ?Past Medical History:  ?Diagnosis Date  ? Cardiomyopathy ([place])   ? Congestive heart failure ([place])   ? Diabetes mellitus type 2, controlled ([place])   ? Elevated lipids   ? Family history of breast cancer   ? Family history of colon cancer   ? HTN (hypertension)   ? ? ?Surgical History: ?Past Surgical History:  ?Procedure Laterality Date  ? CARDIAC CATHETERIZATION N/A 04/14/2015  ? Procedure: Left Heart Cath and Coronary Angiography;  Surgeon: [name], MD;  Location: [place] CV LAB;  Service: Cardiovascular;  Laterality: N/A;  ? CATARACT EXTRACTION W/ INTRAOCULAR LENS  IMPLANT, BILATERAL Left   ? COLONOSCOPY WITH PROPOFOL N/A 04/08/2020  ? Procedure: COLONOSCOPY WITH PROPOFOL;  Surgeon: [place], Benay Pike, MD;  Location: [facility] ENDOSCOPY;  Service: Gastroenterology;  Laterality: N/A;  ? HERNIA REPAIR    ? IMPLANTABLE CARDIOVERTER DEFIBRILLATOR (ICD) GENERATOR CHANGE Left 07/03/2015  ? Procedure: ICD IMPLANT single chamber;  Surgeon: Marzetta Board, MD;  Location: [facility] ORS;  Service: Cardiovascular;  Laterality: Left;  ? ? ? ?Family History: ?Family History  ?Problem Relation Age of Onset  ? Heart failure Mother   ? Heart disease Mother   ? Alcoholism Father   ? Hypertension Sister   ? Diabetes Sister   ? Hypertension Brother   ?  Diabetes Brother   ? Colon cancer Brother   ?     vs prostate  ? Multiple myeloma Brother   ? Breast cancer Cousin   ? ? ?Social History:  reports that he quit smoking about 16 months ago. His smoking use included cigarettes. He has a 7.50 pack-year smoking history. He has never used smokeless tobacco. He reports that he does not currently use drugs after having used the following drugs: Marijuana. Frequency: 2.00 times per week. He reports that he does not drink alcohol. ? ?Physical Exam: ?BP 133/89   Pulse 88   Temp 98.3 ?F (36.8 ?C) (Oral)   Resp 18   Ht _0  (1.676 m)   Wt 89.4 kg   SpO2 97%   BMI 31.80 kg/m?   ? ?Constitutional:  Alert and oriented, No acute distress. ?Cardiovascular: Regular rate and rhythm ?Respiratory: Clear to auscultation bilaterally ?GI: Abdomen is soft, nontender, nondistended, no abdominal masses ? ?Laboratory Data: ?Culture 06/18/2021 with E. coli and Proteus, treated with culture appropriate antibiotics ? ?Assessment & Plan:   ?Comorbid 76 year old male with metastatic prostate cancer managed by oncology who developed gross hematuria and was found to have a 2 to 3 cm bladder tumor on clinic cystoscopy. ? ?We discussed transurethral resection of bladder tumor (TURBT) and risks and benefits at length. This is typically a 1 to 2-hour procedure done under general anesthesia in the operating room.  A scope is inserted through the urethra and used to resect  abnormal tissue within the bladder, which is then sent to the pathologist to determine grade and stage of the tumor.  Risks include bleeding, infection, need for temporary Foley placement, and bladder perforation.  Treatment strategies are based on the type of tumor and depth of invasion.  We briefly reviewed the different treatment pathways for non-muscle invasive and muscle invasive bladder cancer. ? ?TURBT, retrograde pyelograms, possible gemcitabine ? ?Nickolas Madrid, MD ?07/02/2021 ? ?[facility] ?[address], [address] ?[place], [place] [address] ?[phone] ? ?

## 2021-07-04 LAB — URINE CULTURE: Culture: NO GROWTH

## 2021-07-06 LAB — SURGICAL PATHOLOGY

## 2021-07-08 ENCOUNTER — Ambulatory Visit (INDEPENDENT_AMBULATORY_CARE_PROVIDER_SITE_OTHER): Payer: Medicare (Managed Care) | Admitting: Urology

## 2021-07-08 ENCOUNTER — Ambulatory Visit (INDEPENDENT_AMBULATORY_CARE_PROVIDER_SITE_OTHER): Payer: Medicare (Managed Care) | Admitting: Physician Assistant

## 2021-07-08 VITALS — Ht 66.0 in | Wt 197.0 lb

## 2021-07-08 DIAGNOSIS — C61 Malignant neoplasm of prostate: Secondary | ICD-10-CM

## 2021-07-08 DIAGNOSIS — C671 Malignant neoplasm of dome of bladder: Secondary | ICD-10-CM | POA: Diagnosis not present

## 2021-07-08 DIAGNOSIS — D494 Neoplasm of unspecified behavior of bladder: Secondary | ICD-10-CM

## 2021-07-08 LAB — BLADDER SCAN AMB NON-IMAGING

## 2021-07-08 MED ORDER — SULFAMETHOXAZOLE-TRIMETHOPRIM 800-160 MG PO TABS
1.0000 | ORAL_TABLET | Freq: Once | ORAL | Status: AC
  Administered 2021-07-08: 1 via ORAL

## 2021-07-08 NOTE — Progress Notes (Signed)
Catheter Removal ? ?Patient is present today for a catheter removal. 10m of water was drained from the balloon. A 18FR foley cath was removed from the bladder no complications were noted. Patient tolerated well. ? ?Performed by: OGordy Clement CHarvel? ?Follow up/ Additional notes: RTC this afternoon for PVR  ? ?

## 2021-07-08 NOTE — Progress Notes (Signed)
? ?  07/08/2021 ?3:12 PM  ? ?Matthew Mcgrath ?December 16, 1945 ?[id number] ? ?Reason for visit: Follow up new diagnosis of bladder cancer, metastatic prostate cancer ? ?HPI: ?Comorbid 76 year old male with metastatic prostate cancer managed by oncology with ADT, darolutamide, and also has history of radiation of the prostate and pelvic nodes.  He developed gross hematuria and on cystoscopy was found have a 2.5 cm bladder tumor.  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  Foley was removed this morning, and he has been able to void since that time.  Bactrim was given for prophylaxis. ? ?We discussed his new diagnosis of bladder cancer, and options including surveillance, repeat TURBT, or additional intravesical therapy like BCG.  With his comorbidities, history of recent UTI and sepsis, and metastatic prostate cancer, I think it is reasonable to defer second look TURBT and aggressive BCG treatment secondary to the very small size of his lesion, complete removal during initial TURBT, and his comorbidities.  We discussed the risks of recurrence at length, and the need for close surveillance.  He is a non-smoker.  He is in agreement for surveillance cystoscopy, with his comorbidities if he is doing well over the first year we can consider spacing these out less frequently. ? ?Continue management of metastatic prostate cancer with oncology ?Follow-up for initial surveillance cystoscopy in 3 months ? ? ? ?Billey Co, MD ? ?[facility] ?[address], [address] ?[place], [place] [address] ?[phone] ? ? ?

## 2021-07-08 NOTE — Patient Instructions (Signed)

## 2021-07-27 ENCOUNTER — Inpatient Hospital Stay: Payer: Medicare (Managed Care) | Attending: Oncology

## 2021-07-27 DIAGNOSIS — Z87891 Personal history of nicotine dependence: Secondary | ICD-10-CM | POA: Insufficient documentation

## 2021-07-27 DIAGNOSIS — I509 Heart failure, unspecified: Secondary | ICD-10-CM | POA: Insufficient documentation

## 2021-07-27 DIAGNOSIS — Z807 Family history of other malignant neoplasms of lymphoid, hematopoietic and related tissues: Secondary | ICD-10-CM | POA: Insufficient documentation

## 2021-07-27 DIAGNOSIS — I110 Hypertensive heart disease with heart failure: Secondary | ICD-10-CM | POA: Insufficient documentation

## 2021-07-27 DIAGNOSIS — Z1501 Genetic susceptibility to malignant neoplasm of breast: Secondary | ICD-10-CM | POA: Insufficient documentation

## 2021-07-27 DIAGNOSIS — Z803 Family history of malignant neoplasm of breast: Secondary | ICD-10-CM | POA: Insufficient documentation

## 2021-07-27 DIAGNOSIS — C61 Malignant neoplasm of prostate: Secondary | ICD-10-CM | POA: Insufficient documentation

## 2021-07-27 DIAGNOSIS — C7951 Secondary malignant neoplasm of bone: Secondary | ICD-10-CM | POA: Insufficient documentation

## 2021-07-27 DIAGNOSIS — D649 Anemia, unspecified: Secondary | ICD-10-CM | POA: Insufficient documentation

## 2021-07-27 DIAGNOSIS — Z800 Family history of malignant neoplasm of digestive organs: Secondary | ICD-10-CM | POA: Diagnosis not present

## 2021-07-27 LAB — CBC WITH DIFFERENTIAL/PLATELET
Abs Immature Granulocytes: 0.01 10*3/uL (ref 0.00–0.07)
Basophils Absolute: 0 10*3/uL (ref 0.0–0.1)
Basophils Relative: 1 %
Eosinophils Absolute: 0.1 10*3/uL (ref 0.0–0.5)
Eosinophils Relative: 1 %
HCT: 37 % — ABNORMAL LOW (ref 39.0–52.0)
Hemoglobin: 11.3 g/dL — ABNORMAL LOW (ref 13.0–17.0)
Immature Granulocytes: 0 %
Lymphocytes Relative: 19 %
Lymphs Abs: 0.8 10*3/uL (ref 0.7–4.0)
MCH: 29.4 pg (ref 26.0–34.0)
MCHC: 30.5 g/dL (ref 30.0–36.0)
MCV: 96.1 fL (ref 80.0–100.0)
Monocytes Absolute: 0.3 10*3/uL (ref 0.1–1.0)
Monocytes Relative: 7 %
Neutro Abs: 3 10*3/uL (ref 1.7–7.7)
Neutrophils Relative %: 72 %
Platelets: 240 10*3/uL (ref 150–400)
RBC: 3.85 MIL/uL — ABNORMAL LOW (ref 4.22–5.81)
RDW: 15.4 % (ref 11.5–15.5)
WBC: 4.1 10*3/uL (ref 4.0–10.5)
nRBC: 0 % (ref 0.0–0.2)

## 2021-07-27 LAB — COMPREHENSIVE METABOLIC PANEL
ALT: 12 U/L (ref 0–44)
AST: 21 U/L (ref 15–41)
Albumin: 3.8 g/dL (ref 3.5–5.0)
Alkaline Phosphatase: 59 U/L (ref 38–126)
Anion gap: 8 (ref 5–15)
BUN: 12 mg/dL (ref 8–23)
CO2: 24 mmol/L (ref 22–32)
Calcium: 9 mg/dL (ref 8.9–10.3)
Chloride: 102 mmol/L (ref 98–111)
Creatinine, Ser: 0.73 mg/dL (ref 0.61–1.24)
GFR, Estimated: >60 mL/min (ref >60)
Glucose, Bld: 186 mg/dL — ABNORMAL HIGH (ref 70–99)
Potassium: 4 mmol/L (ref 3.5–5.1)
Sodium: 134 mmol/L — ABNORMAL LOW (ref 135–145)
Total Bilirubin: 0.7 mg/dL (ref 0.3–1.2)
Total Protein: 7.8 g/dL (ref 6.5–8.1)

## 2021-07-27 LAB — PSA: Prostatic Specific Antigen: 0.09 ng/mL (ref 0.00–4.00)

## 2021-07-29 ENCOUNTER — Encounter: Payer: Self-pay | Admitting: Oncology

## 2021-07-29 ENCOUNTER — Inpatient Hospital Stay (HOSPITAL_BASED_OUTPATIENT_CLINIC_OR_DEPARTMENT_OTHER): Payer: Medicare (Managed Care) | Admitting: Oncology

## 2021-07-29 VITALS — BP 141/85 | HR 80 | Temp 96.6°F | Ht 66.0 in | Wt 191.0 lb

## 2021-07-29 DIAGNOSIS — Z1509 Genetic susceptibility to other malignant neoplasm: Secondary | ICD-10-CM | POA: Diagnosis not present

## 2021-07-29 DIAGNOSIS — Z1501 Genetic susceptibility to malignant neoplasm of breast: Secondary | ICD-10-CM | POA: Diagnosis not present

## 2021-07-29 DIAGNOSIS — C61 Malignant neoplasm of prostate: Secondary | ICD-10-CM

## 2021-07-29 DIAGNOSIS — Z5111 Encounter for antineoplastic chemotherapy: Secondary | ICD-10-CM

## 2021-07-30 ENCOUNTER — Encounter: Payer: Self-pay | Admitting: Oncology

## 2021-07-30 NOTE — Progress Notes (Signed)
Hematology/Oncology Progress note [phone] [phone]      Patient Care Team: Inc, [facility] as PCP - General [name], MD as Consulting Physician (Cardiology) Alisa Graff, FNP as Nurse Practitioner (Family Medicine) [name], MD as Consulting Physician (Hematology and Oncology)  REFERRING PROVIDER: Inc, Rafael Capo COMPLAINTS/REASON FOR VISIT:  metastatic prostate cancer  HISTORY OF PRESENTING ILLNESS:   Matthew Mcgrath is a  76 y.o.  male with PMH listed below was seen in consultation at the request of  Inc, [place]  for evaluation of metastatic prostate cancer. Patient was accompanied by her sister who he currently lives with.  History was mainly obtained from sister as well as reviewing medical records.  Patient is a poor historian. Per sister, she has noticed patient having increased urgency and frequency of urination.  Patient was evaluated by urologist Dr. Diamantina Providence in January 2022.  PSA was elevated at 30.4 in January 2022 04/01/2020, prostate biopsy showed  All cores were positive for high risk prostate cancer, primarily Gleason score 5+4 = 9, with max core involvement of 100% and perineural invasion present. 04/21/2020, CT abdomen pelvis showed a sclerotic lesion in the lumbar spine concerning for probable metastatic disease.  No definitive extra skeletal metastatic disease noted elsewhere in the abdomen or pelvis.  Mild circumferential bladder wall thickening.  Aortic atherosclerosis. 04/27/2020, bone scan showed foci of abnormal uptake noted in the mid thoracic and upper lumbar spine concerning for metastatic disease.  Patient reports some lower back pain intermittently.  He currently lives with his sister.   Past medical history includes CHF, hypertension, hyper lipidemia, diabetes type 2 Per sister, family history is positive for colon cancer and another brother, multiple myeloma in the other  brother.  04/16/2020 Firmagon 240 mg loading dose #06/11/2020 patient went to [facility] for second opinion was seen by [name] who agrees with continuing ADT with switch to Eligard 45 mg.  He is due to proceed with the treatments today.  Communicated with pace program [name], patient will receive treatment at [facility]. [name] also recommends to start darolutamide and authorization process has been initiated at [facility]. #07/06/2020 started darolutamide 600 mg twice daily # definitive treatment for prostate and pelvic node- Discussed with [facility] [name] and I appreciate his expert opinion of darolutamide concurrent use with RT and ADT # 10/19/2020 finished definitive RT. To prostate cancer and pelvic lymph node treatments. #02/13/2021, CT chest abdomen pelvis without contrast showed slight interval increase in sclerosis of previously osseous metastasis involving lumbar spine, most consistent with posttreatment sclerosis.  Focally severe disc degeneration and sclerosis versus sclerotic osseous metastatic of T6 and 7.  Attention on follow-up.  No noncontrast evidence of lymphadenopathy or soft tissue metastatic disease in chest abdomen pelvis.  Prostamegaly.  Enlargement of main pulmonary artery.  Coronary artery disease.  Patient has genetic testing done which showed BRCA2 mutation.  03/11/2021, bone scan showed improved/decreased uptake in T6 and L2.  INTERVAL HISTORY Matthew Mcgrath is a 76 y.o. male who has above history reviewed by me today presents for follow up visit for management of metastatic prostate cancer Patient was accompanied by his sister.  April 2023, Eligard injections via primary care provider's office. 06/18/2021 - 06/21/2021, patient was admitted due to complicated UTI, gross hematuria.. Patient has been on darolutamide 600 mg twice daily denies any new complaints.  Denies any  new bone pain.  Chronic back pain unchanged.  Review of Systems  Constitutional:  Positive for fatigue. Negative for appetite  change, chills, fever and unexpected weight change.  HENT:   Negative for hearing loss and voice change.   Eyes:  Negative for eye problems and icterus.  Respiratory:  Negative for chest tightness, cough and shortness of breath.   Cardiovascular:  Negative for chest pain and leg swelling.  Gastrointestinal:  Negative for abdominal distention and abdominal pain.  Endocrine: Negative for hot flashes.  Genitourinary:  Negative for difficulty urinating, dysuria and frequency.   Musculoskeletal:  Positive for back pain. Negative for arthralgias.  Skin:  Negative for itching and rash.  Neurological:  Negative for light-headedness and numbness.  Hematological:  Negative for adenopathy. Does not bruise/bleed easily.  Psychiatric/Behavioral:  Negative for confusion.    MEDICAL HISTORY:  Past Medical History:  Diagnosis Date   Cardiomyopathy ([place])    Congestive heart failure (HCC)    Diabetes mellitus type 2, controlled ([place])    Elevated lipids    Family history of breast cancer    Family history of colon cancer    HTN (hypertension)     SURGICAL HISTORY: Past Surgical History:  Procedure Laterality Date   CARDIAC CATHETERIZATION N/A 04/14/2015   Procedure: Left Heart Cath and Coronary Angiography;  Surgeon: [name], MD;  Location: [place] [facility];  Service: Cardiovascular;  Laterality: N/A;   CATARACT EXTRACTION W/ INTRAOCULAR LENS  IMPLANT, BILATERAL Left    COLONOSCOPY WITH PROPOFOL N/A 04/08/2020   Procedure: COLONOSCOPY WITH PROPOFOL;  Surgeon: Toledo, Benay Pike, MD;  Location: [facility];  Service: Gastroenterology;  Laterality: N/A;   CYSTOSCOPY W/ RETROGRADES Bilateral 07/02/2021   Procedure: CYSTOSCOPY WITH RETROGRADE PYELOGRAM;  Surgeon: Billey Co, MD;  Location: [facility] OR[facility];  Service: Urology;  Laterality: Bilateral;   HERNIA REPAIR     IMPLANTABLE CARDIOVERTER DEFIBRILLATOR (ICD) GENERATOR CHANGE Left 07/03/2015   Procedure: ICD IMPLANT single chamber;   Surgeon: Marzetta Board, MD;  Location: [facility];  Service: Cardiovascular;  Laterality: Left;   TRANSURETHRAL RESECTION OF BLADDER TUMOR N/A 07/02/2021   Procedure: TRANSURETHRAL RESECTION OF BLADDER TUMOR (TURBT);  Surgeon: Billey Co, MD;  Location: [facility];  Service: Urology;  Laterality: N/A;    SOCIAL HISTORY: Social History   Socioeconomic History   Marital status: Single    Spouse name: Not on file   Number of children: Not on file   Years of education: Not on file   Highest education level: Not on file  Occupational History   Not on file  Tobacco Use   Smoking status: Former    Packs/day: 0.25    Years: 30.00    Pack years: 7.50    Types: Cigarettes    Quit date: 02/11/2020    Years since quitting: 1.4   Smokeless tobacco: Never  Vaping Use   Vaping Use: Never used  Substance and Sexual Activity   Alcohol use: No   Drug use: Not Currently    Frequency: 2.0 times per week    Types: Marijuana    Comment: past use   Sexual activity: Not on file  Other Topics Concern   Not on file  Social History Narrative   Not on file   Social Determinants of Health   Financial Resource Strain: Not on file  Food Insecurity: Not on file  Transportation Needs: Not on file  Physical Activity: Not on file  Stress: Not on file  Social Connections: Not on file  Intimate Partner Violence: Not on file  FAMILY HISTORY: Family History  Problem Relation Age of Onset   Heart failure Mother    Heart disease Mother    Alcoholism Father    Hypertension Sister    Diabetes Sister    Hypertension Brother    Diabetes Brother    Colon cancer Brother        vs prostate   Multiple myeloma Brother    Breast cancer Cousin     ALLERGIES:  has No Known Allergies.  MEDICATIONS:  Current Outpatient Medications  Medication Sig Dispense Refill   acetaminophen (TYLENOL) 650 MG CR tablet Take 1,300 mg by mouth 2 (two) times daily.     carvedilol (COREG) 6.25 MG tablet Take 3.125  mg by mouth 2 (two) times daily.     cholecalciferol (VITAMIN D3) 25 MCG (1000 UNIT) tablet Take 1,000 Units by mouth daily.     darolutamide (NUBEQA) 300 MG tablet Take 600 mg by mouth 2 (two) times daily with a meal.     glucose blood (TRUE METRIX BLOOD GLUCOSE TEST) test strip 1 each by Other route as needed for other. Use as instructed     GLUMETZA 1000 MG 24 hr tablet Take 1,000 mg by mouth daily.     JARDIANCE 10 MG TABS tablet Take 10 mg by mouth daily.     LEUPROLIDE ACETATE, 6 MONTH, 45 MG injection Inject into the skin.     LIDODERM 5 % Apply 1 patch to skin every morning as needed for L lower back pain. Remove after 12 hours. Call for refills     lisinopril (ZESTRIL) 2.5 MG tablet Take 2.5 mg by mouth daily.     magnesium oxide (MAG-OX) 400 (240 Mg) MG tablet Take 1 tablet by mouth 2 (two) times daily.     metFORMIN (GLUCOPHAGE-XR) 500 MG 24 hr tablet Take 500 mg by mouth every evening.     Multiple Vitamin (MULTIVITAMIN ADULT PO) SMARTSIG:1 Tablet(s) By Mouth Daily     Omega-3 Fatty Acids (OMEGA III EPA+DHA) 1000 MG CAPS Take 1 capsule by mouth 2 (two) times daily.     spironolactone (ALDACTONE) 25 MG tablet Take 0.5 tablets (12.5 mg total) by mouth daily.     tamsulosin (FLOMAX) 0.4 MG CAPS capsule Take 1 capsule (0.4 mg total) by mouth daily. 30 capsule 2   TRUEplus Lancets 28G MISC by Does not apply route.     No current facility-administered medications for this visit.     PHYSICAL EXAMINATION: ECOG PERFORMANCE STATUS: 1 - Symptomatic but completely ambulatory Vitals:   07/29/21 1358  BP: (!) 141/85  Pulse: 80  Temp: (!) 96.6 F (35.9 C)   Filed Weights   07/29/21 1358  Weight: 191 lb (86.6 kg)    Physical Exam Constitutional:      General: He is not in acute distress. HENT:     Head: Normocephalic and atraumatic.  Eyes:     General: No scleral icterus. Cardiovascular:     Rate and Rhythm: Normal rate and regular rhythm.     Heart sounds: Normal heart  sounds.  Pulmonary:     Effort: Pulmonary effort is normal. No respiratory distress.     Breath sounds: No wheezing.  Abdominal:     General: Bowel sounds are normal. There is no distension.     Palpations: Abdomen is soft.  Musculoskeletal:        General: No deformity. Normal range of motion.     Cervical back: Normal range of motion and neck  supple.  Skin:    General: Skin is warm and dry.     Findings: No erythema or rash.  Neurological:     Mental Status: He is alert and oriented to person, place, and time. Mental status is at baseline.     Cranial Nerves: No cranial nerve deficit.     Coordination: Coordination normal.  Psychiatric:        Mood and Affect: Mood normal.    LABORATORY DATA:  I have reviewed the data as listed Lab Results  Component Value Date   WBC 4.1 07/27/2021   HGB 11.3 (L) 07/27/2021   HCT 37.0 (L) 07/27/2021   MCV 96.1 07/27/2021   PLT 240 07/27/2021   Recent Labs    05/11/21 1324 06/18/21 2000 06/19/21 0623 06/20/21 0444 06/21/21 0531 07/27/21 1057  NA 138 138   < > 136 136 134*  K 4.0 4.0   < > 3.6 3.4* 4.0  CL 106 107   < > 105 102 102  CO2 24 22   < > 24 25 24   GLUCOSE 193* 191*   < > 157* 135* 186*  BUN 13 25*   < > 16 18 12   CREATININE 0.72 0.93   < > 0.71 0.67 0.73  CALCIUM 9.4 9.4   < > 8.6* 8.8* 9.0  GFRNONAA >60 >60   < > >60 >60 >60  PROT 7.1 7.4  --   --   --  7.8  ALBUMIN 3.9 3.8  --   --   --  3.8  AST 18 23  --   --   --  21  ALT 13 15  --   --   --  12  ALKPHOS 53 61  --   --   --  59  BILITOT 0.5 0.6  --   --   --  0.7   < > = values in this interval not displayed.    Iron/TIBC/Ferritin/ %Sat    Component Value Date/Time   IRON 82 11/23/2020 0953   TIBC 335 11/23/2020 0953   FERRITIN 221 11/23/2020 0953   IRONPCTSAT 25 11/23/2020 0953     RADIOGRAPHIC STUDIES: I have personally reviewed the radiological images as listed and agreed with the findings in the report. DG Chest 2 View  Result Date:  06/18/2021 CLINICAL DATA:  Fever, tachycardia EXAM: CHEST - 2 VIEW COMPARISON:  07/04/2015 FINDINGS: Frontal and lateral views of the chest demonstrate stable AICD. Cardiac silhouette is unremarkable. Linear opacities at the lung bases, left greater than right, most consistent with scarring or subsegmental atelectasis. No acute airspace disease, effusion, or pneumothorax. No acute bony abnormalities. IMPRESSION: 1. Bilateral lower lobe subsegmental atelectasis or scarring. No acute airspace disease. Electronically Signed   By: Randa Ngo M.D.   On: 06/18/2021 22:28   CT ABDOMEN PELVIS W CONTRAST  Result Date: 06/18/2021 CLINICAL DATA:  Abdominal pain, acute, nonlocalized. Hematuria with clots following cystoscopy. EXAM: CT ABDOMEN AND PELVIS WITH CONTRAST TECHNIQUE: Multidetector CT imaging of the abdomen and pelvis was performed using the standard protocol following bolus administration of intravenous contrast. RADIATION DOSE REDUCTION: This exam was performed according to the departmental dose-optimization program which includes automated exposure control, adjustment of the mA and/or kV according to patient size and/or use of iterative reconstruction technique. CONTRAST:  118m OMNIPAQUE IOHEXOL 300 MG/ML  SOLN COMPARISON:  None. FINDINGS: Lower chest: Mild bibasilar atelectasis or scarring. Extensive coronary artery calcification. Pacemaker leads noted within the right heart.  Small hiatal hernia. Hepatobiliary: No focal liver abnormality is seen. No gallstones, gallbladder wall thickening, or biliary dilatation. Pancreas: Unremarkable Spleen: Unremarkable Adrenals/Urinary Tract: The adrenal glands are unremarkable. The kidneys are normal in size and position. No hydronephrosis. No enhancing intrarenal masses. No intrarenal or ureteral calculi. The bladder is circumferentially thick walled and there is mild perivesicular inflammatory stranding,. There is nondependent gas within the lumen as well as  lobulated hyperdense material containing interstitial gas dependently within the bladder lumen in keeping with blood clot. No extraluminal gas or fluid collection identified. Stomach/Bowel: Stomach is within normal limits. Appendix appears normal. No evidence of bowel wall thickening, distention, or inflammatory changes. Vascular/Lymphatic: Extensive atherosclerotic calcification and moderate atheromatous plaque is seen within the aortoiliac vasculature. No aortic aneurysm. Particularly prominent atherosclerotic calcification at the origin of the mesenteric and renal vasculature may result in hemodynamically significant stenosis, though this is not well assessed on this non arteriographic study. No pathologic adenopathy within the abdomen and pelvis. Reproductive: Prostate is unremarkable. Other: Small, shallow, broad-based fat containing umbilical hernia. Probable right inguinal hernia repair Musculoskeletal: No acute bone abnormality. Degenerative changes are seen within the lumbar spine. IMPRESSION: Interval development of mild circumferential bowel wall thickening and perivesicular inflammatory stranding. Correlation with urinalysis and urine culture may be helpful to exclude a superimposed infectious or inflammatory process. Superimposed lobulated hyperdense material with interstitial gas dependently within the bladder lumen is in keeping with intraluminal clot. No extraluminal gas or fluid identified to suggest perforation. Extensive coronary artery calcification. Peripheral vascular disease. If there is clinical evidence of hemodynamically significant renal artery stenosis or chronic mesenteric ischemia, CT arteriography may be helpful for further evaluation. Aortic Atherosclerosis (ICD10-I70.0). Electronically Signed   By: Fidela Salisbury M.D.   On: 06/18/2021 22:19   DG OR UROLOGY CYSTO IMAGE ([facility] ONLY)  Result Date: 07/02/2021 There is no interpretation for this exam.  This order is for images obtained  during a surgical procedure.  Please See "Surgeries" Tab for more information regarding the procedure.      ASSESSMENT & PLAN:  1. Encounter for antineoplastic chemotherapy   2. Prostate cancer ([place])   3. BRCA2 gene mutation positive    Cancer Staging  Prostate cancer [facility]) Staging form: Prostate, AJCC 8th Edition - Clinical stage from 05/11/2020: Stage IVB (cTX, cN0, cM1, PSA: 30) - Signed by [name], MD on 05/11/2020   #Metastatic prostate cancer to bone, castration sensitive Eligard 45 mg Q6 months with his PCP at [name] given in November 2022. Recommend him to receive Eligard 45 mg every 6 months. Labs reviewed and discussed with patient.  Continue Darolutamide 600 mg twice daily. PSA nadir 0.01.  Currently PSA level 0.09. Continue monitor   #NGS showed BRCA 2 S1404, BRIP1 R173C 3X, FOXO1 1S382f. 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 Consider possible rucaparib/olaparib in subsequent lines of treatment .  #Normocytic anemia, hemoglobin level is stable at 11.3.  Supportive care measures are necessary for patient well-being and will be provided as necessary. We spent sufficient time to discuss many aspect of care, questions were answered to patient's satisfaction.   All questions were answered. The patient knows to call the clinic with any problems questions or concerns. cc  [facility], [place]   Return of visit: 2 months.     ZEarlie Server MD, PhD 07/30/2021

## 2021-09-17 ENCOUNTER — Encounter: Payer: Self-pay | Admitting: Radiation Oncology

## 2021-09-17 ENCOUNTER — Ambulatory Visit
Admission: RE | Admit: 2021-09-17 | Discharge: 2021-09-17 | Disposition: A | Discharge status: Home / Self Care | Payer: Medicare (Managed Care) | Source: Ambulatory Visit | Attending: Radiation Oncology | Admitting: Radiation Oncology

## 2021-09-17 VITALS — BP 135/86 | HR 77 | Temp 98.2°F | Resp 12

## 2021-09-17 DIAGNOSIS — C7951 Secondary malignant neoplasm of bone: Secondary | ICD-10-CM | POA: Insufficient documentation

## 2021-09-17 DIAGNOSIS — C61 Malignant neoplasm of prostate: Secondary | ICD-10-CM | POA: Diagnosis not present

## 2021-09-17 DIAGNOSIS — Z923 Personal history of irradiation: Secondary | ICD-10-CM | POA: Diagnosis not present

## 2021-09-17 NOTE — Progress Notes (Signed)
Radiation Oncology Follow up Note  Name: Matthew Mcgrath   Date:   09/17/2021 MRN:  [id number] DOB: 1945/09/24    This 76 y.o. male presents to the clinic today for 53-monthfollow-up status post radiation therapy to both his prostate and areas of metastatic involvement including thoracic spine for stage IV high-grade adenocarcinoma Gleason 9 (5+4)..Marland Kitchen REFERRING PROVIDER: [facility], [facility]  HPI: Patient is a 76year old male now out 10 months having completed radiation therapy to both his prostate and mid oligometastatic disease to his thoracic spine for Gleason 9 (5+4) adenocarcinoma.  He is seen today in routine follow-up and is doing fair.  His PSA continues to be suppressed at 0.09 minimal increase from 0.054 months ago.  He is currently on Eligard and is under the direction of medical oncology..Marland Kitchen He is having no specific pain at this time.  COMPLICATIONS OF TREATMENT: none  FOLLOW UP COMPLIANCE: keeps appointments   PHYSICAL EXAM:  BP 135/86 (BP Location: Right Arm, Patient Position: Sitting, Cuff Size: Normal)   Pulse 77   Temp 98.2 F (36.8 C) (Tympanic)   Resp 172 Wheelchair-bound male with poor performance status in NAD.  Well-developed well-nourished patient in NAD. HEENT reveals PERLA, EOMI, discs not visualized.  Oral cavity is clear. No oral mucosal lesions are identified. Neck is clear without evidence of cervical or supraclavicular adenopathy. Lungs are clear to A&P. Cardiac examination is essentially unremarkable with regular rate and rhythm without murmur rub or thrill. Abdomen is benign with no organomegaly or masses noted. Motor sensory and DTR levels are equal and symmetric in the upper and lower extremities. Cranial nerves II through XII are grossly intact. Proprioception is intact. No peripheral adenopathy or edema is identified. No motor or sensory levels are noted. Crude visual fields are within normal range.  RADIOLOGY RESULTS: No current films for  review  PLAN: Present time I will turn follow-up care over to medical oncology who will continue ADT therapy and possible other interventions for his castrate sensitive prostate cancer.  I be happy to reevaluate the patient anytime should that be indicated particularly would be interested in if he develops painful bone metastasis which we can palliate.  Patient and family know to call with any concerns.  I would like to take this opportunity to thank you for allowing me to participate in the care of your patient..Noreene Filbert MD

## 2021-09-28 ENCOUNTER — Inpatient Hospital Stay: Payer: Medicare (Managed Care) | Attending: Oncology

## 2021-09-28 DIAGNOSIS — Z807 Family history of other malignant neoplasms of lymphoid, hematopoietic and related tissues: Secondary | ICD-10-CM | POA: Insufficient documentation

## 2021-09-28 DIAGNOSIS — I110 Hypertensive heart disease with heart failure: Secondary | ICD-10-CM | POA: Insufficient documentation

## 2021-09-28 DIAGNOSIS — Z803 Family history of malignant neoplasm of breast: Secondary | ICD-10-CM | POA: Insufficient documentation

## 2021-09-28 DIAGNOSIS — Z87891 Personal history of nicotine dependence: Secondary | ICD-10-CM | POA: Diagnosis not present

## 2021-09-28 DIAGNOSIS — E119 Type 2 diabetes mellitus without complications: Secondary | ICD-10-CM | POA: Diagnosis not present

## 2021-09-28 DIAGNOSIS — Z800 Family history of malignant neoplasm of digestive organs: Secondary | ICD-10-CM | POA: Diagnosis not present

## 2021-09-28 DIAGNOSIS — C7951 Secondary malignant neoplasm of bone: Secondary | ICD-10-CM | POA: Insufficient documentation

## 2021-09-28 DIAGNOSIS — Z1501 Genetic susceptibility to malignant neoplasm of breast: Secondary | ICD-10-CM | POA: Diagnosis not present

## 2021-09-28 DIAGNOSIS — D649 Anemia, unspecified: Secondary | ICD-10-CM | POA: Insufficient documentation

## 2021-09-28 DIAGNOSIS — I700 Atherosclerosis of aorta: Secondary | ICD-10-CM | POA: Diagnosis not present

## 2021-09-28 DIAGNOSIS — C61 Malignant neoplasm of prostate: Secondary | ICD-10-CM | POA: Insufficient documentation

## 2021-09-28 DIAGNOSIS — E785 Hyperlipidemia, unspecified: Secondary | ICD-10-CM | POA: Insufficient documentation

## 2021-09-28 DIAGNOSIS — I509 Heart failure, unspecified: Secondary | ICD-10-CM | POA: Diagnosis not present

## 2021-09-28 DIAGNOSIS — Z5111 Encounter for antineoplastic chemotherapy: Secondary | ICD-10-CM

## 2021-09-28 DIAGNOSIS — R350 Frequency of micturition: Secondary | ICD-10-CM | POA: Insufficient documentation

## 2021-09-28 LAB — CBC WITH DIFFERENTIAL/PLATELET
Abs Immature Granulocytes: 0.01 10*3/uL (ref 0.00–0.07)
Basophils Absolute: 0 10*3/uL (ref 0.0–0.1)
Basophils Relative: 1 %
Eosinophils Absolute: 0.1 10*3/uL (ref 0.0–0.5)
Eosinophils Relative: 2 %
HCT: 37.6 % — ABNORMAL LOW (ref 39.0–52.0)
Hemoglobin: 12 g/dL — ABNORMAL LOW (ref 13.0–17.0)
Immature Granulocytes: 0 %
Lymphocytes Relative: 26 %
Lymphs Abs: 1 10*3/uL (ref 0.7–4.0)
MCH: 30.7 pg (ref 26.0–34.0)
MCHC: 31.9 g/dL (ref 30.0–36.0)
MCV: 96.2 fL (ref 80.0–100.0)
Monocytes Absolute: 0.3 10*3/uL (ref 0.1–1.0)
Monocytes Relative: 9 %
Neutro Abs: 2.2 10*3/uL (ref 1.7–7.7)
Neutrophils Relative %: 62 %
Platelets: 192 10*3/uL (ref 150–400)
RBC: 3.91 MIL/uL — ABNORMAL LOW (ref 4.22–5.81)
RDW: 14.3 % (ref 11.5–15.5)
WBC: 3.6 10*3/uL — ABNORMAL LOW (ref 4.0–10.5)
nRBC: 0 % (ref 0.0–0.2)

## 2021-09-28 LAB — COMPREHENSIVE METABOLIC PANEL
ALT: 14 U/L (ref 0–44)
AST: 22 U/L (ref 15–41)
Albumin: 3.9 g/dL (ref 3.5–5.0)
Alkaline Phosphatase: 61 U/L (ref 38–126)
Anion gap: 7 (ref 5–15)
BUN: 19 mg/dL (ref 8–23)
CO2: 23 mmol/L (ref 22–32)
Calcium: 9.3 mg/dL (ref 8.9–10.3)
Chloride: 109 mmol/L (ref 98–111)
Creatinine, Ser: 0.72 mg/dL (ref 0.61–1.24)
GFR, Estimated: >60 mL/min (ref >60)
Glucose, Bld: 140 mg/dL — ABNORMAL HIGH (ref 70–99)
Potassium: 4.1 mmol/L (ref 3.5–5.1)
Sodium: 139 mmol/L (ref 135–145)
Total Bilirubin: 0.4 mg/dL (ref 0.3–1.2)
Total Protein: 7.4 g/dL (ref 6.5–8.1)

## 2021-09-28 LAB — PSA: Prostatic Specific Antigen: 0.17 ng/mL (ref 0.00–4.00)

## 2021-10-01 ENCOUNTER — Encounter: Payer: Self-pay | Admitting: Oncology

## 2021-10-01 ENCOUNTER — Inpatient Hospital Stay (HOSPITAL_BASED_OUTPATIENT_CLINIC_OR_DEPARTMENT_OTHER): Payer: Medicare (Managed Care) | Admitting: Oncology

## 2021-10-01 VITALS — BP 120/82 | HR 90 | Temp 96.5°F | Wt 187.0 lb

## 2021-10-01 DIAGNOSIS — C7951 Secondary malignant neoplasm of bone: Secondary | ICD-10-CM

## 2021-10-01 DIAGNOSIS — Z5111 Encounter for antineoplastic chemotherapy: Secondary | ICD-10-CM | POA: Diagnosis not present

## 2021-10-01 DIAGNOSIS — C61 Malignant neoplasm of prostate: Secondary | ICD-10-CM | POA: Diagnosis not present

## 2021-10-01 DIAGNOSIS — D649 Anemia, unspecified: Secondary | ICD-10-CM | POA: Diagnosis not present

## 2021-10-01 NOTE — Progress Notes (Signed)
Hematology/Oncology Progress note [phone] [phone]      Patient Care Team: Inc, [facility] as PCP - General Matthew Cowman, MD as Consulting Physician (Cardiology) Matthew Graff, FNP as Nurse Practitioner (Family Medicine) Matthew Server, MD as Consulting Physician (Hematology and Oncology)  ASSESSMENT & PLAN:   Cancer Staging  Prostate cancer [facility]) Staging form: Prostate, AJCC 8th Edition - Clinical stage from 05/11/2020: Stage IVB (cTX, cN0, cM1, PSA: 30) - Signed by Matthew Server, MD on 05/11/2020   Prostate cancer [facility]) #Metastatic prostate cancer to bone, castration sensitive Eligard 45 mg Q6 months with his PCP at [facility] given in November 2022. Recommend him to receive Eligard 45 mg every 6 months. Labs reviewed and discussed with patient.  Continue Darolutamide 600 mg twice daily. PSA nadir 0.01.  Currently PSA level 0.19. Continue monitor Repeat CT and Bone scan.  #NGS showed BRCA 2 S1404, BRIP1 R173C 3X, FOXO1 1S346f. 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 Consider possible rucaparib/olaparib in subsequent lines of treatment  Encounter for antineoplastic chemotherapy Treatment plan as listed above  Anemia Hemoglobin is stable  Monitor.  Orders Placed This Encounter  Procedures   CT CHEST ABDOMEN PELVIS WO CONTRAST    Standing Status:   Future    Standing Expiration Date:   10/02/2022    Order Specific Question:   If indicated for the ordered procedure, I authorize the administration of contrast media per Radiology protocol    Answer:   Yes    Order Specific Question:   Preferred imaging location?    Answer:   [facility]    Order Specific Question:   Is Oral Contrast requested for this exam?    Answer:   Yes, Per Radiology protocol   NM Bone Scan Whole Body    Standing Status:   Future    Standing Expiration Date:   10/01/2022    Order Specific Question:   If indicated  for the ordered procedure, I authorize the administration of a radiopharmaceutical per Radiology protocol    Answer:   Yes    Order Specific Question:   Preferred imaging location?    Answer:   [facility]   CBC with Differential/Platelet    Standing Status:   Future    Standing Expiration Date:   10/02/2022   Comprehensive metabolic panel    Standing Status:   Future    Standing Expiration Date:   10/02/2022   PSA    Standing Status:   Future    Standing Expiration Date:   10/02/2022   Testosterone    Standing Status:   Future    Standing Expiration Date:   10/01/2022    All questions were answered. The patient knows to call the clinic with any problems, questions or concerns.  ZEarlie Server MD, PhD [facility] H[facility] Oncology 10/01/2021   CHIEF COMPLAINTS/REASON FOR VISIT:  metastatic prostate cancer  HISTORY OF PRESENTING ILLNESS:   RDERRIC DEALMEIDAis a  76y.o.  male with PMH listed below was seen in consultation at the request of  [place] [place] for evaluation of metastatic prostate cancer. Patient was accompanied by her sister who he currently lives with.  History was mainly obtained from sister as well as reviewing medical records.  Patient is a poor historian. Per sister, she has noticed patient having increased urgency and frequency of urination.  Patient was evaluated by urologist Dr. SDiamantina Providencein  January 2022.  PSA was elevated at 30.4 in January 2022 04/01/2020, prostate biopsy showed  All cores were positive for high risk prostate cancer, primarily Gleason score 5+4 = 9, with max core involvement of 100% and perineural invasion present. 04/21/2020, CT abdomen pelvis showed a sclerotic lesion in the lumbar spine concerning for probable metastatic disease.  No definitive extra skeletal metastatic disease noted elsewhere in the abdomen or pelvis.  Mild circumferential bladder wall thickening.  Aortic atherosclerosis. 04/27/2020, bone scan showed foci of abnormal  uptake noted in the mid thoracic and upper lumbar spine concerning for metastatic disease.  Patient reports some lower back pain intermittently.  He currently lives with his sister.   Past medical history includes CHF, hypertension, hyper lipidemia, diabetes type 2 Per sister, family history is positive for colon cancer and another brother, multiple myeloma in the other brother.  04/16/2020 Firmagon 240 mg loading dose #06/11/2020 patient went to [facility] for second opinion was seen by Matthew Mcgrath who agrees with continuing ADT with switch to Eligard 45 mg.  He is due to proceed with the treatments today.  Communicated with pace program Matthew Mcgrath, patient will receive treatment at [place]. Matthew Mcgrath also recommends to start darolutamide and authorization process has been initiated at [facility]. #07/06/2020 started darolutamide 600 mg twice daily # definitive treatment for prostate and pelvic node- Discussed with [facility] Matthew Mcgrath and I appreciate his expert opinion of darolutamide concurrent use with RT and ADT # 10/19/2020 finished definitive RT. To prostate cancer and pelvic lymph node treatments. #02/13/2021, CT chest abdomen pelvis without contrast showed slight interval increase in sclerosis of previously osseous metastasis involving lumbar spine, most consistent with posttreatment sclerosis.  Focally severe disc degeneration and sclerosis versus sclerotic osseous metastatic of T6 and 7.  Attention on follow-up.  No noncontrast evidence of lymphadenopathy or soft tissue metastatic disease in chest abdomen pelvis.  Prostamegaly.  Enlargement of main pulmonary artery.  Coronary artery disease.  Patient has genetic testing done which showed BRCA2 mutation.  03/11/2021, bone scan showed improved/decreased uptake in T6 and L2. 06/18/2021 - 06/21/2021, patient was admitted due to complicated UTI, gross hematuria.Marland Kitchen ' INTERVAL HISTORY Matthew Mcgrath is a 76 y.o. male who has above history reviewed by me today presents for follow up  visit for management of metastatic prostate cancer Patient was accompanied by his sister.  April 2023, Eligard injections via primary care provider's office. He is currently Darolutamide 600 mg twice daily denies any new complaints.  Denies any  new bone pain.  Chronic back pain unchanged.  Review of Systems  Constitutional:  Positive for fatigue. Negative for appetite change, chills, fever and unexpected weight change.  HENT:   Negative for hearing loss and voice change.   Eyes:  Negative for eye problems and icterus.  Respiratory:  Negative for chest tightness, cough and shortness of breath.   Cardiovascular:  Negative for chest pain and leg swelling.  Gastrointestinal:  Negative for abdominal distention and abdominal pain.  Endocrine: Negative for hot flashes.  Genitourinary:  Negative for difficulty urinating, dysuria and frequency.   Musculoskeletal:  Positive for back pain. Negative for arthralgias.  Skin:  Negative for itching and rash.  Neurological:  Negative for light-headedness and numbness.  Hematological:  Negative for adenopathy. Does not bruise/bleed easily.  Psychiatric/Behavioral:  Negative for confusion.     MEDICAL HISTORY:  Past Medical History:  Diagnosis Date   Cardiomyopathy ([place])    Congestive heart failure ([facility])    Diabetes mellitus type 2,  controlled ([place])    Elevated lipids    Family history of breast cancer    Family history of colon cancer    HTN (hypertension)     SURGICAL HISTORY: Past Surgical History:  Procedure Laterality Date   CARDIAC CATHETERIZATION N/A 04/14/2015   Procedure: Left Heart Cath and Coronary Angiography;  Surgeon: Matthew Cowman, MD;  Location: [facility];  Service: Cardiovascular;  Laterality: N/A;   CATARACT EXTRACTION W/ INTRAOCULAR LENS  IMPLANT, BILATERAL Left    COLONOSCOPY WITH PROPOFOL N/A 04/08/2020   Procedure: COLONOSCOPY WITH PROPOFOL;  Surgeon: Toledo, Benay Pike, MD;  Location: [facility];  Service:  Gastroenterology;  Laterality: N/A;   CYSTOSCOPY W/ RETROGRADES Bilateral 07/02/2021   Procedure: CYSTOSCOPY WITH RETROGRADE PYELOGRAM;  Surgeon: Billey Co, MD;  Location: [facility] OR[facility];  Service: Urology;  Laterality: Bilateral;   HERNIA REPAIR     IMPLANTABLE CARDIOVERTER DEFIBRILLATOR (ICD) GENERATOR CHANGE Left 07/03/2015   Procedure: ICD IMPLANT single chamber;  Surgeon: Marzetta Board, MD;  Location: [facility] ORS;  Service: Cardiovascular;  Laterality: Left;   TRANSURETHRAL RESECTION OF BLADDER TUMOR N/A 07/02/2021   Procedure: TRANSURETHRAL RESECTION OF BLADDER TUMOR (TURBT);  Surgeon: Billey Co, MD;  Location: [facility];  Service: Urology;  Laterality: N/A;    SOCIAL HISTORY: Social History   Socioeconomic History   Marital status: Single    Spouse name: Not on file   Number of children: Not on file   Years of education: Not on file   Highest education level: Not on file  Occupational History   Not on file  Tobacco Use   Smoking status: Former    Packs/day: 0.25    Years: 30.00    Total pack years: 7.50    Types: Cigarettes    Quit date: 02/11/2020    Years since quitting: 1.6   Smokeless tobacco: Never  Vaping Use   Vaping Use: Never used  Substance and Sexual Activity   Alcohol use: No   Drug use: Not Currently    Frequency: 2.0 times per week    Types: Marijuana    Comment: past use   Sexual activity: Not on file  Other Topics Concern   Not on file  Social History Narrative   Not on file   Social Determinants of Health   Financial Resource Strain: Not on file  Food Insecurity: Not on file  Transportation Needs: Not on file  Physical Activity: Not on file  Stress: Not on file  Social Connections: Not on file  Intimate Partner Violence: Not on file    FAMILY HISTORY: Family History  Problem Relation Age of Onset   Heart failure Mother    Heart disease Mother    Alcoholism Father    Hypertension Sister    Diabetes Sister    Hypertension Brother     Diabetes Brother    Colon cancer Brother        vs prostate   Multiple myeloma Brother    Breast cancer Cousin     ALLERGIES:  has No Known Allergies.  MEDICATIONS:  Current Outpatient Medications  Medication Sig Dispense Refill   acetaminophen (TYLENOL) 650 MG CR tablet Take 1,300 mg by mouth 2 (two) times daily.     carvedilol (COREG) 6.25 MG tablet Take 3.125 mg by mouth 2 (two) times daily.     cholecalciferol (VITAMIN D3) 25 MCG (1000 UNIT) tablet Take 1,000 Units by mouth daily.     darolutamide (NUBEQA) 300 MG tablet  Take 600 mg by mouth 2 (two) times daily with a meal.     glucose blood (TRUE METRIX BLOOD GLUCOSE TEST) test strip 1 each by Other route as needed for other. Use as instructed     GLUMETZA 1000 MG 24 hr tablet Take 1,000 mg by mouth daily.     JARDIANCE 10 MG TABS tablet Take 10 mg by mouth daily.     LEUPROLIDE ACETATE, 6 MONTH, 45 MG injection Inject into the skin.     LIDODERM 5 % Apply 1 patch to skin every morning as needed for L lower back pain. Remove after 12 hours. Call for refills     lisinopril (ZESTRIL) 2.5 MG tablet Take 2.5 mg by mouth daily.     magnesium oxide (MAG-OX) 400 (240 Mg) MG tablet Take 1 tablet by mouth 2 (two) times daily.     metFORMIN (GLUCOPHAGE-XR) 500 MG 24 hr tablet Take 500 mg by mouth every evening.     Multiple Vitamin (MULTIVITAMIN ADULT PO) SMARTSIG:1 Tablet(s) By Mouth Daily     Omega-3 Fatty Acids (OMEGA III EPA+DHA) 1000 MG CAPS Take 1 capsule by mouth 2 (two) times daily.     spironolactone (ALDACTONE) 25 MG tablet Take 0.5 tablets (12.5 mg total) by mouth daily.     tamsulosin (FLOMAX) 0.4 MG CAPS capsule Take 1 capsule (0.4 mg total) by mouth daily. 30 capsule 2   TRUEplus Lancets 28G MISC by Does not apply route.     No current facility-administered medications for this visit.     PHYSICAL EXAMINATION: ECOG PERFORMANCE STATUS: 1 - Symptomatic but completely ambulatory Vitals:   10/01/21 1002  BP: 120/82   Pulse: 90  Temp: (!) 96.5 F (35.8 C)   Filed Weights   10/01/21 1002  Weight: 187 lb (84.8 kg)    Physical Exam Constitutional:      General: He is not in acute distress. HENT:     Head: Normocephalic and atraumatic.  Eyes:     General: No scleral icterus. Cardiovascular:     Rate and Rhythm: Normal rate and regular rhythm.     Heart sounds: Normal heart sounds.  Pulmonary:     Effort: Pulmonary effort is normal. No respiratory distress.     Breath sounds: No wheezing.  Abdominal:     General: Bowel sounds are normal. There is no distension.     Palpations: Abdomen is soft.  Musculoskeletal:        General: No deformity. Normal range of motion.     Cervical back: Normal range of motion and neck supple.  Skin:    General: Skin is warm and dry.     Findings: No erythema or rash.  Neurological:     Mental Status: He is alert and oriented to person, place, and time. Mental status is at baseline.     Cranial Nerves: No cranial nerve deficit.     Coordination: Coordination normal.  Psychiatric:        Mood and Affect: Mood normal.     LABORATORY DATA:  I have reviewed the data as listed Lab Results  Component Value Date   WBC 3.6 (L) 09/28/2021   HGB 12.0 (L) 09/28/2021   HCT 37.6 (L) 09/28/2021   MCV 96.2 09/28/2021   PLT 192 09/28/2021   Recent Labs    06/18/21 2000 06/19/21 0623 06/21/21 0531 07/27/21 1057 09/28/21 1347  NA 138   < > 136 134* 139  K 4.0   < >  3.4* 4.0 4.1  CL 107   < > 102 102 109  CO2 22   < > 25 24 23   GLUCOSE 191*   < > 135* 186* 140*  BUN 25*   < > 18 12 19   CREATININE 0.93   < > 0.67 0.73 0.72  CALCIUM 9.4   < > 8.8* 9.0 9.3  GFRNONAA >60   < > >60 >60 >60  PROT 7.4  --   --  7.8 7.4  ALBUMIN 3.8  --   --  3.8 3.9  AST 23  --   --  21 22  ALT 15  --   --  12 14  ALKPHOS 61  --   --  59 61  BILITOT 0.6  --   --  0.7 0.4   < > = values in this interval not displayed.    Iron/TIBC/Ferritin/ %Sat    Component Value  Date/Time   IRON 82 11/23/2020 0953   TIBC 335 11/23/2020 0953   FERRITIN 221 11/23/2020 0953   IRONPCTSAT 25 11/23/2020 0953     RADIOGRAPHIC STUDIES: I have personally reviewed the radiological images as listed and agreed with the findings in the report. No results found.

## 2021-10-01 NOTE — Assessment & Plan Note (Signed)
Treatment plan as listed above. 

## 2021-10-01 NOTE — Assessment & Plan Note (Signed)
#  Metastatic prostate cancer to bone, castration sensitive Eligard 45 mg Q6 months with his PCP at [facility] given in November 2022. Recommend him to receive Eligard 45 mg every 6 months. Labs reviewed and discussed with patient.  Continue Darolutamide 600 mg twice daily. PSA nadir 0.01.  Currently PSA level 0.19. Continue monitor Repeat CT and Bone scan.  #NGS showed BRCA 2 S1404, BRIP1 R173C 3X, FOXO1 1S367fs. 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 Consider possible rucaparib/olaparib in subsequent lines of treatment

## 2021-10-01 NOTE — Assessment & Plan Note (Signed)
Hemoglobin is stable.  Monitor. 

## 2021-10-01 NOTE — Assessment & Plan Note (Deleted)
#  Metastatic prostate cancer to bone, castration sensitive Eligard 45 mg Q6 months with his PCP at [facility] given in November 2022. Recommend him to receive Eligard 45 mg every 6 months. Labs reviewed and discussed with patient.  Continue Darolutamide 600 mg twice daily. PSA nadir 0.01.  Currently PSA level 0.19. Continue monitor Repeat CT and Bone scan.  #NGS showed BRCA 2 S1404, BRIP1 R173C 3X, FOXO1 1S33fs. 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 Consider possible rucaparib/olaparib in subsequent lines of treatment

## 2021-10-07 ENCOUNTER — Ambulatory Visit (INDEPENDENT_AMBULATORY_CARE_PROVIDER_SITE_OTHER): Payer: Medicare (Managed Care) | Admitting: Urology

## 2021-10-07 VITALS — BP 117/77 | HR 75 | Ht 66.0 in | Wt 186.7 lb

## 2021-10-07 DIAGNOSIS — Z8551 Personal history of malignant neoplasm of bladder: Secondary | ICD-10-CM | POA: Diagnosis not present

## 2021-10-07 LAB — URINALYSIS, COMPLETE
Bilirubin, UA: NEGATIVE
Leukocytes,UA: NEGATIVE
Nitrite, UA: NEGATIVE
Protein,UA: NEGATIVE
RBC, UA: NEGATIVE
Specific Gravity, UA: 1.01 (ref 1.005–1.030)
Urobilinogen, Ur: 0.2 mg/dL (ref 0.2–1.0)
pH, UA: 5 (ref 5.0–7.5)

## 2021-10-07 LAB — MICROSCOPIC EXAMINATION: Bacteria, UA: NONE SEEN

## 2021-10-07 MED ORDER — SULFAMETHOXAZOLE-TRIMETHOPRIM 800-160 MG PO TABS
1.0000 | ORAL_TABLET | Freq: Once | ORAL | Status: AC
  Administered 2021-10-07: 1 via ORAL

## 2021-10-07 MED ORDER — LIDOCAINE HCL URETHRAL/MUCOSAL 2 % EX GEL
1.0000 | Freq: Once | CUTANEOUS | Status: AC
  Administered 2021-10-07: 1 via URETHRAL

## 2021-10-07 NOTE — Progress Notes (Signed)
Bladder cancer surveillance note  INDICATION History of bladder cancer  UROLOGIC HISTORY Matthew Mcgrath is a 76 y.o. male comorbid male with history of metastatic prostate cancer treated with radiation, ADT, and managed by oncology, recently found to have gross hematuria and was diagnosed with a 2.5 cm bladder tumor.  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  With his comorbidities and frailty, second look TURBT and other intravesical treatments deferred.  Initial Diagnosis of Bladder  Year: 07/02/2021 Pathology: HG T1 urothelial cell carcinoma  Recurrent Bladder Cancer Diagnosis None  Treatments for Bladder Cancer 07/02/2021: TURBT  AUA Risk Category High  Cystoscopy Procedure Note:  Bactrim given for prophylaxis.  Urinalysis today benign, and he denies any urinary symptoms.  After informed consent and discussion of the procedure and its risks, Matthew Mcgrath was positioned and prepped in the standard fashion. Cystoscopy was performed with the a flexible cystoscope. The urethra, bladder neck and entire bladder was visualized in a standard fashion.  There was scar tissue at the lateral wall at prior resection site with some minimal granulation tissue but no evidence of recurrence.  The ureteral orifices were visualized in their normal location and orientation.  No abnormalities on retroflexion, cytology deferred with his comorbidities.  RTC 4 months cystoscopy Continue follow-up with oncology for metastatic prostate cancer   Nickolas Madrid, MD 10/07/2021

## 2021-11-26 ENCOUNTER — Ambulatory Visit (INDEPENDENT_AMBULATORY_CARE_PROVIDER_SITE_OTHER): Payer: Medicare (Managed Care) | Admitting: Vascular Surgery

## 2021-11-26 ENCOUNTER — Encounter (INDEPENDENT_AMBULATORY_CARE_PROVIDER_SITE_OTHER): Payer: Medicare (Managed Care)

## 2021-12-07 ENCOUNTER — Ambulatory Visit
Admission: RE | Admit: 2021-12-07 | Discharge: 2021-12-07 | Disposition: A | Discharge status: Home / Self Care | Payer: Medicare (Managed Care) | Source: Ambulatory Visit | Attending: Oncology | Admitting: Oncology

## 2021-12-07 ENCOUNTER — Encounter
Admission: RE | Admit: 2021-12-07 | Discharge: 2021-12-07 | Disposition: A | Discharge status: Home / Self Care | Payer: Medicare (Managed Care) | Source: Ambulatory Visit | Attending: Oncology | Admitting: Oncology

## 2021-12-07 ENCOUNTER — Inpatient Hospital Stay: Payer: Medicare (Managed Care) | Attending: Oncology

## 2021-12-07 DIAGNOSIS — Z87891 Personal history of nicotine dependence: Secondary | ICD-10-CM | POA: Insufficient documentation

## 2021-12-07 DIAGNOSIS — Z800 Family history of malignant neoplasm of digestive organs: Secondary | ICD-10-CM | POA: Insufficient documentation

## 2021-12-07 DIAGNOSIS — C7951 Secondary malignant neoplasm of bone: Secondary | ICD-10-CM | POA: Insufficient documentation

## 2021-12-07 DIAGNOSIS — Z807 Family history of other malignant neoplasms of lymphoid, hematopoietic and related tissues: Secondary | ICD-10-CM | POA: Insufficient documentation

## 2021-12-07 DIAGNOSIS — C61 Malignant neoplasm of prostate: Secondary | ICD-10-CM | POA: Insufficient documentation

## 2021-12-07 DIAGNOSIS — Z79899 Other long term (current) drug therapy: Secondary | ICD-10-CM | POA: Insufficient documentation

## 2021-12-07 DIAGNOSIS — Z803 Family history of malignant neoplasm of breast: Secondary | ICD-10-CM | POA: Diagnosis not present

## 2021-12-07 DIAGNOSIS — I110 Hypertensive heart disease with heart failure: Secondary | ICD-10-CM | POA: Insufficient documentation

## 2021-12-07 DIAGNOSIS — I509 Heart failure, unspecified: Secondary | ICD-10-CM | POA: Diagnosis not present

## 2021-12-07 DIAGNOSIS — E119 Type 2 diabetes mellitus without complications: Secondary | ICD-10-CM | POA: Diagnosis not present

## 2021-12-07 LAB — CBC WITH DIFFERENTIAL/PLATELET
Abs Immature Granulocytes: 0 10*3/uL (ref 0.00–0.07)
Basophils Absolute: 0 10*3/uL (ref 0.0–0.1)
Basophils Relative: 0 %
Eosinophils Absolute: 0.1 10*3/uL (ref 0.0–0.5)
Eosinophils Relative: 1 %
HCT: 36.5 % — ABNORMAL LOW (ref 39.0–52.0)
Hemoglobin: 11.7 g/dL — ABNORMAL LOW (ref 13.0–17.0)
Immature Granulocytes: 0 %
Lymphocytes Relative: 28 %
Lymphs Abs: 1 10*3/uL (ref 0.7–4.0)
MCH: 30.7 pg (ref 26.0–34.0)
MCHC: 32.1 g/dL (ref 30.0–36.0)
MCV: 95.8 fL (ref 80.0–100.0)
Monocytes Absolute: 0.3 10*3/uL (ref 0.1–1.0)
Monocytes Relative: 7 %
Neutro Abs: 2.2 10*3/uL (ref 1.7–7.7)
Neutrophils Relative %: 64 %
Platelets: 204 10*3/uL (ref 150–400)
RBC: 3.81 MIL/uL — ABNORMAL LOW (ref 4.22–5.81)
RDW: 14.2 % (ref 11.5–15.5)
WBC: 3.5 10*3/uL — ABNORMAL LOW (ref 4.0–10.5)
nRBC: 0 % (ref 0.0–0.2)

## 2021-12-07 LAB — COMPREHENSIVE METABOLIC PANEL
ALT: 14 U/L (ref 0–44)
AST: 20 U/L (ref 15–41)
Albumin: 3.8 g/dL (ref 3.5–5.0)
Alkaline Phosphatase: 58 U/L (ref 38–126)
Anion gap: 7 (ref 5–15)
BUN: 16 mg/dL (ref 8–23)
CO2: 24 mmol/L (ref 22–32)
Calcium: 9 mg/dL (ref 8.9–10.3)
Chloride: 103 mmol/L (ref 98–111)
Creatinine, Ser: 0.7 mg/dL (ref 0.61–1.24)
GFR, Estimated: >60 mL/min (ref >60)
Glucose, Bld: 145 mg/dL — ABNORMAL HIGH (ref 70–99)
Potassium: 3.9 mmol/L (ref 3.5–5.1)
Sodium: 134 mmol/L — ABNORMAL LOW (ref 135–145)
Total Bilirubin: 0.5 mg/dL (ref 0.3–1.2)
Total Protein: 7.4 g/dL (ref 6.5–8.1)

## 2021-12-07 LAB — PSA: Prostatic Specific Antigen: 0.45 ng/mL (ref 0.00–4.00)

## 2021-12-07 MED ORDER — TECHNETIUM TC 99M MEDRONATE IV KIT
20.0000 | PACK | Freq: Once | INTRAVENOUS | Status: AC | PRN
  Administered 2021-12-07: 21.89 via INTRAVENOUS

## 2021-12-08 LAB — TESTOSTERONE: Testosterone: <3 ng/dL — ABNORMAL LOW (ref 264–916)

## 2021-12-10 ENCOUNTER — Inpatient Hospital Stay (HOSPITAL_BASED_OUTPATIENT_CLINIC_OR_DEPARTMENT_OTHER): Payer: Medicare (Managed Care) | Admitting: Oncology

## 2021-12-10 ENCOUNTER — Encounter: Payer: Self-pay | Admitting: Oncology

## 2021-12-10 DIAGNOSIS — C61 Malignant neoplasm of prostate: Secondary | ICD-10-CM | POA: Diagnosis not present

## 2021-12-10 DIAGNOSIS — C7951 Secondary malignant neoplasm of bone: Secondary | ICD-10-CM | POA: Diagnosis not present

## 2021-12-10 DIAGNOSIS — Z5111 Encounter for antineoplastic chemotherapy: Secondary | ICD-10-CM | POA: Diagnosis not present

## 2021-12-10 NOTE — Progress Notes (Signed)
Hematology/Oncology Progress note [phone] [phone]      Patient Care Team: Inc, [facility] as PCP - General [name], MD as Consulting Physician (Cardiology) Alisa Graff, FNP as Nurse Practitioner (Family Medicine) [name], MD as Consulting Physician (Hematology and Oncology)  ASSESSMENT & PLAN:   Cancer Staging  Prostate cancer [facility]) Staging form: Prostate, AJCC 8th Edition - Clinical stage from 05/11/2020: Stage IVB (cTX, cN0, cM1, PSA: 30) - Signed by [name], MD on 05/11/2020   Prostate cancer [facility]) #Metastatic prostate cancer to bone, castration sensitive Eligard 45 mg Q6 months with his PCP at [facility] given in November 2022. Recommend him to receive Eligard 45 mg every 6 months. Labs reviewed and discussed with patient.   Continue Darolutamide 600 mg twice daily. PSA nadir 0.01. he is developing castration resistance disease. - possible new bone lesion Continue current regimen and close monitor.   #NGS showed BRCA 2 S1404, BRIP1 R173C 3X, FOXO1 1S326f. 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 Consider possible rucaparib/olaparib in subsequent lines of treatment  Encounter for antineoplastic chemotherapy Treatment plan as listed above  [place]astasis [place] bone [place] Asymptomatic. Consider palliative RT if he develops symptoms.  Orders Placed This Encounter  Procedures   CBC with Differential/Platelet    Standing Status:   Future    Standing Expiration Date:   12/11/2022   Comprehensive metabolic panel    Standing Status:   Future    Standing Expiration Date:   12/10/2022   PSA    Standing Status:   Future    Standing Expiration Date:   12/11/2022   Testosterone    Standing Status:   Future    Standing Expiration Date:   12/10/2022   Follow up 3 months All questions were answered. The patient knows to call the clinic with any problems, questions or concerns.  ZEarlie Server MD, PhD [facility] Oncology 12/10/2021   CHIEF COMPLAINTS/REASON FOR VISIT:  metastatic prostate cancer  HISTORY OF PRESENTING ILLNESS:   RSCHUYLER OLDENis a  76y.o.  male with PMH listed below was seen in consultation at the request of  Inc, [place] for evaluation of metastatic prostate cancer. Oncology History  [place]  04/16/2020 Procedure   Firmagon 240 mg loading dose   04/21/2020 Imaging   CT abdomen pelvis showed a sclerotic lesion in the lumbar spine concerning for probable metastatic disease.  No definitive extra skeletal metastatic disease noted elsewhere in the abdomen or pelvis.  Mild circumferential bladder wall thickening.  Aortic atherosclerosis.   05/11/2020 Initial Diagnosis   Prostate cancer   04/01/2020, prostate biopsy showed  All cores were positive for high risk prostate cancer, primarily Gleason score 5+4 = 9, with max core involvement of 100% and perineural invasion present.   05/11/2020 Cancer Staging   Staging form: Prostate, AJCC 8th Edition - Clinical stage from 05/11/2020: Stage IVB (cTX, cN0, cM1, PSA: 30) - Signed by YEarlie Server MD on 05/11/2020 Prostate specific antigen (PSA) range: 20 or greater   10/19/2020 -  Radiation Therapy   finished definitive RT. to prostate cancer and pelvic lymph node treatments.   02/13/2021 Imaging   CT chest abdomen pelvis without contrast showed slight interval increase in sclerosis of previously osseous metastasis involving lumbar spine, most consistent with posttreatment sclerosis.  Focally severe disc degeneration and sclerosis versus sclerotic osseous metastatic of T6 and 7.  Attention  on follow-up.  No noncontrast evidence of lymphadenopathy or soft tissue metastatic disease in chest abdomen pelvis.  Prostamegaly.  Enlargement of main pulmonary artery.  Coronary artery disease.   02/21/2021 Genetic Testing   [employer] Multi-Cancer+RNA cancer panel found a single, pathogenic variant in  BRCA2 called [id number]. The remainder of testing was negative/normal.      03/11/2021 Imaging   , bone scan showed improved/decreased uptake in T6 and L2   04/27/2021 Imaging   Bone scan bone scan showed foci of abnormal uptake noted in the mid thoracic and upper lumbar spine concerning for metastatic disease   12/07/2021 Imaging   CT chest abdomen pelvis wo contrast 1. Mild prostatomegaly without discrete mass appreciated by CT. 2. Unchanged sclerotic osseous metastatic disease, for example of the L1 and L2 vertebral bodies. 3. Unchanged, focally severe disc degenerative disease at T6-T7 with sclerosis of the vertebral bodies, most likely degenerative change although underlying osseous metastasis again not excluded. Continued attention on follow-up. 4. No noncontrast evidence of lymphadenopathy or soft tissue metastatic disease to the chest, abdomen, or pelvis. 5. Enlargement of the main pulmonary artery, as can be seen in pulmonary hypertension. 6. Coronary artery disease.   12/07/2021 Imaging   Bone scan showed 1. There is faint activity in the upper lumbar region, approximate L2 level, likely corresponds to faint sclerosis/metastasis on CT 2. New focus of activity at the left inferior sternum and right sixth rib anteriorly, possibly metastatic but no definitive corresponding lesion on CT. 3. New prominent activity at the left first rib end/sternoclavicular junction potentially degenerative, given appearance on CT. 4. Probable degenerative uptake at the bilateral feet, shoulders,and midthoracic spine   #06/11/2020 patient went to [facility] for second opinion was seen by [name] who agrees with continuing ADT with switch to Eligard 45 mg.  He is due to proceed with the treatments today.  Communicated with pace program [name], patient will receive treatment at [place]. [name] also recommends to start darolutamide and authorization process has been initiated at [facility]. #07/06/2020 started darolutamide 600  mg twice daily # definitive treatment for prostate and pelvic node- Discussed with [facility] [name] and I appreciate his expert opinion of darolutamide concurrent use with RT and ADT  INTERVAL HISTORY DAVARION CUFFEE is a 76 y.o. male who has above history reviewed by me today presents for follow up visit for management of metastatic prostate cancer Patient was accompanied by his brother.  He is currently on Darolutamide 600 mg twice daily denies any new complaints.  HE denies any new bone pain.  Chronic back pain unchanged.  Review of Systems  Constitutional:  Positive for fatigue. Negative for appetite change, chills, fever and unexpected weight change.  HENT:   Negative for hearing loss and voice change.   Eyes:  Negative for eye problems and icterus.  Respiratory:  Negative for chest tightness, cough and shortness of breath.   Cardiovascular:  Negative for chest pain and leg swelling.  Gastrointestinal:  Negative for abdominal distention and abdominal pain.  Endocrine: Negative for hot flashes.  Genitourinary:  Negative for difficulty urinating, dysuria and frequency.   Musculoskeletal:  Positive for back pain. Negative for arthralgias.  Skin:  Negative for itching and rash.  Neurological:  Negative for light-headedness and numbness.  Hematological:  Negative for adenopathy. Does not bruise/bleed easily.  Psychiatric/Behavioral:  Negative for confusion.     MEDICAL HISTORY:  Past Medical History:  Diagnosis Date   Cardiomyopathy ([place])    Congestive heart failure ([place])  Diabetes mellitus type 2, controlled ([place])    Elevated lipids    Family history of breast cancer    Family history of colon cancer    HTN (hypertension)     SURGICAL HISTORY: Past Surgical History:  Procedure Laterality Date   CARDIAC CATHETERIZATION N/A 04/14/2015   Procedure: Left Heart Cath and Coronary Angiography;  Surgeon: [name], MD;  Location: [facility];  Service: Cardiovascular;   Laterality: N/A;   CATARACT EXTRACTION W/ INTRAOCULAR LENS  IMPLANT, BILATERAL Left    COLONOSCOPY WITH PROPOFOL N/A 04/08/2020   Procedure: COLONOSCOPY WITH PROPOFOL;  Surgeon: Toledo, Benay Pike, MD;  Location: [facility] ENDOSCOPY;  Service: Gastroenterology;  Laterality: N/A;   CYSTOSCOPY W/ RETROGRADES Bilateral 07/02/2021   Procedure: CYSTOSCOPY WITH RETROGRADE PYELOGRAM;  Surgeon: Billey Co, MD;  Location: [facility] ORS;  Service: Urology;  Laterality: Bilateral;   HERNIA REPAIR     IMPLANTABLE CARDIOVERTER DEFIBRILLATOR (ICD) GENERATOR CHANGE Left 07/03/2015   Procedure: ICD IMPLANT single chamber;  Surgeon: Marzetta Board, MD;  Location: [facility] ORS;  Service: Cardiovascular;  Laterality: Left;   TRANSURETHRAL RESECTION OF BLADDER TUMOR N/A 07/02/2021   Procedure: TRANSURETHRAL RESECTION OF BLADDER TUMOR (TURBT);  Surgeon: Billey Co, MD;  Location: [facility] ORS;  Service: Urology;  Laterality: N/A;    SOCIAL HISTORY: Social History   Socioeconomic History   Marital status: Single    Spouse name: Not on file   Number of children: Not on file   Years of education: Not on file   Highest education level: Not on file  Occupational History   Not on file  Tobacco Use   Smoking status: Former    Packs/day: 0.25    Years: 30.00    Total pack years: 7.50    Types: Cigarettes    Quit date: 02/11/2020    Years since quitting: 1.8   Smokeless tobacco: Never  Vaping Use   Vaping Use: Never used  Substance and Sexual Activity   Alcohol use: No   Drug use: Not Currently    Frequency: 2.0 times per week    Types: Marijuana    Comment: past use   Sexual activity: Not on file  Other Topics Concern   Not on file  Social History Narrative   Not on file   Social Determinants of Health   Financial Resource Strain: Not on file  Food Insecurity: Not on file  Transportation Needs: Not on file  Physical Activity: Not on file  Stress: Not on file  Social Connections: Not on file  Intimate  Partner Violence: Not on file    FAMILY HISTORY: Family History  Problem Relation Age of Onset   Heart failure Mother    Heart disease Mother    Alcoholism Father    Hypertension Sister    Diabetes Sister    Hypertension Brother    Diabetes Brother    Colon cancer Brother        vs prostate   Multiple myeloma Brother    Breast cancer Cousin     ALLERGIES:  has No Known Allergies.  MEDICATIONS:  Current Outpatient Medications  Medication Sig Dispense Refill   acetaminophen (TYLENOL) 650 MG CR tablet SMARTSIG:2 Tablet(s) By Mouth Twice Daily     ASPIRIN LOW DOSE 81 MG tablet Take 81 mg by mouth daily.     carvedilol (COREG) 3.125 MG tablet Take 3.125 mg by mouth 2 (two) times daily.     cholecalciferol (VITAMIN D3) 25 MCG (1000  UNIT) tablet Take 1,000 Units by mouth daily.     darolutamide (NUBEQA) 300 MG tablet Take 600 mg by mouth 2 (two) times daily with a meal.     glucose blood (TRUE METRIX BLOOD GLUCOSE TEST) test strip 1 each by Other route as needed for other. Use as instructed     GLUMETZA 1000 MG 24 hr tablet Take 1,000 mg by mouth daily.     JARDIANCE 10 MG TABS tablet Take 10 mg by mouth daily.     LEUPROLIDE ACETATE, 6 MONTH, 45 MG injection Inject into the skin.     LIDODERM 5 % Apply 1 patch to skin every morning as needed for L lower back pain. Remove after 12 hours. Call for refills     lisinopril (ZESTRIL) 2.5 MG tablet Take 2.5 mg by mouth daily.     magnesium oxide (MAG-OX) 400 (240 Mg) MG tablet Take 1 tablet by mouth 2 (two) times daily.     metFORMIN (GLUCOPHAGE-XR) 500 MG 24 hr tablet Take 500 mg by mouth every evening.     Multiple Vitamin (ONE-DAILY MULTI-VITAMIN) TABS Take 1 tablet by mouth daily.     Omega-3 Fatty Acids (OMEGA III EPA+DHA) 1000 MG CAPS Take 1 capsule by mouth 2 (two) times daily.     spironolactone (ALDACTONE) 25 MG tablet Take 0.5 tablets (12.5 mg total) by mouth daily.     tamsulosin (FLOMAX) 0.4 MG CAPS capsule Take 1 capsule (0.4  mg total) by mouth daily. 30 capsule 2   TRUEplus Lancets 28G MISC by Does not apply route.     magnesium oxide (MAG-OX) 400 MG tablet Take 1 tablet by mouth 2 (two) times daily. (Patient not taking: Reported on 12/10/2021)     No current facility-administered medications for this visit.     PHYSICAL EXAMINATION: ECOG PERFORMANCE STATUS: 1 - Symptomatic but completely ambulatory Vitals:   12/10/21 1008  BP: 128/80  Pulse: 85  Resp: 18  Temp: (!) 96.3 F (35.7 C)   Filed Weights   12/10/21 1008  Weight: 184 lb 6.4 oz (83.6 kg)    Physical Exam Constitutional:      General: He is not in acute distress. HENT:     Head: Normocephalic and atraumatic.  Eyes:     General: No scleral icterus. Cardiovascular:     Rate and Rhythm: Normal rate and regular rhythm.     Heart sounds: Normal heart sounds.  Pulmonary:     Effort: Pulmonary effort is normal. No respiratory distress.     Breath sounds: No wheezing.  Abdominal:     General: Bowel sounds are normal. There is no distension.     Palpations: Abdomen is soft.  Musculoskeletal:        General: No deformity. Normal range of motion.     Cervical back: Normal range of motion and neck supple.  Skin:    General: Skin is warm and dry.     Findings: No erythema or rash.  Neurological:     Mental Status: He is alert and oriented to person, place, and time. Mental status is at baseline.     Cranial Nerves: No cranial nerve deficit.     Coordination: Coordination normal.  Psychiatric:        Mood and Affect: Mood normal.     LABORATORY DATA:  I have reviewed the data as listed Lab Results  Component Value Date   WBC 3.5 (L) 12/07/2021   HGB 11.7 (L) 12/07/2021  HCT 36.5 (L) 12/07/2021   MCV 95.8 12/07/2021   PLT 204 12/07/2021   Recent Labs    07/27/21 1057 09/28/21 1347 12/07/21 1449  NA 134* 139 134*  K 4.0 4.1 3.9  CL 102 109 103  CO2 24 23 24   GLUCOSE 186* 140* 145*  BUN 12 19 16   CREATININE 0.73 0.72 0.70   CALCIUM 9.0 9.3 9.0  GFRNONAA >60 >60 >60  PROT 7.8 7.4 7.4  ALBUMIN 3.8 3.9 3.8  AST 21 22 20   ALT 12 14 14   ALKPHOS 59 61 58  BILITOT 0.7 0.4 0.5    Iron/TIBC/Ferritin/ %Sat    Component Value Date/Time   IRON 82 11/23/2020 0953   TIBC 335 11/23/2020 0953   FERRITIN 221 11/23/2020 0953   IRONPCTSAT 25 11/23/2020 0953     RADIOGRAPHIC STUDIES: I have personally reviewed the radiological images as listed and agreed with the findings in the report. NM Bone Scan Whole Body  Result Date: 12/09/2021 CLINICAL DATA:  Prostate cancer EXAM: NUCLEAR MEDICINE WHOLE BODY BONE SCAN TECHNIQUE: Whole body anterior and posterior images were obtained approximately 3 hours after intravenous injection of radiopharmaceutical. RADIOPHARMACEUTICALS:  21.89 mCi Technetium-19mMDP IV COMPARISON:  CT 12/07/2021, bone scan 03/11/2021, 04/27/2020 FINDINGS: Physiologic renal and bladder activity with probable small urine contamination inferior to the pelvis. New focus of activity involving the left inferior sternum. New focus of activity in the region of left first rib end/sternal clavicular junction. Faint activity at the lumbar spine in the region of L2. Faint activity at the midthoracic spine, likely corresponds to degenerative changes. New focus of activity involving the right sixth rib on oblique view. Similar lower neck activity on anterior views possibly related to thyroid or thyroid cartilage. Mild asymmetrical activity in the right femoral shaft. Likely degenerative uptake at the feet and shoulders. IMPRESSION: 1. There is faint activity in the upper lumbar region, approximate L2 level, likely corresponds to faint sclerosis/metastasis on CT 2. New focus of activity at the left inferior sternum and right sixth rib anteriorly, possibly metastatic but no definitive corresponding lesion on CT. 3. New prominent activity at the left first rib end/sternoclavicular junction potentially degenerative, given appearance  on CT. 4. Probable degenerative uptake at the bilateral feet, shoulders, and midthoracic spine. Electronically Signed   By: KDonavan FoilM.D.   On: 12/09/2021 15:23   CT CHEST ABDOMEN PELVIS WO CONTRAST  Result Date: 12/08/2021 CLINICAL DATA:  Metastatic prostate cancer, chemotherapy and radiation complete * Tracking Code: BO * EXAM: CT CHEST, ABDOMEN AND PELVIS WITHOUT CONTRAST TECHNIQUE: Multidetector CT imaging of the chest, abdomen and pelvis was performed following the standard protocol without IV contrast. RADIATION DOSE REDUCTION: This exam was performed according to the departmental dose-optimization program which includes automated exposure control, adjustment of the mA and/or kV according to patient size and/or use of iterative reconstruction technique. COMPARISON:  CT abdomen pelvis, 06/18/2021, CT chest abdomen pelvis, 02/12/2021 FINDINGS: CT CHEST FINDINGS Cardiovascular: Left chest single lead pacer defibrillator. Aortic atherosclerosis. Normal heart size. Extensive three-vessel coronary artery calcifications and/or stents. Enlargement of the main pulmonary artery measuring up to 3.7 cm in caliber. Trace pericardial effusion. Mediastinum/Nodes: No enlarged mediastinal, hilar, or axillary lymph nodes. Thyroid gland, trachea, and esophagus demonstrate no significant findings. Lungs/Pleura: Bandlike scarring and or atelectasis of the bilateral lung bases. No pleural effusion or pneumothorax. Musculoskeletal: No chest wall abnormality. No acute osseous findings. CT ABDOMEN PELVIS FINDINGS Hepatobiliary: No solid liver abnormality is seen. Contracted gallbladder. No  gallstones, gallbladder wall thickening, or biliary dilatation. Pancreas: Unremarkable. No pancreatic ductal dilatation or surrounding inflammatory changes. Spleen: Normal in size without significant abnormality. Adrenals/Urinary Tract: Adrenal glands are unremarkable. Kidneys are normal, without renal calculi, solid lesion, or  hydronephrosis. Mild thickening of the urinary bladder wall. Stomach/Bowel: Stomach is within normal limits. Appendix appears normal. No evidence of bowel wall thickening, distention, or inflammatory changes. Vascular/Lymphatic: Aortic atherosclerosis. No enlarged abdominal or pelvic lymph nodes. Reproductive: Mild prostatomegaly. Other: No abdominal wall hernia or abnormality. No ascites. Musculoskeletal: No acute osseous findings. Unchanged subtly sclerotic osseous metastatic disease, for example of the L1 and L2 vertebral bodies (series 6, image 101, 96). Unchanged, focally severe disc degenerative disease at T6-T7 with sclerosis of the vertebral bodies (series 6, image 103). IMPRESSION: 1. Mild prostatomegaly without discrete mass appreciated by CT. 2. Unchanged sclerotic osseous metastatic disease, for example of the L1 and L2 vertebral bodies. 3. Unchanged, focally severe disc degenerative disease at T6-T7 with sclerosis of the vertebral bodies, most likely degenerative change although underlying osseous metastasis again not excluded. Continued attention on follow-up. 4. No noncontrast evidence of lymphadenopathy or soft tissue metastatic disease to the chest, abdomen, or pelvis. 5. Enlargement of the main pulmonary artery, as can be seen in pulmonary hypertension. 6. Coronary artery disease. Aortic Atherosclerosis (ICD10-I70.0). Electronically Signed   By: Delanna Ahmadi M.D.   On: 12/08/2021 20:56

## 2021-12-10 NOTE — Assessment & Plan Note (Signed)
Treatment plan as listed above. 

## 2021-12-10 NOTE — Assessment & Plan Note (Signed)
Asymptomatic. Consider palliative RT if he develops symptoms.  

## 2021-12-10 NOTE — Assessment & Plan Note (Addendum)
#  Metastatic prostate cancer to bone, castration sensitive Eligard 45 mg Q6 months with his PCP at [facility] given in November 2022. Recommend him to receive Eligard 45 mg every 6 months. Labs reviewed and discussed with patient.   Continue Darolutamide 600 mg twice daily. PSA nadir 0.01. he is developing castration resistance disease. - possible new bone lesion Continue current regimen and close monitor.   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 Consider possible rucaparib/olaparib in subsequent lines of treatment

## 2021-12-10 NOTE — Progress Notes (Signed)
Pt here with brother today. No new concerns voiced.

## 2021-12-13 ENCOUNTER — Telehealth: Payer: Self-pay

## 2021-12-13 NOTE — Telephone Encounter (Signed)
Office visit note from 10/6 faxed to [place].

## 2021-12-28 ENCOUNTER — Ambulatory Visit (INDEPENDENT_AMBULATORY_CARE_PROVIDER_SITE_OTHER): Payer: Medicare (Managed Care) | Admitting: Vascular Surgery

## 2021-12-28 ENCOUNTER — Encounter (INDEPENDENT_AMBULATORY_CARE_PROVIDER_SITE_OTHER): Payer: Medicare (Managed Care)

## 2021-12-31 ENCOUNTER — Other Ambulatory Visit (INDEPENDENT_AMBULATORY_CARE_PROVIDER_SITE_OTHER): Payer: Self-pay | Admitting: Nurse Practitioner

## 2021-12-31 DIAGNOSIS — I723 Aneurysm of iliac artery: Secondary | ICD-10-CM

## 2022-01-04 ENCOUNTER — Ambulatory Visit (INDEPENDENT_AMBULATORY_CARE_PROVIDER_SITE_OTHER): Payer: Medicare (Managed Care) | Admitting: Vascular Surgery

## 2022-01-04 ENCOUNTER — Ambulatory Visit (INDEPENDENT_AMBULATORY_CARE_PROVIDER_SITE_OTHER): Payer: Medicare (Managed Care)

## 2022-01-04 ENCOUNTER — Encounter (INDEPENDENT_AMBULATORY_CARE_PROVIDER_SITE_OTHER): Payer: Self-pay | Admitting: Vascular Surgery

## 2022-01-04 VITALS — BP 155/91 | HR 77 | Resp 16 | Wt 188.4 lb

## 2022-01-04 DIAGNOSIS — E119 Type 2 diabetes mellitus without complications: Secondary | ICD-10-CM

## 2022-01-04 DIAGNOSIS — I723 Aneurysm of iliac artery: Secondary | ICD-10-CM

## 2022-01-04 DIAGNOSIS — I10 Essential (primary) hypertension: Secondary | ICD-10-CM

## 2022-01-04 DIAGNOSIS — E785 Hyperlipidemia, unspecified: Secondary | ICD-10-CM

## 2022-01-04 NOTE — Assessment & Plan Note (Signed)
Still below our threshold for consideration of prophylactic repair.  Continue to follow annually with duplex.  He has stopped smoking and we discussed the importance of blood pressure control to avoid progression of aneurysmal disease.

## 2022-01-04 NOTE — Progress Notes (Signed)
MRN : [id number]  Matthew Mcgrath is a 76 y.o. ([birth year]) male who presents with chief complaint of  Chief Complaint  Patient presents with   Follow-up    Ultrasound follow up  .  History of Present Illness: Patient returns today in follow up of his iliac artery aneurysms.  He is doing well.  He has no specific complaints today.  He denies any aneurysm related symptoms. Specifically, the patient denies new back or abdominal pain, or signs of peripheral embolization.  His duplex today shows slight increase in size in his aneurysm in the right common iliac artery now measuring 2.3 cm in maximal diameter with a left common iliac artery measuring 1.7 cm in maximal diameter.  His aorta itself is less than 3 cm and not aneurysmal on duplex today.  Current Outpatient Medications  Medication Sig Dispense Refill   acetaminophen (TYLENOL) 650 MG CR tablet SMARTSIG:2 Tablet(s) By Mouth Twice Daily     ASPIRIN LOW DOSE 81 MG tablet Take 81 mg by mouth daily.     carvedilol (COREG) 3.125 MG tablet Take 3.125 mg by mouth 2 (two) times daily.     cholecalciferol (VITAMIN D3) 25 MCG (1000 UNIT) tablet Take 1,000 Units by mouth daily.     darolutamide (NUBEQA) 300 MG tablet Take 600 mg by mouth 2 (two) times daily with a meal.     glucose blood (TRUE METRIX BLOOD GLUCOSE TEST) test strip 1 each by Other route as needed for other. Use as instructed     GLUMETZA 1000 MG 24 hr tablet Take 1,000 mg by mouth daily.     JARDIANCE 10 MG TABS tablet Take 10 mg by mouth daily.     LEUPROLIDE ACETATE, 6 MONTH, 45 MG injection Inject into the skin.     LIDODERM 5 % Apply 1 patch to skin every morning as needed for L lower back pain. Remove after 12 hours. Call for refills     lisinopril (ZESTRIL) 2.5 MG tablet Take 2.5 mg by mouth daily.     magnesium oxide (MAG-OX) 400 (240 Mg) MG tablet Take 1 tablet by mouth 2 (two) times daily.     metFORMIN (GLUCOPHAGE-XR) 500 MG 24 hr tablet Take 500 mg by mouth every  evening.     Multiple Vitamin (ONE-DAILY MULTI-VITAMIN) TABS Take 1 tablet by mouth daily.     Omega-3 Fatty Acids (OMEGA III EPA+DHA) 1000 MG CAPS Take 1 capsule by mouth 2 (two) times daily.     spironolactone (ALDACTONE) 25 MG tablet Take 0.5 tablets (12.5 mg total) by mouth daily.     tamsulosin (FLOMAX) 0.4 MG CAPS capsule Take 1 capsule (0.4 mg total) by mouth daily. 30 capsule 2   TRUEplus Lancets 28G MISC by Does not apply route.     magnesium oxide (MAG-OX) 400 MG tablet Take 1 tablet by mouth 2 (two) times daily. (Patient not taking: Reported on 12/10/2021)     No current facility-administered medications for this visit.    Past Medical History:  Diagnosis Date   Cardiomyopathy ([place])    Congestive heart failure (H[facility])    Diabetes mellitus type 2, controlled ([place])    Elevated lipids    Family history of breast cancer    Family history of colon cancer    HTN (hypertension)     Past Surgical History:  Procedure Laterality Date   CARDIAC CATHETERIZATION N/A 04/14/2015   Procedure: Left Heart Cath and Coronary Angiography;  Surgeon: [name], MD;  Location: [facility];  Service: Cardiovascular;  Laterality: N/A;   CATARACT EXTRACTION W/ INTRAOCULAR LENS  IMPLANT, BILATERAL Left    COLONOSCOPY WITH PROPOFOL N/A 04/08/2020   Procedure: COLONOSCOPY WITH PROPOFOL;  Surgeon: Toledo, Teodoro K, MD;  Location: [facility] ENDOS[facility];  Service: Gastroenterology;  Laterality: N/A;   CYSTOSCOPY W/ RETROGRADES Bilateral 07/02/2021   Procedure: CYSTOSCOPY WITH RETROGRADE PYELOGRAM;  Surgeon: Sninsky, Brian C, MD;  Location: [facility];  Service: Urology;  Laterality: Bilateral;   HERNIA REPAIR     IMPLANTABLE CARDIOVERTER DEFIBRILLATOR (ICD) GENERATOR CHANGE Left 07/03/2015   Procedure: ICD IMPLANT single chamber;  Surgeon: Kevin L [name], MD;  Location: [facility] ORS;  Service: Cardiovascular;  Laterality: Left;   TRANSURETHRAL RESECTION OF BLADDER TUMOR N/A 07/02/2021   Procedure:  TRANSURETHRAL RESECTION OF BLADDER TUMOR (TURBT);  Surgeon: Sninsky, Brian C, MD;  Location: [facility] OR[facility];  Service: Urology;  Laterality: N/A;     Social History   Tobacco Use   Smoking status: Former    Packs/day: 0.25    Years: 30.00    Total pack years: 7.50    Types: Cigarettes    Quit date: 02/11/2020    Years since quitting: 1.8   Smokeless tobacco: Never  Vaping Use   Vaping Use: Never used  Substance Use Topics   Alcohol use: No   Drug use: Not Currently    Frequency: 2.0 times per week    Types: Marijuana    Comment: past use      Family History  Problem Relation Age of Onset   Heart failure Mother    Heart disease Mother    Alcoholism Father    Hypertension Sister    Diabetes Sister    Hypertension Brother    Diabetes Brother    Colon cancer Brother        vs prostate   Multiple myeloma Brother    Breast cancer Cousin      No Known Allergies   REVIEW OF SYSTEMS (Negative unless checked)  Constitutional: []Weight loss  []Fever  []Chills Cardiac: []Chest pain   []Chest pressure   []Palpitations   []Shortness of breath when laying flat   []Shortness of breath at rest   []Shortness of breath with exertion. Vascular:  []Pain in legs with walking   []Pain in legs at rest   []Pain in legs when laying flat   []Claudication   []Pain in feet when walking  []Pain in feet at rest  []Pain in feet when laying flat   []History of DVT   []Phlebitis   []Swelling in legs   []Varicose veins   []Non-healing ulcers Pulmonary:   []Uses home oxygen   []Productive cough   []Hemoptysis   []Wheeze  []COPD   []Asthma Neurologic:  []Dizziness  []Blackouts   []Seizures   []History of stroke   []History of TIA  []Aphasia   []Temporary blindness   []Dysphagia   []Weakness or numbness in arms   []Weakness or numbness in legs Musculoskeletal:  [x]Arthritis   []Joint swelling   []Joint pain   []Low back pain Hematologic:  []Easy bruising  []Easy bleeding   []Hypercoagulable state   []Anemic    Gastrointestinal:  []Blood in stool   []Vomiting blood  []Gastroesophageal reflux/heartburn   []Abdominal pain Genitourinary:  []Chronic kidney disease   []Difficult urination  []Frequent urination  []Burning with urination   []Hematuria Skin:  []Rashes   []Ulcers   []Wounds Psychological:  []History of anxiety   [] History of   major depression.  Physical Examination  BP (!) 155/91 (BP Location: Left Arm)   Pulse 77   Resp 16   Wt 188 lb 6.4 oz (85.5 kg)   BMI 30.41 kg/m  Gen:  WD/WN, NAD Head: [place]/AT, No temporalis wasting. Ear/Nose/Throat: Hearing grossly intact, nares w/o erythema or drainage Eyes: Conjunctiva clear. Sclera non-icteric Neck: Supple.  Trachea midline Pulmonary:  Good air movement, no use of accessory muscles.  Cardiac: RRR, no JVD Vascular:  Vessel Right Left  Radial Palpable Palpable                                   Gastrointestinal: soft, non-tender/non-distended. No guarding/reflex.  Musculoskeletal: M/S 5/5 throughout.  No deformity or atrophy.  Walks with a walker.  Mild lower extremity edema. Neurologic: Sensation grossly intact in extremities.  Symmetrical.  Speech is fluent.  Psychiatric: Judgment intact, Mood & affect appropriate for pt's clinical situation. Dermatologic: No rashes or ulcers noted.  No cellulitis or open wounds.      Labs Recent Results (from the past 2160 hour(s))  Urinalysis, Complete     Status: Abnormal   Collection Time: 10/07/21  1:24 PM  Result Value Ref Range   Specific Gravity, UA 1.010 1.005 - 1.030   pH, UA 5.0 5.0 - 7.5   Color, UA Yellow Yellow   Appearance Ur Clear Clear   Leukocytes,UA Negative Negative   Protein,UA Negative Negative/Trace   Glucose, UA 3+ (A) Negative   Ketones, UA Trace (A) Negative   RBC, UA Negative Negative   Bilirubin, UA Negative Negative   Urobilinogen, Ur 0.2 0.2 - 1.0 mg/dL   Nitrite, UA Negative Negative   Microscopic Examination See below:   Microscopic Examination      Status: None   Collection Time: 10/07/21  1:24 PM   Urine  Result Value Ref Range   WBC, UA 0-5 0 - 5 /hpf   RBC, Urine 0-2 0 - 2 /hpf   Epithelial Cells (non renal) 0-10 0 - 10 /hpf   Bacteria, UA None seen None seen/Few  Testosterone     Status: Abnormal   Collection Time: 12/07/21  2:49 PM  Result Value Ref Range   Testosterone <3 (L) 264 - 916 ng/dL    Comment: (NOTE) Adult male reference interval is based on a population of healthy nonobese males (BMI <30) between 48 and 25 years old. [place], [place] [phone]. PMID: [id number]. Performed At: [facility] [place], [place] [id number] Rush Farmer MD VP:[id number]   PSA     Status: None   Collection Time: 12/07/21  2:49 PM  Result Value Ref Range   Prostatic Specific Antigen 0.45 0.00 - 4.00 ng/mL    Comment: (NOTE) While PSA levels of <=4.0 ng/ml are reported as reference range, some men with levels below 4.0 ng/ml can have prostate cancer and many men with PSA above 4.0 ng/ml do not have prostate cancer.  Other tests such as free PSA, age specific reference ranges, PSA velocity and PSA doubling time may be helpful especially in men less than 23 years old. Performed at [name], [place] [address], [place], [place] [address]   Comprehensive metabolic panel     Status: Abnormal   Collection Time: 12/07/21  2:49 PM  Result Value Ref Range   Sodium 134 (L) 135 - 145 mmol/L   Potassium 3.9 3.5 - 5.1 mmol/L  Chloride 103 98 - 111 mmol/L   CO2 24 22 - 32 mmol/L   Glucose, Bld 145 (H) 70 - 99 mg/dL    Comment: Glucose reference range applies only to samples taken after fasting for at least 8 hours.   BUN 16 8 - 23 mg/dL   Creatinine, Ser 0.70 0.61 - 1.24 mg/dL   Calcium 9.0 8.9 - 10.3 mg/dL   Total Protein 7.4 6.5 - 8.1 g/dL   Albumin 3.8 3.5 - 5.0 g/dL   AST 20 15 - 41 U/L   ALT 14 0 - 44 U/L   Alkaline Phosphatase 58 38 - 126 U/L   Total Bilirubin 0.5 0.3 - 1.2 mg/dL    GFR, Estimated >60 >60 mL/min    Comment: (NOTE) Calculated using the CKD-EPI Creatinine Equation (2021)    Anion gap 7 5 - 15    Comment: Performed at [facility], [address] [place], [place] [address]  CBC with Differential/Platelet     Status: Abnormal   Collection Time: 12/07/21  2:49 PM  Result Value Ref Range   WBC 3.5 (L) 4.0 - 10.5 K/uL   RBC 3.81 (L) 4.22 - 5.81 MIL/uL   Hemoglobin 11.7 (L) 13.0 - 17.0 g/dL   HCT 36.5 (L) 39.0 - 52.0 %   MCV 95.8 80.0 - 100.0 fL   MCH 30.7 26.0 - 34.0 pg   MCHC 32.1 30.0 - 36.0 g/dL   RDW 14.2 11.5 - 15.5 %   Platelets 204 150 - 400 K/uL   nRBC 0.0 0.0 - 0.2 %   Neutrophils Relative % 64 %   Neutro Abs 2.2 1.7 - 7.7 K/uL   Lymphocytes Relative 28 %   Lymphs Abs 1.0 0.7 - 4.0 K/uL   Monocytes Relative 7 %   Monocytes Absolute 0.3 0.1 - 1.0 K/uL   Eosinophils Relative 1 %   Eosinophils Absolute 0.1 0.0 - 0.5 K/uL   Basophils Relative 0 %   Basophils Absolute 0.0 0.0 - 0.1 K/uL   Immature Granulocytes 0 %   Abs Immature Granulocytes 0.00 0.00 - 0.07 K/uL    Comment: Performed at [facility], [address] [place], [place] [address]    Radiology NM Bone Scan Whole Body  Result Date: 12/09/2021 CLINICAL DATA:  Prostate cancer EXAM: NUCLEAR MEDICINE WHOLE BODY BONE SCAN TECHNIQUE: Whole body anterior and posterior images were obtained approximately 3 hours after intravenous injection of radiopharmaceutical. RADIOPHARMACEUTICALS:  21.89 mCi Technetium-57mMDP IV COMPARISON:  CT 12/07/2021, bone scan 03/11/2021, 04/27/2020 FINDINGS: Physiologic renal and bladder activity with probable small urine contamination inferior to the pelvis. New focus of activity involving the left inferior sternum. New focus of activity in the region of left first rib end/sternal clavicular junction. Faint activity at the lumbar spine in the region of L2. Faint activity at the midthoracic spine, likely corresponds to degenerative changes. New  focus of activity involving the right sixth rib on oblique view. Similar lower neck activity on anterior views possibly related to thyroid or thyroid cartilage. Mild asymmetrical activity in the right femoral shaft. Likely degenerative uptake at the feet and shoulders. IMPRESSION: 1. There is faint activity in the upper lumbar region, approximate L2 level, likely corresponds to faint sclerosis/metastasis on CT 2. New focus of activity at the left inferior sternum and right sixth rib anteriorly, possibly metastatic but no definitive corresponding lesion on CT. 3. New prominent activity at the left first rib end/sternoclavicular junction potentially degenerative, given appearance on  CT. 4. Probable degenerative uptake at the bilateral feet, shoulders, and midthoracic spine. Electronically Signed   By: Kim  Fujinaga M.D.   On: 12/09/2021 15:23   CT CHEST ABDOMEN PELVIS WO CONTRAST  Result Date: 12/08/2021 CLINICAL DATA:  Metastatic prostate cancer, chemotherapy and radiation complete * Tracking Code: BO * EXAM: CT CHEST, ABDOMEN AND PELVIS WITHOUT CONTRAST TECHNIQUE: Multidetector CT imaging of the chest, abdomen and pelvis was performed following the standard protocol without IV contrast. RADIATION DOSE REDUCTION: This exam was performed according to the departmental dose-optimization program which includes automated exposure control, adjustment of the mA and/or kV according to patient size and/or use of iterative reconstruction technique. COMPARISON:  CT abdomen pelvis, 06/18/2021, CT chest abdomen pelvis, 02/12/2021 FINDINGS: CT CHEST FINDINGS Cardiovascular: Left chest single lead pacer defibrillator. Aortic atherosclerosis. Normal heart size. Extensive three-vessel coronary artery calcifications and/or stents. Enlargement of the main pulmonary artery measuring up to 3.7 cm in caliber. Trace pericardial effusion. Mediastinum/Nodes: No enlarged mediastinal, hilar, or axillary lymph nodes. Thyroid gland, trachea,  and esophagus demonstrate no significant findings. Lungs/Pleura: Bandlike scarring and or atelectasis of the bilateral lung bases. No pleural effusion or pneumothorax. Musculoskeletal: No chest wall abnormality. No acute osseous findings. CT ABDOMEN PELVIS FINDINGS Hepatobiliary: No solid liver abnormality is seen. Contracted gallbladder. No gallstones, gallbladder wall thickening, or biliary dilatation. Pancreas: Unremarkable. No pancreatic ductal dilatation or surrounding inflammatory changes. Spleen: Normal in size without significant abnormality. Adrenals/Urinary Tract: Adrenal glands are unremarkable. Kidneys are normal, without renal calculi, solid lesion, or hydronephrosis. Mild thickening of the urinary bladder wall. Stomach/Bowel: Stomach is within normal limits. Appendix appears normal. No evidence of bowel wall thickening, distention, or inflammatory changes. Vascular/Lymphatic: Aortic atherosclerosis. No enlarged abdominal or pelvic lymph nodes. Reproductive: Mild prostatomegaly. Other: No abdominal wall hernia or abnormality. No ascites. Musculoskeletal: No acute osseous findings. Unchanged subtly sclerotic osseous metastatic disease, for example of the L1 and L2 vertebral bodies (series 6, image 101, 96). Unchanged, focally severe disc degenerative disease at T6-T7 with sclerosis of the vertebral bodies (series 6, image 103). IMPRESSION: 1. Mild prostatomegaly without discrete mass appreciated by CT. 2. Unchanged sclerotic osseous metastatic disease, for example of the L1 and L2 vertebral bodies. 3. Unchanged, focally severe disc degenerative disease at T6-T7 with sclerosis of the vertebral bodies, most likely degenerative change although underlying osseous metastasis again not excluded. Continued attention on follow-up. 4. No noncontrast evidence of lymphadenopathy or soft tissue metastatic disease to the chest, abdomen, or pelvis. 5. Enlargement of the main pulmonary artery, as can be seen in  pulmonary hypertension. 6. Coronary artery disease. Aortic Atherosclerosis (ICD10-I70.0). Electronically Signed   By: Alex D Bibbey M.D.   On: 12/08/2021 20:56    Assessment/Plan Diabetes blood glucose control important in reducing the progression of atherosclerotic disease. Also, involved in wound healing. On appropriate medications.     Essential (primary) hypertension blood pressure control important in reducing the progression of atherosclerotic disease and aneurysmal degeneration. On appropriate oral medications.  Iliac artery aneurysm (HCC) Still below our threshold for consideration of prophylactic repair.  Continue to follow annually with duplex.  He has stopped smoking and we discussed the importance of blood pressure control to avoid progression of aneurysmal disease.     , MD  01/04/2022 1:47 PM    This note was created with Dragon medical transcription system.  Any errors from dictation are purely unintentional  

## 2022-01-13 ENCOUNTER — Other Ambulatory Visit: Payer: Medicare (Managed Care) | Admitting: Urology

## 2022-02-10 ENCOUNTER — Other Ambulatory Visit: Payer: Medicare (Managed Care) | Admitting: Urology

## 2022-02-23 ENCOUNTER — Ambulatory Visit (INDEPENDENT_AMBULATORY_CARE_PROVIDER_SITE_OTHER): Payer: Medicare (Managed Care) | Admitting: Urology

## 2022-02-23 VITALS — BP 127/79 | HR 75

## 2022-02-23 DIAGNOSIS — D494 Neoplasm of unspecified behavior of bladder: Secondary | ICD-10-CM

## 2022-02-23 DIAGNOSIS — R310 Gross hematuria: Secondary | ICD-10-CM

## 2022-02-23 DIAGNOSIS — C679 Malignant neoplasm of bladder, unspecified: Secondary | ICD-10-CM | POA: Diagnosis not present

## 2022-02-23 DIAGNOSIS — Z8546 Personal history of malignant neoplasm of prostate: Secondary | ICD-10-CM | POA: Diagnosis not present

## 2022-02-23 LAB — URINALYSIS, COMPLETE
Bilirubin, UA: NEGATIVE
Ketones, UA: NEGATIVE
Leukocytes,UA: NEGATIVE
Nitrite, UA: NEGATIVE
Protein,UA: NEGATIVE
RBC, UA: NEGATIVE
Specific Gravity, UA: 1.02 (ref 1.005–1.030)
Urobilinogen, Ur: 0.2 mg/dL (ref 0.2–1.0)
pH, UA: 5 (ref 5.0–7.5)

## 2022-02-23 LAB — MICROSCOPIC EXAMINATION: Bacteria, UA: NONE SEEN

## 2022-02-23 MED ORDER — SULFAMETHOXAZOLE-TRIMETHOPRIM 800-160 MG PO TABS
1.0000 | ORAL_TABLET | Freq: Once | ORAL | Status: AC
  Administered 2022-02-23: 1 via ORAL

## 2022-02-23 MED ORDER — LIDOCAINE HCL URETHRAL/MUCOSAL 2 % EX GEL
1.0000 | Freq: Once | CUTANEOUS | Status: AC
  Administered 2022-02-23: 1 via URETHRAL

## 2022-02-23 NOTE — Progress Notes (Signed)
Bladder cancer surveillance note  INDICATION History of bladder cancer  UROLOGIC HISTORY Matthew Mcgrath is a 76 y.o. male comorbid male with history of metastatic prostate cancer treated with radiation, ADT, and managed by oncology, found to have gross hematuria and was diagnosed with a 2.5 cm bladder tumor.  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  With his comorbidities and frailty, second look TURBT and other intravesical treatments deferred.  Initial Diagnosis of Bladder  Year: 07/02/2021 Pathology: HG T1 urothelial cell carcinoma  Recurrent Bladder Cancer Diagnosis None  Treatments for Bladder Cancer 07/02/2021: TURBT  AUA Risk Category High  Cystoscopy Procedure Note:  Bactrim given for prophylaxis.   After informed consent and discussion of the procedure and its risks, Matthew Mcgrath was positioned and prepped in the standard fashion. Cystoscopy was performed with the a flexible cystoscope.  Prostate was moderate in size.  The urethra, bladder neck and entire bladder was visualized in a standard fashion.  There was some scarring at the left lateral wall/dome at prior resection site, and a very small amount of some subtle erythema adjacent.  Directed cytology was sent.  The ureteral orifices were visualized in their normal location and orientation.  No abnormalities on retroflexion.  -Follow-up cytology, if suspicious or positive we will schedule bladder biopsy and fulguration, if negative/atypical RTC 4 months cystoscopy -Continue follow-up with oncology for metastatic prostate cancer   Nickolas Madrid, MD 02/23/2022

## 2022-02-23 NOTE — Addendum Note (Signed)
Addended by: Donalee Citrin on: 02/23/2022 10:47 AM   Modules accepted: Orders

## 2022-02-25 LAB — CYTOLOGY - NON PAP

## 2022-03-03 ENCOUNTER — Telehealth: Payer: Self-pay

## 2022-03-03 NOTE — Telephone Encounter (Signed)
-----   Message from Billey Co, MD sent at 03/03/2022  8:13 AM EST ----- No cancer cells on urine sample, keep follow up as scheduled  Nickolas Madrid, MD 03/03/2022

## 2022-03-03 NOTE — Telephone Encounter (Signed)
Called pt's sister per DPR, no answer. Left detailed message per DPR.

## 2022-03-11 ENCOUNTER — Inpatient Hospital Stay: Payer: Medicare (Managed Care) | Attending: Oncology

## 2022-03-11 DIAGNOSIS — G8929 Other chronic pain: Secondary | ICD-10-CM | POA: Diagnosis not present

## 2022-03-11 DIAGNOSIS — E119 Type 2 diabetes mellitus without complications: Secondary | ICD-10-CM | POA: Insufficient documentation

## 2022-03-11 DIAGNOSIS — Z803 Family history of malignant neoplasm of breast: Secondary | ICD-10-CM | POA: Diagnosis not present

## 2022-03-11 DIAGNOSIS — Z1509 Genetic susceptibility to other malignant neoplasm: Secondary | ICD-10-CM | POA: Diagnosis not present

## 2022-03-11 DIAGNOSIS — C7951 Secondary malignant neoplasm of bone: Secondary | ICD-10-CM | POA: Diagnosis not present

## 2022-03-11 DIAGNOSIS — D649 Anemia, unspecified: Secondary | ICD-10-CM | POA: Insufficient documentation

## 2022-03-11 DIAGNOSIS — C61 Malignant neoplasm of prostate: Secondary | ICD-10-CM | POA: Insufficient documentation

## 2022-03-11 DIAGNOSIS — Z87891 Personal history of nicotine dependence: Secondary | ICD-10-CM | POA: Insufficient documentation

## 2022-03-11 DIAGNOSIS — Z807 Family history of other malignant neoplasms of lymphoid, hematopoietic and related tissues: Secondary | ICD-10-CM | POA: Diagnosis not present

## 2022-03-11 DIAGNOSIS — I509 Heart failure, unspecified: Secondary | ICD-10-CM | POA: Insufficient documentation

## 2022-03-11 DIAGNOSIS — Z800 Family history of malignant neoplasm of digestive organs: Secondary | ICD-10-CM | POA: Diagnosis not present

## 2022-03-11 DIAGNOSIS — I110 Hypertensive heart disease with heart failure: Secondary | ICD-10-CM | POA: Diagnosis not present

## 2022-03-11 LAB — COMPREHENSIVE METABOLIC PANEL
ALT: 12 U/L (ref 0–44)
AST: 16 U/L (ref 15–41)
Albumin: 3.9 g/dL (ref 3.5–5.0)
Alkaline Phosphatase: 66 U/L (ref 38–126)
Anion gap: 11 (ref 5–15)
BUN: 22 mg/dL (ref 8–23)
CO2: 24 mmol/L (ref 22–32)
Calcium: 9.3 mg/dL (ref 8.9–10.3)
Chloride: 101 mmol/L (ref 98–111)
Creatinine, Ser: 0.72 mg/dL (ref 0.61–1.24)
GFR, Estimated: >60 mL/min (ref >60)
Glucose, Bld: 129 mg/dL — ABNORMAL HIGH (ref 70–99)
Potassium: 4 mmol/L (ref 3.5–5.1)
Sodium: 136 mmol/L (ref 135–145)
Total Bilirubin: 0.6 mg/dL (ref 0.3–1.2)
Total Protein: 7.5 g/dL (ref 6.5–8.1)

## 2022-03-11 LAB — CBC WITH DIFFERENTIAL/PLATELET
Abs Immature Granulocytes: 0.01 10*3/uL (ref 0.00–0.07)
Basophils Absolute: 0 10*3/uL (ref 0.0–0.1)
Basophils Relative: 1 %
Eosinophils Absolute: 0 10*3/uL (ref 0.0–0.5)
Eosinophils Relative: 1 %
HCT: 35 % — ABNORMAL LOW (ref 39.0–52.0)
Hemoglobin: 11.5 g/dL — ABNORMAL LOW (ref 13.0–17.0)
Immature Granulocytes: 0 %
Lymphocytes Relative: 26 %
Lymphs Abs: 1 10*3/uL (ref 0.7–4.0)
MCH: 30.7 pg (ref 26.0–34.0)
MCHC: 32.9 g/dL (ref 30.0–36.0)
MCV: 93.3 fL (ref 80.0–100.0)
Monocytes Absolute: 0.3 10*3/uL (ref 0.1–1.0)
Monocytes Relative: 9 %
Neutro Abs: 2.5 10*3/uL (ref 1.7–7.7)
Neutrophils Relative %: 63 %
Platelets: 211 10*3/uL (ref 150–400)
RBC: 3.75 MIL/uL — ABNORMAL LOW (ref 4.22–5.81)
RDW: 13.4 % (ref 11.5–15.5)
WBC: 3.9 10*3/uL — ABNORMAL LOW (ref 4.0–10.5)
nRBC: 0 % (ref 0.0–0.2)

## 2022-03-11 LAB — PSA: Prostatic Specific Antigen: 1.24 ng/mL (ref 0.00–4.00)

## 2022-03-14 ENCOUNTER — Inpatient Hospital Stay (HOSPITAL_BASED_OUTPATIENT_CLINIC_OR_DEPARTMENT_OTHER): Payer: Medicare (Managed Care) | Admitting: Oncology

## 2022-03-14 ENCOUNTER — Encounter: Payer: Self-pay | Admitting: Oncology

## 2022-03-14 VITALS — BP 142/75 | HR 71 | Temp 96.0°F | Wt 192.0 lb

## 2022-03-14 DIAGNOSIS — Z5111 Encounter for antineoplastic chemotherapy: Secondary | ICD-10-CM

## 2022-03-14 DIAGNOSIS — C7951 Secondary malignant neoplasm of bone: Secondary | ICD-10-CM

## 2022-03-14 DIAGNOSIS — C61 Malignant neoplasm of prostate: Secondary | ICD-10-CM

## 2022-03-14 DIAGNOSIS — D649 Anemia, unspecified: Secondary | ICD-10-CM | POA: Diagnosis not present

## 2022-03-14 NOTE — Assessment & Plan Note (Signed)
Treatment plan as listed above. 

## 2022-03-14 NOTE — Progress Notes (Signed)
Hematology/Oncology Progress note [phone] [phone]      Patient Care Team: [facility], [facility] as PCP - General [name], MD as Consulting Physician (Cardiology) Alisa Graff, FNP as Nurse Practitioner (Family Medicine) [name], MD as Consulting Physician (Hematology and Oncology)  ASSESSMENT & PLAN:   Cancer Staging  Prostate cancer [facility]) Staging form: Prostate, AJCC 8th Edition - Clinical stage from 05/11/2020: Stage IVB (cTX, cN0, cM1, PSA: 30) - Signed by [name], MD on 05/11/2020   Prostate cancer [facility]) #Metastatic prostate cancer to bone, castration sensitive Eligard 45 mg Q6 months with his PCP at [facility] given in November 2022. Recommend him to receive Eligard 45 mg every 6 months. Labs reviewed and discussed with patient.   Continue Darolutamide 600 mg twice daily. PSA nadir 0.01. PSA is rising 1.45 he is developing castration resistance disease. - possible new bone lesion on previous bone scan.  Check PMSA prior to next visit in 2 months.  Continue current regimen for now and close monitor.   #NGS showed BRCA 2 S1404, BRIP1 R173C 3X, FOXO1 1S382f. 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 Consider possible rucaparib/olaparib in subsequent lines of treatment  Metastasis to bone [place] Asymptomatic. Consider palliative RT if he develops symptoms.   Encounter for antineoplastic chemotherapy Treatment plan as listed above  Anemia Hemoglobin is stable  Monitor.  Orders Placed This Encounter  Procedures   NM PET (PSMA) SKULL TO MID THIGH    Standing Status:   Future    Standing Expiration Date:   03/15/2023    Order Specific Question:   If indicated for the ordered procedure, I authorize the administration of a radiopharmaceutical per Radiology protocol    Answer:   Yes    Order Specific Question:   Preferred imaging location?    Answer:   [facility]   CBC with  Differential/Platelet    Standing Status:   Future    Standing Expiration Date:   03/14/2023   Comprehensive metabolic panel    Standing Status:   Future    Standing Expiration Date:   03/14/2023   PSA    Standing Status:   Future    Standing Expiration Date:   03/15/2023   Follow up 3 months All questions were answered. The patient knows to call the clinic with any problems, questions or concerns.  ZEarlie Server MD, PhD [facility] Hematology Oncology 03/14/2022   CHIEF COMPLAINTS/REASON FOR VISIT:  metastatic prostate cancer  HISTORY OF PRESENTING ILLNESS:   Matthew Mcgrath a  77y.o.  male with PMH listed below was seen in consultation at the request of  [facility] [place] for evaluation of metastatic prostate cancer. Oncology History  Prostate cancer ([place]  04/16/2020 Procedure   Firmagon 240 mg loading dose   04/21/2020 Imaging   CT abdomen pelvis showed a sclerotic lesion in the lumbar spine concerning for probable metastatic disease.  No definitive extra skeletal metastatic disease noted elsewhere in the abdomen or pelvis.  Mild circumferential bladder wall thickening.  Aortic atherosclerosis.   05/11/2020 Initial Diagnosis   Prostate cancer   04/01/2020, prostate biopsy showed  All cores were positive for high risk prostate cancer, primarily Gleason score 5+4 = 9, with max core involvement of 100% and perineural invasion present.   05/11/2020 Cancer Staging   Staging form: Prostate, AJCC 8th Edition - Clinical stage from 05/11/2020: Stage IVB (cTX, cN0, cM1,  PSA: 30) - Signed by [name], MD on 05/11/2020 Prostate specific antigen (PSA) range: 20 or greater   10/19/2020 -  Radiation Therapy   finished definitive RT. to prostate cancer and pelvic lymph node treatments.   02/13/2021 Imaging   CT chest abdomen pelvis without contrast showed slight interval increase in sclerosis of previously osseous metastasis involving lumbar spine, most consistent with posttreatment sclerosis.   Focally severe disc degeneration and sclerosis versus sclerotic osseous metastatic of T6 and 7.  Attention on follow-up.  No noncontrast evidence of lymphadenopathy or soft tissue metastatic disease in chest abdomen pelvis.  Prostamegaly.  Enlargement of main pulmonary artery.  Coronary artery disease.   02/21/2021 Genetic Testing   [employer] Multi-Cancer+RNA cancer panel found a single, pathogenic variant in BRCA2 called [id number]. The remainder of testing was negative/normal.      03/11/2021 Imaging   , bone scan showed improved/decreased uptake in T6 and L2   04/27/2021 Imaging   Bone scan bone scan showed foci of abnormal uptake noted in the mid thoracic and upper lumbar spine concerning for metastatic disease   12/07/2021 Imaging   CT chest abdomen pelvis wo contrast 1. Mild prostatomegaly without discrete mass appreciated by CT. 2. Unchanged sclerotic osseous metastatic disease, for example of the L1 and L2 vertebral bodies. 3. Unchanged, focally severe disc degenerative disease at T6-T7 with sclerosis of the vertebral bodies, most likely degenerative change although underlying osseous metastasis again not excluded. Continued attention on follow-up. 4. No noncontrast evidence of lymphadenopathy or soft tissue metastatic disease to the chest, abdomen, or pelvis. 5. Enlargement of the main pulmonary artery, as can be seen in pulmonary hypertension. 6. Coronary artery disease.   12/07/2021 Imaging   Bone scan showed 1. There is faint activity in the upper lumbar region, approximate L2 level, likely corresponds to faint sclerosis/metastasis on CT 2. New focus of activity at the left inferior sternum and right sixth rib anteriorly, possibly metastatic but no definitive corresponding lesion on CT. 3. New prominent activity at the left first rib end/sternoclavicular junction potentially degenerative, given appearance on CT. 4. Probable degenerative uptake at the bilateral feet, shoulders,and  midthoracic spine   #06/11/2020 patient went to [facility] for second opinion was seen by [name] who agrees with continuing ADT with switch to Eligard 45 mg.  He is due to proceed with the treatments today.  Communicated with pace program [name], patient will receive treatment at [place]. [name] also recommends to start darolutamide and authorization process has been initiated at [facility]. #07/06/2020 started darolutamide 600 mg twice daily # definitive treatment for prostate and pelvic node- Discussed with [facility] [name] and I appreciate his expert opinion of darolutamide concurrent use with RT and ADT  INTERVAL HISTORY OLAOLUWA GRIEDER is a 76 y.o. male who has above history reviewed by me today presents for follow up visit for management of metastatic prostate cancer Patient was accompanied by his brother.  He is currently on Darolutamide 600 mg twice daily denies any new complaints.  He denies any new bone pain.  Chronic back pain unchanged.  Review of Systems  Constitutional:  Positive for fatigue. Negative for appetite change, chills, fever and unexpected weight change.  HENT:   Negative for hearing loss and voice change.   Eyes:  Negative for eye problems and icterus.  Respiratory:  Negative for chest tightness, cough and shortness of breath.   Cardiovascular:  Negative for chest pain and leg swelling.  Gastrointestinal:  Negative for abdominal distention and abdominal  pain.  Endocrine: Negative for hot flashes.  Genitourinary:  Negative for difficulty urinating, dysuria and frequency.   Musculoskeletal:  Positive for back pain. Negative for arthralgias.  Skin:  Negative for itching and rash.  Neurological:  Negative for light-headedness and numbness.  Hematological:  Negative for adenopathy. Does not bruise/bleed easily.  Psychiatric/Behavioral:  Negative for confusion.     MEDICAL HISTORY:  Past Medical History:  Diagnosis Date   Cardiomyopathy ([place])    Congestive heart failure (HCC)     Diabetes mellitus type 2, controlled ([place])    Elevated lipids    Family history of breast cancer    Family history of colon cancer    HTN (hypertension)     SURGICAL HISTORY: Past Surgical History:  Procedure Laterality Date   CARDIAC CATHETERIZATION N/A 04/14/2015   Procedure: Left Heart Cath and Coronary Angiography;  Surgeon: [name], MD;  Location: [facility];  Service: Cardiovascular;  Laterality: N/A;   CATARACT EXTRACTION W/ INTRAOCULAR LENS  IMPLANT, BILATERAL Left    COLONOSCOPY WITH PROPOFOL N/A 04/08/2020   Procedure: COLONOSCOPY WITH PROPOFOL;  Surgeon: Toledo, Benay Pike, MD;  Location: [facility] ENDOSCOPY;  Service: Gastroenterology;  Laterality: N/A;   CYSTOSCOPY W/ RETROGRADES Bilateral 07/02/2021   Procedure: CYSTOSCOPY WITH RETROGRADE PYELOGRAM;  Surgeon: Billey Co, MD;  Location: [facility] ORS;  Service: Urology;  Laterality: Bilateral;   HERNIA REPAIR     IMPLANTABLE CARDIOVERTER DEFIBRILLATOR (ICD) GENERATOR CHANGE Left 07/03/2015   Procedure: ICD IMPLANT single chamber;  Surgeon: Marzetta Board, MD;  Location: [facility] ORS;  Service: Cardiovascular;  Laterality: Left;   TRANSURETHRAL RESECTION OF BLADDER TUMOR N/A 07/02/2021   Procedure: TRANSURETHRAL RESECTION OF BLADDER TUMOR (TURBT);  Surgeon: Billey Co, MD;  Location: [facility] ORS;  Service: Urology;  Laterality: N/A;    SOCIAL HISTORY: Social History   Socioeconomic History   Marital status: Single    Spouse name: Not on file   Number of children: Not on file   Years of education: Not on file   Highest education level: Not on file  Occupational History   Not on file  Tobacco Use   Smoking status: Former    Packs/day: 0.25    Years: 30.00    Total pack years: 7.50    Types: Cigarettes    Quit date: 02/11/2020    Years since quitting: 2.0   Smokeless tobacco: Never  Vaping Use   Vaping Use: Never used  Substance and Sexual Activity   Alcohol use: No   Drug use: Not Currently     Frequency: 2.0 times per week    Types: Marijuana    Comment: past use   Sexual activity: Not on file  Other Topics Concern   Not on file  Social History Narrative   Not on file   Social Determinants of Health   Financial Resource Strain: Not on file  Food Insecurity: Not on file  Transportation Needs: Not on file  Physical Activity: Not on file  Stress: Not on file  Social Connections: Not on file  Intimate Partner Violence: Not on file    FAMILY HISTORY: Family History  Problem Relation Age of Onset   Heart failure Mother    Heart disease Mother    Alcoholism Father    Hypertension Sister    Diabetes Sister    Hypertension Brother    Diabetes Brother    Colon cancer Brother        vs prostate   Multiple  myeloma Brother    Breast cancer Cousin     ALLERGIES:  has No Known Allergies.  MEDICATIONS:  Current Outpatient Medications  Medication Sig Dispense Refill   acetaminophen (TYLENOL) 650 MG CR tablet SMARTSIG:2 Tablet(s) By Mouth Twice Daily     ASPIRIN LOW DOSE 81 MG tablet Take 81 mg by mouth daily.     carvedilol (COREG) 3.125 MG tablet Take 3.125 mg by mouth 2 (two) times daily.     cholecalciferol (VITAMIN D3) 25 MCG (1000 UNIT) tablet Take 1,000 Units by mouth daily.     darolutamide (NUBEQA) 300 MG tablet Take 600 mg by mouth 2 (two) times daily with a meal.     glucose blood (TRUE METRIX BLOOD GLUCOSE TEST) test strip 1 each by Other route as needed for other. Use as instructed     GLUMETZA 1000 MG 24 hr tablet Take 1,000 mg by mouth daily.     JARDIANCE 10 MG TABS tablet Take 10 mg by mouth daily.     LEUPROLIDE ACETATE, 6 MONTH, 45 MG injection Inject into the skin.     LIDODERM 5 % Apply 1 patch to skin every morning as needed for L lower back pain. Remove after 12 hours. Call for refills     lisinopril (ZESTRIL) 2.5 MG tablet Take 2.5 mg by mouth daily.     magnesium oxide (MAG-OX) 400 (240 Mg) MG tablet Take 1 tablet by mouth 2 (two) times daily.      metFORMIN (GLUCOPHAGE-XR) 500 MG 24 hr tablet Take 500 mg by mouth every evening.     Multiple Vitamin (ONE-DAILY MULTI-VITAMIN) TABS Take 1 tablet by mouth daily.     Omega-3 Fatty Acids (OMEGA III EPA+DHA) 1000 MG CAPS Take 1 capsule by mouth 2 (two) times daily.     spironolactone (ALDACTONE) 25 MG tablet Take 0.5 tablets (12.5 mg total) by mouth daily.     tamsulosin (FLOMAX) 0.4 MG CAPS capsule Take 1 capsule (0.4 mg total) by mouth daily. 30 capsule 2   TRUEplus Lancets 28G MISC by Does not apply route.     magnesium oxide (MAG-OX) 400 MG tablet Take 1 tablet by mouth 2 (two) times daily. (Patient not taking: Reported on 12/10/2021)     No current facility-administered medications for this visit.     PHYSICAL EXAMINATION: ECOG PERFORMANCE STATUS: 1 - Symptomatic but completely ambulatory Vitals:   03/14/22 1006  BP: (!) 142/75  Pulse: 71  Temp: (!) 96 F (35.6 C)  SpO2: 99%   Filed Weights   03/14/22 1006  Weight: 192 lb (87.1 kg)    Physical Exam Constitutional:      General: He is not in acute distress. HENT:     Head: Normocephalic and atraumatic.  Eyes:     General: No scleral icterus. Cardiovascular:     Rate and Rhythm: Normal rate and regular rhythm.     Heart sounds: Normal heart sounds.  Pulmonary:     Effort: Pulmonary effort is normal. No respiratory distress.     Breath sounds: No wheezing.  Abdominal:     General: Bowel sounds are normal. There is no distension.     Palpations: Abdomen is soft.  Musculoskeletal:        General: No deformity. Normal range of motion.     Cervical back: Normal range of motion and neck supple.  Skin:    General: Skin is warm and dry.     Findings: No erythema or rash.  Neurological:  Mental Status: He is alert and oriented to person, place, and time. Mental status is at baseline.     Cranial Nerves: No cranial nerve deficit.     Coordination: Coordination normal.  Psychiatric:        Mood and Affect: Mood  normal.     LABORATORY DATA:  I have reviewed the data as listed Lab Results  Component Value Date   WBC 3.9 (L) 03/11/2022   HGB 11.5 (L) 03/11/2022   HCT 35.0 (L) 03/11/2022   MCV 93.3 03/11/2022   PLT 211 03/11/2022   Recent Labs    09/28/21 1347 12/07/21 1449 03/11/22 1006  NA 139 134* 136  K 4.1 3.9 4.0  CL 109 103 101  CO2 '23 24 24  '$ GLUCOSE 140* 145* 129*  BUN '19 16 22  '$ CREATININE 0.72 0.70 0.72  CALCIUM 9.3 9.0 9.3  GFRNONAA >60 >60 >60  PROT 7.4 7.4 7.5  ALBUMIN 3.9 3.8 3.9  AST '22 20 16  '$ ALT '14 14 12  '$ ALKPHOS 61 58 66  BILITOT 0.4 0.5 0.6    Iron/TIBC/Ferritin/ %Sat    Component Value Date/Time   IRON 82 11/23/2020 0953   TIBC 335 11/23/2020 0953   FERRITIN 221 11/23/2020 0953   IRONPCTSAT 25 11/23/2020 0953     RADIOGRAPHIC STUDIES: I have personally reviewed the radiological images as listed and agreed with the findings in the report. VAS [place] AAA DUPLEX  Result Date: 01/04/2022 ABDOMINAL AORTA STUDY Patient Name:  Matthew Mcgrath  Date of Exam:   01/04/2022 Medical Rec #: [id number]       Accession #:    [id number] Date of Birth: 26-Aug-1945       Patient Gender: M Patient Age:   11 years Exam Location:  [facility] Procedure:      VAS [place] AAA DUPLEX Referring Phys: Eulogio Ditch --------------------------------------------------------------------------------  Indications: Bilat CIA dilitation, Rt>Lt  Comparison Study: 12/04/2020 Performing Technologist: Concha Norway RVT  Examination Guidelines: A complete evaluation includes B-mode imaging, spectral Doppler, color Doppler, and power Doppler as needed of all accessible portions of each vessel. Bilateral testing is considered an integral part of a complete examination. Limited examinations for reoccurring indications may be performed as noted.  Abdominal Aorta Findings: +-----------+-------+----------+----------+--------+--------+--------+ Location   AP (cm)Trans (cm)PSV  (cm/s)WaveformThrombusComments +-----------+-------+----------+----------+--------+--------+--------+ Proximal   2.55   2.51      56                                 +-----------+-------+----------+----------+--------+--------+--------+ Mid        2.02   1.92      50                                 +-----------+-------+----------+----------+--------+--------+--------+ Distal     1.99   2.15      57                                 +-----------+-------+----------+----------+--------+--------+--------+ RT CIA Prox2.3    2.2                                          +-----------+-------+----------+----------+--------+--------+--------+ LT CIA Prox1.7    1.7                                          +-----------+-------+----------+----------+--------+--------+--------+  Summary: Abdominal Aorta: The largest aortic measurement is 2.6 cm. No evidence of abdominal aortic aneurysm as has been previously noted in multiple CT and CTA's. Bilateral dilitation of the CIA's ; Rt > LT. Moderate atherosclerosis throughout. No change in CIA diameters since previous ultrasound of 12/04/2020.  *See table(s) above for measurements and observations.  Electronically signed by Leotis Pain MD on 01/04/2022 at 3:30:06 PM.    Final

## 2022-03-14 NOTE — Assessment & Plan Note (Signed)
Asymptomatic. Consider palliative RT if he develops symptoms.

## 2022-03-14 NOTE — Assessment & Plan Note (Signed)
Hemoglobin is stable.  Monitor. 

## 2022-03-14 NOTE — Assessment & Plan Note (Addendum)
#  Metastatic prostate cancer to bone, castration sensitive Eligard 45 mg Q6 months with his PCP at [facility] given in November 2022. Recommend him to receive Eligard 45 mg every 6 months. Labs reviewed and discussed with patient.   Continue Darolutamide 600 mg twice daily. PSA nadir 0.01. PSA is rising 1.45 he is developing castration resistance disease. - possible new bone lesion on previous bone scan.  Check PMSA prior to next visit in 2 months.  Continue current regimen for now and close monitor.   #NGS showed BRCA 2 S1404, BRIP1 R173C 3X, FOXO1 1S318f. 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 Consider possible rucaparib/olaparib in subsequent lines of treatment

## 2022-03-31 ENCOUNTER — Ambulatory Visit
Admission: RE | Admit: 2022-03-31 | Discharge: 2022-03-31 | Disposition: A | Discharge status: Home / Self Care | Payer: Medicare (Managed Care) | Source: Ambulatory Visit | Attending: Oncology | Admitting: Oncology

## 2022-03-31 DIAGNOSIS — C7951 Secondary malignant neoplasm of bone: Secondary | ICD-10-CM | POA: Insufficient documentation

## 2022-03-31 DIAGNOSIS — C61 Malignant neoplasm of prostate: Secondary | ICD-10-CM | POA: Diagnosis present

## 2022-03-31 DIAGNOSIS — I700 Atherosclerosis of aorta: Secondary | ICD-10-CM | POA: Insufficient documentation

## 2022-03-31 MED ORDER — PIFLIFOLASTAT F 18 (PYLARIFY) INJECTION
9.0000 | Freq: Once | INTRAVENOUS | Status: AC
  Administered 2022-03-31: 9.86 via INTRAVENOUS

## 2022-05-03 ENCOUNTER — Ambulatory Visit
Admission: RE | Admit: 2022-05-03 | Discharge: 2022-05-03 | Disposition: A | Discharge status: Home / Self Care | Payer: Medicare (Managed Care) | Source: Ambulatory Visit | Attending: Radiation Oncology | Admitting: Radiation Oncology

## 2022-05-03 ENCOUNTER — Encounter: Payer: Self-pay | Admitting: Radiation Oncology

## 2022-05-03 VITALS — BP 150/99 | HR 84 | Temp 97.6°F | Resp 12

## 2022-05-03 DIAGNOSIS — C61 Malignant neoplasm of prostate: Secondary | ICD-10-CM

## 2022-05-03 NOTE — Consult Note (Signed)
NEW PATIENT EVALUATION  Name: Matthew Mcgrath  MRN: [id number]  Date:   05/03/2022     DOB: Feb 03, 1946   This 77 y.o. 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  REFERRING PHYSICIAN: [facility], [place]:  Chief Complaint  Patient presents with   Prostate cancer with bone mets    DIAGNOSIS: There were no encounter diagnoses.   PREVIOUS INVESTIGATIONS:  PSMA PET scan reviewed Pathology reports reviewed Clinical notes reviewed  HPI: Patient is a 77 year old male currently on ADT therapy with Eligard with rising PSA developing into castrate resistant cancer.  He does have a BRCA2 mutation.  He is asymptomatic.  Recent PSMA PET scan showed 2 lesions in the sternum 1 in the posterior medial lateral left ninth rib with a small descending thoracic aortic lymph node site of possible potential metastatic disease.  He is seen today for consideration of palliative radiation therapy to these regions.  He specifically denies back pain or sternal pain.    PLANNED TREATMENT REGIMEN: Palliative radiation therapy to sternum parous final mass as well as aortic lymph node  PAST MEDICAL HISTORY:  has a past medical history of Cardiomyopathy ([place]), Congestive heart failure ([place]), Diabetes mellitus type 2, controlled ([place]), Elevated lipids, Family history of breast cancer, Family history of colon cancer, and HTN (hypertension).    PAST SURGICAL HISTORY:  Past Surgical History:  Procedure Laterality Date   CARDIAC CATHETERIZATION N/A 04/14/2015   Procedure: Left Heart Cath and Coronary Angiography;  Surgeon: [name], MD;  Location: [facility];  Service: Cardiovascular;  Laterality: N/A;   CATARACT EXTRACTION W/ INTRAOCULAR LENS  IMPLANT, BILATERAL Left    COLONOSCOPY WITH PROPOFOL N/A 04/08/2020   Procedure: COLONOSCOPY WITH  PROPOFOL;  Surgeon: Tol[place], Benay Pike, MD;  Location: [facility];  Service: Gastroenterology;  Laterality: N/A;   CYSTOSCOPY W/ RETROGRADES Bilateral 07/02/2021   Procedure: CYSTOSCOPY WITH RETROGRADE PYELOGRAM;  Surgeon: Billey Co, MD;  Location: [facility];  Service: Urology;  Laterality: Bilateral;   HERNIA REPAIR     IMPLANTABLE CARDIOVERTER DEFIBRILLATOR (ICD) GENERATOR CHANGE Left 07/03/2015   Procedure: ICD IMPLANT single chamber;  Surgeon: Marzetta Board, MD;  Location: [facility];  Service: Cardiovascular;  Laterality: Left;   TRANSURETHRAL RESECTION OF BLADDER TUMOR N/A 07/02/2021   Procedure: TRANSURETHRAL RESECTION OF BLADDER TUMOR (TURBT);  Surgeon: Billey Co, MD;  Location: [facility];  Service: Urology;  Laterality: N/A;    FAMILY HISTORY: family history includes Alcoholism in his father; Breast cancer in his cousin; Colon cancer in his brother; Diabetes in his brother and sister; Heart disease in his mother; Heart failure in his mother; Hypertension in his brother and sister; Multiple myeloma in his brother.  SOCIAL HISTORY:  reports that he quit smoking about 2 years ago. His smoking use included cigarettes. He has a 7.50 pack-year smoking history. He has never used smokeless tobacco. He reports that he does not currently use drugs after having used the following drugs: Marijuana. Frequency: 2.00 times per week. He reports that he does not drink alcohol.  ALLERGIES: Patient has no known allergies.  MEDICATIONS:  Current Outpatient Medications  Medication Sig Dispense Refill   acetaminophen (TYLENOL) 650 MG CR tablet SMARTSIG:2 Tablet(s) By Mouth Twice Daily     ASPIRIN LOW DOSE 81 MG tablet Take 81 mg by mouth daily.  carvedilol (COREG) 3.125 MG tablet Take 3.125 mg by mouth 2 (two) times daily.     cholecalciferol (VITAMIN D3) 25 MCG (1000 UNIT) tablet Take 1,000 Units by mouth daily.     darolutamide (NUBEQA) 300 MG tablet Take 600 mg by mouth 2 (two) times daily  with a meal.     glucose blood (TRUE METRIX BLOOD GLUCOSE TEST) test strip 1 each by Other route as needed for other. Use as instructed     GLUMETZA 1000 MG 24 hr tablet Take 1,000 mg by mouth daily.     JARDIANCE 10 MG TABS tablet Take 10 mg by mouth daily.     LEUPROLIDE ACETATE, 6 MONTH, 45 MG injection Inject into the skin.     LIDODERM 5 % Apply 1 patch to skin every morning as needed for L lower back pain. Remove after 12 hours. Call for refills     lisinopril (ZESTRIL) 2.5 MG tablet Take 2.5 mg by mouth daily.     magnesium oxide (MAG-OX) 400 (240 Mg) MG tablet Take 1 tablet by mouth 2 (two) times daily.     metFORMIN (GLUCOPHAGE-XR) 500 MG 24 hr tablet Take 500 mg by mouth every evening.     Multiple Vitamin (ONE-DAILY MULTI-VITAMIN) TABS Take 1 tablet by mouth daily.     Omega-3 Fatty Acids (OMEGA III EPA+DHA) 1000 MG CAPS Take 1 capsule by mouth 2 (two) times daily.     spironolactone (ALDACTONE) 25 MG tablet Take 0.5 tablets (12.5 mg total) by mouth daily.     tamsulosin (FLOMAX) 0.4 MG CAPS capsule Take 1 capsule (0.4 mg total) by mouth daily. 30 capsule 2   TRUEplus Lancets 28G MISC by Does not apply route.     magnesium oxide (MAG-OX) 400 MG tablet Take 1 tablet by mouth 2 (two) times daily. (Patient not taking: Reported on 12/10/2021)     No current facility-administered medications for this encounter.    ECOG PERFORMANCE STATUS:  0 - Asymptomatic  REVIEW OF SYSTEMS: Patient has a history of cardiomyopathy congestive heart failure type 2 diabetes. Patient denies any weight loss, fatigue, weakness, fever, chills or night sweats. Patient denies any loss of vision, blurred vision. Patient denies any ringing  of the ears or hearing loss. No irregular heartbeat. Patient denies heart murmur or history of fainting. Patient denies any chest pain or pain radiating to her upper extremities. Patient denies any shortness of breath, difficulty breathing at night, cough or hemoptysis. Patient  denies any swelling in the lower legs. Patient denies any nausea vomiting, vomiting of blood, or coffee ground material in the vomitus. Patient denies any stomach pain. Patient states has had normal bowel movements no significant constipation or diarrhea. Patient denies any dysuria, hematuria or significant nocturia. Patient denies any problems walking, swelling in the joints or loss of balance. Patient denies any skin changes, loss of hair or loss of weight. Patient denies any excessive worrying or anxiety or significant depression. Patient denies any problems with insomnia. Patient denies excessive thirst, polyuria, polydipsia. Patient denies any swollen glands, patient denies easy bruising or easy bleeding. Patient denies any recent infections, allergies or URI. Patient "s visual fields have not changed significantly in recent time.   PHYSICAL EXAM: BP (!) 150/99 (BP Location: Right Arm, Patient Position: Sitting, Cuff Size: Normal)   Pulse 84   Temp 97.6 F (36.4 C) (Tympanic)   Resp 12  Wheelchair-bound male poor historian.  No pain is elicited on deep palpation of his spine  or sternal region.  Motor and sensory levels are equal and symmetric in the lower extremities.  Well-developed well-nourished patient in NAD. HEENT reveals PERLA, EOMI, discs not visualized.  Oral cavity is clear. No oral mucosal lesions are identified. Neck is clear without evidence of cervical or supraclavicular adenopathy. Lungs are clear to A&P. Cardiac examination is essentially unremarkable with regular rate and rhythm without murmur rub or thrill. Abdomen is benign with no organomegaly or masses noted. Motor sensory and DTR levels are equal and symmetric in the upper and lower extremities. Cranial nerves II through XII are grossly intact. Proprioception is intact. No peripheral adenopathy or edema is identified. No motor or sensory levels are noted. Crude visual fields are within normal range.  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    RADIOLOGY RESULTS: PSMA PET CT scan reviewed compatible with above-stated findings   IMPRESSION: This time lets go ahead with palliative radiation therapy to sternal lesions as well as his parasternal lesion as well as thoracic aortic lymph node which I can all [facility] in 1 field.  I would plan on delivering 30 Gray in 10 fractions to those sites.  Risks and benefits of treatment occluding skin reaction fatigue alteration of blood counts possible slight bowel change all were discussed in detail with the patient and his caregiver.  They both seem to comprehend the treatment plan well I personally set up and ordered CT simulation for next week.  I would like to take this opportunity to thank you for allowing me to participate in the care of your patient.Marland Kitchen  PLAN: As above  Noreene Filbert, MD

## 2022-05-10 ENCOUNTER — Ambulatory Visit
Admission: RE | Admit: 2022-05-10 | Discharge: 2022-05-10 | Disposition: A | Discharge status: Home / Self Care | Payer: Medicare (Managed Care) | Source: Ambulatory Visit | Attending: Radiation Oncology | Admitting: Radiation Oncology

## 2022-05-10 DIAGNOSIS — I509 Heart failure, unspecified: Secondary | ICD-10-CM | POA: Diagnosis not present

## 2022-05-10 DIAGNOSIS — M549 Dorsalgia, unspecified: Secondary | ICD-10-CM | POA: Insufficient documentation

## 2022-05-10 DIAGNOSIS — Z803 Family history of malignant neoplasm of breast: Secondary | ICD-10-CM | POA: Diagnosis not present

## 2022-05-10 DIAGNOSIS — Z1509 Genetic susceptibility to other malignant neoplasm: Secondary | ICD-10-CM | POA: Insufficient documentation

## 2022-05-10 DIAGNOSIS — Z87891 Personal history of nicotine dependence: Secondary | ICD-10-CM | POA: Insufficient documentation

## 2022-05-10 DIAGNOSIS — E119 Type 2 diabetes mellitus without complications: Secondary | ICD-10-CM | POA: Insufficient documentation

## 2022-05-10 DIAGNOSIS — Z510 Encounter for antineoplastic radiation therapy: Secondary | ICD-10-CM | POA: Diagnosis present

## 2022-05-10 DIAGNOSIS — R4189 Other symptoms and signs involving cognitive functions and awareness: Secondary | ICD-10-CM | POA: Insufficient documentation

## 2022-05-10 DIAGNOSIS — I110 Hypertensive heart disease with heart failure: Secondary | ICD-10-CM | POA: Insufficient documentation

## 2022-05-10 DIAGNOSIS — D649 Anemia, unspecified: Secondary | ICD-10-CM | POA: Diagnosis not present

## 2022-05-10 DIAGNOSIS — G8929 Other chronic pain: Secondary | ICD-10-CM | POA: Diagnosis not present

## 2022-05-10 DIAGNOSIS — Z1501 Genetic susceptibility to malignant neoplasm of breast: Secondary | ICD-10-CM | POA: Diagnosis not present

## 2022-05-10 DIAGNOSIS — C61 Malignant neoplasm of prostate: Secondary | ICD-10-CM | POA: Insufficient documentation

## 2022-05-10 DIAGNOSIS — Z800 Family history of malignant neoplasm of digestive organs: Secondary | ICD-10-CM | POA: Insufficient documentation

## 2022-05-10 DIAGNOSIS — C7951 Secondary malignant neoplasm of bone: Secondary | ICD-10-CM | POA: Insufficient documentation

## 2022-05-11 DIAGNOSIS — Z510 Encounter for antineoplastic radiation therapy: Secondary | ICD-10-CM | POA: Diagnosis not present

## 2022-05-12 DIAGNOSIS — Z510 Encounter for antineoplastic radiation therapy: Secondary | ICD-10-CM | POA: Diagnosis not present

## 2022-05-13 ENCOUNTER — Inpatient Hospital Stay: Payer: Medicare (Managed Care)

## 2022-05-13 DIAGNOSIS — M549 Dorsalgia, unspecified: Secondary | ICD-10-CM | POA: Insufficient documentation

## 2022-05-13 DIAGNOSIS — C61 Malignant neoplasm of prostate: Secondary | ICD-10-CM | POA: Insufficient documentation

## 2022-05-13 DIAGNOSIS — I509 Heart failure, unspecified: Secondary | ICD-10-CM | POA: Insufficient documentation

## 2022-05-13 DIAGNOSIS — Z1501 Genetic susceptibility to malignant neoplasm of breast: Secondary | ICD-10-CM | POA: Insufficient documentation

## 2022-05-13 DIAGNOSIS — I110 Hypertensive heart disease with heart failure: Secondary | ICD-10-CM | POA: Insufficient documentation

## 2022-05-13 DIAGNOSIS — Z87891 Personal history of nicotine dependence: Secondary | ICD-10-CM | POA: Insufficient documentation

## 2022-05-13 DIAGNOSIS — Z800 Family history of malignant neoplasm of digestive organs: Secondary | ICD-10-CM | POA: Insufficient documentation

## 2022-05-13 DIAGNOSIS — Z803 Family history of malignant neoplasm of breast: Secondary | ICD-10-CM | POA: Insufficient documentation

## 2022-05-13 DIAGNOSIS — Z1509 Genetic susceptibility to other malignant neoplasm: Secondary | ICD-10-CM | POA: Insufficient documentation

## 2022-05-13 DIAGNOSIS — E119 Type 2 diabetes mellitus without complications: Secondary | ICD-10-CM | POA: Insufficient documentation

## 2022-05-13 DIAGNOSIS — C7951 Secondary malignant neoplasm of bone: Secondary | ICD-10-CM | POA: Insufficient documentation

## 2022-05-13 DIAGNOSIS — G8929 Other chronic pain: Secondary | ICD-10-CM | POA: Insufficient documentation

## 2022-05-13 DIAGNOSIS — R4189 Other symptoms and signs involving cognitive functions and awareness: Secondary | ICD-10-CM | POA: Insufficient documentation

## 2022-05-13 DIAGNOSIS — D649 Anemia, unspecified: Secondary | ICD-10-CM | POA: Insufficient documentation

## 2022-05-13 DIAGNOSIS — Z510 Encounter for antineoplastic radiation therapy: Secondary | ICD-10-CM | POA: Diagnosis not present

## 2022-05-13 LAB — COMPREHENSIVE METABOLIC PANEL
ALT: 13 U/L (ref 0–44)
AST: 19 U/L (ref 15–41)
Albumin: 3.9 g/dL (ref 3.5–5.0)
Alkaline Phosphatase: 56 U/L (ref 38–126)
Anion gap: 9 (ref 5–15)
BUN: 17 mg/dL (ref 8–23)
CO2: 23 mmol/L (ref 22–32)
Calcium: 9.5 mg/dL (ref 8.9–10.3)
Chloride: 107 mmol/L (ref 98–111)
Creatinine, Ser: 0.83 mg/dL (ref 0.61–1.24)
GFR, Estimated: >60 mL/min (ref >60)
Glucose, Bld: 212 mg/dL — ABNORMAL HIGH (ref 70–99)
Potassium: 4.1 mmol/L (ref 3.5–5.1)
Sodium: 139 mmol/L (ref 135–145)
Total Bilirubin: 0.5 mg/dL (ref 0.3–1.2)
Total Protein: 7.5 g/dL (ref 6.5–8.1)

## 2022-05-13 LAB — CBC WITH DIFFERENTIAL/PLATELET
Abs Immature Granulocytes: 0.01 10*3/uL (ref 0.00–0.07)
Basophils Absolute: 0 10*3/uL (ref 0.0–0.1)
Basophils Relative: 1 %
Eosinophils Absolute: 0 10*3/uL (ref 0.0–0.5)
Eosinophils Relative: 1 %
HCT: 34.4 % — ABNORMAL LOW (ref 39.0–52.0)
Hemoglobin: 10.9 g/dL — ABNORMAL LOW (ref 13.0–17.0)
Immature Granulocytes: 0 %
Lymphocytes Relative: 20 %
Lymphs Abs: 0.8 10*3/uL (ref 0.7–4.0)
MCH: 30.9 pg (ref 26.0–34.0)
MCHC: 31.7 g/dL (ref 30.0–36.0)
MCV: 97.5 fL (ref 80.0–100.0)
Monocytes Absolute: 0.3 10*3/uL (ref 0.1–1.0)
Monocytes Relative: 9 %
Neutro Abs: 2.6 10*3/uL (ref 1.7–7.7)
Neutrophils Relative %: 69 %
Platelets: 212 10*3/uL (ref 150–400)
RBC: 3.53 MIL/uL — ABNORMAL LOW (ref 4.22–5.81)
RDW: 14.4 % (ref 11.5–15.5)
WBC: 3.7 10*3/uL — ABNORMAL LOW (ref 4.0–10.5)
nRBC: 0 % (ref 0.0–0.2)

## 2022-05-13 LAB — PSA: Prostatic Specific Antigen: 2.86 ng/mL (ref 0.00–4.00)

## 2022-05-17 ENCOUNTER — Inpatient Hospital Stay (HOSPITAL_BASED_OUTPATIENT_CLINIC_OR_DEPARTMENT_OTHER): Payer: Medicare (Managed Care) | Admitting: Oncology

## 2022-05-17 ENCOUNTER — Encounter: Payer: Self-pay | Admitting: Oncology

## 2022-05-17 ENCOUNTER — Ambulatory Visit
Admission: RE | Admit: 2022-05-17 | Discharge: 2022-05-17 | Disposition: A | Discharge status: Home / Self Care | Payer: Medicare (Managed Care) | Source: Ambulatory Visit | Attending: Radiation Oncology | Admitting: Radiation Oncology

## 2022-05-17 VITALS — BP 135/80 | HR 81 | Temp 97.6°F | Resp 18 | Wt 189.9 lb

## 2022-05-17 DIAGNOSIS — Z5111 Encounter for antineoplastic chemotherapy: Secondary | ICD-10-CM | POA: Diagnosis not present

## 2022-05-17 DIAGNOSIS — D649 Anemia, unspecified: Secondary | ICD-10-CM | POA: Diagnosis not present

## 2022-05-17 DIAGNOSIS — R4189 Other symptoms and signs involving cognitive functions and awareness: Secondary | ICD-10-CM

## 2022-05-17 DIAGNOSIS — C7951 Secondary malignant neoplasm of bone: Secondary | ICD-10-CM

## 2022-05-17 DIAGNOSIS — C61 Malignant neoplasm of prostate: Secondary | ICD-10-CM | POA: Diagnosis not present

## 2022-05-17 DIAGNOSIS — Z510 Encounter for antineoplastic radiation therapy: Secondary | ICD-10-CM | POA: Diagnosis not present

## 2022-05-17 NOTE — Assessment & Plan Note (Signed)
Treatment plan as listed above. 

## 2022-05-17 NOTE — Assessment & Plan Note (Addendum)
#  Metastatic prostate cancer to bone, castration sensitive Eligard 45 mg Q6 months with his PCP at [facility] given in November 2022. Recommend him to receive Eligard 45 mg every 6 months via [facility] reviewed and discussed with patient.   PSA nadir 0.01. PSA is rising 2.86 he is developing castration resistance disease. PMSA results were reviewed with patient, his brother, also I called his sister and discussed.  Follow up with RadOnc and finish palliative radiation.  Continue Darolutamide 600 mg twice daily.  #NGS showed BRCA 2 S1404, BRIP1 R173C 3X, FOXO1 1S370fs. 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  I recommend patient's family to discuss with patient's PCP and palliative care to clarify goals of care. If family is interested in further treatment, will consider next line treatment rucaparib or olaparib if PSA continues to rise despite radiation.

## 2022-05-17 NOTE — Assessment & Plan Note (Addendum)
Hb is slightly decreased.  Monitor.

## 2022-05-17 NOTE — Progress Notes (Signed)
Hematology/Oncology Progress note [phone] [phone]     CHIEF COMPLAINTS/REASON FOR VISIT:  metastatic prostate cancer   ASSESSMENT & PLAN:   Cancer Staging  Prostate cancer [name]) Staging form: Prostate, AJCC 8th Edition - Clinical stage from 05/11/2020: Stage IVB (cTX, cN0, cM1, PSA: 30) - Signed by [name], MD on 05/11/2020   Prostate cancer [facility]) #Metastatic prostate cancer to bone, castration sensitive Eligard 45 mg Q6 months with his PCP at [facility] given in November 2022. Recommend him to receive Eligard 45 mg every 6 months via [facility] reviewed and discussed with patient.   PSA nadir 0.01. PSA is rising 2.86 he is developing castration resistance disease. PMSA results were reviewed with patient, his brother, also I called his sister and discussed.  Follow up with RadOnc and finish palliative radiation.  Continue Darolutamide 600 mg twice daily.  #NGS showed BRCA 2 S1404, BRIP1 R173C 3X, FOXO1 1S311f. 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  I recommend patient's family to discuss with patient's PCP and palliative care to clarify goals of care. If family is interested in further treatment, will consider next line treatment rucaparib or olaparib if PSA continues to rise despite radiation.  Encounter for antineoplastic chemotherapy Treatment plan as listed above  Metastasis to bone ([facility] Finish palliative RT Consider bone strengthen agent in the future  Anemia Hb is slightly decreased.  Monitor.   Cognitive impairment Continue follow up with primary care provider  Orders Placed This Encounter  Procedures   CBC with Differential ([place])    Standing Status:   Future    Standing Expiration Date:   05/17/2023   CMP ([place])    Standing Status:   Future    Standing Expiration Date:   05/17/2023   PSA    Standing Status:   Future    Standing Expiration Date:   05/17/2023    Follow up 6 weeks.  All questions were answered. The patient knows to call the clinic with any problems, questions or concerns.  ZEarlie Server MD, PhD [facility]cology 05/17/2022    HISTORY OF PRESENTING ILLNESS:   Matthew Mcgrath a  77y.o.  male presents for metastatic prostate cancer. Oncology History  Prostate cancer ([place]  04/16/2020 Procedure   Firmagon 240 mg loading dose   04/21/2020 Imaging   CT abdomen pelvis showed a sclerotic lesion in the lumbar spine concerning for probable metastatic disease.  No definitive extra skeletal metastatic disease noted elsewhere in the abdomen or pelvis.  Mild circumferential bladder wall thickening.  Aortic atherosclerosis.   05/11/2020 Initial Diagnosis   Prostate cancer   04/01/2020, prostate biopsy showed  All cores were positive for high risk prostate cancer, primarily Gleason score 5+4 = 9, with max core involvement of 100% and perineural invasion present.   05/11/2020 Cancer Staging   Staging form: Prostate, AJCC 8th Edition - Clinical stage from 05/11/2020: Stage IVB (cTX, cN0, cM1, PSA: 30) - Signed by YEarlie Server MD on 05/11/2020 Prostate specific antigen (PSA) range: 20 or greater   10/19/2020 -  Radiation Therapy   finished definitive RT. to prostate cancer and pelvic lymph node treatments.   02/13/2021 Imaging   CT chest abdomen pelvis without contrast showed slight interval increase in sclerosis of previously osseous metastasis involving lumbar spine, most consistent with posttreatment sclerosis.  Focally severe disc degeneration and sclerosis versus sclerotic osseous metastatic of T6 and 7.  Attention on follow-up.  No noncontrast evidence of lymphadenopathy or soft tissue metastatic disease in chest abdomen pelvis.  Prostamegaly.  Enlargement of main pulmonary artery.  Coronary artery disease.   02/21/2021 Genetic Testing   [employer] Multi-Cancer+RNA cancer panel found a single, pathogenic variant in BRCA2 called  [id number]. The remainder of testing was negative/normal.      03/11/2021 Imaging   , bone scan showed improved/decreased uptake in T6 and L2   04/27/2021 Imaging   Bone scan bone scan showed foci of abnormal uptake noted in the mid thoracic and upper lumbar spine concerning for metastatic disease   12/07/2021 Imaging   CT chest abdomen pelvis wo contrast 1. Mild prostatomegaly without discrete mass appreciated by CT. 2. Unchanged sclerotic osseous metastatic disease, for example of the L1 and L2 vertebral bodies. 3. Unchanged, focally severe disc degenerative disease at T6-T7 with sclerosis of the vertebral bodies, most likely degenerative change although underlying osseous metastasis again not excluded. Continued attention on follow-up. 4. No noncontrast evidence of lymphadenopathy or soft tissue metastatic disease to the chest, abdomen, or pelvis. 5. Enlargement of the main pulmonary artery, as can be seen in pulmonary hypertension. 6. Coronary artery disease.   12/07/2021 Imaging   Bone scan showed 1. There is faint activity in the upper lumbar region, approximate L2 level, likely corresponds to faint sclerosis/metastasis on CT 2. New focus of activity at the left inferior sternum and right sixth rib anteriorly, possibly metastatic but no definitive corresponding lesion on CT. 3. New prominent activity at the left first rib end/sternoclavicular junction potentially degenerative, given appearance on CT. 4. Probable degenerative uptake at the bilateral feet, shoulders,and midthoracic spine   #06/11/2020 patient went to [facility] for second opinion was seen by [name] who agrees with continuing ADT with switch to Eligard 45 mg.  He is due to proceed with the treatments today.  Communicated with pace program [name], patient will receive treatment at [place]. [name] also recommends to start darolutamide and authorization process has been initiated at [facility]. #07/06/2020 started darolutamide 600 mg twice  daily # definitive treatment for prostate and pelvic node- Discussed with [facility] [name] and I appreciate his expert opinion of darolutamide concurrent use with RT and ADT  INTERVAL HISTORY Matthew Mcgrath is a 77 y.o. male who has above history reviewed by me today presents for follow up visit for management of metastatic prostate cancer Patient was accompanied by his brother.  He is currently on Darolutamide 600 mg twice daily denies any new complaints.  He denies any new bone pain.  Chronic back pain unchanged. Accompanied by brother. Cognitive impairment is worse recently.   Review of Systems  Constitutional:  Positive for fatigue. Negative for appetite change, chills, fever and unexpected weight change.  HENT:   Negative for hearing loss and voice change.   Eyes:  Negative for eye problems and icterus.  Respiratory:  Negative for chest tightness, cough and shortness of breath.   Cardiovascular:  Negative for chest pain and leg swelling.  Gastrointestinal:  Negative for abdominal distention and abdominal pain.  Endocrine: Negative for hot flashes.  Genitourinary:  Negative for difficulty urinating, dysuria and frequency.   Musculoskeletal:  Positive for back pain. Negative for arthralgias.  Skin:  Negative for itching and rash.  Neurological:  Negative for light-headedness and numbness.  Hematological:  Negative for adenopathy. Does not bruise/bleed easily.  Psychiatric/Behavioral:  Positive for confusion.     MEDICAL HISTORY:  Past Medical History:  Diagnosis Date  Cardiomyopathy ([place])    Congestive heart failure ([facility])    Diabetes mellitus type 2, controlled ([place])    Elevated lipids    Family history of breast cancer    Family history of colon cancer    HTN (hypertension)     SURGICAL HISTORY: Past Surgical History:  Procedure Laterality Date   CARDIAC CATHETERIZATION N/A 04/14/2015   Procedure: Left Heart Cath and Coronary Angiography;  Surgeon: [name], MD;   Location: [place] [facility];  Service: Cardiovascular;  Laterality: N/A;   CATARACT EXTRACTION W/ INTRAOCULAR LENS  IMPLANT, BILATERAL Left    COLONOSCOPY WITH PROPOFOL N/A 04/08/2020   Procedure: COLONOSCOPY WITH PROPOFOL;  Surgeon: Toledo, Benay Pike, MD;  Location: [facility];  Service: Gastroenterology;  Laterality: N/A;   CYSTOSCOPY W/ RETROGRADES Bilateral 07/02/2021   Procedure: CYSTOSCOPY WITH RETROGRADE PYELOGRAM;  Surgeon: Billey Co, MD;  Location: [facility];  Service: Urology;  Laterality: Bilateral;   HERNIA REPAIR     IMPLANTABLE CARDIOVERTER DEFIBRILLATOR (ICD) GENERATOR CHANGE Left 07/03/2015   Procedure: ICD IMPLANT single chamber;  Surgeon: Marzetta Board, MD;  Location: [facility];  Service: Cardiovascular;  Laterality: Left;   TRANSURETHRAL RESECTION OF BLADDER TUMOR N/A 07/02/2021   Procedure: TRANSURETHRAL RESECTION OF BLADDER TUMOR (TURBT);  Surgeon: Billey Co, MD;  Location: [facility];  Service: Urology;  Laterality: N/A;    SOCIAL HISTORY: Social History   Socioeconomic History   Marital status: Single    Spouse name: Not on file   Number of children: Not on file   Years of education: Not on file   Highest education level: Not on file  Occupational History   Not on file  Tobacco Use   Smoking status: Former    Packs/day: 0.25    Years: 30.00    Total pack years: 7.50    Types: Cigarettes    Quit date: 02/11/2020    Years since quitting: 2.2   Smokeless tobacco: Never  Vaping Use   Vaping Use: Never used  Substance and Sexual Activity   Alcohol use: No   Drug use: Not Currently    Frequency: 2.0 times per week    Types: Marijuana    Comment: past use   Sexual activity: Not on file  Other Topics Concern   Not on file  Social History Narrative   Not on file   Social Determinants of Health   Financial Resource Strain: Not on file  Food Insecurity: Not on file  Transportation Needs: Not on file  Physical Activity: Not on file  Stress:  Not on file  Social Connections: Not on file  Intimate Partner Violence: Not on file    FAMILY HISTORY: Family History  Problem Relation Age of Onset   Heart failure Mother    Heart disease Mother    Alcoholism Father    Hypertension Sister    Diabetes Sister    Hypertension Brother    Diabetes Brother    Colon cancer Brother        vs prostate   Multiple myeloma Brother    Breast cancer Cousin     ALLERGIES:  has No Known Allergies.  MEDICATIONS:  Current Outpatient Medications  Medication Sig Dispense Refill   acetaminophen (TYLENOL) 650 MG CR tablet SMARTSIG:2 Tablet(s) By Mouth Twice Daily     ASPIRIN LOW DOSE 81 MG tablet Take 81 mg by mouth daily.     carvedilol (COREG) 3.125 MG tablet Take 3.125 mg by mouth 2 (two)  times daily.     cholecalciferol (VITAMIN D3) 25 MCG (1000 UNIT) tablet Take 1,000 Units by mouth daily.     darolutamide (NUBEQA) 300 MG tablet Take 600 mg by mouth 2 (two) times daily with a meal.     glucose blood (TRUE METRIX BLOOD GLUCOSE TEST) test strip 1 each by Other route as needed for other. Use as instructed     GLUMETZA 1000 MG 24 hr tablet Take 1,000 mg by mouth daily.     JARDIANCE 10 MG TABS tablet Take 10 mg by mouth daily.     LEUPROLIDE ACETATE, 6 MONTH, 45 MG injection Inject into the skin.     LIDODERM 5 % Apply 1 patch to skin every morning as needed for L lower back pain. Remove after 12 hours. Call for refills     lisinopril (ZESTRIL) 2.5 MG tablet Take 2.5 mg by mouth daily.     magnesium oxide (MAG-OX) 400 (240 Mg) MG tablet Take 1 tablet by mouth 2 (two) times daily.     metFORMIN (GLUCOPHAGE-XR) 500 MG 24 hr tablet Take 500 mg by mouth every evening.     Multiple Vitamin (ONE-DAILY MULTI-VITAMIN) TABS Take 1 tablet by mouth daily.     Omega-3 Fatty Acids (OMEGA III EPA+DHA) 1000 MG CAPS Take 1 capsule by mouth 2 (two) times daily.     spironolactone (ALDACTONE) 25 MG tablet Take 0.5 tablets (12.5 mg total) by mouth daily.      tamsulosin (FLOMAX) 0.4 MG CAPS capsule Take 1 capsule (0.4 mg total) by mouth daily. 30 capsule 2   TRUEplus Lancets 28G MISC by Does not apply route.     No current facility-administered medications for this visit.     PHYSICAL EXAMINATION: ECOG PERFORMANCE STATUS: 1 - Symptomatic but completely ambulatory Vitals:   05/17/22 1100  BP: 135/80  Pulse: 81  Resp: 18  Temp: 97.6 F (36.4 C)   Filed Weights   05/17/22 1100  Weight: 189 lb 14.4 oz (86.1 kg)    Physical Exam Constitutional:      General: He is not in acute distress. HENT:     Head: Normocephalic.  Eyes:     General: No scleral icterus. Cardiovascular:     Rate and Rhythm: Normal rate.  Pulmonary:     Effort: Pulmonary effort is normal. No respiratory distress.  Abdominal:     General: Bowel sounds are normal. There is no distension.     Palpations: Abdomen is soft.  Musculoskeletal:        General: No swelling.     Cervical back: Normal range of motion and neck supple.  Skin:    General: Skin is warm.     Findings: No rash.  Neurological:     Mental Status: He is alert. Mental status is at baseline.     Cranial Nerves: No cranial nerve deficit.     Comments: Oriented x 1  Psychiatric:        Mood and Affect: Mood normal.     LABORATORY DATA:  I have reviewed the data as listed    Latest Ref Rng & Units 05/13/2022   10:27 AM 03/11/2022   10:06 AM 12/07/2021    2:49 PM  CBC  WBC 4.0 - 10.5 K/uL 3.7  3.9  3.5   Hemoglobin 13.0 - 17.0 g/dL 10.9  11.5  11.7   Hematocrit 39.0 - 52.0 % 34.4  35.0  36.5   Platelets 150 - 400 K/uL 212  211  204       Latest Ref Rng & Units 05/13/2022   10:27 AM 03/11/2022   10:06 AM 12/07/2021    2:49 PM  CMP  Glucose 70 - 99 mg/dL 212  129  145   BUN 8 - 23 mg/dL '17  22  16   '$ Creatinine 0.61 - 1.24 mg/dL 0.83  0.72  0.70   Sodium 135 - 145 mmol/L 139  136  134   Potassium 3.5 - 5.1 mmol/L 4.1  4.0  3.9   Chloride 98 - 111 mmol/L 107  101  103   CO2 22 - 32 mmol/L  '23  24  24   '$ Calcium 8.9 - 10.3 mg/dL 9.5  9.3  9.0   Total Protein 6.5 - 8.1 g/dL 7.5  7.5  7.4   Total Bilirubin 0.3 - 1.2 mg/dL 0.5  0.6  0.5   Alkaline Phos 38 - 126 U/L 56  66  58   AST 15 - 41 U/L '19  16  20   '$ ALT 0 - 44 U/L '13  12  14     '$ Iron/TIBC/Ferritin/ %Sat    Component Value Date/Time   IRON 82 11/23/2020 0953   TIBC 335 11/23/2020 0953   FERRITIN 221 11/23/2020 0953   IRONPCTSAT 25 11/23/2020 0953     RADIOGRAPHIC STUDIES: I have personally reviewed the radiological images as listed and agreed with the findings in the report. NM PET (PSMA) SKULL TO MID THIGH  Result Date: 04/04/2022 CLINICAL DATA:  Prostate cancer with bone metastasis. EXAM: NUCLEAR MEDICINE PET SKULL BASE TO THIGH TECHNIQUE: 9.9 mCi F18 Piflufolastat (Pylarify) was injected intravenously. Full-ring PET imaging was performed from the skull base to thigh after the radiotracer. CT data was obtained and used for attenuation correction and anatomic localization. COMPARISON:  Bone scan 12/07/2021 CT scan 12/07/2021 FINDINGS: NECK No radiotracer activity in neck lymph nodes. Incidental CT finding: None. CHEST Small focus radiotracer activity along the ventral border of the descending thoracic aorta with SUV max equal 6.9 on image 112. There is no clear correlating lesion on the CT portion exam. No additional radiotracer avid mediastinal lymph nodes. No supraclavicular nodes. Incidental CT finding: No suspicious pulmonary nodules. ABDOMEN/PELVIS Prostate: No focal activity within the prostate gland. Lymph nodes: No abnormal radiotracer accumulation within pelvic or abdominal nodes. Liver: No evidence of liver metastasis. Incidental CT finding: Atherosclerotic calcification of the aorta. SKELETON Intense radiotracer activity within the LEFT aspect of the sternum along the inferior margin with SUV max equal 37.9 (image 114). Minimal change on the CT portion exam. Second very small lesion within the manubrium with SUV max  equal 6.5 on image 85. Additional lesion in the most medial aspect of the LEFT ninth rib at the costovertebral junction with SUV max equal 8.2 on image 111. IMPRESSION: 1. Three foci of intense radiotracer consistent with prostate cancer skeletal metastasis. Two in the sternum and one in the posteromedial LEFT ninth rib. Minimal change on the CT portion exam. 2. Single focus radiotracer activity along the ventral margin of the descending thoracic aorta. Potential metastatic lymph node. No CT correlation. 3. No focal activity the prostate gland. No pelvic metastatic lymphadenopathy. 4.  Atherosclerotic calcification of the aorta. Electronically Signed   By: Suzy Bouchard M.D.   On: 04/04/2022 09:50

## 2022-05-17 NOTE — Assessment & Plan Note (Signed)
Continue follow up with primary care provider

## 2022-05-17 NOTE — Assessment & Plan Note (Addendum)
Finish palliative RT Consider bone strengthen agent in the future

## 2022-05-18 ENCOUNTER — Encounter: Payer: Self-pay | Admitting: Cardiology

## 2022-05-18 ENCOUNTER — Other Ambulatory Visit: Payer: Self-pay

## 2022-05-18 ENCOUNTER — Ambulatory Visit
Admission: RE | Admit: 2022-05-18 | Discharge: 2022-05-18 | Disposition: A | Discharge status: Home / Self Care | Payer: Medicare (Managed Care) | Source: Ambulatory Visit | Attending: Radiation Oncology | Admitting: Radiation Oncology

## 2022-05-18 DIAGNOSIS — Z510 Encounter for antineoplastic radiation therapy: Secondary | ICD-10-CM | POA: Diagnosis not present

## 2022-05-18 LAB — RAD ONC ARIA SESSION SUMMARY
Course Elapsed Days: 0
Plan Fractions Treated to Date: 1
Plan Prescribed Dose Per Fraction: 3 Gy
Plan Total Fractions Prescribed: 10
Plan Total Prescribed Dose: 30 Gy
Reference Point Dosage Given to Date: 3 Gy
Reference Point Session Dosage Given: 3 Gy
Session Number: 1

## 2022-05-19 ENCOUNTER — Other Ambulatory Visit: Payer: Self-pay

## 2022-05-19 ENCOUNTER — Ambulatory Visit
Admission: RE | Admit: 2022-05-19 | Discharge: 2022-05-19 | Disposition: A | Discharge status: Home / Self Care | Payer: Medicare (Managed Care) | Source: Ambulatory Visit | Attending: Radiation Oncology | Admitting: Radiation Oncology

## 2022-05-19 DIAGNOSIS — Z510 Encounter for antineoplastic radiation therapy: Secondary | ICD-10-CM | POA: Diagnosis not present

## 2022-05-19 LAB — RAD ONC ARIA SESSION SUMMARY
Course Elapsed Days: 1
Plan Fractions Treated to Date: 2
Plan Prescribed Dose Per Fraction: 3 Gy
Plan Total Fractions Prescribed: 10
Plan Total Prescribed Dose: 30 Gy
Reference Point Dosage Given to Date: 6 Gy
Reference Point Session Dosage Given: 3 Gy
Session Number: 2

## 2022-05-20 ENCOUNTER — Ambulatory Visit
Admission: RE | Admit: 2022-05-20 | Discharge: 2022-05-20 | Disposition: A | Discharge status: Home / Self Care | Payer: Medicare (Managed Care) | Source: Ambulatory Visit | Attending: Radiation Oncology | Admitting: Radiation Oncology

## 2022-05-20 ENCOUNTER — Other Ambulatory Visit: Payer: Self-pay

## 2022-05-20 DIAGNOSIS — Z510 Encounter for antineoplastic radiation therapy: Secondary | ICD-10-CM | POA: Diagnosis not present

## 2022-05-20 LAB — RAD ONC ARIA SESSION SUMMARY
Course Elapsed Days: 2
Plan Fractions Treated to Date: 3
Plan Prescribed Dose Per Fraction: 3 Gy
Plan Total Fractions Prescribed: 10
Plan Total Prescribed Dose: 30 Gy
Reference Point Dosage Given to Date: 9 Gy
Reference Point Session Dosage Given: 3 Gy
Session Number: 3

## 2022-05-23 ENCOUNTER — Other Ambulatory Visit: Payer: Self-pay

## 2022-05-23 ENCOUNTER — Ambulatory Visit
Admission: RE | Admit: 2022-05-23 | Discharge: 2022-05-23 | Disposition: A | Discharge status: Home / Self Care | Payer: Medicare (Managed Care) | Source: Ambulatory Visit | Attending: Radiation Oncology | Admitting: Radiation Oncology

## 2022-05-23 DIAGNOSIS — Z510 Encounter for antineoplastic radiation therapy: Secondary | ICD-10-CM | POA: Diagnosis not present

## 2022-05-23 LAB — RAD ONC ARIA SESSION SUMMARY
Course Elapsed Days: 5
Plan Fractions Treated to Date: 4
Plan Prescribed Dose Per Fraction: 3 Gy
Plan Total Fractions Prescribed: 10
Plan Total Prescribed Dose: 30 Gy
Reference Point Dosage Given to Date: 12 Gy
Reference Point Session Dosage Given: 3 Gy
Session Number: 4

## 2022-05-24 ENCOUNTER — Other Ambulatory Visit: Payer: Self-pay

## 2022-05-24 ENCOUNTER — Ambulatory Visit
Admission: RE | Admit: 2022-05-24 | Discharge: 2022-05-24 | Disposition: A | Discharge status: Home / Self Care | Payer: Medicare (Managed Care) | Source: Ambulatory Visit | Attending: Radiation Oncology | Admitting: Radiation Oncology

## 2022-05-24 DIAGNOSIS — Z510 Encounter for antineoplastic radiation therapy: Secondary | ICD-10-CM | POA: Diagnosis not present

## 2022-05-24 LAB — RAD ONC ARIA SESSION SUMMARY
Course Elapsed Days: 6
Plan Fractions Treated to Date: 5
Plan Prescribed Dose Per Fraction: 3 Gy
Plan Total Fractions Prescribed: 10
Plan Total Prescribed Dose: 30 Gy
Reference Point Dosage Given to Date: 15 Gy
Reference Point Session Dosage Given: 3 Gy
Session Number: 5

## 2022-05-25 ENCOUNTER — Ambulatory Visit: Payer: Medicare (Managed Care)

## 2022-05-25 ENCOUNTER — Encounter: Payer: Self-pay | Admitting: Oncology

## 2022-05-26 ENCOUNTER — Ambulatory Visit: Payer: Medicare (Managed Care)

## 2022-05-26 ENCOUNTER — Other Ambulatory Visit: Payer: Self-pay

## 2022-05-26 ENCOUNTER — Ambulatory Visit
Admission: RE | Admit: 2022-05-26 | Discharge: 2022-05-26 | Disposition: A | Discharge status: Home / Self Care | Payer: Medicare (Managed Care) | Source: Ambulatory Visit | Attending: Radiation Oncology | Admitting: Radiation Oncology

## 2022-05-26 DIAGNOSIS — Z510 Encounter for antineoplastic radiation therapy: Secondary | ICD-10-CM | POA: Diagnosis not present

## 2022-05-26 LAB — RAD ONC ARIA SESSION SUMMARY
Course Elapsed Days: 8
Plan Fractions Treated to Date: 6
Plan Prescribed Dose Per Fraction: 3 Gy
Plan Total Fractions Prescribed: 10
Plan Total Prescribed Dose: 30 Gy
Reference Point Dosage Given to Date: 18 Gy
Reference Point Session Dosage Given: 3 Gy
Session Number: 6

## 2022-05-27 ENCOUNTER — Ambulatory Visit
Admission: RE | Admit: 2022-05-27 | Discharge: 2022-05-27 | Disposition: A | Discharge status: Home / Self Care | Payer: Medicare (Managed Care) | Source: Ambulatory Visit | Attending: Radiation Oncology | Admitting: Radiation Oncology

## 2022-05-27 ENCOUNTER — Other Ambulatory Visit: Payer: Self-pay

## 2022-05-27 DIAGNOSIS — Z510 Encounter for antineoplastic radiation therapy: Secondary | ICD-10-CM | POA: Diagnosis not present

## 2022-05-27 LAB — RAD ONC ARIA SESSION SUMMARY
Course Elapsed Days: 9
Plan Fractions Treated to Date: 7
Plan Prescribed Dose Per Fraction: 3 Gy
Plan Total Fractions Prescribed: 10
Plan Total Prescribed Dose: 30 Gy
Reference Point Dosage Given to Date: 21 Gy
Reference Point Session Dosage Given: 3 Gy
Session Number: 7

## 2022-05-30 ENCOUNTER — Other Ambulatory Visit: Payer: Self-pay

## 2022-05-30 ENCOUNTER — Ambulatory Visit
Admission: RE | Admit: 2022-05-30 | Discharge: 2022-05-30 | Disposition: A | Discharge status: Home / Self Care | Payer: Medicare (Managed Care) | Source: Ambulatory Visit | Attending: Radiation Oncology | Admitting: Radiation Oncology

## 2022-05-30 DIAGNOSIS — Z510 Encounter for antineoplastic radiation therapy: Secondary | ICD-10-CM | POA: Diagnosis not present

## 2022-05-30 LAB — RAD ONC ARIA SESSION SUMMARY
Course Elapsed Days: 12
Plan Fractions Treated to Date: 8
Plan Prescribed Dose Per Fraction: 3 Gy
Plan Total Fractions Prescribed: 10
Plan Total Prescribed Dose: 30 Gy
Reference Point Dosage Given to Date: 24 Gy
Reference Point Session Dosage Given: 3 Gy
Session Number: 8

## 2022-05-31 ENCOUNTER — Other Ambulatory Visit: Payer: Self-pay

## 2022-05-31 ENCOUNTER — Ambulatory Visit: Payer: Medicare (Managed Care)

## 2022-05-31 ENCOUNTER — Ambulatory Visit
Admission: RE | Admit: 2022-05-31 | Discharge: 2022-05-31 | Disposition: A | Discharge status: Home / Self Care | Payer: Medicare (Managed Care) | Source: Ambulatory Visit | Attending: Radiation Oncology | Admitting: Radiation Oncology

## 2022-05-31 DIAGNOSIS — Z510 Encounter for antineoplastic radiation therapy: Secondary | ICD-10-CM | POA: Diagnosis not present

## 2022-05-31 LAB — RAD ONC ARIA SESSION SUMMARY
Course Elapsed Days: 13
Plan Fractions Treated to Date: 9
Plan Prescribed Dose Per Fraction: 3 Gy
Plan Total Fractions Prescribed: 10
Plan Total Prescribed Dose: 30 Gy
Reference Point Dosage Given to Date: 27 Gy
Reference Point Session Dosage Given: 3 Gy
Session Number: 9

## 2022-06-01 ENCOUNTER — Ambulatory Visit
Admission: RE | Admit: 2022-06-01 | Discharge: 2022-06-01 | Disposition: A | Discharge status: Home / Self Care | Payer: Medicare (Managed Care) | Source: Ambulatory Visit | Attending: Radiation Oncology | Admitting: Radiation Oncology

## 2022-06-01 ENCOUNTER — Other Ambulatory Visit: Payer: Self-pay

## 2022-06-01 DIAGNOSIS — Z510 Encounter for antineoplastic radiation therapy: Secondary | ICD-10-CM | POA: Diagnosis not present

## 2022-06-01 LAB — RAD ONC ARIA SESSION SUMMARY
Course Elapsed Days: 14
Plan Fractions Treated to Date: 10
Plan Prescribed Dose Per Fraction: 3 Gy
Plan Total Fractions Prescribed: 10
Plan Total Prescribed Dose: 30 Gy
Reference Point Dosage Given to Date: 30 Gy
Reference Point Session Dosage Given: 3 Gy
Session Number: 10

## 2022-06-02 ENCOUNTER — Ambulatory Visit (INDEPENDENT_AMBULATORY_CARE_PROVIDER_SITE_OTHER): Payer: Medicare (Managed Care) | Admitting: Urology

## 2022-06-02 ENCOUNTER — Encounter: Payer: Self-pay | Admitting: Urology

## 2022-06-02 VITALS — BP 115/80 | HR 88 | Ht 69.0 in | Wt 189.0 lb

## 2022-06-02 DIAGNOSIS — C61 Malignant neoplasm of prostate: Secondary | ICD-10-CM | POA: Diagnosis not present

## 2022-06-02 DIAGNOSIS — D494 Neoplasm of unspecified behavior of bladder: Secondary | ICD-10-CM

## 2022-06-02 DIAGNOSIS — Z8551 Personal history of malignant neoplasm of bladder: Secondary | ICD-10-CM

## 2022-06-02 DIAGNOSIS — C7951 Secondary malignant neoplasm of bone: Secondary | ICD-10-CM

## 2022-06-02 LAB — BLADDER SCAN AMB NON-IMAGING

## 2022-06-02 NOTE — Progress Notes (Signed)
   06/02/2022 11:36 AM   Matthew Mcgrath Jun 20, 1945 [id number]  Reason for visit: Follow up metastatic prostate cancer, bladder cancer  HPI: Very comorbid 77 year old male(cardiomyopathy, CHF, diabetes, developmental delay) who originally presented in January 2022 with an elevated PSA of 30 and biopsy showed high risk disease( GS 5+4=9) with evidence of metastatic disease on bone scan/CT.  He has been managed by oncology, most recently found to have castrate resistant disease with rising PSA of 2.86, and PSMA PET scan showing additional skeletal metastasis in the sternum and ninth rib, recently underwent palliative radiation.  Oncology is continuing ADT and darolutamide.  He also developed gross hematuria in April 2023, and cystoscopy showed a 2.5 cm bladder lesion.  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  Surveillance cystoscopy in August and December 2023 were benign and cytology was negative.  Using shared decision making, with his other comorbidities and castrate resistant prostate cancer they would like to space out his surveillance cystoscopy which I think is reasonable.  He continues to void with a good stream and denies any gross hematuria, PVR is normal at 130 mL, and he continues on Flomax.  Will schedule surveillance cystoscopy in the next 1 to 2 months, continue surveillance every 4 to 6 months based on patient/family preference with his other comorbidities and metastatic prostate cancer  Billey Co, MD  [facility] [address], [place], [place] [address] [phone]

## 2022-06-24 ENCOUNTER — Inpatient Hospital Stay: Payer: Medicare (Managed Care) | Attending: Oncology

## 2022-06-24 DIAGNOSIS — Z807 Family history of other malignant neoplasms of lymphoid, hematopoietic and related tissues: Secondary | ICD-10-CM | POA: Insufficient documentation

## 2022-06-24 DIAGNOSIS — C61 Malignant neoplasm of prostate: Secondary | ICD-10-CM | POA: Diagnosis present

## 2022-06-24 DIAGNOSIS — Z87891 Personal history of nicotine dependence: Secondary | ICD-10-CM | POA: Insufficient documentation

## 2022-06-24 DIAGNOSIS — R531 Weakness: Secondary | ICD-10-CM | POA: Diagnosis not present

## 2022-06-24 DIAGNOSIS — D649 Anemia, unspecified: Secondary | ICD-10-CM | POA: Insufficient documentation

## 2022-06-24 DIAGNOSIS — C7951 Secondary malignant neoplasm of bone: Secondary | ICD-10-CM | POA: Insufficient documentation

## 2022-06-24 DIAGNOSIS — Z803 Family history of malignant neoplasm of breast: Secondary | ICD-10-CM | POA: Insufficient documentation

## 2022-06-24 DIAGNOSIS — Z800 Family history of malignant neoplasm of digestive organs: Secondary | ICD-10-CM | POA: Diagnosis not present

## 2022-06-24 DIAGNOSIS — R4189 Other symptoms and signs involving cognitive functions and awareness: Secondary | ICD-10-CM | POA: Insufficient documentation

## 2022-06-24 LAB — CBC WITH DIFFERENTIAL (CANCER CENTER ONLY)
Abs Immature Granulocytes: 0 10*3/uL (ref 0.00–0.07)
Basophils Absolute: 0 10*3/uL (ref 0.0–0.1)
Basophils Relative: 0 %
Eosinophils Absolute: 0 10*3/uL (ref 0.0–0.5)
Eosinophils Relative: 1 %
HCT: 35.5 % — ABNORMAL LOW (ref 39.0–52.0)
Hemoglobin: 11.6 g/dL — ABNORMAL LOW (ref 13.0–17.0)
Immature Granulocytes: 0 %
Lymphocytes Relative: 17 %
Lymphs Abs: 0.5 10*3/uL — ABNORMAL LOW (ref 0.7–4.0)
MCH: 31.1 pg (ref 26.0–34.0)
MCHC: 32.7 g/dL (ref 30.0–36.0)
MCV: 95.2 fL (ref 80.0–100.0)
Monocytes Absolute: 0.4 10*3/uL (ref 0.1–1.0)
Monocytes Relative: 12 %
Neutro Abs: 2.2 10*3/uL (ref 1.7–7.7)
Neutrophils Relative %: 70 %
Platelet Count: 179 10*3/uL (ref 150–400)
RBC: 3.73 MIL/uL — ABNORMAL LOW (ref 4.22–5.81)
RDW: 13.7 % (ref 11.5–15.5)
WBC Count: 3.1 10*3/uL — ABNORMAL LOW (ref 4.0–10.5)
nRBC: 0 % (ref 0.0–0.2)

## 2022-06-24 LAB — CMP (CANCER CENTER ONLY)
ALT: 11 U/L (ref 0–44)
AST: 20 U/L (ref 15–41)
Albumin: 3.7 g/dL (ref 3.5–5.0)
Alkaline Phosphatase: 67 U/L (ref 38–126)
Anion gap: 11 (ref 5–15)
BUN: 15 mg/dL (ref 8–23)
CO2: 21 mmol/L — ABNORMAL LOW (ref 22–32)
Calcium: 9.5 mg/dL (ref 8.9–10.3)
Chloride: 101 mmol/L (ref 98–111)
Creatinine: 0.74 mg/dL (ref 0.61–1.24)
GFR, Estimated: >60 mL/min (ref >60)
Glucose, Bld: 153 mg/dL — ABNORMAL HIGH (ref 70–99)
Potassium: 4 mmol/L (ref 3.5–5.1)
Sodium: 133 mmol/L — ABNORMAL LOW (ref 135–145)
Total Bilirubin: 0.5 mg/dL (ref 0.3–1.2)
Total Protein: 7.1 g/dL (ref 6.5–8.1)

## 2022-06-24 LAB — PSA: Prostatic Specific Antigen: 3.29 ng/mL (ref 0.00–4.00)

## 2022-06-27 ENCOUNTER — Encounter: Payer: Self-pay | Admitting: Radiation Oncology

## 2022-06-27 ENCOUNTER — Ambulatory Visit
Admission: RE | Admit: 2022-06-27 | Discharge: 2022-06-27 | Disposition: A | Discharge status: Home / Self Care | Payer: Medicare (Managed Care) | Source: Ambulatory Visit | Attending: Radiation Oncology | Admitting: Radiation Oncology

## 2022-06-27 VITALS — BP 142/76 | HR 74 | Temp 96.8°F | Resp 16 | Ht 69.0 in

## 2022-06-27 DIAGNOSIS — Z923 Personal history of irradiation: Secondary | ICD-10-CM | POA: Insufficient documentation

## 2022-06-27 DIAGNOSIS — C7951 Secondary malignant neoplasm of bone: Secondary | ICD-10-CM | POA: Diagnosis present

## 2022-06-27 DIAGNOSIS — C61 Malignant neoplasm of prostate: Secondary | ICD-10-CM | POA: Insufficient documentation

## 2022-06-27 NOTE — Progress Notes (Signed)
Radiation Oncology Follow up Note  Name: Matthew Mcgrath   Date:   06/27/2022 MRN:  [id number] DOB: 1945-10-11    This 77 y.o. male presents to the clinic today for 1 month follow-up status post.  Palliative radiation therapy to his sternal lesion as well as progress parasternal lesion and thoracic aortic lymph node in a patient with stage IV prostate cancer  REFERRING PROVIDER: [facility]  HPI: Patient is a 77 year old male now out 1 month having completed palliative radiation therapy to multiple sites for stage IV prostate cancer.Marland Kitchen  He is known to have a Gleason 9 (5+4) adenocarcinoma with a PSA of 30.  He is currently on ADT therapy as well asdarolutamide.Marland Kitchen  He is doing well.  No significant pain at this point in time.  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 Well-developed well-nourished patient in NAD. HEENT reveals PERLA, EOMI, discs not visualized.  Oral cavity is clear. No oral mucosal lesions are identified. Neck is clear without evidence of cervical or supraclavicular adenopathy. Lungs are clear to A&P. Cardiac examination is essentially unremarkable with regular rate and rhythm without murmur rub or thrill. Abdomen is benign with no organomegaly or masses noted. Motor sensory and DTR levels are equal and symmetric in the upper and lower extremities. Cranial nerves II through XII are grossly intact. Proprioception is intact. No peripheral adenopathy or edema is identified. No motor or sensory levels are noted. Crude visual fields are within normal range.  RADIOLOGY RESULTS: No current films for review  PLAN: Present time patient is stable from a palliative standpoint.  Pleased with his overall progress.  I am going to turn follow-up care over to medical oncology where  he continues treatment as well as follow-up with urology.  I would be happy to reevaluate the patient anytime should that be indicated.  Family comprehends my recommendations well.  I would like to take this opportunity to thank you for allowing me to participate in the care of your patient.Carmina Miller, MD

## 2022-06-28 ENCOUNTER — Encounter: Payer: Self-pay | Admitting: Oncology

## 2022-06-28 ENCOUNTER — Inpatient Hospital Stay (HOSPITAL_BASED_OUTPATIENT_CLINIC_OR_DEPARTMENT_OTHER): Payer: Medicare (Managed Care) | Admitting: Oncology

## 2022-06-28 VITALS — BP 133/91 | HR 75 | Temp 97.9°F | Resp 18 | Wt 188.5 lb

## 2022-06-28 DIAGNOSIS — C7951 Secondary malignant neoplasm of bone: Secondary | ICD-10-CM | POA: Diagnosis not present

## 2022-06-28 DIAGNOSIS — Z5111 Encounter for antineoplastic chemotherapy: Secondary | ICD-10-CM

## 2022-06-28 DIAGNOSIS — C61 Malignant neoplasm of prostate: Secondary | ICD-10-CM | POA: Diagnosis not present

## 2022-06-28 DIAGNOSIS — R29898 Other symptoms and signs involving the musculoskeletal system: Secondary | ICD-10-CM | POA: Diagnosis not present

## 2022-06-28 DIAGNOSIS — R4189 Other symptoms and signs involving cognitive functions and awareness: Secondary | ICD-10-CM

## 2022-06-28 DIAGNOSIS — D649 Anemia, unspecified: Secondary | ICD-10-CM

## 2022-06-28 IMAGING — CT CT CHEST-ABD-PELV W/O CM
2 of 4 series · 14 of 36 positions shown, 16 images · non-contrast
Comparison: CT abdomen pelvis, 04/21/2020

CLINICAL DATA: Metastatic prostate cancer restaging

EXAM:
CT CHEST, ABDOMEN AND PELVIS WITHOUT CONTRAST
TECHNIQUE: Multidetector CT imaging of the chest, abdomen and pelvis was
performed following the standard protocol without IV contrast. Oral
enteric contrast was administered.

[Series 2: cap wo st · axial · 0.88mm/px · z∈[-647,-97]mm · 11 of 134 slices shown, 13 images]
[im 12/134  mediastinal]
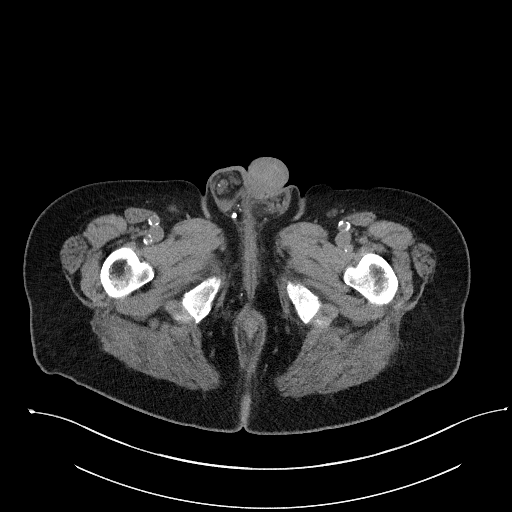
[im 12/134  bone]
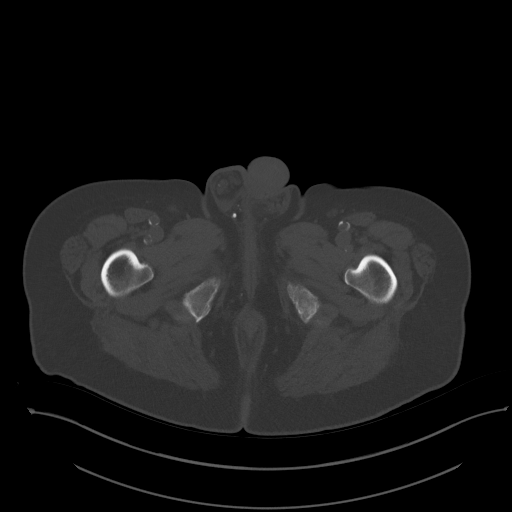
[im 23/134  mediastinal]
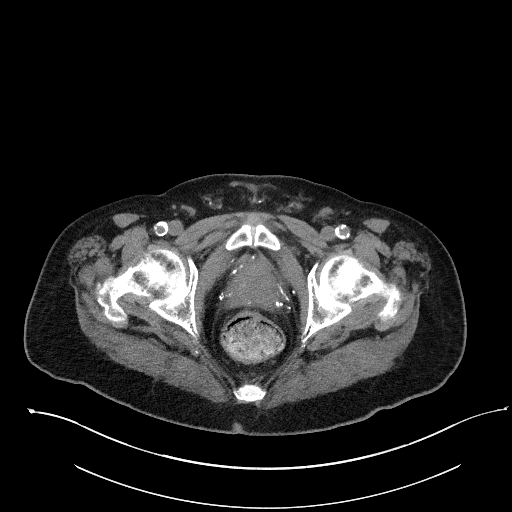
[im 34/134  mediastinal]
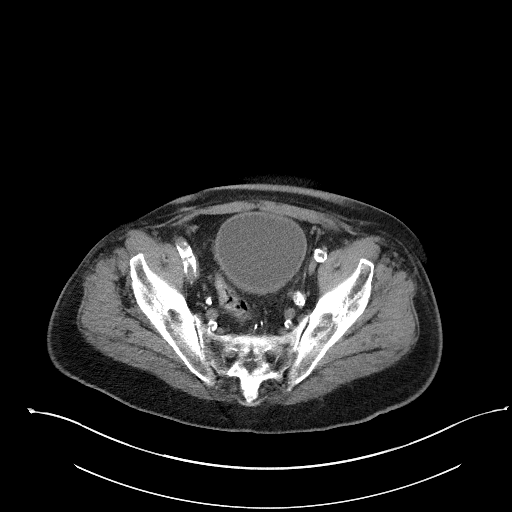
[im 45/134  mediastinal]
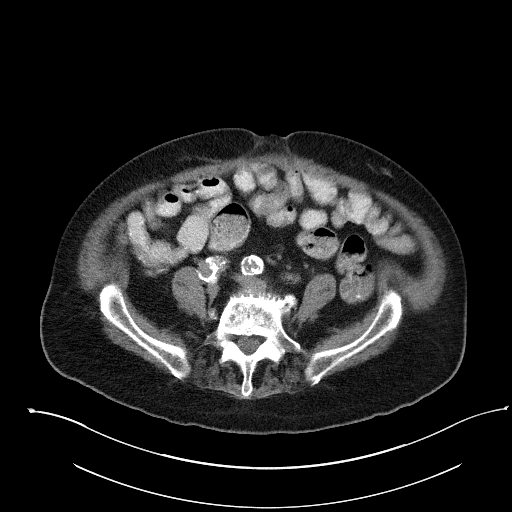
[im 56/134  mediastinal]
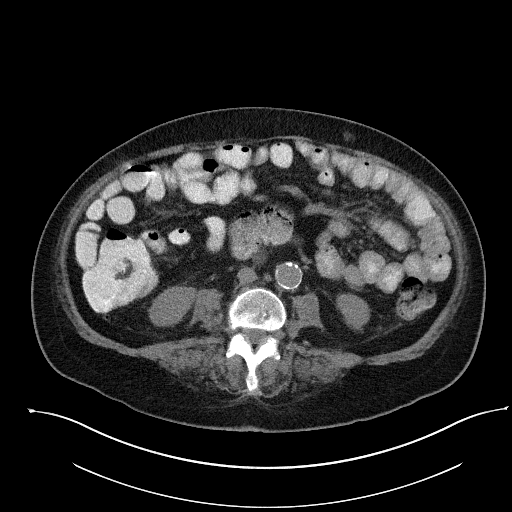
[im 67/134  mediastinal]
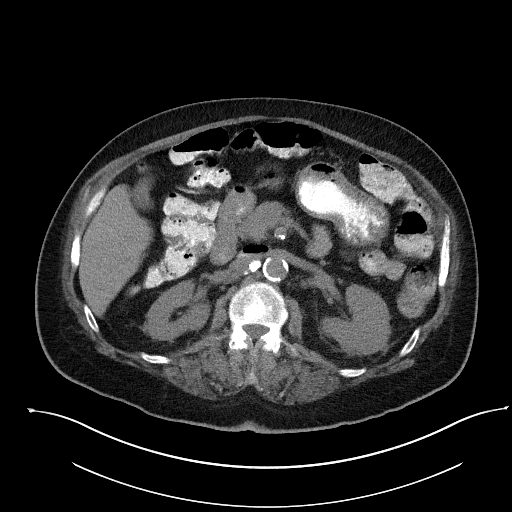
[im 78/134  mediastinal]
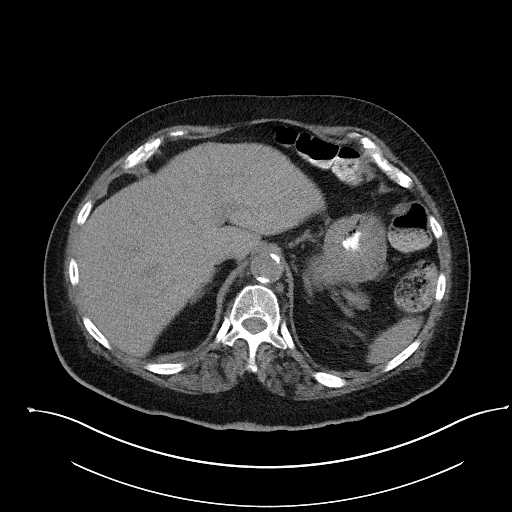
[im 89/134  mediastinal]
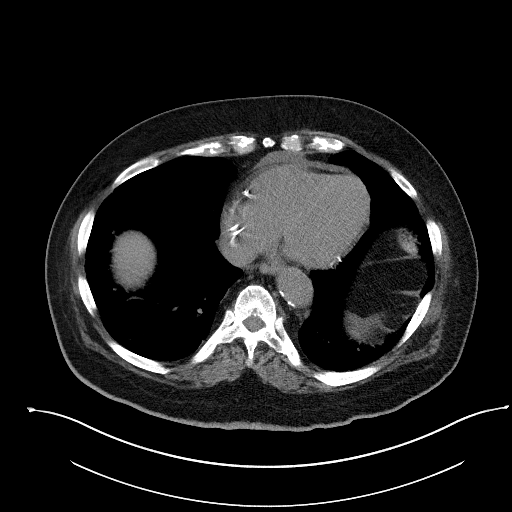
[im 100/134  mediastinal]
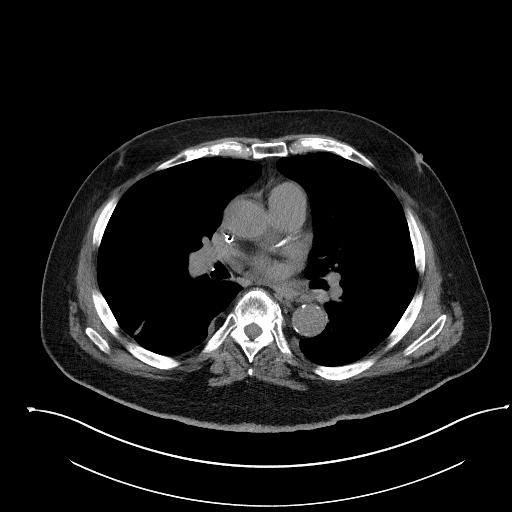
[im 100/134  bone]
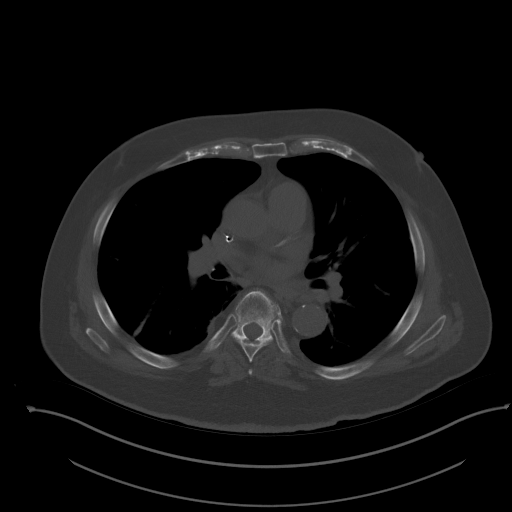
[im 111/134  mediastinal]
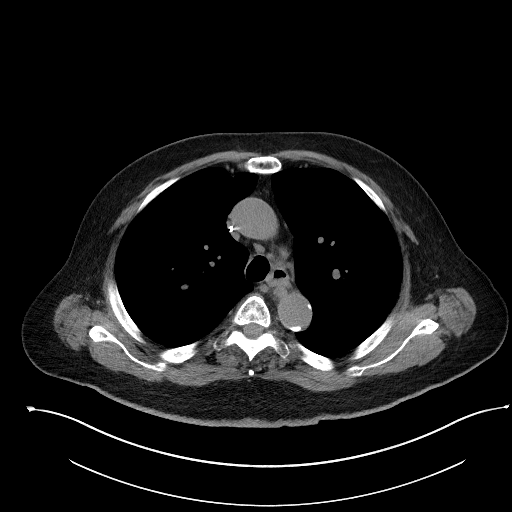
[im 122/134  mediastinal]
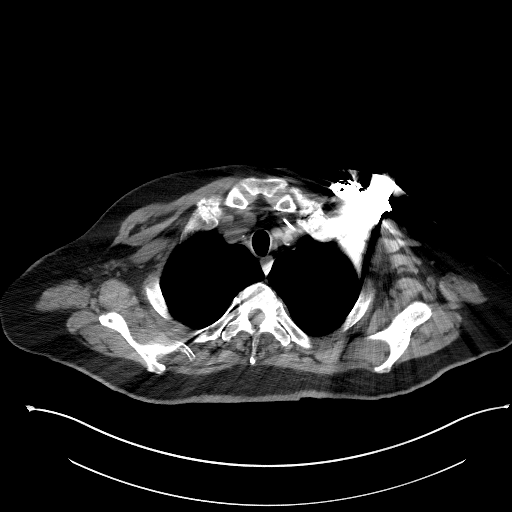

[Series 5: coronal · coronal · 0.90mm/px · 3 of 152 slices shown]
[im 31/152  mediastinal]
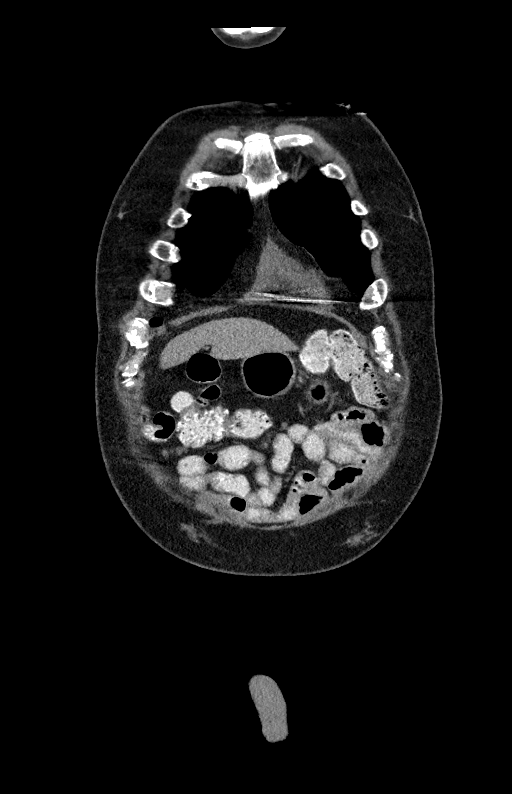
[im 61/152  mediastinal]
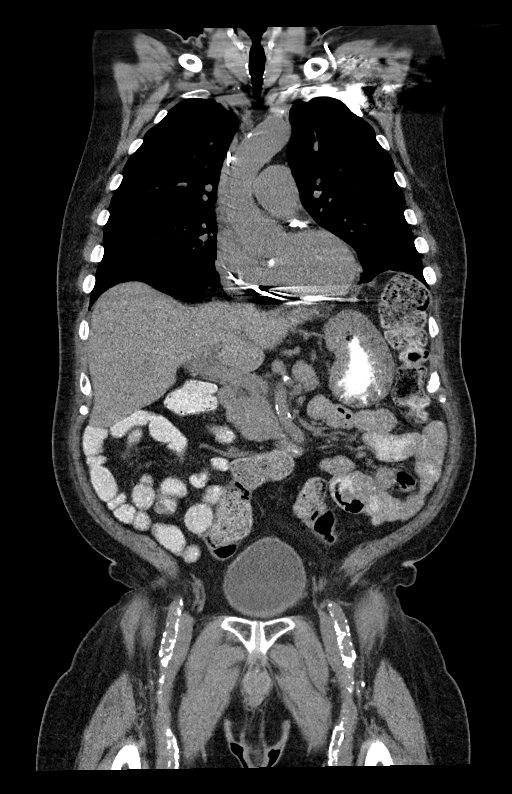
[im 91/152  mediastinal]
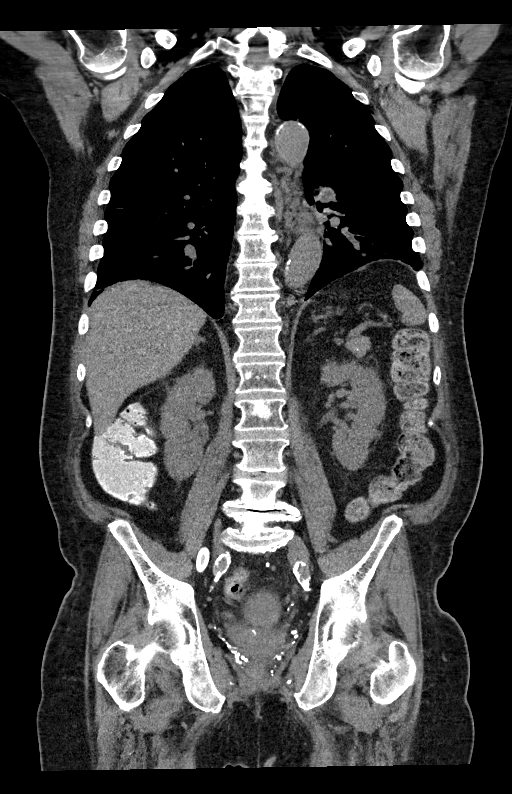

[14 of 36 positions shown; findings below may reference images not displayed]

FINDINGS: CT CHEST FINDINGS

Cardiovascular: Left chest single lead pacer defibrillator. Normal
heart size. Extensive three-vessel coronary artery calcifications
and stents. Small, unchanged pericardial effusion. Enlargement of
the main pulmonary artery measuring up to 3.9 cm in caliber.

Mediastinum/Nodes: No enlarged mediastinal, hilar, or axillary lymph
nodes. Thyroid gland, trachea, and esophagus demonstrate no
significant findings.

Lungs/Pleura: Bland appearing, predominantly bandlike scarring of
the bilateral lung bases. No pleural effusion or pneumothorax.

Musculoskeletal: No chest wall mass. Focally severe disc
degenerative disease versus osseous metastatic involvement of T6-T7
(series 6, image 104).

CT ABDOMEN PELVIS FINDINGS

Hepatobiliary: No solid liver abnormality is seen. No gallstones,
gallbladder wall thickening, or biliary dilatation.

Pancreas: Unremarkable. No pancreatic ductal dilatation or
surrounding inflammatory changes.

Spleen: Normal in size without significant abnormality.

Adrenals/Urinary Tract: Adrenal glands are unremarkable. Kidneys are
normal, without renal calculi, solid lesion, or hydronephrosis.
Thickening of the urinary bladder wall.

Stomach/Bowel: Stomach is within normal limits. Incidental
diverticulum of the transverse portion of the duodenum. Appendix
appears normal. No evidence of bowel wall thickening, distention, or
inflammatory changes.

Vascular/Lymphatic: Aortic atherosclerosis. No enlarged abdominal or
pelvic lymph nodes.

Reproductive: Mild prostatomegaly.

Other: No abdominal wall hernia or abnormality. No abdominopelvic
ascites.

Musculoskeletal: Slight interval increase in sclerosis of osseous
metastases involving the lumbar spine, for example the L2 vertebral
body (series 6, image 107).
IMPRESSION: 1. Slight interval increase in sclerosis of previously imaged
osseous metastases involving the lumbar spine, most consistent with
post treatment sclerosis of osseous metastatic disease.
2. Focally severe disc degenerative disease and sclerosis versus
sclerotic osseous metastatic involvement of T6-T7, not previously
imaged by CT. Attention on follow-up.
3. No noncontrast evidence of lymphadenopathy or soft tissue
metastatic disease in the chest, abdomen, or pelvis.
4. Mild prostatomegaly. Thickening of the urinary bladder wall,
likely due to chronic outlet obstruction.
5. Enlargement of the main pulmonary artery, as can be seen in
pulmonary hypertension.
6. Coronary artery disease.

Aortic Atherosclerosis ([id number]).

## 2022-06-28 NOTE — Assessment & Plan Note (Addendum)
#  Metastatic prostate cancer to bone, castration sensitive Eligard 45 mg Q6 months with his PCP at [facility] given in November 2022. Recommend him to receive Eligard 45 mg every 6 months via [facility] Labs reviewed and discussed with patient.   PSA nadir 0.01. PSA continues to increase to 3.29 he has developed castration resistance disease. PMSA showed new bone lesions, s/p palliative radiation.   #NGS showed BRCA 2 S1404, BRIP1 R173C 3X, FOXO1 1S382fs. 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  I recommend Olarparib  BID [ dose reduced due to performance status]. Rationale and side effects were discussed with patient. Stop Darolutamide.  I have also discussed with his [facility] provider Dr. Arta Silence. Patient will get medication through [facility].

## 2022-06-28 NOTE — Assessment & Plan Note (Addendum)
S/p palliative RT Consider bone strengthen agent in the future

## 2022-06-28 NOTE — Progress Notes (Signed)
Pt here for follow up. Sister with him today and she states that pt has had increased weakness

## 2022-06-28 NOTE — Assessment & Plan Note (Signed)
Continue follow up with primary care provider 

## 2022-06-28 NOTE — Assessment & Plan Note (Signed)
Stable hemoglobin. Monitor  

## 2022-06-28 NOTE — Progress Notes (Signed)
Hematology/Oncology Progress note [phone] [phone]     CHIEF COMPLAINTS/REASON FOR VISIT:  metastatic prostate cancer   ASSESSMENT & PLAN:   Cancer Staging  Prostate cancer Staging form: Prostate, AJCC 8th Edition - Clinical stage from 05/11/2020: Stage IVB (cTX, cN0, cM1, PSA: 30) - Signed by [name], MD on 05/11/2020   Prostate cancer [facility]) #Metastatic prostate cancer to bone, castration sensitive Eligard 45 mg Q6 months with his PCP at [facility] given in November 2022. Recommend him to receive Eligard 45 mg every 6 months via [facility] Labs reviewed and discussed with patient.   PSA nadir 0.01. PSA continues to increase to 3.29 he has developed castration resistance disease. PMSA showed new bone lesions, s/p palliative radiation.   #NGS showed BRCA 2 S1404, BRIP1 R173C 3X, FOXO1 1S344fs. 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  I recommend Olarparib  BID [ dose reduced due to performance status]. Rationale and side effects were discussed with patient. Stop Darolutamide.  I have also discussed with his [facility] provider Dr. Arta Silence. Patient will get medication through [facility].   Metastasis to bone [facility]) S/p palliative RT Consider bone strengthen agent in the future  Encounter for antineoplastic chemotherapy Treatment plan as listed above  Anemia Stable hemoglobin.  Monitor.   Cognitive impairment Continue follow up with primary care provider  Lower extremity weakness ? Deconditioning vs CVA.  Recommend MRI brain w wo contrast   Orders Placed This Encounter  Procedures   MR Brain W Wo Contrast    Standing Status:   Future    Standing Expiration Date:   06/28/2023    Order Specific Question:   If indicated for the ordered procedure, I authorize the administration of contrast media per Radiology protocol    Answer:   Yes    Order Specific Question:   What is the patient's sedation requirement?    Answer:    No Sedation    Order Specific Question:   Does the patient have a pacemaker or implanted devices?    Answer:   No    Order Specific Question:   Use SRS Protocol?    Answer:   Yes    Order Specific Question:   Preferred imaging location?    Answer:   [facility] (table limit - 550lbs)   CBC with Differential (Cancer Center Only)    Standing Status:   Future    Standing Expiration Date:   06/28/2023   CMP (Cancer Center only)    Standing Status:   Future    Standing Expiration Date:   06/28/2023   PSA    Standing Status:   Future    Standing Expiration Date:   06/28/2023   Follow up  I plan to see him 2-3 weeks after started on Olaparib. .  All questions were answered. The patient knows to call the clinic with any problems, questions or concerns.  [name], MD, PhD [facility] Hematology Oncology 06/28/2022    HISTORY OF PRESENTING ILLNESS:   Matthew Mcgrath is a  77 y.o.  male presents for metastatic prostate cancer. Oncology History  Prostate cancer  04/16/2020 Procedure   Firmagon 240 mg loading dose   04/21/2020 Imaging   CT abdomen pelvis showed a sclerotic lesion in the lumbar spine concerning for probable metastatic disease.  No definitive extra skeletal metastatic disease noted elsewhere in the abdomen or pelvis.  Mild circumferential bladder wall thickening.  Aortic atherosclerosis.  05/11/2020 Initial Diagnosis   Prostate cancer   04/01/2020, prostate biopsy showed  All cores were positive for high risk prostate cancer, primarily Gleason score 5+4 = 9, with max core involvement of 100% and perineural invasion present.   05/11/2020 Cancer Staging   Staging form: Prostate, AJCC 8th Edition - Clinical stage from 05/11/2020: Stage IVB (cTX, cN0, cM1, PSA: 30) - Signed by [name], MD on 05/11/2020 Prostate specific antigen (PSA) range: 20 or greater   10/19/2020 -  Radiation Therapy   finished definitive RT. to prostate cancer and pelvic lymph node treatments.   02/13/2021  Imaging   CT chest abdomen pelvis without contrast showed slight interval increase in sclerosis of previously osseous metastasis involving lumbar spine, most consistent with posttreatment sclerosis.  Focally severe disc degeneration and sclerosis versus sclerotic osseous metastatic of T6 and 7.  Attention on follow-up.  No noncontrast evidence of lymphadenopathy or soft tissue metastatic disease in chest abdomen pelvis.  Prostamegaly.  Enlargement of main pulmonary artery.  Coronary artery disease.   02/21/2021 Genetic Testing   [employer] Multi-Cancer+RNA cancer panel found a single, pathogenic variant in BRCA2 called [id number]. The remainder of testing was negative/normal.      03/11/2021 Imaging   , bone scan showed improved/decreased uptake in T6 and L2   04/27/2021 Imaging   Bone scan bone scan showed foci of abnormal uptake noted in the mid thoracic and upper lumbar spine concerning for metastatic disease   12/07/2021 Imaging   CT chest abdomen pelvis wo contrast 1. Mild prostatomegaly without discrete mass appreciated by CT. 2. Unchanged sclerotic osseous metastatic disease, for example of the L1 and L2 vertebral bodies. 3. Unchanged, focally severe disc degenerative disease at T6-T7 with sclerosis of the vertebral bodies, most likely degenerative change although underlying osseous metastasis again not excluded. Continued attention on follow-up. 4. No noncontrast evidence of lymphadenopathy or soft tissue metastatic disease to the chest, abdomen, or pelvis. 5. Enlargement of the main pulmonary artery, as can be seen in pulmonary hypertension. 6. Coronary artery disease.   12/07/2021 Imaging   Bone scan showed 1. There is faint activity in the upper lumbar region, approximate L2 level, likely corresponds to faint sclerosis/metastasis on CT 2. New focus of activity at the left inferior sternum and right sixth rib anteriorly, possibly metastatic but no definitive corresponding lesion on  CT. 3. New prominent activity at the left first rib end/sternoclavicular junction potentially degenerative, given appearance on CT. 4. Probable degenerative uptake at the bilateral feet, shoulders,and midthoracic spine   #06/11/2020 patient went to [facility] for second opinion was seen by [name] who agrees with continuing ADT with switch to Eligard 45 mg.  He is due to proceed with the treatments today.  Communicated with [facility] program [name], patient will [facility] at [facility]. [name] also recommends to start darolutamide and authorization process has been initiated at [facility]. #07/06/2020 started darolutamide 600 mg twice daily # definitive treatment for prostate and pelvic node- Discussed with [facility] [name] and I appreciate his expert opinion of darolutamide concurrent use with RT and ADT  INTERVAL HISTORY MIGEL HANNIS is a 77 y.o. male who has above history reviewed by me today presents for follow up visit for management of metastatic prostate cancer Patient was accompanied by his brother.  He is currently on Darolutamide 600 mg twice daily He denies any new bone pain.  Chronic back pain unchanged. Accompanied by sister  + lower extremity weakness, decreased mobility.   Review of Systems  Constitutional:  Positive for fatigue. Negative for appetite change, chills, fever and unexpected weight change.  HENT:   Negative for hearing loss and voice change.   Eyes:  Negative for eye problems and icterus.  Respiratory:  Negative for chest tightness, cough and shortness of breath.   Cardiovascular:  Negative for chest pain and leg swelling.  Gastrointestinal:  Negative for abdominal distention and abdominal pain.  Endocrine: Negative for hot flashes.  Genitourinary:  Negative for difficulty urinating, dysuria and frequency.   Musculoskeletal:  Positive for back pain. Negative for arthralgias.  Skin:  Negative for itching and rash.  Neurological:  Positive for extremity weakness. Negative for  light-headedness and numbness.  Hematological:  Negative for adenopathy. Does not bruise/bleed easily.  Psychiatric/Behavioral:  Positive for confusion.     MEDICAL HISTORY:  Past Medical History:  Diagnosis Date   Cardiomyopathy    Congestive heart failure    Diabetes mellitus type 2, controlled    Elevated lipids    Family history of breast cancer    Family history of colon cancer    HTN (hypertension)     SURGICAL HISTORY: Past Surgical History:  Procedure Laterality Date   CARDIAC CATHETERIZATION N/A 04/14/2015   Procedure: Left Heart Cath and Coronary Angiography;  Surgeon: Marcina Millard, MD;  Location: [facility] INVASIVE CV LAB;  Service: Cardiovascular;  Laterality: N/A;   CATARACT EXTRACTION W/ INTRAOCULAR LENS  IMPLANT, BILATERAL Left    COLONOSCOPY WITH PROPOFOL N/A 04/08/2020   Procedure: COLONOSCOPY WITH PROPOFOL;  Surgeon: Toledo, Boykin Nearing, MD;  Location: [facility] ENDOSCOPY;  Service: Gastroenterology;  Laterality: N/A;   CYSTOSCOPY W/ RETROGRADES Bilateral 07/02/2021   Procedure: CYSTOSCOPY WITH RETROGRADE PYELOGRAM;  Surgeon: Sondra Come, MD;  Location: [facility] ORS;  Service: Urology;  Laterality: Bilateral;   HERNIA REPAIR     IMPLANTABLE CARDIOVERTER DEFIBRILLATOR (ICD) GENERATOR CHANGE Left 07/03/2015   Procedure: ICD IMPLANT single chamber;  Surgeon: Sharion Settler, MD;  Location: [facility] ORS;  Service: Cardiovascular;  Laterality: Left;   TRANSURETHRAL RESECTION OF BLADDER TUMOR N/A 07/02/2021   Procedure: TRANSURETHRAL RESECTION OF BLADDER TUMOR (TURBT);  Surgeon: Sondra Come, MD;  Location: [facility] ORS;  Service: Urology;  Laterality: N/A;    SOCIAL HISTORY: Social History   Socioeconomic History   Marital status: Single    Spouse name: Not on file   Number of children: Not on file   Years of education: Not on file   Highest education level: Not on file  Occupational History   Not on file  Tobacco Use   Smoking status: Former    Packs/day: 0.25    Years:  30.00    Additional pack years: 0.00    Total pack years: 7.50    Types: Cigarettes    Quit date: 02/11/2020    Years since quitting: 2.3   Smokeless tobacco: Never  Vaping Use   Vaping Use: Never used  Substance and Sexual Activity   Alcohol use: No   Drug use: Not Currently    Frequency: 2.0 times per week    Types: Marijuana    Comment: past use   Sexual activity: Not on file  Other Topics Concern   Not on file  Social History Narrative   Not on file   Social Determinants of Health   Financial Resource Strain: Not on file  Food Insecurity: Not on file  Transportation Needs: Not on file  Physical Activity: Not on file  Stress: Not on file  Social Connections: Not  on file  Intimate Partner Violence: Not on file    FAMILY HISTORY: Family History  Problem Relation Age of Onset   Heart failure Mother    Heart disease Mother    Alcoholism Father    Hypertension Sister    Diabetes Sister    Hypertension Brother    Diabetes Brother    Colon cancer Brother        vs prostate   Multiple myeloma Brother    Breast cancer Cousin     ALLERGIES:  has No Known Allergies.  MEDICATIONS:  Current Outpatient Medications  Medication Sig Dispense Refill   acetaminophen (TYLENOL) 650 MG CR tablet SMARTSIG:2 Tablet(s) By Mouth Twice Daily     ASPIRIN LOW DOSE 81 MG tablet Take 81 mg by mouth daily.     carvedilol (COREG) 3.125 MG tablet Take 3.125 mg by mouth 2 (two) times daily.     cholecalciferol (VITAMIN D3) 25 MCG (1000 UNIT) tablet Take 1,000 Units by mouth daily.     darolutamide (NUBEQA) 300 MG tablet Take 600 mg by mouth 2 (two) times daily with a meal.     glucose blood (TRUE METRIX BLOOD GLUCOSE TEST) test strip 1 each by Other route as needed for other. Use as instructed     GLUMETZA 1000 MG 24 hr tablet Take 1,000 mg by mouth daily.     JARDIANCE 10 MG TABS tablet Take 10 mg by mouth daily.     LEUPROLIDE ACETATE, 6 MONTH, 45 MG injection Inject into the skin.      LIDODERM 5 % Apply 1 patch to skin every morning as needed for L lower back pain. Remove after 12 hours. Call for refills     lisinopril (ZESTRIL) 2.5 MG tablet Take 2.5 mg by mouth daily.     magnesium oxide (MAG-OX) 400 (240 Mg) MG tablet Take 1 tablet by mouth 2 (two) times daily.     metFORMIN (GLUCOPHAGE-XR) 500 MG 24 hr tablet Take 500 mg by mouth every evening.     Multiple Vitamin (ONE-DAILY MULTI-VITAMIN) TABS Take 1 tablet by mouth daily.     Omega-3 Fatty Acids (OMEGA III EPA+DHA) 1000 MG CAPS Take 1 capsule by mouth 2 (two) times daily.     spironolactone (ALDACTONE) 25 MG tablet Take 0.5 tablets (12.5 mg total) by mouth daily.     tamsulosin (FLOMAX) 0.4 MG CAPS capsule Take 1 capsule (0.4 mg total) by mouth daily. 30 capsule 2   TRUEplus Lancets 28G MISC by Does not apply route.     No current facility-administered medications for this visit.     PHYSICAL EXAMINATION: ECOG PERFORMANCE STATUS: 1 - Symptomatic but completely ambulatory Vitals:   06/28/22 1046  BP: (!) 133/91  Pulse: 75  Resp: 18  Temp: 97.9 F (36.6 C)   Filed Weights   06/28/22 1046  Weight: 188 lb 8 oz (85.5 kg)    Physical Exam Constitutional:      General: He is not in acute distress. HENT:     Head: Normocephalic.  Eyes:     General: No scleral icterus. Cardiovascular:     Rate and Rhythm: Normal rate.  Pulmonary:     Effort: Pulmonary effort is normal. No respiratory distress.  Abdominal:     General: Bowel sounds are normal. There is no distension.     Palpations: Abdomen is soft.  Musculoskeletal:        General: No swelling.     Cervical back: Normal range of  motion and neck supple.  Skin:    General: Skin is warm.     Findings: No rash.  Neurological:     Mental Status: He is alert. Mental status is at baseline.     Cranial Nerves: No cranial nerve deficit.     Comments: Oriented x 1  Psychiatric:        Mood and Affect: Mood normal.     LABORATORY DATA:  I have  reviewed the data as listed    Latest Ref Rng & Units 06/24/2022   11:02 AM 05/13/2022   10:27 AM 03/11/2022   10:06 AM  CBC  WBC 4.0 - 10.5 K/uL 3.1  3.7  3.9   Hemoglobin 13.0 - 17.0 g/dL 08.6  57.8  46.9   Hematocrit 39.0 - 52.0 % 35.5  34.4  35.0   Platelets 150 - 400 K/uL 179  212  211       Latest Ref Rng & Units 06/24/2022   11:02 AM 05/13/2022   10:27 AM 03/11/2022   10:06 AM  CMP  Glucose 70 - 99 mg/dL 629  528  413   BUN 8 - 23 mg/dL 15  17  22    Creatinine 0.61 - 1.24 mg/dL 2.44  0.10  2.72   Sodium 135 - 145 mmol/L 133  139  136   Potassium 3.5 - 5.1 mmol/L 4.0  4.1  4.0   Chloride 98 - 111 mmol/L 101  107  101   CO2 22 - 32 mmol/L 21  23  24    Calcium 8.9 - 10.3 mg/dL 9.5  9.5  9.3   Total Protein 6.5 - 8.1 g/dL 7.1  7.5  7.5   Total Bilirubin 0.3 - 1.2 mg/dL 0.5  0.5  0.6   Alkaline Phos 38 - 126 U/L 67  56  66   AST 15 - 41 U/L 20  19  16    ALT 0 - 44 U/L 11  13  12      Iron/TIBC/Ferritin/ %Sat    Component Value Date/Time   IRON 82 11/23/2020 0953   TIBC 335 11/23/2020 0953   FERRITIN 221 11/23/2020 0953   IRONPCTSAT 25 11/23/2020 0953     RADIOGRAPHIC STUDIES: I have personally reviewed the radiological images as listed and agreed with the findings in the report. NM PET (PSMA) SKULL TO MID THIGH  Result Date: 04/04/2022 CLINICAL DATA:  Prostate cancer with bone metastasis. EXAM: NUCLEAR MEDICINE PET SKULL BASE TO THIGH TECHNIQUE: 9.9 mCi F18 Piflufolastat (Pylarify) was injected intravenously. Full-ring PET imaging was performed from the skull base to thigh after the radiotracer. CT data was obtained and used for attenuation correction and anatomic localization. COMPARISON:  Bone scan 12/07/2021 CT scan 12/07/2021 FINDINGS: NECK No radiotracer activity in neck lymph nodes. Incidental CT finding: None. CHEST Small focus radiotracer activity along the ventral border of the descending thoracic aorta with SUV max equal 6.9 on image 112. There is no clear correlating  lesion on the CT portion exam. No additional radiotracer avid mediastinal lymph nodes. No supraclavicular nodes. Incidental CT finding: No suspicious pulmonary nodules. ABDOMEN/PELVIS Prostate: No focal activity within the prostate gland. Lymph nodes: No abnormal radiotracer accumulation within pelvic or abdominal nodes. Liver: No evidence of liver metastasis. Incidental CT finding: Atherosclerotic calcification of the aorta. SKELETON Intense radiotracer activity within the LEFT aspect of the sternum along the inferior margin with SUV max equal 37.9 (image 114). Minimal change on the CT portion exam. Second very small  lesion within the manubrium with SUV max equal 6.5 on image 85. Additional lesion in the most medial aspect of the LEFT ninth rib at the costovertebral junction with SUV max equal 8.2 on image 111. IMPRESSION: 1. Three foci of intense radiotracer consistent with prostate cancer skeletal metastasis. Two in the sternum and one in the posteromedial LEFT ninth rib. Minimal change on the CT portion exam. 2. Single focus radiotracer activity along the ventral margin of the descending thoracic aorta. Potential metastatic lymph node. No CT correlation. 3. No focal activity the prostate gland. No pelvic metastatic lymphadenopathy. 4.  Atherosclerotic calcification of the aorta. Electronically Signed   By: Genevive Bi M.D.   On: 04/04/2022 09:50

## 2022-06-28 NOTE — Assessment & Plan Note (Signed)
?   Deconditioning vs CVA.  Recommend MRI brain w wo contrast

## 2022-06-28 NOTE — Assessment & Plan Note (Signed)
Treatment plan as listed above. 

## 2022-07-01 ENCOUNTER — Ambulatory Visit (HOSPITAL_COMMUNITY)
Admission: RE | Admit: 2022-07-01 | Discharge: 2022-07-01 | Disposition: A | Discharge status: Home / Self Care | Payer: Medicare (Managed Care) | Source: Ambulatory Visit | Attending: Oncology | Admitting: Oncology

## 2022-07-01 DIAGNOSIS — R29898 Other symptoms and signs involving the musculoskeletal system: Secondary | ICD-10-CM | POA: Diagnosis not present

## 2022-07-01 MED ORDER — GADOBUTROL 1 MMOL/ML IV SOLN
8.0000 mL | Freq: Once | INTRAVENOUS | Status: AC | PRN
  Administered 2022-07-01: 8 mL via INTRAVENOUS

## 2022-07-01 NOTE — Progress Notes (Signed)
Per order, Changed device setting to OVO MRI safe mode.   Tachy therapies off if applicable.   Will program device to regular settings after MRI and send post transmission.

## 2022-07-18 ENCOUNTER — Inpatient Hospital Stay: Payer: Medicare (Managed Care) | Attending: Oncology

## 2022-07-18 DIAGNOSIS — C61 Malignant neoplasm of prostate: Secondary | ICD-10-CM | POA: Diagnosis present

## 2022-07-18 DIAGNOSIS — C7951 Secondary malignant neoplasm of bone: Secondary | ICD-10-CM | POA: Insufficient documentation

## 2022-07-18 DIAGNOSIS — D649 Anemia, unspecified: Secondary | ICD-10-CM | POA: Insufficient documentation

## 2022-07-18 DIAGNOSIS — Z807 Family history of other malignant neoplasms of lymphoid, hematopoietic and related tissues: Secondary | ICD-10-CM | POA: Insufficient documentation

## 2022-07-18 DIAGNOSIS — Z800 Family history of malignant neoplasm of digestive organs: Secondary | ICD-10-CM | POA: Diagnosis not present

## 2022-07-18 DIAGNOSIS — Z803 Family history of malignant neoplasm of breast: Secondary | ICD-10-CM | POA: Diagnosis not present

## 2022-07-18 DIAGNOSIS — Z87891 Personal history of nicotine dependence: Secondary | ICD-10-CM | POA: Diagnosis not present

## 2022-07-18 LAB — CBC WITH DIFFERENTIAL (CANCER CENTER ONLY)
Abs Immature Granulocytes: 0.01 10*3/uL (ref 0.00–0.07)
Basophils Absolute: 0 10*3/uL (ref 0.0–0.1)
Basophils Relative: 1 %
Eosinophils Absolute: 0.1 10*3/uL (ref 0.0–0.5)
Eosinophils Relative: 1 %
HCT: 34.9 % — ABNORMAL LOW (ref 39.0–52.0)
Hemoglobin: 10.7 g/dL — ABNORMAL LOW (ref 13.0–17.0)
Immature Granulocytes: 0 %
Lymphocytes Relative: 12 %
Lymphs Abs: 0.4 10*3/uL — ABNORMAL LOW (ref 0.7–4.0)
MCH: 30.9 pg (ref 26.0–34.0)
MCHC: 30.7 g/dL (ref 30.0–36.0)
MCV: 100.9 fL — ABNORMAL HIGH (ref 80.0–100.0)
Monocytes Absolute: 0.3 10*3/uL (ref 0.1–1.0)
Monocytes Relative: 8 %
Neutro Abs: 2.7 10*3/uL (ref 1.7–7.7)
Neutrophils Relative %: 78 %
Platelet Count: 190 10*3/uL (ref 150–400)
RBC: 3.46 MIL/uL — ABNORMAL LOW (ref 4.22–5.81)
RDW: 15.3 % (ref 11.5–15.5)
WBC Count: 3.5 10*3/uL — ABNORMAL LOW (ref 4.0–10.5)
nRBC: 0.6 % — ABNORMAL HIGH (ref 0.0–0.2)

## 2022-07-18 LAB — CMP (CANCER CENTER ONLY)
ALT: 12 U/L (ref 0–44)
AST: 20 U/L (ref 15–41)
Albumin: 3.8 g/dL (ref 3.5–5.0)
Alkaline Phosphatase: 87 U/L (ref 38–126)
Anion gap: 11 (ref 5–15)
BUN: 32 mg/dL — ABNORMAL HIGH (ref 8–23)
CO2: 23 mmol/L (ref 22–32)
Calcium: 9.6 mg/dL (ref 8.9–10.3)
Chloride: 104 mmol/L (ref 98–111)
Creatinine: 0.91 mg/dL (ref 0.61–1.24)
GFR, Estimated: >60 mL/min (ref >60)
Glucose, Bld: 277 mg/dL — ABNORMAL HIGH (ref 70–99)
Potassium: 4.1 mmol/L (ref 3.5–5.1)
Sodium: 138 mmol/L (ref 135–145)
Total Bilirubin: 0.6 mg/dL (ref 0.3–1.2)
Total Protein: 7.4 g/dL (ref 6.5–8.1)

## 2022-07-18 LAB — PSA: Prostatic Specific Antigen: 4.72 ng/mL — ABNORMAL HIGH (ref 0.00–4.00)

## 2022-07-19 ENCOUNTER — Inpatient Hospital Stay (HOSPITAL_BASED_OUTPATIENT_CLINIC_OR_DEPARTMENT_OTHER): Payer: Medicare (Managed Care) | Admitting: Oncology

## 2022-07-19 ENCOUNTER — Encounter: Payer: Self-pay | Admitting: Oncology

## 2022-07-19 VITALS — BP 137/93 | HR 88 | Temp 97.8°F | Resp 18 | Wt 191.0 lb

## 2022-07-19 DIAGNOSIS — D649 Anemia, unspecified: Secondary | ICD-10-CM

## 2022-07-19 DIAGNOSIS — C7951 Secondary malignant neoplasm of bone: Secondary | ICD-10-CM | POA: Diagnosis not present

## 2022-07-19 DIAGNOSIS — C61 Malignant neoplasm of prostate: Secondary | ICD-10-CM

## 2022-07-19 DIAGNOSIS — Z5111 Encounter for antineoplastic chemotherapy: Secondary | ICD-10-CM | POA: Diagnosis not present

## 2022-07-19 NOTE — Progress Notes (Signed)
Hematology/Oncology Progress note [phone] [phone]     CHIEF COMPLAINTS/REASON FOR VISIT:  metastatic prostate cancer   ASSESSMENT & PLAN:   Cancer Staging  Prostate cancer [facility]) Staging form: Prostate, AJCC 8th Edition - Clinical stage from 05/11/2020: Stage IVB (cTX, cN0, cM1, PSA: 30) - Signed by [name], MD on 05/11/2020   Prostate cancer [facility]) #Metastatic prostate cancer to bone, castration resistance disease. #NGS showed BRCA 2 S1404, BRIP1 R173C 3X, FOXO1 1S380fs. TMB 3.1 mut/Mb, MS stable, PD-L1 1%  Genetic testing is positive for BRCA2  Eligard 45 mg Q6 months with his PCP at [facility] given in November 2022. Labs reviewed and discussed with patient.   PSA nadir 0.01. PSA continues to increase to 3.29 PMSA showed new bone lesions, s/p palliative radiation.  Labs are reviewed and discussed with patient. Continue Olarparib 250mg  BID [ dose reduced due to performance status].  Consider to combine with ARPI, ie abiraterone.  I have also discussed with his [facility] Dr. Arta Silence. Patient will get medication through [facility].   Metastasis to bone [facility]) S/p palliative RT Consider bone strengthen agent in the future New C3 lesion on MRI. He is asymptomatic. Recommend observation.   Encounter for antineoplastic chemotherapy Treatment plan as listed above  Anemia Stable hemoglobin.  Monitor.   Orders Placed This Encounter  Procedures   CBC with Differential (Cancer Center Only)    Standing Status:   Future    Standing Expiration Date:   07/19/2023   CMP (Cancer Center only)    Standing Status:   Future    Standing Expiration Date:   07/19/2023   PSA    Standing Status:   Future    Standing Expiration Date:   07/19/2023   Follow up  4 weeks  .  All questions were answered. The patient knows to call the clinic with any problems, questions or concerns.  [name], MD, PhD [facility] Hematology Oncology 07/19/2022    HISTORY OF PRESENTING  ILLNESS:   Matthew Mcgrath is a  77 y.o.  male presents for metastatic prostate cancer. Oncology History  Prostate cancer (HCC)  04/16/2020 Procedure   Firmagon 240 mg loading dose   04/21/2020 Imaging   CT abdomen pelvis showed a sclerotic lesion in the lumbar spine concerning for probable metastatic disease.  No definitive extra skeletal metastatic disease noted elsewhere in the abdomen or pelvis.  Mild circumferential bladder wall thickening.  Aortic atherosclerosis.   05/11/2020 Initial Diagnosis   Prostate cancer   04/01/2020, prostate biopsy showed  All cores were positive for high risk prostate cancer, primarily Gleason score 5+4 = 9, with max core involvement of 100% and perineural invasion present.   05/11/2020 Cancer Staging   Staging form: Prostate, AJCC 8th Edition - Clinical stage from 05/11/2020: Stage IVB (cTX, cN0, cM1, PSA: 30) - Signed by [name], MD on 05/11/2020 Prostate specific antigen (PSA) range: 20 or greater   10/19/2020 -  Radiation Therapy   finished definitive RT. to prostate cancer and pelvic lymph node treatments.   02/13/2021 Imaging   CT chest abdomen pelvis without contrast showed slight interval increase in sclerosis of previously osseous metastasis involving lumbar spine, most consistent with posttreatment sclerosis.  Focally severe disc degeneration and sclerosis versus sclerotic osseous metastatic of T6 and 7.  Attention on follow-up.  No noncontrast evidence of lymphadenopathy or soft tissue metastatic disease in chest abdomen pelvis.  Prostamegaly.  Enlargement of main pulmonary artery.  Coronary artery disease.  02/21/2021 Genetic Testing   [employer] Multi-Cancer+RNA cancer panel found a single, pathogenic variant in BRCA2 called [id number]. The remainder of testing was negative/normal.      03/11/2021 Imaging   , bone scan showed improved/decreased uptake in T6 and L2   04/27/2021 Imaging   Bone scan bone scan showed foci of abnormal uptake noted in the  mid thoracic and upper lumbar spine concerning for metastatic disease   12/07/2021 Imaging   CT chest abdomen pelvis wo contrast 1. Mild prostatomegaly without discrete mass appreciated by CT. 2. Unchanged sclerotic osseous metastatic disease, for example of the L1 and L2 vertebral bodies. 3. Unchanged, focally severe disc degenerative disease at T6-T7 with sclerosis of the vertebral bodies, most likely degenerative change although underlying osseous metastasis again not excluded. Continued attention on follow-up. 4. No noncontrast evidence of lymphadenopathy or soft tissue metastatic disease to the chest, abdomen, or pelvis. 5. Enlargement of the main pulmonary artery, as can be seen in pulmonary hypertension. 6. Coronary artery disease.   12/07/2021 Imaging   Bone scan showed 1. There is faint activity in the upper lumbar region, approximate L2 level, likely corresponds to faint sclerosis/metastasis on CT 2. New focus of activity at the left inferior sternum and right sixth rib anteriorly, possibly metastatic but no definitive corresponding lesion on CT. 3. New prominent activity at the left first rib end/sternoclavicular junction potentially degenerative, given appearance on CT. 4. Probable degenerative uptake at the bilateral feet, shoulders,and midthoracic spine   #06/11/2020 patient went to [facility] for second opinion was seen by [name] who agrees with continuing ADT with switch to Eligard 45 mg.  He is due to proceed with the treatments today.  Communicated with [facility] program [name], [facility] will [facility]. [name] also recommends to start darolutamide and authorization process has been initiated at [facility]. #07/06/2020 started darolutamide 600 mg twice daily # definitive treatment for prostate and pelvic node- Discussed with [facility] [name] and I appreciate his expert opinion of darolutamide concurrent use with RT and ADT  INTERVAL HISTORY CLYDE GODBOLT is a 77 y.o. male who has  above history reviewed by me today presents for follow up visit for management of metastatic prostate cancer Patient was accompanied by his brother.  He is currently on Olarparib 250mg  twice daily He denies any new bone pain.  Chronic back pain unchanged. Accompanied by sister  she think patient is " weak". This is a pre-existing and chronic condition.  + lower extremity weakness, decreased mobility.   Review of Systems  Constitutional:  Positive for fatigue. Negative for appetite change, chills, fever and unexpected weight change.  HENT:   Negative for hearing loss and voice change.   Eyes:  Negative for eye problems and icterus.  Respiratory:  Negative for chest tightness, cough and shortness of breath.   Cardiovascular:  Negative for chest pain and leg swelling.  Gastrointestinal:  Negative for abdominal distention and abdominal pain.  Endocrine: Negative for hot flashes.  Genitourinary:  Positive for dyspareunia. Negative for difficulty urinating, dysuria and frequency.   Musculoskeletal:  Positive for back pain. Negative for arthralgias.  Skin:  Negative for itching and rash.  Neurological:  Positive for extremity weakness. Negative for light-headedness and numbness.  Hematological:  Negative for adenopathy. Does not bruise/bleed easily.    MEDICAL HISTORY:  Past Medical History:  Diagnosis Date   Cardiomyopathy (HCC)    Congestive heart failure (HCC)    Diabetes mellitus type 2, controlled (HCC)    Elevated  lipids    Family history of breast cancer    Family history of colon cancer    HTN (hypertension)     SURGICAL HISTORY: Past Surgical History:  Procedure Laterality Date   CARDIAC CATHETERIZATION N/A 04/14/2015   Procedure: Left Heart Cath and Coronary Angiography;  Surgeon: Marcina Millard, MD;  Location: [facility] INVASIVE CV LAB;  Service: Cardiovascular;  Laterality: N/A;   CATARACT EXTRACTION W/ INTRAOCULAR LENS  IMPLANT, BILATERAL Left    COLONOSCOPY WITH PROPOFOL  N/A 04/08/2020   Procedure: COLONOSCOPY WITH PROPOFOL;  Surgeon: Toledo, Boykin Nearing, MD;  Location: [facility] ENDOSCOPY;  Service: Gastroenterology;  Laterality: N/A;   CYSTOSCOPY W/ RETROGRADES Bilateral 07/02/2021   Procedure: CYSTOSCOPY WITH RETROGRADE PYELOGRAM;  Surgeon: Sondra Come, MD;  Location: [facility] ORS;  Service: Urology;  Laterality: Bilateral;   HERNIA REPAIR     IMPLANTABLE CARDIOVERTER DEFIBRILLATOR (ICD) GENERATOR CHANGE Left 07/03/2015   Procedure: ICD IMPLANT single chamber;  Surgeon: Sharion Settler, MD;  Location: [facility] ORS;  Service: Cardiovascular;  Laterality: Left;   TRANSURETHRAL RESECTION OF BLADDER TUMOR N/A 07/02/2021   Procedure: TRANSURETHRAL RESECTION OF BLADDER TUMOR (TURBT);  Surgeon: Sondra Come, MD;  Location: [facility] ORS;  Service: Urology;  Laterality: N/A;    SOCIAL HISTORY: Social History   Socioeconomic History   Marital status: Single    Spouse name: Not on file   Number of children: Not on file   Years of education: Not on file   Highest education level: Not on file  Occupational History   Not on file  Tobacco Use   Smoking status: Former    Packs/day: 0.25    Years: 30.00    Additional pack years: 0.00    Total pack years: 7.50    Types: Cigarettes    Quit date: 02/11/2020    Years since quitting: 2.4   Smokeless tobacco: Never  Vaping Use   Vaping Use: Never used  Substance and Sexual Activity   Alcohol use: No   Drug use: Not Currently    Frequency: 2.0 times per week    Types: Marijuana    Comment: past use   Sexual activity: Not on file  Other Topics Concern   Not on file  Social History Narrative   Not on file   Social Determinants of Health   Financial Resource Strain: Not on file  Food Insecurity: Not on file  Transportation Needs: Not on file  Physical Activity: Not on file  Stress: Not on file  Social Connections: Not on file  Intimate Partner Violence: Not on file    FAMILY HISTORY: Family History  Problem  Relation Age of Onset   Heart failure Mother    Heart disease Mother    Alcoholism Father    Hypertension Sister    Diabetes Sister    Hypertension Brother    Diabetes Brother    Colon cancer Brother        vs prostate   Multiple myeloma Brother    Breast cancer Cousin     ALLERGIES:  has No Known Allergies.  MEDICATIONS:  Current Outpatient Medications  Medication Sig Dispense Refill   acetaminophen (TYLENOL) 650 MG CR tablet SMARTSIG:2 Tablet(s) By Mouth Twice Daily     ASPIRIN LOW DOSE 81 MG tablet Take 81 mg by mouth daily.     carvedilol (COREG) 3.125 MG tablet Take 3.125 mg by mouth 2 (two) times daily.     cholecalciferol (VITAMIN D3) 25 MCG (1000 UNIT) tablet Take  1,000 Units by mouth daily.     darolutamide (NUBEQA) 300 MG tablet Take 600 mg by mouth 2 (two) times daily with a meal.     glucose blood (TRUE METRIX BLOOD GLUCOSE TEST) test strip 1 each by Other route as needed for other. Use as instructed     GLUMETZA 1000 MG 24 hr tablet Take 1,000 mg by mouth daily.     JARDIANCE 10 MG TABS tablet Take 10 mg by mouth daily.     LEUPROLIDE ACETATE, 6 MONTH, 45 MG injection Inject into the skin.     LIDODERM 5 % Apply 1 patch to skin every morning as needed for L lower back pain. Remove after 12 hours. Call for refills     lisinopril (ZESTRIL) 2.5 MG tablet Take 2.5 mg by mouth daily.     LYNPARZA 100 MG tablet Take by mouth. Take with 150mg  for a total of 250mg      LYNPARZA 150 MG tablet Take by mouth. Take along with 100mg  for total of 250mg      magnesium oxide (MAG-OX) 400 (240 Mg) MG tablet Take 1 tablet by mouth 2 (two) times daily.     metFORMIN (GLUCOPHAGE-XR) 500 MG 24 hr tablet Take 500 mg by mouth every evening.     Multiple Vitamin (ONE-DAILY MULTI-VITAMIN) TABS Take 1 tablet by mouth daily.     Omega-3 Fatty Acids (OMEGA III EPA+DHA) 1000 MG CAPS Take 1 capsule by mouth 2 (two) times daily.     spironolactone (ALDACTONE) 25 MG tablet Take 0.5 tablets (12.5 mg  total) by mouth daily.     tamsulosin (FLOMAX) 0.4 MG CAPS capsule Take 1 capsule (0.4 mg total) by mouth daily. 30 capsule 2   TRUEplus Lancets 28G MISC by Does not apply route.     No current facility-administered medications for this visit.     PHYSICAL EXAMINATION: ECOG PERFORMANCE STATUS: 1 - Symptomatic but completely ambulatory Vitals:   07/19/22 1027  BP: (!) 137/93  Pulse: 88  Resp: 18  Temp: 97.8 F (36.6 C)   Filed Weights   07/19/22 1027  Weight: 191 lb (86.6 kg)    Physical Exam Constitutional:      General: He is not in acute distress. HENT:     Head: Normocephalic.  Eyes:     General: No scleral icterus. Cardiovascular:     Rate and Rhythm: Normal rate.  Pulmonary:     Effort: Pulmonary effort is normal. No respiratory distress.  Abdominal:     General: Bowel sounds are normal. There is no distension.     Palpations: Abdomen is soft.  Musculoskeletal:        General: No swelling.     Cervical back: Normal range of motion and neck supple.  Skin:    General: Skin is warm.     Findings: No rash.  Neurological:     Mental Status: He is alert. Mental status is at baseline.     Cranial Nerves: No cranial nerve deficit.     Comments: Oriented x 1  Psychiatric:        Mood and Affect: Mood normal.     LABORATORY DATA:  I have reviewed the data as listed    Latest Ref Rng & Units 07/18/2022   10:48 AM 06/24/2022   11:02 AM 05/13/2022   10:27 AM  CBC  WBC 4.0 - 10.5 K/uL 3.5  3.1  3.7   Hemoglobin 13.0 - 17.0 g/dL 09.8  11.9  14.7  Hematocrit 39.0 - 52.0 % 34.9  35.5  34.4   Platelets 150 - 400 K/uL 190  179  212       Latest Ref Rng & Units 07/18/2022   10:47 AM 06/24/2022   11:02 AM 05/13/2022   10:27 AM  CMP  Glucose 70 - 99 mg/dL 161  096  045   BUN 8 - 23 mg/dL 32  15  17   Creatinine 0.61 - 1.24 mg/dL 4.09  8.11  9.14   Sodium 135 - 145 mmol/L 138  133  139   Potassium 3.5 - 5.1 mmol/L 4.1  4.0  4.1   Chloride 98 - 111 mmol/L 104  101   107   CO2 22 - 32 mmol/L 23  21  23    Calcium 8.9 - 10.3 mg/dL 9.6  9.5  9.5   Total Protein 6.5 - 8.1 g/dL 7.4  7.1  7.5   Total Bilirubin 0.3 - 1.2 mg/dL 0.6  0.5  0.5   Alkaline Phos 38 - 126 U/L 87  67  56   AST 15 - 41 U/L 20  20  19    ALT 0 - 44 U/L 12  11  13      Iron/TIBC/Ferritin/ %Sat    Component Value Date/Time   IRON 82 11/23/2020 0953   TIBC 335 11/23/2020 0953   FERRITIN 221 11/23/2020 0953   IRONPCTSAT 25 11/23/2020 0953     RADIOGRAPHIC STUDIES: I have personally reviewed the radiological images as listed and agreed with the findings in the report. MR Brain W Wo Contrast  Result Date: 07/05/2022 CLINICAL DATA:  Lower extremity weakness. History of metastatic prostate cancer, diabetes, hypertension, and hyperlipidemia. EXAM: MRI HEAD WITHOUT AND WITH CONTRAST TECHNIQUE: Multiplanar, multiecho pulse sequences of the brain and surrounding structures were obtained without and with intravenous contrast. CONTRAST:  8mL GADAVIST GADOBUTROL 1 MMOL/ML IV SOLN COMPARISON:  PET-CT 03/31/2022. FINDINGS: Brain: There is no evidence of an acute infarct, intracranial hemorrhage, mass, midline shift, or extra-axial fluid collection. There is mild cerebral atrophy. Patchy and confluent T2 hyperintensities are present in the cerebral white matter, particularly severe in the frontal and parietal lobes towards the vertex. Milder T2 hyperintensities are present in the pons. 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 No abnormal enhancement is identified. Vascular: Major intracranial vascular flow voids are preserved. Skull and upper cervical spine: Abnormal T1 hypointense marrow signal in the left aspect of the C3 vertebral body with a small focus of mild FDG uptake in this region on the prior PET-CT. Sinuses/Orbits: Left cataract extraction. Paranasal sinuses and mastoid air cells are clear. Other: None. IMPRESSION: 1. No acute intracranial abnormality or evidence of intracranial  metastases. 2. Extensive cerebral white matter T2 signal changes, nonspecific though favored to reflect severe chronic small vessel ischemia given vascular risk factors. 3. Small chronic cerebellar infarcts. 4. 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 Electronically Signed   By: Sebastian Ache M.D.   On: 07/05/2022 22:36

## 2022-07-19 NOTE — Assessment & Plan Note (Signed)
Stable hemoglobin. Monitor  

## 2022-07-19 NOTE — Assessment & Plan Note (Addendum)
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 #NGS showed BRCA 2 S1404, BRIP1 R173C 3X, FOXO1 1S351fs. TMB 3.1 mut/Mb, MS stable, PD-L1 1%  Genetic testing is positive for BRCA2  Eligard 45 mg Q6 months with his PCP at [facility] given in November 2022. Labs reviewed and discussed with patient.   PSA nadir 0.01. PSA continues to increase to 3.29 PMSA showed new bone lesions, s/p palliative radiation.  Labs are reviewed and discussed with patient. Continue Olarparib 250mg  BID [ dose reduced due to performance status].  Consider to combine with ARPI, ie abiraterone. I will discuss with our pharmacist and update his  [facility] Dr. Arta Silence. Patient will get medication through [facility].

## 2022-07-19 NOTE — Assessment & Plan Note (Signed)
Treatment plan as listed above. 

## 2022-07-19 NOTE — Assessment & Plan Note (Addendum)
S/p palliative RT Consider bone strengthen agent in the future New C3 lesion on MRI. He is asymptomatic. Recommend observation.

## 2022-07-25 IMAGING — NM NM BONE WHOLE BODY
2 series · 10 of 10 positions shown · non-contrast
Comparison: 04/27/2020

Correlation: CT chest abdomen pelvis 02/12/2021

CLINICAL DATA: Prostate cancer, PSA 0.023 weeks ago

EXAM:
NUCLEAR MEDICINE WHOLE BODY BONE SCAN
TECHNIQUE: Whole body anterior and posterior images were obtained approximately
3 hours after intravenous injection of radiopharmaceutical.
RADIOPHARMACEUTICALS:  20.52 mCi 7echnetium-SSm MDP IV

[Series 1000: 3 hr wholebody · 2.40mm/px · 2 of 2 frames shown]
[frame 1/2]
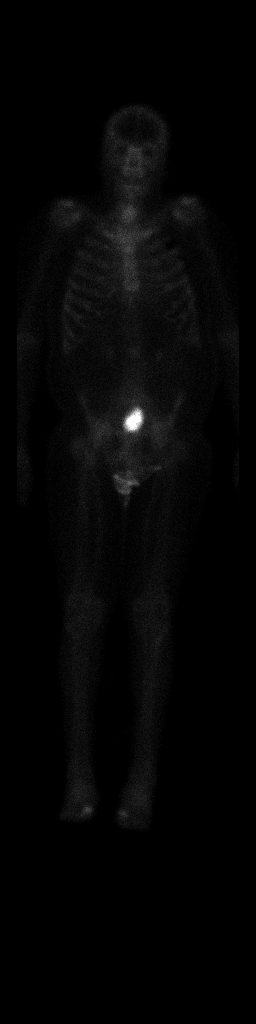
[frame 2/2]
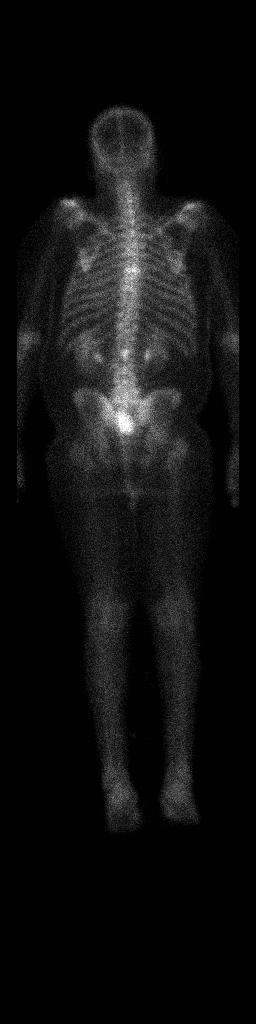

[Series 1000: statics · 2.40mm/px · 4 acquisitions, 8 frames shown]
[im 1/4]
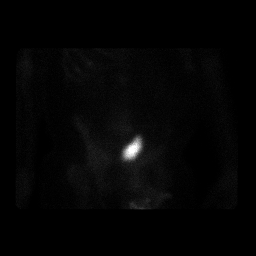
[im 1/4]
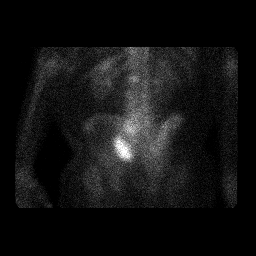
[im 2/4]
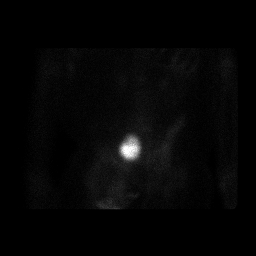
[im 2/4]
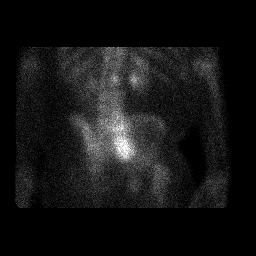
[im 3/4]
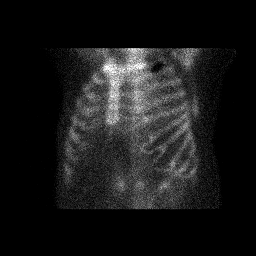
[im 3/4]
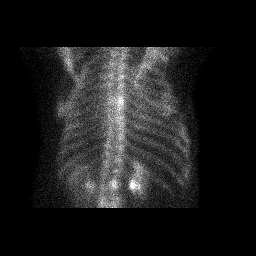
[im 4/4]
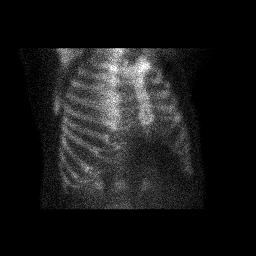
[im 4/4]
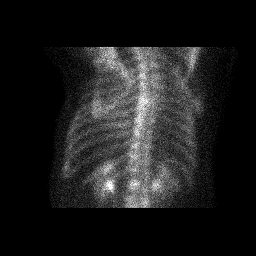

[10 of 10 positions shown; findings below may reference images not displayed]

FINDINGS: Again identified uptake at midthoracic spine at T6.

New subtle uptake T8 vertebra.

Decreased uptake at previously seen site in lumbar spine at
approximately L2.

Uptake at shoulders, sternoclavicular joints, hips, feet, typically
degenerative.

Photopenic defect LEFT chest wall from ICD generator.

Questionable uptake posterior RIGHT third rib versus artifact.

Expected urinary tract and soft tissue distribution of tracer.
IMPRESSION: Again identified sites of uptake at T6 and L2, degree of uptake at
L2 diminished since previous exam.

L2 uptake corresponds to sclerotic metastasis on CT.

Uptake at T6 corresponds to advanced degenerative disc disease
changes on CT.

Questioned uptake at T8 corresponds to mild degenerative disc
disease changes on CT.

No definite osseous abnormalities at posterior RIGHT third rib by
CT, questionable scintigraphic significance.

## 2022-08-04 ENCOUNTER — Ambulatory Visit (INDEPENDENT_AMBULATORY_CARE_PROVIDER_SITE_OTHER): Payer: Medicare (Managed Care) | Admitting: Urology

## 2022-08-04 ENCOUNTER — Encounter: Payer: Self-pay | Admitting: Urology

## 2022-08-04 VITALS — BP 125/82 | HR 81 | Ht 69.0 in | Wt 191.0 lb

## 2022-08-04 DIAGNOSIS — Z8551 Personal history of malignant neoplasm of bladder: Secondary | ICD-10-CM | POA: Diagnosis not present

## 2022-08-04 DIAGNOSIS — C61 Malignant neoplasm of prostate: Secondary | ICD-10-CM

## 2022-08-04 MED ORDER — SULFAMETHOXAZOLE-TRIMETHOPRIM 800-160 MG PO TABS
1.0000 | ORAL_TABLET | Freq: Once | ORAL | Status: AC
Start: 2022-08-04 — End: 2022-08-04
  Administered 2022-08-04: 1 via ORAL

## 2022-08-04 NOTE — Progress Notes (Signed)
Bladder cancer surveillance note  INDICATION History of bladder cancer  UROLOGIC HISTORY JSON Matthew Mcgrath is a 77 y.o. 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  With his comorbidities and frailty, second look TURBT and other intravesical treatments deferred.  Patient and family also opted to space surveillance imaging to every 6 months with his other medical issues and frailty, and understand the risk of delayed diagnosis of recurrent bladder cancer.  Initial Diagnosis of Bladder  Year: 07/02/2021 Pathology: HG T1 urothelial cell carcinoma  Recurrent Bladder Cancer Diagnosis None  Treatments for Bladder Cancer 07/02/2021: TURBT  AUA Risk Category High  Cystoscopy Procedure Note:  Bactrim given for prophylaxis  After informed consent and discussion of the procedure and its risks, Matthew Mcgrath was positioned and prepped in the standard fashion. Cystoscopy was performed with the a flexible cystoscope.  Prostate was moderate in size.  The urethra, bladder neck and entire bladder was visualized in a standard fashion.  Bladder mucosa grossly normal throughout.  The ureteral orifices were visualized in their normal location and orientation.  No abnormalities on retroflexion. Cytology deferred with his age, metastatic PCa, and co-morbidities  -Continue follow-up with oncology for metastatic prostate cancer -Using shared decision making, he would like to continue surveillance q2months, return precautions discussed at length including gross hematuria   Legrand Rams, MD 08/04/2022

## 2022-08-22 ENCOUNTER — Inpatient Hospital Stay: Payer: Medicare (Managed Care) | Attending: Oncology

## 2022-08-22 DIAGNOSIS — C61 Malignant neoplasm of prostate: Secondary | ICD-10-CM | POA: Insufficient documentation

## 2022-08-22 DIAGNOSIS — C7951 Secondary malignant neoplasm of bone: Secondary | ICD-10-CM | POA: Insufficient documentation

## 2022-08-22 LAB — CBC WITH DIFFERENTIAL (CANCER CENTER ONLY)
Abs Immature Granulocytes: 0.01 10*3/uL (ref 0.00–0.07)
Basophils Absolute: 0 10*3/uL (ref 0.0–0.1)
Basophils Relative: 1 %
Eosinophils Absolute: 0 10*3/uL (ref 0.0–0.5)
Eosinophils Relative: 1 %
HCT: 33.1 % — ABNORMAL LOW (ref 39.0–52.0)
Hemoglobin: 10.5 g/dL — ABNORMAL LOW (ref 13.0–17.0)
Immature Granulocytes: 0 %
Lymphocytes Relative: 13 %
Lymphs Abs: 0.5 10*3/uL — ABNORMAL LOW (ref 0.7–4.0)
MCH: 32.3 pg (ref 26.0–34.0)
MCHC: 31.7 g/dL (ref 30.0–36.0)
MCV: 101.8 fL — ABNORMAL HIGH (ref 80.0–100.0)
Monocytes Absolute: 0.3 10*3/uL (ref 0.1–1.0)
Monocytes Relative: 8 %
Neutro Abs: 2.8 10*3/uL (ref 1.7–7.7)
Neutrophils Relative %: 77 %
Platelet Count: 200 10*3/uL (ref 150–400)
RBC: 3.25 MIL/uL — ABNORMAL LOW (ref 4.22–5.81)
RDW: 15.9 % — ABNORMAL HIGH (ref 11.5–15.5)
WBC Count: 3.6 10*3/uL — ABNORMAL LOW (ref 4.0–10.5)
nRBC: 0.8 % — ABNORMAL HIGH (ref 0.0–0.2)

## 2022-08-22 LAB — CMP (CANCER CENTER ONLY)
ALT: 11 U/L (ref 0–44)
AST: 18 U/L (ref 15–41)
Albumin: 3.8 g/dL (ref 3.5–5.0)
Alkaline Phosphatase: 83 U/L (ref 38–126)
Anion gap: 11 (ref 5–15)
BUN: 22 mg/dL (ref 8–23)
CO2: 22 mmol/L (ref 22–32)
Calcium: 9.1 mg/dL (ref 8.9–10.3)
Chloride: 103 mmol/L (ref 98–111)
Creatinine: 0.96 mg/dL (ref 0.61–1.24)
GFR, Estimated: >60 mL/min (ref >60)
Glucose, Bld: 160 mg/dL — ABNORMAL HIGH (ref 70–99)
Potassium: 4 mmol/L (ref 3.5–5.1)
Sodium: 136 mmol/L (ref 135–145)
Total Bilirubin: 0.4 mg/dL (ref 0.3–1.2)
Total Protein: 7.1 g/dL (ref 6.5–8.1)

## 2022-08-22 LAB — PSA: Prostatic Specific Antigen: 0.9 ng/mL (ref 0.00–4.00)

## 2022-08-23 ENCOUNTER — Ambulatory Visit (HOSPITAL_COMMUNITY): Payer: Medicare (Managed Care)

## 2022-08-25 ENCOUNTER — Inpatient Hospital Stay (HOSPITAL_BASED_OUTPATIENT_CLINIC_OR_DEPARTMENT_OTHER): Payer: Medicare (Managed Care) | Admitting: Oncology

## 2022-08-25 ENCOUNTER — Encounter: Payer: Self-pay | Admitting: Oncology

## 2022-08-25 VITALS — BP 118/72 | HR 85 | Temp 95.1°F | Resp 18 | Wt 192.5 lb

## 2022-08-25 DIAGNOSIS — C61 Malignant neoplasm of prostate: Secondary | ICD-10-CM

## 2022-08-25 DIAGNOSIS — Z5111 Encounter for antineoplastic chemotherapy: Secondary | ICD-10-CM

## 2022-08-25 DIAGNOSIS — C7951 Secondary malignant neoplasm of bone: Secondary | ICD-10-CM

## 2022-08-25 DIAGNOSIS — D649 Anemia, unspecified: Secondary | ICD-10-CM | POA: Diagnosis not present

## 2022-08-25 NOTE — Progress Notes (Signed)
Hematology/Oncology Progress note [phone] [phone]     CHIEF COMPLAINTS/REASON FOR VISIT:  metastatic prostate cancer   ASSESSMENT & PLAN:   Cancer Staging  Prostate cancer [facility] [place]) Staging form: Prostate, AJCC 8th Edition - Clinical stage from 05/11/2020: Stage IVB (cTX, cN0, cM1, PSA: 30) - Signed by [name], MD on 05/11/2020   Prostate cancer [facility]) #Metastatic prostate cancer to bone, castration resistance disease. #NGS showed BRCA 2 S1404, BRIP1 R173C 3X, FOXO1 1S328fs. TMB 3.1 mut/Mb, MS stable, PD-L1 1%  Genetic testing is positive for BRCA2  Eligard 45 mg Q6 months with his PCP at [facility] reviewed and discussed with patient.   PSA nadir 0.01. PMSA showed new bone lesions, s/p palliative radiation.  PSA 3.29 --> 4.72-->0.9 Labs are reviewed and discussed with patient. Continue Olarparib 250mg  BID [ dose reduced due to performance status].  [facility].   Metastasis to bone [facility]) S/p palliative RT Consider bone strengthen agent in the future New C3 lesion on MRI. He is asymptomatic. Recommend observation.   Encounter for antineoplastic chemotherapy Treatment plan as listed above  Anemia Stable hemoglobin.  Monitor.   Orders Placed This Encounter  Procedures   CBC with Differential (Cancer Center Only)    Standing Status:   Future    Standing Expiration Date:   08/25/2023   CMP (Cancer Center only)    Standing Status:   Future    Standing Expiration Date:   08/25/2023   PSA    Standing Status:   Future    Standing Expiration Date:   08/25/2023   Follow up  2 months.   All questions were answered. The patient knows to call the clinic with any problems, questions or concerns.  [name], MD, PhD [facility] Hematology Oncology 08/25/2022    HISTORY OF PRESENTING ILLNESS:   Matthew Mcgrath is a  77 y.o.  male presents for metastatic prostate cancer. Oncology History  Prostate cancer (HCC)  04/16/2020  Procedure   Firmagon 240 mg loading dose   04/21/2020 Imaging   CT abdomen pelvis showed a sclerotic lesion in the lumbar spine concerning for probable metastatic disease.  No definitive extra skeletal metastatic disease noted elsewhere in the abdomen or pelvis.  Mild circumferential bladder wall thickening.  Aortic atherosclerosis.   05/11/2020 Initial Diagnosis   Prostate cancer   04/01/2020, prostate biopsy showed  All cores were positive for high risk prostate cancer, primarily Gleason score 5+4 = 9, with max core involvement of 100% and perineural invasion present.   05/11/2020 Cancer Staging   Staging form: Prostate, AJCC 8th Edition - Clinical stage from 05/11/2020: Stage IVB (cTX, cN0, cM1, PSA: 30) - Signed by [name], MD on 05/11/2020 Prostate specific antigen (PSA) range: 20 or greater   10/19/2020 -  Radiation Therapy   finished definitive RT. to prostate cancer and pelvic lymph node treatments.   02/13/2021 Imaging   CT chest abdomen pelvis without contrast showed slight interval increase in sclerosis of previously osseous metastasis involving lumbar spine, most consistent with posttreatment sclerosis.  Focally severe disc degeneration and sclerosis versus sclerotic osseous metastatic of T6 and 7.  Attention on follow-up.  No noncontrast evidence of lymphadenopathy or soft tissue metastatic disease in chest abdomen pelvis.  Prostamegaly.  Enlargement of main pulmonary artery.  Coronary artery disease.   02/21/2021 Genetic Testing   [employer] Multi-Cancer+RNA cancer panel found a single, pathogenic variant in BRCA2 called [id number]. The remainder of testing was negative/normal.  03/11/2021 Imaging   , bone scan showed improved/decreased uptake in T6 and L2   04/27/2021 Imaging   Bone scan bone scan showed foci of abnormal uptake noted in the mid thoracic and upper lumbar spine concerning for metastatic disease   12/07/2021 Imaging   CT chest abdomen pelvis wo contrast 1. Mild  prostatomegaly without discrete mass appreciated by CT. 2. Unchanged sclerotic osseous metastatic disease, for example of the L1 and L2 vertebral bodies. 3. Unchanged, focally severe disc degenerative disease at T6-T7 with sclerosis of the vertebral bodies, most likely degenerative change although underlying osseous metastasis again not excluded. Continued attention on follow-up. 4. No noncontrast evidence of lymphadenopathy or soft tissue metastatic disease to the chest, abdomen, or pelvis. 5. Enlargement of the main pulmonary artery, as can be seen in pulmonary hypertension. 6. Coronary artery disease.   12/07/2021 Imaging   Bone scan showed 1. There is faint activity in the upper lumbar region, approximate L2 level, likely corresponds to faint sclerosis/metastasis on CT 2. New focus of activity at the left inferior sternum and right sixth rib anteriorly, possibly metastatic but no definitive corresponding lesion on CT. 3. New prominent activity at the left first rib end/sternoclavicular junction potentially degenerative, given appearance on CT. 4. Probable degenerative uptake at the bilateral feet, shoulders,and midthoracic spine   #06/11/2020 patient went to [facility] for second opinion was seen by [name] who agrees with continuing ADT with switch to Eligard 45 mg.  He is due to proceed with the treatments today.  Communicated with [facility] [name], [facility] [name] also recommends to start darolutamide and authorization process has been initiated at [facility]. #07/06/2020 started darolutamide 600 mg twice daily # definitive treatment for prostate and pelvic node- Discussed with [facility] [name] and I appreciate his expert opinion of darolutamide concurrent use with RT and ADT  INTERVAL HISTORY Matthew Mcgrath is a 77 y.o. male who has above history reviewed by me today presents for follow up visit for management of metastatic prostate cancer Patient was accompanied by his  brother.  He is currently on Olarparib 250mg  twice daily He denies any new bone pain.  Chronic back pain unchanged. Accompanied by sister  she think patient is " weak". This is a pre-existing and chronic condition.  + lower extremity weakness, decreased mobility.   Review of Systems  Constitutional:  Positive for fatigue. Negative for appetite change, chills, fever and unexpected weight change.  HENT:   Negative for hearing loss and voice change.   Eyes:  Negative for eye problems and icterus.  Respiratory:  Negative for chest tightness, cough and shortness of breath.   Cardiovascular:  Negative for chest pain and leg swelling.  Gastrointestinal:  Negative for abdominal distention and abdominal pain.  Endocrine: Negative for hot flashes.  Genitourinary:  Positive for dyspareunia. Negative for difficulty urinating, dysuria and frequency.   Musculoskeletal:  Positive for back pain. Negative for arthralgias.  Skin:  Negative for itching and rash.  Neurological:  Positive for extremity weakness. Negative for light-headedness and numbness.  Hematological:  Negative for adenopathy. Does not bruise/bleed easily.    MEDICAL HISTORY:  Past Medical History:  Diagnosis Date   Cardiomyopathy (HCC)    Congestive heart failure (HCC)    Diabetes mellitus type 2, controlled (HCC)    Elevated lipids    Family history of breast cancer    Family history of colon cancer    HTN (hypertension)     SURGICAL HISTORY: Past  Surgical History:  Procedure Laterality Date   CARDIAC CATHETERIZATION N/A 04/14/2015   Procedure: Left Heart Cath and Coronary Angiography;  Surgeon: Marcina Millard, MD;  Location: [facility] INVASIVE CV LAB;  Service: Cardiovascular;  Laterality: N/A;   CATARACT EXTRACTION W/ INTRAOCULAR LENS  IMPLANT, BILATERAL Left    COLONOSCOPY WITH PROPOFOL N/A 04/08/2020   Procedure: COLONOSCOPY WITH PROPOFOL;  Surgeon: Toledo, Boykin Nearing, MD;  Location: [facility] ENDOSCOPY;  Service: Gastroenterology;   Laterality: N/A;   CYSTOSCOPY W/ RETROGRADES Bilateral 07/02/2021   Procedure: CYSTOSCOPY WITH RETROGRADE PYELOGRAM;  Surgeon: Sondra Come, MD;  Location: [facility] ORS;  Service: Urology;  Laterality: Bilateral;   HERNIA REPAIR     IMPLANTABLE CARDIOVERTER DEFIBRILLATOR (ICD) GENERATOR CHANGE Left 07/03/2015   Procedure: ICD IMPLANT single chamber;  Surgeon: Sharion Settler, MD;  Location: [facility] ORS;  Service: Cardiovascular;  Laterality: Left;   TRANSURETHRAL RESECTION OF BLADDER TUMOR N/A 07/02/2021   Procedure: TRANSURETHRAL RESECTION OF BLADDER TUMOR (TURBT);  Surgeon: Sondra Come, MD;  Location: [facility] ORS;  Service: Urology;  Laterality: N/A;    SOCIAL HISTORY: Social History   Socioeconomic History   Marital status: Single    Spouse name: Not on file   Number of children: Not on file   Years of education: Not on file   Highest education level: Not on file  Occupational History   Not on file  Tobacco Use   Smoking status: Former    Packs/day: 0.25    Years: 30.00    Additional pack years: 0.00    Total pack years: 7.50    Types: Cigarettes    Quit date: 02/11/2020    Years since quitting: 2.5    Passive exposure: Past   Smokeless tobacco: Never  Vaping Use   Vaping Use: Never used  Substance and Sexual Activity   Alcohol use: No   Drug use: Not Currently    Frequency: 2.0 times per week    Types: Marijuana    Comment: past use   Sexual activity: Not on file  Other Topics Concern   Not on file  Social History Narrative   Not on file   Social Determinants of Health   Financial Resource Strain: Not on file  Food Insecurity: Not on file  Transportation Needs: Not on file  Physical Activity: Not on file  Stress: Not on file  Social Connections: Not on file  Intimate Partner Violence: Not on file    FAMILY HISTORY: Family History  Problem Relation Age of Onset   Heart failure Mother    Heart disease Mother    Alcoholism Father    Hypertension Sister     Diabetes Sister    Hypertension Brother    Diabetes Brother    Colon cancer Brother        vs prostate   Multiple myeloma Brother    Breast cancer Cousin     ALLERGIES:  has No Known Allergies.  MEDICATIONS:  Current Outpatient Medications  Medication Sig Dispense Refill   acetaminophen (TYLENOL) 650 MG CR tablet SMARTSIG:2 Tablet(s) By Mouth Twice Daily     ASPIRIN LOW DOSE 81 MG tablet Take 81 mg by mouth daily.     carvedilol (COREG) 3.125 MG tablet Take 3.125 mg by mouth 2 (two) times daily.     cholecalciferol (VITAMIN D3) 25 MCG (1000 UNIT) tablet Take 1,000 Units by mouth daily.     darolutamide (NUBEQA) 300 MG tablet Take 600 mg by mouth 2 (two) times daily  with a meal.     glucose blood (TRUE METRIX BLOOD GLUCOSE TEST) test strip 1 each by Other route as needed for other. Use as instructed     GLUMETZA 1000 MG 24 hr tablet Take 1,000 mg by mouth daily.     JARDIANCE 10 MG TABS tablet Take 10 mg by mouth daily.     LEUPROLIDE ACETATE, 6 MONTH, 45 MG injection Inject into the skin.     LIDODERM 5 % Apply 1 patch to skin every morning as needed for L lower back pain. Remove after 12 hours. Call for refills     lisinopril (ZESTRIL) 2.5 MG tablet Take 2.5 mg by mouth daily.     LYNPARZA 100 MG tablet Take by mouth. Take with 150mg  for a total of 250mg      LYNPARZA 150 MG tablet Take by mouth. Take along with 100mg  for total of 250mg      magnesium oxide (MAG-OX) 400 (240 Mg) MG tablet Take 1 tablet by mouth 2 (two) times daily.     metFORMIN (GLUCOPHAGE-XR) 500 MG 24 hr tablet Take 500 mg by mouth every evening.     Multiple Vitamin (ONE-DAILY MULTI-VITAMIN) TABS Take 1 tablet by mouth daily.     Omega-3 Fatty Acids (OMEGA III EPA+DHA) 1000 MG CAPS Take 1 capsule by mouth 2 (two) times daily.     spironolactone (ALDACTONE) 25 MG tablet Take 0.5 tablets (12.5 mg total) by mouth daily.     tamsulosin (FLOMAX) 0.4 MG CAPS capsule Take 1 capsule (0.4 mg total) by mouth daily. 30  capsule 2   TRUEplus Lancets 28G MISC by Does not apply route.     No current facility-administered medications for this visit.     PHYSICAL EXAMINATION: ECOG PERFORMANCE STATUS: 1 - Symptomatic but completely ambulatory Vitals:   08/25/22 1013  BP: 118/72  Pulse: 85  Resp: 18  Temp: (!) 95.1 F (35.1 C)  SpO2: 98%   Filed Weights   08/25/22 1013  Weight: 192 lb 8 oz (87.3 kg)    Physical Exam Constitutional:      General: He is not in acute distress. HENT:     Head: Normocephalic.  Eyes:     General: No scleral icterus. Cardiovascular:     Rate and Rhythm: Normal rate.  Pulmonary:     Effort: Pulmonary effort is normal. No respiratory distress.  Abdominal:     General: Bowel sounds are normal. There is no distension.     Palpations: Abdomen is soft.  Musculoskeletal:        General: No swelling.     Cervical back: Normal range of motion and neck supple.  Skin:    General: Skin is warm.     Findings: No rash.  Neurological:     Mental Status: He is alert. Mental status is at baseline.     Cranial Nerves: No cranial nerve deficit.     Comments: Oriented x 1  Psychiatric:        Mood and Affect: Mood normal.     LABORATORY DATA:  I have reviewed the data as listed    Latest Ref Rng & Units 08/22/2022   10:55 AM 07/18/2022   10:48 AM 06/24/2022   11:02 AM  CBC  WBC 4.0 - 10.5 K/uL 3.6  3.5  3.1   Hemoglobin 13.0 - 17.0 g/dL 16.1  09.6  04.5   Hematocrit 39.0 - 52.0 % 33.1  34.9  35.5   Platelets 150 - 400  K/uL 200  190  179       Latest Ref Rng & Units 08/22/2022   10:55 AM 07/18/2022   10:47 AM 06/24/2022   11:02 AM  CMP  Glucose 70 - 99 mg/dL 540  981  191   BUN 8 - 23 mg/dL 22  32  15   Creatinine 0.61 - 1.24 mg/dL 4.78  2.95  6.21   Sodium 135 - 145 mmol/L 136  138  133   Potassium 3.5 - 5.1 mmol/L 4.0  4.1  4.0   Chloride 98 - 111 mmol/L 103  104  101   CO2 22 - 32 mmol/L 22  23  21    Calcium 8.9 - 10.3 mg/dL 9.1  9.6  9.5   Total Protein 6.5  - 8.1 g/dL 7.1  7.4  7.1   Total Bilirubin 0.3 - 1.2 mg/dL 0.4  0.6  0.5   Alkaline Phos 38 - 126 U/L 83  87  67   AST 15 - 41 U/L 18  20  20    ALT 0 - 44 U/L 11  12  11      Iron/TIBC/Ferritin/ %Sat    Component Value Date/Time   IRON 82 11/23/2020 0953   TIBC 335 11/23/2020 0953   FERRITIN 221 11/23/2020 0953   IRONPCTSAT 25 11/23/2020 0953     RADIOGRAPHIC STUDIES: I have personally reviewed the radiological images as listed and agreed with the findings in the report. MR Brain W Wo Contrast  Result Date: 07/05/2022 CLINICAL DATA:  Lower extremity weakness. History of metastatic prostate cancer, diabetes, hypertension, and hyperlipidemia. EXAM: MRI HEAD WITHOUT AND WITH CONTRAST TECHNIQUE: Multiplanar, multiecho pulse sequences of the brain and surrounding structures were obtained without and with intravenous contrast. CONTRAST:  8mL GADAVIST GADOBUTROL 1 MMOL/ML IV SOLN COMPARISON:  PET-CT 03/31/2022. FINDINGS: Brain: There is no evidence of an acute infarct, intracranial hemorrhage, mass, midline shift, or extra-axial fluid collection. There is mild cerebral atrophy. Patchy and confluent T2 hyperintensities are present in the cerebral white matter, particularly severe in the frontal and parietal lobes towards the vertex. Milder T2 hyperintensities are present in the pons. 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 No abnormal enhancement is identified. Vascular: Major intracranial vascular flow voids are preserved. Skull and upper cervical spine: Abnormal T1 hypointense marrow signal in the left aspect of the C3 vertebral body with a small focus of mild FDG uptake in this region on the prior PET-CT. Sinuses/Orbits: Left cataract extraction. Paranasal sinuses and mastoid air cells are clear. Other: None. IMPRESSION: 1. No acute intracranial abnormality or evidence of intracranial metastases. 2. Extensive cerebral white matter T2 signal changes, nonspecific though favored to reflect  severe chronic small vessel ischemia given vascular risk factors. 3. Small chronic cerebellar infarcts. 4. 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 Electronically Signed   By: Sebastian Ache M.D.   On: 07/05/2022 22:36

## 2022-08-25 NOTE — Assessment & Plan Note (Signed)
Treatment plan as listed above. 

## 2022-08-25 NOTE — Assessment & Plan Note (Signed)
S/p palliative RT Consider bone strengthen agent in the future New C3 lesion on MRI. He is asymptomatic. Recommend observation.  

## 2022-08-25 NOTE — Assessment & Plan Note (Signed)
Stable hemoglobin. Monitor  

## 2022-08-25 NOTE — Assessment & Plan Note (Addendum)
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 #NGS showed BRCA 2 S1404, BRIP1 R173C 3X, FOXO1 1S314fs. TMB 3.1 mut/Mb, MS stable, PD-L1 1%  Genetic testing is positive for BRCA2  Eligard 45 mg Q6 months with his PCP at [facility] reviewed and discussed with patient.   PSA nadir 0.01. PMSA showed new bone lesions, s/p palliative radiation.  PSA 3.29 --> 4.72-->0.9 Labs are reviewed and discussed with patient. Continue Olarparib 250mg  BID [ dose reduced due to performance status].  [facility].

## 2022-09-18 ENCOUNTER — Encounter: Payer: Self-pay | Admitting: Oncology

## 2022-10-17 ENCOUNTER — Other Ambulatory Visit: Payer: Self-pay | Admitting: Family Medicine

## 2022-10-17 DIAGNOSIS — Z0000 Encounter for general adult medical examination without abnormal findings: Secondary | ICD-10-CM

## 2022-10-17 DIAGNOSIS — Z5181 Encounter for therapeutic drug level monitoring: Secondary | ICD-10-CM

## 2022-10-17 DIAGNOSIS — E559 Vitamin D deficiency, unspecified: Secondary | ICD-10-CM

## 2022-10-25 ENCOUNTER — Inpatient Hospital Stay: Payer: Medicare (Managed Care) | Attending: Oncology

## 2022-10-25 DIAGNOSIS — Z807 Family history of other malignant neoplasms of lymphoid, hematopoietic and related tissues: Secondary | ICD-10-CM | POA: Diagnosis not present

## 2022-10-25 DIAGNOSIS — Z803 Family history of malignant neoplasm of breast: Secondary | ICD-10-CM | POA: Diagnosis not present

## 2022-10-25 DIAGNOSIS — D649 Anemia, unspecified: Secondary | ICD-10-CM | POA: Diagnosis not present

## 2022-10-25 DIAGNOSIS — C61 Malignant neoplasm of prostate: Secondary | ICD-10-CM | POA: Diagnosis present

## 2022-10-25 DIAGNOSIS — C7951 Secondary malignant neoplasm of bone: Secondary | ICD-10-CM | POA: Insufficient documentation

## 2022-10-25 DIAGNOSIS — Z87891 Personal history of nicotine dependence: Secondary | ICD-10-CM | POA: Insufficient documentation

## 2022-10-25 LAB — CBC WITH DIFFERENTIAL (CANCER CENTER ONLY)
Abs Immature Granulocytes: 0.01 10*3/uL (ref 0.00–0.07)
Basophils Absolute: 0 10*3/uL (ref 0.0–0.1)
Basophils Relative: 0 %
Eosinophils Absolute: 0 10*3/uL (ref 0.0–0.5)
Eosinophils Relative: 1 %
HCT: 31.8 % — ABNORMAL LOW (ref 39.0–52.0)
Hemoglobin: 10.2 g/dL — ABNORMAL LOW (ref 13.0–17.0)
Immature Granulocytes: 0 %
Lymphocytes Relative: 15 %
Lymphs Abs: 0.5 10*3/uL — ABNORMAL LOW (ref 0.7–4.0)
MCH: 32.2 pg (ref 26.0–34.0)
MCHC: 32.1 g/dL (ref 30.0–36.0)
MCV: 100.3 fL — ABNORMAL HIGH (ref 80.0–100.0)
Monocytes Absolute: 0.3 10*3/uL (ref 0.1–1.0)
Monocytes Relative: 10 %
Neutro Abs: 2.4 10*3/uL (ref 1.7–7.7)
Neutrophils Relative %: 74 %
Platelet Count: 192 10*3/uL (ref 150–400)
RBC: 3.17 MIL/uL — ABNORMAL LOW (ref 4.22–5.81)
RDW: 15.1 % (ref 11.5–15.5)
WBC Count: 3.2 10*3/uL — ABNORMAL LOW (ref 4.0–10.5)
nRBC: 0.6 % — ABNORMAL HIGH (ref 0.0–0.2)

## 2022-10-25 LAB — CMP (CANCER CENTER ONLY)
ALT: 11 U/L (ref 0–44)
AST: 15 U/L (ref 15–41)
Albumin: 3.7 g/dL (ref 3.5–5.0)
Alkaline Phosphatase: 53 U/L (ref 38–126)
Anion gap: 7 (ref 5–15)
BUN: 22 mg/dL (ref 8–23)
CO2: 22 mmol/L (ref 22–32)
Calcium: 9.2 mg/dL (ref 8.9–10.3)
Chloride: 105 mmol/L (ref 98–111)
Creatinine: 0.82 mg/dL (ref 0.61–1.24)
GFR, Estimated: >60 mL/min (ref >60)
Glucose, Bld: 147 mg/dL — ABNORMAL HIGH (ref 70–99)
Potassium: 3.8 mmol/L (ref 3.5–5.1)
Sodium: 134 mmol/L — ABNORMAL LOW (ref 135–145)
Total Bilirubin: 0.5 mg/dL (ref 0.3–1.2)
Total Protein: 7 g/dL (ref 6.5–8.1)

## 2022-10-25 LAB — PSA: Prostatic Specific Antigen: 0.16 ng/mL (ref 0.00–4.00)

## 2022-10-27 ENCOUNTER — Inpatient Hospital Stay (HOSPITAL_BASED_OUTPATIENT_CLINIC_OR_DEPARTMENT_OTHER): Payer: Medicare (Managed Care) | Admitting: Oncology

## 2022-10-27 ENCOUNTER — Encounter: Payer: Self-pay | Admitting: Oncology

## 2022-10-27 VITALS — BP 140/81 | HR 86 | Temp 96.0°F | Resp 18 | Wt 189.1 lb

## 2022-10-27 DIAGNOSIS — C7951 Secondary malignant neoplasm of bone: Secondary | ICD-10-CM | POA: Diagnosis not present

## 2022-10-27 DIAGNOSIS — C61 Malignant neoplasm of prostate: Secondary | ICD-10-CM

## 2022-10-27 DIAGNOSIS — Z5111 Encounter for antineoplastic chemotherapy: Secondary | ICD-10-CM

## 2022-10-27 DIAGNOSIS — D649 Anemia, unspecified: Secondary | ICD-10-CM | POA: Diagnosis not present

## 2022-10-27 NOTE — Progress Notes (Signed)
Hematology/Oncology Progress note [phone] [phone]) [phone]     CHIEF COMPLAINTS/REASON FOR VISIT:  metastatic prostate cancer   ASSESSMENT & PLAN:   Cancer Staging  Prostate cancer [facility]) Staging form: Prostate, AJCC 8th Edition - Clinical stage from 05/11/2020: Stage IVB (cTX, cN0, cM1, PSA: 30) - Signed by [name], MD on 05/11/2020   Prostate cancer [facility]) #Metastatic prostate cancer to bone, castration resistance disease. #NGS showed BRCA 2 S1404, BRIP1 R173C 3X, FOXO1 1S339fs. TMB 3.1 mut/Mb, MS stable, PD-L1 1%  Genetic testing is positive for BRCA2  Eligard 45 mg Q6 months with his PCP at [facility] reviewed and discussed with patient.   PSA nadir 0.01. PMSA showed new bone lesions, s/p palliative radiation.  PSA 3.29 --> 4.72-->0.9 -->0.16 Labs are reviewed and discussed with patient. Continue Olarparib 250mg  BID [ dose reduced due to performance status].  [facility].   Encounter for antineoplastic chemotherapy Treatment plan as listed above  Anemia Slight decreased hemoglobin.  Likely due to marrow side effects due to Olarparib.  Recommend to check iron tibc ferritin, folate, b12 at next visit.  Continue monitor  Metastasis to bone [facility]) S/p palliative RT Discussed about bone strengthing agents. Sister and patient would like to defer for now.    Orders Placed This Encounter  Procedures   CBC with Differential (Cancer Center Only)    Standing Status:   Future    Standing Expiration Date:   10/27/2023   CMP (Cancer Center only)    Standing Status:   Future    Standing Expiration Date:   10/27/2023   PSA    Standing Status:   Future    Standing Expiration Date:   10/27/2023   Iron and TIBC    Standing Status:   Future    Standing Expiration Date:   10/27/2023   Ferritin    Standing Status:   Future    Standing Expiration Date:   10/27/2023   Retic Panel    Standing Status:   Future    Standing Expiration Date:    10/27/2023   Vitamin B12    Standing Status:   Future    Standing Expiration Date:   10/27/2023   Folate    Standing Status:   Future    Standing Expiration Date:   10/27/2023   Follow up 6 weeks   All questions were answered. The patient knows to call the clinic with any problems, questions or concerns.  [name], MD, PhD [facility] Hematology Oncology 10/27/2022    HISTORY OF PRESENTING ILLNESS:   Matthew Mcgrath is a  77 y.o.  male presents for metastatic prostate cancer. Oncology History  Prostate cancer (HCC)  04/16/2020 Procedure   Firmagon 240 mg loading dose   04/21/2020 Imaging   CT abdomen pelvis showed a sclerotic lesion in the lumbar spine concerning for probable metastatic disease.  No definitive extra skeletal metastatic disease noted elsewhere in the abdomen or pelvis.  Mild circumferential bladder wall thickening.  Aortic atherosclerosis.   05/11/2020 Initial Diagnosis   Prostate cancer   04/01/2020, prostate biopsy showed  All cores were positive for high risk prostate cancer, primarily Gleason score 5+4 = 9, with max core involvement of 100% and perineural invasion present.   05/11/2020 Cancer Staging   Staging form: Prostate, AJCC 8th Edition - Clinical stage from 05/11/2020: Stage IVB (cTX, cN0, cM1, PSA: 30) - Signed by [name], MD on 05/11/2020 Prostate specific antigen (PSA) range:  20 or greater   10/19/2020 -  Radiation Therapy   finished definitive RT. to prostate cancer and pelvic lymph node treatments.   02/13/2021 Imaging   CT chest abdomen pelvis without contrast showed slight interval increase in sclerosis of previously osseous metastasis involving lumbar spine, most consistent with posttreatment sclerosis.  Focally severe disc degeneration and sclerosis versus sclerotic osseous metastatic of T6 and 7.  Attention on follow-up.  No noncontrast evidence of lymphadenopathy or soft tissue metastatic disease in chest abdomen pelvis.  Prostamegaly.  Enlargement of  main pulmonary artery.  Coronary artery disease.   02/21/2021 Genetic Testing   [employer] Multi-Cancer+RNA cancer panel found a single, pathogenic variant in BRCA2 called [id number]. The remainder of testing was negative/normal.      03/11/2021 Imaging   , bone scan showed improved/decreased uptake in T6 and L2   04/27/2021 Imaging   Bone scan bone scan showed foci of abnormal uptake noted in the mid thoracic and upper lumbar spine concerning for metastatic disease   12/07/2021 Imaging   CT chest abdomen pelvis wo contrast 1. Mild prostatomegaly without discrete mass appreciated by CT. 2. Unchanged sclerotic osseous metastatic disease, for example of the L1 and L2 vertebral bodies. 3. Unchanged, focally severe disc degenerative disease at T6-T7 with sclerosis of the vertebral bodies, most likely degenerative change although underlying osseous metastasis again not excluded. Continued attention on follow-up. 4. No noncontrast evidence of lymphadenopathy or soft tissue metastatic disease to the chest, abdomen, or pelvis. 5. Enlargement of the main pulmonary artery, as can be seen in pulmonary hypertension. 6. Coronary artery disease.   12/07/2021 Imaging   Bone scan showed 1. There is faint activity in the upper lumbar region, approximate L2 level, likely corresponds to faint sclerosis/metastasis on CT 2. New focus of activity at the left inferior sternum and right sixth rib anteriorly, possibly metastatic but no definitive corresponding lesion on CT. 3. New prominent activity at the left first rib end/sternoclavicular junction potentially degenerative, given appearance on CT. 4. Probable degenerative uptake at the bilateral feet, shoulders,and midthoracic spine   #06/11/2020 patient went to [facility] for second opinion was seen by [name] who agrees with continuing ADT with switch to Eligard 45 mg.  He is due to proceed with the treatments today.  [facility]icated with [name], patient will  [facility] [name] also recommends to start darolutamide and authorization process has been initiated at [facility]. #07/06/2020 started darolutamide 600 mg twice daily # definitive treatment for prostate and pelvic node- Discussed with [facility] [name] and I appreciate his expert opinion of darolutamide concurrent use with RT and ADT  INTERVAL HISTORY Matthew Mcgrath is a 77 y.o. male who has above history reviewed by me today presents for follow up visit for management of metastatic prostate cancer Patient was accompanied by his brother.  He is currently on Olarparib 250mg  twice daily He denies any new bone pain.  Chronic back pain unchanged. Accompanied by sister  + lower extremity weakness, decreased mobility, chronic issue for him.   Review of Systems  Constitutional:  Positive for fatigue. Negative for appetite change, chills, fever and unexpected weight change.  HENT:   Negative for hearing loss and voice change.   Eyes:  Negative for eye problems and icterus.  Respiratory:  Negative for chest tightness, cough and shortness of breath.   Cardiovascular:  Negative for chest pain and leg swelling.  Gastrointestinal:  Negative for abdominal distention and abdominal pain.  Endocrine: Negative for hot  flashes.  Genitourinary:  Positive for dyspareunia. Negative for difficulty urinating, dysuria and frequency.   Musculoskeletal:  Positive for back pain. Negative for arthralgias.  Skin:  Negative for itching and rash.  Neurological:  Positive for extremity weakness. Negative for light-headedness and numbness.  Hematological:  Negative for adenopathy. Does not bruise/bleed easily.    MEDICAL HISTORY:  Past Medical History:  Diagnosis Date   Cardiomyopathy (HCC)    Congestive heart failure (HCC)    Diabetes mellitus type 2, controlled (HCC)    Elevated lipids    Family history of breast cancer    Family history of colon cancer    HTN (hypertension)     SURGICAL HISTORY: Past  Surgical History:  Procedure Laterality Date   CARDIAC CATHETERIZATION N/A 04/14/2015   Procedure: Left Heart Cath and Coronary Angiography;  Surgeon: Marcina Millard, MD;  Location: [facility] INVASIVE CV LAB;  Service: Cardiovascular;  Laterality: N/A;   CATARACT EXTRACTION W/ INTRAOCULAR LENS  IMPLANT, BILATERAL Left    COLONOSCOPY WITH PROPOFOL N/A 04/08/2020   Procedure: COLONOSCOPY WITH PROPOFOL;  Surgeon: Toledo, Boykin Nearing, MD;  Location: [facility] ENDOSCOPY;  Service: Gastroenterology;  Laterality: N/A;   CYSTOSCOPY W/ RETROGRADES Bilateral 07/02/2021   Procedure: CYSTOSCOPY WITH RETROGRADE PYELOGRAM;  Surgeon: Sondra Come, MD;  Location: [facility] ORS;  Service: Urology;  Laterality: Bilateral;   HERNIA REPAIR     IMPLANTABLE CARDIOVERTER DEFIBRILLATOR (ICD) GENERATOR CHANGE Left 07/03/2015   Procedure: ICD IMPLANT single chamber;  Surgeon: Sharion Settler, MD;  Location: [facility] ORS;  Service: Cardiovascular;  Laterality: Left;   TRANSURETHRAL RESECTION OF BLADDER TUMOR N/A 07/02/2021   Procedure: TRANSURETHRAL RESECTION OF BLADDER TUMOR (TURBT);  Surgeon: Sondra Come, MD;  Location: [facility] ORS;  Service: Urology;  Laterality: N/A;    SOCIAL HISTORY: Social History   Socioeconomic History   Marital status: Single    Spouse name: Not on file   Number of children: Not on file   Years of education: Not on file   Highest education level: Not on file  Occupational History   Not on file  Tobacco Use   Smoking status: Former    Current packs/day: 0.00    Average packs/day: 0.3 packs/day for 30.0 years (7.5 ttl pk-yrs)    Types: Cigarettes    Start date: 02/10/1990    Quit date: 02/11/2020    Years since quitting: 2.7    Passive exposure: Past   Smokeless tobacco: Never  Vaping Use   Vaping status: Never Used  Substance and Sexual Activity   Alcohol use: No   Drug use: Not Currently    Frequency: 2.0 times per week    Types: Marijuana    Comment: past use   Sexual activity: Not on file   Other Topics Concern   Not on file  Social History Narrative   Not on file   Social Determinants of Health   Financial Resource Strain: Not on file  Food Insecurity: Not on file  Transportation Needs: Not on file  Physical Activity: Not on file  Stress: Not on file  Social Connections: Not on file  Intimate Partner Violence: Not on file    FAMILY HISTORY: Family History  Problem Relation Age of Onset   Heart failure Mother    Heart disease Mother    Alcoholism Father    Hypertension Sister    Diabetes Sister    Hypertension Brother    Diabetes Brother    Colon cancer Brother  vs prostate   Multiple myeloma Brother    Breast cancer Cousin     ALLERGIES:  has No Known Allergies.  MEDICATIONS:  Current Outpatient Medications  Medication Sig Dispense Refill   acetaminophen (TYLENOL) 650 MG CR tablet SMARTSIG:2 Tablet(s) By Mouth Twice Daily     ASPIRIN LOW DOSE 81 MG tablet Take 81 mg by mouth daily.     carvedilol (COREG) 3.125 MG tablet Take 3.125 mg by mouth 2 (two) times daily.     cholecalciferol (VITAMIN D3) 25 MCG (1000 UNIT) tablet Take 1,000 Units by mouth daily.     darolutamide (NUBEQA) 300 MG tablet Take 600 mg by mouth 2 (two) times daily with a meal.     glucose blood (TRUE METRIX BLOOD GLUCOSE TEST) test strip 1 each by Other route as needed for other. Use as instructed     GLUMETZA 1000 MG 24 hr tablet Take 1,000 mg by mouth daily.     JARDIANCE 10 MG TABS tablet Take 10 mg by mouth daily.     LEUPROLIDE ACETATE, 6 MONTH, 45 MG injection Inject into the skin.     LIDODERM 5 % Apply 1 patch to skin every morning as needed for L lower back pain. Remove after 12 hours. Call for refills     lisinopril (ZESTRIL) 2.5 MG tablet Take 2.5 mg by mouth daily.     LYNPARZA 100 MG tablet Take by mouth. Take with 150mg  for a total of 250mg      LYNPARZA 150 MG tablet Take by mouth. Take along with 100mg  for total of 250mg      magnesium oxide (MAG-OX) 400 (240  Mg) MG tablet Take 1 tablet by mouth 2 (two) times daily.     metFORMIN (GLUCOPHAGE-XR) 500 MG 24 hr tablet Take 500 mg by mouth every evening.     Multiple Vitamin (ONE-DAILY MULTI-VITAMIN) TABS Take 1 tablet by mouth daily.     Omega-3 Fatty Acids (OMEGA III EPA+DHA) 1000 MG CAPS Take 1 capsule by mouth 2 (two) times daily.     spironolactone (ALDACTONE) 25 MG tablet Take 0.5 tablets (12.5 mg total) by mouth daily.     tamsulosin (FLOMAX) 0.4 MG CAPS capsule Take 1 capsule (0.4 mg total) by mouth daily. 30 capsule 2   TRUEplus Lancets 28G MISC by Does not apply route.     No current facility-administered medications for this visit.     PHYSICAL EXAMINATION: ECOG PERFORMANCE STATUS: 1 - Symptomatic but completely ambulatory Vitals:   10/27/22 1042  BP: (!) 140/81  Pulse: 86  Resp: 18  Temp: (!) 96 F (35.6 C)  SpO2: 98%   Filed Weights   10/27/22 1042  Weight: 189 lb 1.6 oz (85.8 kg)    Physical Exam Constitutional:      General: He is not in acute distress. HENT:     Head: Normocephalic.  Eyes:     General: No scleral icterus. Cardiovascular:     Rate and Rhythm: Normal rate.  Pulmonary:     Effort: Pulmonary effort is normal. No respiratory distress.  Abdominal:     General: Bowel sounds are normal. There is no distension.     Palpations: Abdomen is soft.  Musculoskeletal:        General: No swelling.     Cervical back: Normal range of motion and neck supple.  Skin:    General: Skin is warm.     Findings: No rash.  Neurological:     Mental Status: He  is alert. Mental status is at baseline.     Cranial Nerves: No cranial nerve deficit.     Comments: Oriented x 1  Psychiatric:        Mood and Affect: Mood normal.     LABORATORY DATA:  I have reviewed the data as listed    Latest Ref Rng & Units 10/25/2022   10:29 AM 08/22/2022   10:55 AM 07/18/2022   10:48 AM  CBC  WBC 4.0 - 10.5 K/uL 3.2  3.6  3.5   Hemoglobin 13.0 - 17.0 g/dL 16.1  09.6  04.5    Hematocrit 39.0 - 52.0 % 31.8  33.1  34.9   Platelets 150 - 400 K/uL 192  200  190       Latest Ref Rng & Units 10/25/2022   10:28 AM 08/22/2022   10:55 AM 07/18/2022   10:47 AM  CMP  Glucose 70 - 99 mg/dL 409  811  914   BUN 8 - 23 mg/dL 22  22  32   Creatinine 0.61 - 1.24 mg/dL 7.82  9.56  2.13   Sodium 135 - 145 mmol/L 134  136  138   Potassium 3.5 - 5.1 mmol/L 3.8  4.0  4.1   Chloride 98 - 111 mmol/L 105  103  104   CO2 22 - 32 mmol/L 22  22  23    Calcium 8.9 - 10.3 mg/dL 9.2  9.1  9.6   Total Protein 6.5 - 8.1 g/dL 7.0  7.1  7.4   Total Bilirubin 0.3 - 1.2 mg/dL 0.5  0.4  0.6   Alkaline Phos 38 - 126 U/L 53  83  87   AST 15 - 41 U/L 15  18  20    ALT 0 - 44 U/L 11  11  12      Iron/TIBC/Ferritin/ %Sat    Component Value Date/Time   IRON 82 11/23/2020 0953   TIBC 335 11/23/2020 0953   FERRITIN 221 11/23/2020 0953   IRONPCTSAT 25 11/23/2020 0953     RADIOGRAPHIC STUDIES: I have personally reviewed the radiological images as listed and agreed with the findings in the report. No results found.

## 2022-10-27 NOTE — Assessment & Plan Note (Signed)
Treatment plan as listed above. 

## 2022-10-27 NOTE — Assessment & Plan Note (Addendum)
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 #NGS showed BRCA 2 S1404, BRIP1 R173C 3X, FOXO1 1S399fs. TMB 3.1 mut/Mb, MS stable, PD-L1 1%  Genetic testing is positive for BRCA2  Eligard 45 mg Q6 months with his PCP at [facility] reviewed and discussed with patient.   PSA nadir 0.01. PMSA showed new bone lesions, s/p palliative radiation.  PSA 3.29 --> 4.72-->0.9 -->0.16 Labs are reviewed and discussed with patient. Continue Olarparib 250mg  BID [ dose reduced due to performance status].  [facility].

## 2022-10-27 NOTE — Assessment & Plan Note (Addendum)
Slight decreased hemoglobin.  Likely due to marrow side effects due to Olarparib.  Recommend to check iron tibc ferritin, folate, b12 at next visit.  Continue monitor

## 2022-10-27 NOTE — Assessment & Plan Note (Signed)
S/p palliative RT Discussed about bone strengthing agents. Sister and patient would like to defer for now.

## 2022-10-28 ENCOUNTER — Ambulatory Visit
Admission: RE | Admit: 2022-10-28 | Discharge: 2022-10-28 | Disposition: A | Discharge status: Home / Self Care | Payer: Medicare (Managed Care) | Source: Ambulatory Visit | Attending: Family Medicine | Admitting: Family Medicine

## 2022-10-28 DIAGNOSIS — Z13820 Encounter for screening for osteoporosis: Secondary | ICD-10-CM | POA: Insufficient documentation

## 2022-10-28 DIAGNOSIS — Z5181 Encounter for therapeutic drug level monitoring: Secondary | ICD-10-CM | POA: Insufficient documentation

## 2022-10-28 DIAGNOSIS — E559 Vitamin D deficiency, unspecified: Secondary | ICD-10-CM | POA: Diagnosis present

## 2022-10-28 DIAGNOSIS — C61 Malignant neoplasm of prostate: Secondary | ICD-10-CM | POA: Diagnosis not present

## 2022-10-28 DIAGNOSIS — Z0000 Encounter for general adult medical examination without abnormal findings: Secondary | ICD-10-CM

## 2022-10-28 DIAGNOSIS — M810 Age-related osteoporosis without current pathological fracture: Secondary | ICD-10-CM | POA: Diagnosis not present

## 2022-11-26 ENCOUNTER — Inpatient Hospital Stay: Payer: Medicare (Managed Care)

## 2022-11-26 ENCOUNTER — Other Ambulatory Visit: Payer: Self-pay

## 2022-11-26 ENCOUNTER — Emergency Department: Payer: Medicare (Managed Care)

## 2022-11-26 ENCOUNTER — Inpatient Hospital Stay
Admission: EM | Admit: 2022-11-26 | Discharge: 2022-11-26 | DRG: 092 | Disposition: A | Discharge status: Other Acute Inpatient Hospital | Payer: Medicare (Managed Care) | Attending: Internal Medicine | Admitting: Internal Medicine

## 2022-11-26 ENCOUNTER — Encounter: Payer: Self-pay | Admitting: Internal Medicine

## 2022-11-26 ENCOUNTER — Inpatient Hospital Stay (HOSPITAL_COMMUNITY)
Admission: RE | Admit: 2022-11-26 | Discharge: 2022-11-29 | DRG: 071 | Disposition: A | Discharge status: Skilled Nursing Facility | Payer: Medicare (Managed Care) | Attending: Internal Medicine | Admitting: Internal Medicine

## 2022-11-26 ENCOUNTER — Encounter (HOSPITAL_COMMUNITY): Payer: Self-pay | Admitting: Family Medicine

## 2022-11-26 ENCOUNTER — Encounter (HOSPITAL_COMMUNITY): Payer: Self-pay

## 2022-11-26 DIAGNOSIS — E119 Type 2 diabetes mellitus without complications: Secondary | ICD-10-CM | POA: Diagnosis present

## 2022-11-26 DIAGNOSIS — C7951 Secondary malignant neoplasm of bone: Secondary | ICD-10-CM | POA: Diagnosis present

## 2022-11-26 DIAGNOSIS — Z8249 Family history of ischemic heart disease and other diseases of the circulatory system: Secondary | ICD-10-CM

## 2022-11-26 DIAGNOSIS — G9340 Encephalopathy, unspecified: Secondary | ICD-10-CM | POA: Diagnosis present

## 2022-11-26 DIAGNOSIS — Z95810 Presence of automatic (implantable) cardiac defibrillator: Secondary | ICD-10-CM | POA: Diagnosis not present

## 2022-11-26 DIAGNOSIS — Z961 Presence of intraocular lens: Secondary | ICD-10-CM | POA: Diagnosis present

## 2022-11-26 DIAGNOSIS — I5022 Chronic systolic (congestive) heart failure: Secondary | ICD-10-CM | POA: Diagnosis present

## 2022-11-26 DIAGNOSIS — Z807 Family history of other malignant neoplasms of lymphoid, hematopoietic and related tissues: Secondary | ICD-10-CM | POA: Diagnosis not present

## 2022-11-26 DIAGNOSIS — W19XXXA Unspecified fall, initial encounter: Principal | ICD-10-CM | POA: Diagnosis present

## 2022-11-26 DIAGNOSIS — Z7984 Long term (current) use of oral hypoglycemic drugs: Secondary | ICD-10-CM

## 2022-11-26 DIAGNOSIS — C61 Malignant neoplasm of prostate: Secondary | ICD-10-CM | POA: Diagnosis present

## 2022-11-26 DIAGNOSIS — Z9841 Cataract extraction status, right eye: Secondary | ICD-10-CM

## 2022-11-26 DIAGNOSIS — Z811 Family history of alcohol abuse and dependence: Secondary | ICD-10-CM

## 2022-11-26 DIAGNOSIS — Z7982 Long term (current) use of aspirin: Secondary | ICD-10-CM

## 2022-11-26 DIAGNOSIS — I110 Hypertensive heart disease with heart failure: Secondary | ICD-10-CM | POA: Diagnosis present

## 2022-11-26 DIAGNOSIS — I429 Cardiomyopathy, unspecified: Secondary | ICD-10-CM | POA: Diagnosis present

## 2022-11-26 DIAGNOSIS — E872 Acidosis, unspecified: Secondary | ICD-10-CM | POA: Diagnosis present

## 2022-11-26 DIAGNOSIS — R29810 Facial weakness: Secondary | ICD-10-CM | POA: Diagnosis present

## 2022-11-26 DIAGNOSIS — Z6372 Alcoholism and drug addiction in family: Secondary | ICD-10-CM

## 2022-11-26 DIAGNOSIS — Z794 Long term (current) use of insulin: Secondary | ICD-10-CM

## 2022-11-26 DIAGNOSIS — Z87891 Personal history of nicotine dependence: Secondary | ICD-10-CM

## 2022-11-26 DIAGNOSIS — I639 Cerebral infarction, unspecified: Secondary | ICD-10-CM | POA: Diagnosis present

## 2022-11-26 DIAGNOSIS — Z833 Family history of diabetes mellitus: Secondary | ICD-10-CM

## 2022-11-26 DIAGNOSIS — R471 Dysarthria and anarthria: Secondary | ICD-10-CM | POA: Diagnosis not present

## 2022-11-26 DIAGNOSIS — Z8673 Personal history of transient ischemic attack (TIA), and cerebral infarction without residual deficits: Secondary | ICD-10-CM

## 2022-11-26 DIAGNOSIS — Z800 Family history of malignant neoplasm of digestive organs: Secondary | ICD-10-CM

## 2022-11-26 DIAGNOSIS — Z538 Procedure and treatment not carried out for other reasons: Secondary | ICD-10-CM | POA: Diagnosis present

## 2022-11-26 DIAGNOSIS — R2990 Unspecified symptoms and signs involving the nervous system: Secondary | ICD-10-CM | POA: Diagnosis not present

## 2022-11-26 DIAGNOSIS — Z1152 Encounter for screening for COVID-19: Secondary | ICD-10-CM | POA: Diagnosis not present

## 2022-11-26 DIAGNOSIS — R4182 Altered mental status, unspecified: Secondary | ICD-10-CM

## 2022-11-26 DIAGNOSIS — F0390 Unspecified dementia without behavioral disturbance: Secondary | ICD-10-CM | POA: Diagnosis present

## 2022-11-26 DIAGNOSIS — R569 Unspecified convulsions: Secondary | ICD-10-CM | POA: Diagnosis not present

## 2022-11-26 DIAGNOSIS — Z803 Family history of malignant neoplasm of breast: Secondary | ICD-10-CM | POA: Diagnosis not present

## 2022-11-26 DIAGNOSIS — G3184 Mild cognitive impairment, so stated: Secondary | ICD-10-CM | POA: Diagnosis present

## 2022-11-26 DIAGNOSIS — Z79899 Other long term (current) drug therapy: Secondary | ICD-10-CM | POA: Diagnosis not present

## 2022-11-26 DIAGNOSIS — Z9181 History of falling: Secondary | ICD-10-CM

## 2022-11-26 DIAGNOSIS — Z79818 Long term (current) use of other agents affecting estrogen receptors and estrogen levels: Secondary | ICD-10-CM

## 2022-11-26 DIAGNOSIS — I6522 Occlusion and stenosis of left carotid artery: Secondary | ICD-10-CM | POA: Diagnosis not present

## 2022-11-26 DIAGNOSIS — R4781 Slurred speech: Secondary | ICD-10-CM | POA: Diagnosis present

## 2022-11-26 DIAGNOSIS — Z9842 Cataract extraction status, left eye: Secondary | ICD-10-CM

## 2022-11-26 DIAGNOSIS — E1165 Type 2 diabetes mellitus with hyperglycemia: Secondary | ICD-10-CM | POA: Diagnosis not present

## 2022-11-26 DIAGNOSIS — Z8546 Personal history of malignant neoplasm of prostate: Secondary | ICD-10-CM

## 2022-11-26 LAB — URINALYSIS, W/ REFLEX TO CULTURE (INFECTION SUSPECTED)
Bacteria, UA: NONE SEEN
Bilirubin Urine: NEGATIVE
Glucose, UA: 50 mg/dL — AB
Hgb urine dipstick: NEGATIVE
Ketones, ur: NEGATIVE mg/dL
Leukocytes,Ua: NEGATIVE
Nitrite: NEGATIVE
Protein, ur: NEGATIVE mg/dL
Specific Gravity, Urine: 1.01 (ref 1.005–1.030)
Squamous Epithelial / HPF: 0 /HPF (ref 0–5)
pH: 5 (ref 5.0–8.0)

## 2022-11-26 LAB — CBC WITH DIFFERENTIAL/PLATELET
Abs Immature Granulocytes: 0.01 10*3/uL (ref 0.00–0.07)
Basophils Absolute: 0 10*3/uL (ref 0.0–0.1)
Basophils Relative: 0 %
Eosinophils Absolute: 0 10*3/uL (ref 0.0–0.5)
Eosinophils Relative: 1 %
HCT: 36.4 % — ABNORMAL LOW (ref 39.0–52.0)
Hemoglobin: 11.8 g/dL — ABNORMAL LOW (ref 13.0–17.0)
Immature Granulocytes: 0 %
Lymphocytes Relative: 9 %
Lymphs Abs: 0.4 10*3/uL — ABNORMAL LOW (ref 0.7–4.0)
MCH: 32.7 pg (ref 26.0–34.0)
MCHC: 32.4 g/dL (ref 30.0–36.0)
MCV: 100.8 fL — ABNORMAL HIGH (ref 80.0–100.0)
Monocytes Absolute: 0.3 10*3/uL (ref 0.1–1.0)
Monocytes Relative: 6 %
Neutro Abs: 4.1 10*3/uL (ref 1.7–7.7)
Neutrophils Relative %: 84 %
Platelets: 184 10*3/uL (ref 150–400)
RBC: 3.61 MIL/uL — ABNORMAL LOW (ref 4.22–5.81)
RDW: 15.4 % (ref 11.5–15.5)
WBC: 4.9 10*3/uL (ref 4.0–10.5)
nRBC: 0.4 % — ABNORMAL HIGH (ref 0.0–0.2)

## 2022-11-26 LAB — COMPREHENSIVE METABOLIC PANEL
ALT: 12 U/L (ref 0–44)
AST: 38 U/L (ref 15–41)
Albumin: 4 g/dL (ref 3.5–5.0)
Alkaline Phosphatase: 63 U/L (ref 38–126)
Anion gap: 10 (ref 5–15)
BUN: 19 mg/dL (ref 8–23)
CO2: 22 mmol/L (ref 22–32)
Calcium: 9.4 mg/dL (ref 8.9–10.3)
Chloride: 102 mmol/L (ref 98–111)
Creatinine, Ser: 0.77 mg/dL (ref 0.61–1.24)
GFR, Estimated: >60 mL/min (ref >60)
Glucose, Bld: 141 mg/dL — ABNORMAL HIGH (ref 70–99)
Potassium: 5.1 mmol/L (ref 3.5–5.1)
Sodium: 134 mmol/L — ABNORMAL LOW (ref 135–145)
Total Bilirubin: 1.6 mg/dL — ABNORMAL HIGH (ref 0.3–1.2)
Total Protein: 8.1 g/dL (ref 6.5–8.1)

## 2022-11-26 LAB — COOXEMETRY PANEL
Carboxyhemoglobin: 0.9 % (ref 0.5–1.5)
Methemoglobin: <0.7 % (ref 0.0–1.5)
O2 Saturation: 37.1 %
Total hemoglobin: 12 g/dL (ref 12.0–16.0)
Total oxygen content: 36.6 %

## 2022-11-26 LAB — BLOOD GAS, VENOUS
Acid-Base Excess: 0.3 mmol/L (ref 0.0–2.0)
Bicarbonate: 26 mmol/L (ref 20.0–28.0)
O2 Saturation: 36.6 %
Patient temperature: 37
pCO2, Ven: 45 mmHg (ref 44–60)
pH, Ven: 7.37 (ref 7.25–7.43)

## 2022-11-26 LAB — TSH: TSH: 1.728 u[IU]/mL (ref 0.350–4.500)

## 2022-11-26 LAB — CBG MONITORING, ED: Glucose-Capillary: 207 mg/dL — ABNORMAL HIGH (ref 70–99)

## 2022-11-26 LAB — PROTIME-INR
INR: 1 (ref 0.8–1.2)
Prothrombin Time: 13.4 seconds (ref 11.4–15.2)

## 2022-11-26 LAB — GLUCOSE, CAPILLARY: Glucose-Capillary: 131 mg/dL — ABNORMAL HIGH (ref 70–99)

## 2022-11-26 LAB — LACTIC ACID, PLASMA: Lactic Acid, Venous: 3.1 mmol/L (ref 0.5–1.9)

## 2022-11-26 LAB — APTT: aPTT: 27 seconds (ref 24–36)

## 2022-11-26 LAB — CK: Total CK: 177 U/L (ref 49–397)

## 2022-11-26 LAB — SARS CORONAVIRUS 2 BY RT PCR: SARS Coronavirus 2 by RT PCR: NEGATIVE

## 2022-11-26 MED ORDER — SODIUM CHLORIDE 0.9 % IV BOLUS
1000.0000 mL | Freq: Once | INTRAVENOUS | Status: AC
  Administered 2022-11-26: 1000 mL via INTRAVENOUS

## 2022-11-26 MED ORDER — ASPIRIN 81 MG PO CHEW
324.0000 mg | CHEWABLE_TABLET | Freq: Once | ORAL | Status: AC
  Administered 2022-11-26: 324 mg via ORAL
  Filled 2022-11-26: qty 4

## 2022-11-26 MED ORDER — ACETAMINOPHEN 160 MG/5ML PO SOLN: 650.0000 mg | ORAL | Status: DC | PRN

## 2022-11-26 MED ORDER — ACETAMINOPHEN 500 MG PO TABS: 1000.0000 mg | ORAL_TABLET | Freq: Two times a day (BID) | ORAL | Status: DC

## 2022-11-26 MED ORDER — SODIUM CHLORIDE 0.9 % IV BOLUS
500.0000 mL | Freq: Once | INTRAVENOUS | Status: AC
  Administered 2022-11-26: 500 mL via INTRAVENOUS

## 2022-11-26 MED ORDER — SODIUM CHLORIDE 0.9% FLUSH: 3.0000 mL | INTRAVENOUS | Status: DC | PRN

## 2022-11-26 MED ORDER — TAMSULOSIN HCL 0.4 MG PO CAPS
0.4000 mg | ORAL_CAPSULE | Freq: Every day | ORAL | Status: DC
  Administered 2022-11-27 – 2022-11-29 (×3): 0.4 mg via ORAL
  Filled 2022-11-26 (×3): qty 1

## 2022-11-26 MED ORDER — STROKE: EARLY STAGES OF RECOVERY BOOK
Freq: Once | Status: AC
  Filled 2022-11-26: qty 1

## 2022-11-26 MED ORDER — SODIUM CHLORIDE 0.9% FLUSH
3.0000 mL | Freq: Two times a day (BID) | INTRAVENOUS | Status: DC
  Administered 2022-11-26 – 2022-11-29 (×6): 3 mL via INTRAVENOUS

## 2022-11-26 MED ORDER — INSULIN ASPART 100 UNIT/ML IJ SOLN
0.0000 [IU] | Freq: Three times a day (TID) | INTRAMUSCULAR | Status: DC
  Administered 2022-11-26: 3 [IU] via SUBCUTANEOUS
  Filled 2022-11-26: qty 1

## 2022-11-26 MED ORDER — ENOXAPARIN SODIUM 40 MG/0.4ML IJ SOSY: 40.0000 mg | PREFILLED_SYRINGE | INTRAMUSCULAR | Status: DC

## 2022-11-26 MED ORDER — CARVEDILOL 3.125 MG PO TABS
3.1250 mg | ORAL_TABLET | Freq: Two times a day (BID) | ORAL | Status: DC
  Administered 2022-11-27 – 2022-11-29 (×5): 3.125 mg via ORAL
  Filled 2022-11-26 (×5): qty 1

## 2022-11-26 MED ORDER — HYDRALAZINE HCL 20 MG/ML IJ SOLN: 5.0000 mg | Freq: Four times a day (QID) | INTRAMUSCULAR | Status: DC | PRN

## 2022-11-26 MED ORDER — OLAPARIB 150 MG PO TABS: 150.0000 mg | ORAL_TABLET | Freq: Two times a day (BID) | ORAL | Status: DC

## 2022-11-26 MED ORDER — PANTOPRAZOLE SODIUM 40 MG PO TBEC
40.0000 mg | DELAYED_RELEASE_TABLET | Freq: Every day | ORAL | Status: DC
  Administered 2022-11-27 – 2022-11-29 (×3): 40 mg via ORAL
  Filled 2022-11-26 (×3): qty 1

## 2022-11-26 MED ORDER — SENNA 8.6 MG PO TABS
2.0000 | ORAL_TABLET | Freq: Every day | ORAL | Status: DC
  Administered 2022-11-27 – 2022-11-29 (×2): 17.2 mg via ORAL
  Filled 2022-11-26 (×3): qty 2

## 2022-11-26 MED ORDER — STROKE: EARLY STAGES OF RECOVERY BOOK: Freq: Once | Status: DC

## 2022-11-26 MED ORDER — SENNOSIDES-DOCUSATE SODIUM 8.6-50 MG PO TABS: 1.0000 | ORAL_TABLET | Freq: Every evening | ORAL | Status: DC | PRN

## 2022-11-26 MED ORDER — INSULIN ASPART 100 UNIT/ML IJ SOLN
0.0000 [IU] | Freq: Every day | INTRAMUSCULAR | Status: DC
  Administered 2022-11-27: 2 [IU] via SUBCUTANEOUS
  Administered 2022-11-28: 3 [IU] via SUBCUTANEOUS

## 2022-11-26 MED ORDER — ASPIRIN 81 MG PO TBEC: 81.0000 mg | DELAYED_RELEASE_TABLET | Freq: Every day | ORAL | Status: DC

## 2022-11-26 MED ORDER — ASPIRIN 81 MG PO TBEC
81.0000 mg | DELAYED_RELEASE_TABLET | Freq: Every day | ORAL | Status: DC
  Administered 2022-11-27 – 2022-11-29 (×3): 81 mg via ORAL
  Filled 2022-11-26 (×3): qty 1

## 2022-11-26 MED ORDER — DAROLUTAMIDE 300 MG PO TABS: 600.0000 mg | ORAL_TABLET | Freq: Two times a day (BID) | ORAL | Status: DC

## 2022-11-26 MED ORDER — ACETAMINOPHEN 650 MG RE SUPP: 650.0000 mg | RECTAL | Status: DC | PRN

## 2022-11-26 MED ORDER — IOHEXOL 350 MG/ML SOLN
75.0000 mL | Freq: Once | INTRAVENOUS | Status: AC | PRN
  Administered 2022-11-26: 75 mL via INTRAVENOUS

## 2022-11-26 MED ORDER — SPIRONOLACTONE 12.5 MG HALF TABLET
12.5000 mg | ORAL_TABLET | Freq: Every day | ORAL | Status: DC
  Administered 2022-11-27 – 2022-11-29 (×3): 12.5 mg via ORAL
  Filled 2022-11-26 (×3): qty 1

## 2022-11-26 MED ORDER — SODIUM CHLORIDE 0.9 % IV SOLN: INTRAVENOUS | Status: DC

## 2022-11-26 MED ORDER — ACETAMINOPHEN 325 MG PO TABS: 650.0000 mg | ORAL_TABLET | ORAL | Status: DC | PRN

## 2022-11-26 MED ORDER — TAMSULOSIN HCL 0.4 MG PO CAPS: 0.4000 mg | ORAL_CAPSULE | Freq: Every day | ORAL | Status: DC

## 2022-11-26 MED ORDER — ENOXAPARIN SODIUM 40 MG/0.4ML IJ SOSY
40.0000 mg | PREFILLED_SYRINGE | INTRAMUSCULAR | Status: DC
  Administered 2022-11-26 – 2022-11-28 (×3): 40 mg via SUBCUTANEOUS
  Filled 2022-11-26 (×3): qty 0.4

## 2022-11-26 MED ORDER — CLOPIDOGREL BISULFATE 75 MG PO TABS
75.0000 mg | ORAL_TABLET | Freq: Every day | ORAL | Status: DC
  Administered 2022-11-26: 75 mg via ORAL
  Filled 2022-11-26: qty 1

## 2022-11-26 MED ORDER — LORAZEPAM 2 MG/ML IJ SOLN: 0.5000 mg | INTRAMUSCULAR | Status: DC | PRN

## 2022-11-26 MED ORDER — INSULIN ASPART 100 UNIT/ML IJ SOLN
0.0000 [IU] | Freq: Three times a day (TID) | INTRAMUSCULAR | Status: DC
  Administered 2022-11-27: 1 [IU] via SUBCUTANEOUS
  Administered 2022-11-27 – 2022-11-28 (×2): 2 [IU] via SUBCUTANEOUS
  Administered 2022-11-28: 3 [IU] via SUBCUTANEOUS
  Administered 2022-11-29 (×2): 1 [IU] via SUBCUTANEOUS

## 2022-11-26 MED ORDER — SODIUM CHLORIDE 0.9 % IV SOLN: 250.0000 mL | INTRAVENOUS | Status: DC | PRN

## 2022-11-26 MED ORDER — ACETAMINOPHEN 325 MG RE SUPP: 650.0000 mg | RECTAL | Status: DC | PRN

## 2022-11-26 NOTE — H&P (Signed)
History and Physical    Matthew Mcgrath JWJ:[id number] DOB: 1945/07/31 DOA: 11/26/2022  PCP: [facility] [employer] (Confirm with patient/family/NH records and if not entered, this has to be entered at [facility] point of entry) Patient coming from: Home  I have personally briefly reviewed patient's old medical records in [facility]  Chief Complaint: Right facial droop, confusion and fall  HPI: Matthew Mcgrath is a 77 y.o. male with medical history significant of chronic HFrEF with LVEF 45-50%, cardiomyopathy status post AICD/pacemaker, HTN, IIDM, metastatic prostate cancer on chemotherapy presented to ED with altered mentations.  According to family, patient lives by himself, his baseline function is poor with chronic right leg weakness and ambulation impairment on wheelchair/roller walker, and patient was last seen normal today before yesterday.  This morning, patient triggered his wrist alarm button.  And family went to see him and found the patient on the floor confused, sister so patient appears to have a new right-sided facial droop and brother reported that the patient speech was slow and slurred.  EMS was called.  Patient however only remembered that he fell down yesterday but could not tell more about it.  Patient denied any headache arm or leg pain or joint pain denies any numbness of any of the limbs.  ED Course: Afebrile, borderline tachycardia, blood pressure slightly elevated 150/90, O2 saturation 98% on room air.  CT head showed new left cortical stroke compared to the MRI in April of this year.  CT neck negative for fracture or dislocation.  Blood work showed WBC 4.9, K5.1, creatinine 0.9, VBG showed 7.3 7/45/36  Review of Systems: Unable to perform, patient somewhat confused.  Past Medical History:  Diagnosis Date   Cardiomyopathy (HCC)    Congestive heart failure (HCC)    Diabetes mellitus type 2, controlled (HCC)    Elevated lipids    Family history of breast cancer     Family history of colon cancer    HTN (hypertension)     Past Surgical History:  Procedure Laterality Date   CARDIAC CATHETERIZATION N/A 04/14/2015   Procedure: Left Heart Cath and Coronary Angiography;  Surgeon: Marcina Millard, MD;  Location: [facility] INVASIVE CV LAB;  Service: Cardiovascular;  Laterality: N/A;   CATARACT EXTRACTION W/ INTRAOCULAR LENS  IMPLANT, BILATERAL Left    COLONOSCOPY WITH PROPOFOL N/A 04/08/2020   Procedure: COLONOSCOPY WITH PROPOFOL;  Surgeon: Toledo, Boykin Nearing, MD;  Location: [facility] ENDOSCOPY;  Service: Gastroenterology;  Laterality: N/A;   CYSTOSCOPY W/ RETROGRADES Bilateral 07/02/2021   Procedure: CYSTOSCOPY WITH RETROGRADE PYELOGRAM;  Surgeon: Sondra Come, MD;  Location: [facility] ORS;  Service: Urology;  Laterality: Bilateral;   HERNIA REPAIR     IMPLANTABLE CARDIOVERTER DEFIBRILLATOR (ICD) GENERATOR CHANGE Left 07/03/2015   Procedure: ICD IMPLANT single chamber;  Surgeon: Sharion Settler, MD;  Location: [facility] ORS;  Service: Cardiovascular;  Laterality: Left;   TRANSURETHRAL RESECTION OF BLADDER TUMOR N/A 07/02/2021   Procedure: TRANSURETHRAL RESECTION OF BLADDER TUMOR (TURBT);  Surgeon: Sondra Come, MD;  Location: [facility] ORS;  Service: Urology;  Laterality: N/A;     reports that he quit smoking about 2 years ago. His smoking use included cigarettes. He started smoking about 32 years ago. He has a 7.5 pack-year smoking history. He has been exposed to tobacco smoke. He has never used smokeless tobacco. He reports that he does not currently use drugs after having used the following drugs: Marijuana. Frequency: 2.00 times per week. He reports that he does not  drink alcohol.  No Known Allergies  Family History  Problem Relation Age of Onset   Heart failure Mother    Heart disease Mother    Alcoholism Father    Hypertension Sister    Diabetes Sister    Hypertension Brother    Diabetes Brother    Colon cancer Brother        vs prostate   Multiple myeloma  Brother    Breast cancer Cousin      Prior to Admission medications   Medication Sig Start Date End Date Taking? Authorizing Provider  acetaminophen (TYLENOL) 650 MG CR tablet SMARTSIG:2 Tablet(s) By Mouth Twice Daily 10/06/22   [provider]  ASPIRIN LOW DOSE 81 MG tablet Take 81 mg by mouth daily. 10/05/21   [provider]  carvedilol (COREG) 3.125 MG tablet Take 3.125 mg by mouth 2 (two) times daily. 10/05/21   [provider]  cholecalciferol (VITAMIN D3) 25 MCG (1000 UNIT) tablet Take 1,000 Units by mouth daily.    [provider]  darolutamide (NUBEQA) 300 MG tablet Take 600 mg by mouth 2 (two) times daily with a meal.    [provider]  glucose blood (TRUE METRIX BLOOD GLUCOSE TEST) test strip 1 each by Other route as needed for other. Use as instructed    [provider]  GLUMETZA 1000 MG 24 hr tablet Take 1,000 mg by mouth daily. 06/05/21   [provider]  JARDIANCE 10 MG TABS tablet Take 10 mg by mouth daily. 03/07/21   [provider]  LEUPROLIDE ACETATE, 6 MONTH, 45 MG injection Inject into the skin. 06/16/21   [provider]  LIDODERM 5 % Apply 1 patch to skin every morning as needed for L lower back pain. Remove after 12 hours. Call for refills 05/06/20   [provider]  lisinopril (ZESTRIL) 2.5 MG tablet Take 2.5 mg by mouth daily. 06/05/21   [provider]  LYNPARZA 100 MG tablet Take by mouth. Take with 150mg  for a total of 250mg  07/01/22   [provider]  LYNPARZA 150 MG tablet Take by mouth. Take along with 100mg  for total of 250mg  07/01/22   [provider]  magnesium oxide (MAG-OX) 400 (240 Mg) MG tablet Take 1 tablet by mouth 2 (two) times daily. 06/05/21   [provider]  metFORMIN (GLUCOPHAGE-XR) 500 MG 24 hr tablet Take 500 mg by mouth every evening.    [provider]  Multiple Vitamin (ONE-DAILY MULTI-VITAMIN) TABS Take 1 tablet by mouth daily.  10/05/21   [provider]  Omega-3 Fatty Acids (OMEGA III EPA+DHA) 1000 MG CAPS Take 1 capsule by mouth 2 (two) times daily. 06/05/21   [provider]  spironolactone (ALDACTONE) 25 MG tablet Take 0.5 tablets (12.5 mg total) by mouth daily. 06/21/21   Alford Highland, MD  tamsulosin (FLOMAX) 0.4 MG CAPS capsule Take 1 capsule (0.4 mg total) by mouth daily. 02/04/21   Sondra Come, MD  TRUEplus Lancets 28G MISC by Does not apply route.    [provider]    Physical Exam: Vitals:   11/26/22 0923 11/26/22 0924 11/26/22 1000 11/26/22 1030  BP: (!) 154/94  (!) 138/94 (!) 145/94  Pulse: 100  90 83  Resp: (!) 22  17 (!) 21  Temp:  97.7 F (36.5 C)    TempSrc:  Oral    SpO2: 98%  94% 96%    Constitutional: NAD, calm, comfortable Vitals:   11/26/22 0923 11/26/22  1610 11/26/22 1000 11/26/22 1030  BP: (!) 154/94  (!) 138/94 (!) 145/94  Pulse: 100  90 83  Resp: (!) 22  17 (!) 21  Temp:  97.7 F (36.5 C)    TempSrc:  Oral    SpO2: 98%  94% 96%   Eyes: PERRL, lids and conjunctivae normal ENMT: Mucous membranes are moist. Posterior pharynx clear of any exudate or lesions.Normal dentition.  Neck: normal, supple, no masses, no thyromegaly Respiratory: clear to auscultation bilaterally, no wheezing, no crackles. Normal respiratory effort. No accessory muscle use.  Cardiovascular: Regular rate and rhythm, no murmurs / rubs / gallops. No extremity edema. 2+ pedal pulses. No carotid bruits.  Abdomen: no tenderness, no masses palpated. No hepatosplenomegaly. Bowel sounds positive.  Musculoskeletal: no clubbing / cyanosis. No joint deformity upper and lower extremities. Good ROM, no contractures. Normal muscle tone.  Skin: no rashes, lesions, ulcers. No induration Neurologic: CN 2-12 grossly intact. Sensation intact, DTR normal.  Chronic right leg weakness as reported by family Psychiatric: Awake, oriented to person and place, confused about time    Labs on Admission:  I have personally reviewed following labs and imaging studies  CBC: Recent Labs  Lab 11/26/22 0932  WBC 4.9  NEUTROABS 4.1  HGB 11.8*  HCT 36.4*  MCV 100.8*  PLT 184   Basic Metabolic Panel: Recent Labs  Lab 11/26/22 0932  NA 134*  K 5.1  CL 102  CO2 22  GLUCOSE 141*  BUN 19  CREATININE 0.77  CALCIUM 9.4   GFR: CrCl cannot be calculated (Unknown ideal weight.). Liver Function Tests: Recent Labs  Lab 11/26/22 0932  AST 38  ALT 12  ALKPHOS 63  BILITOT 1.6*  PROT 8.1  ALBUMIN 4.0   No results for input(s): "LIPASE", "AMYLASE" in the last 168 hours. No results for input(s): "AMMONIA" in the last 168 hours. Coagulation Profile: Recent Labs  Lab 11/26/22 0932  INR 1.0   Cardiac Enzymes: Recent Labs  Lab 11/26/22 0953  CKTOTAL 177   BNP (last 3 results) No results for input(s): "PROBNP" in the last 8760 hours. HbA1C: No results for input(s): "HGBA1C" in the last 72 hours. CBG: No results for input(s): "GLUCAP" in the last 168 hours. Lipid Profile: No results for input(s): "CHOL", "HDL", "LDLCALC", "TRIG", "CHOLHDL", "LDLDIRECT" in the last 72 hours. Thyroid Function Tests: No results for input(s): "TSH", "T4TOTAL", "FREET4", "T3FREE", "THYROIDAB" in the last 72 hours. Anemia Panel: No results for input(s): "VITAMINB12", "FOLATE", "FERRITIN", "TIBC", "IRON", "RETICCTPCT" in the last 72 hours. Urine analysis:    Component Value Date/Time   COLORURINE STRAW (A) 11/26/2022 1008   APPEARANCEUR CLEAR (A) 11/26/2022 1008   APPEARANCEUR Clear 02/23/2022 1000   LABSPEC 1.010 11/26/2022 1008   PHURINE 5.0 11/26/2022 1008   GLUCOSEU 50 (A) 11/26/2022 1008   HGBUR NEGATIVE 11/26/2022 1008   BILIRUBINUR NEGATIVE 11/26/2022 1008   BILIRUBINUR Negative 02/23/2022 1000   KETONESUR NEGATIVE 11/26/2022 1008   PROTEINUR NEGATIVE 11/26/2022 1008   NITRITE NEGATIVE 11/26/2022 1008   LEUKOCYTESUR NEGATIVE 11/26/2022 1008    Radiological Exams on Admission: CT  Head Wo Contrast  Result Date: 11/26/2022 CLINICAL DATA:  Head neck trauma. EXAM: CT HEAD WITHOUT CONTRAST CT CERVICAL SPINE WITHOUT CONTRAST TECHNIQUE: Multidetector CT imaging of the head and cervical spine was performed following the standard protocol without intravenous contrast. Multiplanar CT image reconstructions of the cervical spine were also generated. RADIATION DOSE REDUCTION: This exam was performed according to the departmental dose-optimization program which includes  automated exposure control, adjustment of the mA and/or kV according to patient size and/or use of iterative reconstruction technique. COMPARISON:  None Available. FINDINGS: CT HEAD FINDINGS Brain: No hemorrhage, hydrocephalus, or masslike finding. Extensive chronic small vessel ischemic gliosis in the cerebral white matter. There is an infarct in the lateral left frontal cortex which appears recent and is new from 07/01/2022. Vascular: No hyperdense vessel or unexpected calcification. Skull: Normal. Negative for fracture or focal lesion. Sinuses/Orbits: No acute finding. CT CERVICAL SPINE FINDINGS Alignment: Normal. Skull base and vertebrae: No acute fracture. Sclerotic lesion in the left half of the C3 body, known metastatic prostate cancer. Soft tissues and spinal canal: No acute finding Disc levels:  Mild for age degenerative endplate spurring. Upper chest: Negative IMPRESSION: 1. 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  Known osseous metastatic disease. Electronically Signed   By: Tiburcio Pea M.D.   On: 11/26/2022 10:12   CT Cervical Spine Wo Contrast  Result Date: 11/26/2022 CLINICAL DATA:  Head neck trauma. EXAM: CT HEAD WITHOUT CONTRAST CT CERVICAL SPINE WITHOUT CONTRAST TECHNIQUE: Multidetector CT imaging of the head and cervical spine was performed following the standard protocol without intravenous contrast. Multiplanar CT image reconstructions of the cervical spine were also generated.  RADIATION DOSE REDUCTION: This exam was performed according to the departmental dose-optimization program which includes automated exposure control, adjustment of the mA and/or kV according to patient size and/or use of iterative reconstruction technique. COMPARISON:  None Available. FINDINGS: CT HEAD FINDINGS Brain: No hemorrhage, hydrocephalus, or masslike finding. Extensive chronic small vessel ischemic gliosis in the cerebral white matter. There is an infarct in the lateral left frontal cortex which appears recent and is new from 07/01/2022. Vascular: No hyperdense vessel or unexpected calcification. Skull: Normal. Negative for fracture or focal lesion. Sinuses/Orbits: No acute finding. CT CERVICAL SPINE FINDINGS Alignment: Normal. Skull base and vertebrae: No acute fracture. Sclerotic lesion in the left half of the C3 body, known metastatic prostate cancer. Soft tissues and spinal canal: No acute finding Disc levels:  Mild for age degenerative endplate spurring. Upper chest: Negative IMPRESSION: 1. 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  Known osseous metastatic disease. Electronically Signed   By: Tiburcio Pea M.D.   On: 11/26/2022 10:12   DG Chest Port 1 View  Result Date: 11/26/2022 CLINICAL DATA:  77 year old male with history of trauma from a fall. Sepsis. EXAM: PORTABLE CHEST 1 VIEW COMPARISON:  Chest x-ray 06/18/2021. FINDINGS: Low lung volumes. Linear areas of architectural distortion in the left lower lung, similar to prior study from 06/18/2021, presumably chronic scarring. No acute consolidative airspace disease. No pleural effusions. No pneumothorax. No evidence of pulmonary edema. Heart size is borderline enlarged. The patient is rotated to the left on today's exam, resulting in distortion of the mediastinal contours and reduced diagnostic sensitivity and specificity for mediastinal pathology. Atherosclerotic calcifications are noted in the thoracic aorta.  Left-sided pacemaker/AICD with lead tip projecting over the expected location of the right ventricular apex. IMPRESSION: 1. Low lung volumes without radiographic evidence of acute cardiopulmonary disease. 2. Aortic atherosclerosis. Electronically Signed   By: Trudie Reed M.D.   On: 11/26/2022 09:46    EKG: Independently reviewed.  Sinus, with occasional PVCs  Assessment/Plan Principal Problem:   Stroke [facility]) Active Problems:   Stroke (cerebrum) (HCC)  (please populate well all problems here in Problem List. (For example, if patient is on BP meds at home and you resume or decide  to hold them, it is a problem that needs to be her. Same for CAD, COPD, HLD and so on)  Acute encephalopathy -Etiology less clear -Discussed with on-call neurology Dr. Wilford Corner, recommend MRI with and without contrast, EEG.   -Check B12 and TSH -UA showed no UTI and chest x-ray is clear no infiltrates.  Stroke -New left cortical stroke compared to previous MRI study in April of this year however duration unknown. -Discussed with neurology, recommended hold off Plavix and continue aspirin until neurology workup completed. -Allow permissive hypertension, hold off home BP meds, start as needed hydralazine -Brain MRI, with and without contrast, CT angiogram head and neck -PT OT evaluation  Question of syncope and fall -Check orthostatic vital signs -Echocardiogram ordered -Telemonitoring -Communicated with ED nurse at [facility] about interrogation of AICD/PPM.  IIDM -Check A1c -SSI, hold off metformin and Farxiga  Prostate CA -Outpatient follow-up with oncology   DVT prophylaxis: Lovenox Code Status: Full code Family Communication: Brother and sister at bedside Disposition Plan: Patient is sick with stroke and encephalopathy, requiring inpatient neurology workup and inpatient MRI, expect more than 2 midnight hospital stay Consults called: Neurology Admission status: Telemetry admission   Emeline General  MD [facility] Pager [phone]  11/26/2022, 12:34 PM

## 2022-11-26 NOTE — Progress Notes (Signed)
PT Cancellation Note  Patient Details Name: Matthew Mcgrath MRN: [id number] DOB: 1945-06-19   Cancelled Treatment:    Reason Eval/Treat Not Completed: Other (comment) (PT order received and chart reviewed. Patient transferring to [facility] for MRI since he has a AICD and [facility] cannot accomodate that during MRI. No longer in need of PT at [facility]. Will complete PT order.)   Luretha Murphy. Ilsa Iha, PT, DPT 11/26/22, 4:19 PM

## 2022-11-26 NOTE — H&P (Signed)
History and Physical    Matthew Mcgrath YQM:[id number] DOB: [birth year] DOA: 11/26/2022  PCP: [facility] [employer]   Patient coming from: Home   Chief Complaint: Found down, facial droop, dysarthria   HPI: Matthew Mcgrath is a 77 y.o. male with medical history significant for type 2 diabetes mellitus, hypertension, cardiomyopathy with ICD, prostate cancer metastatic to bone, and chronic right lower extremity weakness who presented to the ED after a fall sometime yesterday afternoon or evening.  Patient lives alone, uses a wheelchair or walker at baseline, and was found on the floor today with confusion, slurred speech, and facial droop noted by family.  Patient stated that he fell yesterday but has trouble recalling the details surrounding it.  He was too weak to get up on his own.   He denies chest pain, fevers, headache, or any new focal numbness or weakness.  [facility] ED Course: Upon arrival to the ED, patient is found to be afebrile and saturating well on room air with normal heart rate and stable blood pressure.  EKG demonstrates sinus rhythm.  Chest x-ray is negative for acute cardiopulmonary disease.  Head CT is notable for left frontal cortex infarction that is new from the MRI in April 2024.  Patient was evaluated by neurology and medical admission to [facility] was recommended as MRI cannot be performed at [facility] due to the patient's ICD.  Review of Systems:  All other systems reviewed and apart from HPI, are negative.  Past Medical History:  Diagnosis Date   Cardiomyopathy (HCC)    Congestive heart failure (HCC)    Diabetes mellitus type 2, controlled (HCC)    Elevated lipids    Family history of breast cancer    Family history of colon cancer    HTN (hypertension)     Past Surgical History:  Procedure Laterality Date   CARDIAC CATHETERIZATION N/A 04/14/2015   Procedure: Left Heart Cath and Coronary Angiography;  Surgeon: Marcina Millard, MD;  Location: [facility]  INVASIVE CV LAB;  Service: Cardiovascular;  Laterality: N/A;   CATARACT EXTRACTION W/ INTRAOCULAR LENS  IMPLANT, BILATERAL Left    COLONOSCOPY WITH PROPOFOL N/A 04/08/2020   Procedure: COLONOSCOPY WITH PROPOFOL;  Surgeon: Toledo, Boykin Nearing, MD;  Location: [facility] ENDOSCOPY;  Service: Gastroenterology;  Laterality: N/A;   CYSTOSCOPY W/ RETROGRADES Bilateral 07/02/2021   Procedure: CYSTOSCOPY WITH RETROGRADE PYELOGRAM;  Surgeon: Sondra Come, MD;  Location: [facility] ORS;  Service: Urology;  Laterality: Bilateral;   HERNIA REPAIR     IMPLANTABLE CARDIOVERTER DEFIBRILLATOR (ICD) GENERATOR CHANGE Left 07/03/2015   Procedure: ICD IMPLANT single chamber;  Surgeon: Sharion Settler, MD;  Location: [facility] ORS;  Service: Cardiovascular;  Laterality: Left;   TRANSURETHRAL RESECTION OF BLADDER TUMOR N/A 07/02/2021   Procedure: TRANSURETHRAL RESECTION OF BLADDER TUMOR (TURBT);  Surgeon: Sondra Come, MD;  Location: [facility] ORS;  Service: Urology;  Laterality: N/A;    Social History:   reports that he quit smoking about 2 years ago. His smoking use included cigarettes. He started smoking about 32 years ago. He has a 7.5 pack-year smoking history. He has been exposed to tobacco smoke. He has never used smokeless tobacco. He reports that he does not currently use drugs after having used the following drugs: Marijuana. Frequency: 2.00 times per week. He reports that he does not drink alcohol.  No Known Allergies  Family History  Problem Relation Age of Onset   Heart failure Mother    Heart disease Mother  Alcoholism Father    Hypertension Sister    Diabetes Sister    Hypertension Brother    Diabetes Brother    Colon cancer Brother        vs prostate   Multiple myeloma Brother    Breast cancer Cousin      Prior to Admission medications   Medication Sig Start Date End Date Taking? Authorizing Provider  acetaminophen (TYLENOL) 650 MG CR tablet SMARTSIG:2 Tablet(s) By Mouth Twice Daily 10/06/22   [provider]  ASPIRIN LOW DOSE 81 MG tablet Take 81 mg by mouth daily. 10/05/21   [provider]  carvedilol (COREG) 3.125 MG tablet Take 3.125 mg by mouth 2 (two) times daily. 10/05/21   [provider]  cholecalciferol (VITAMIN D3) 25 MCG (1000 UNIT) tablet Take 1,000 Units by mouth daily.    [provider]  GLUMETZA 1000 MG 24 hr tablet Take 1,000 mg by mouth daily. 06/05/21   [provider]  LEUPROLIDE ACETATE, 6 MONTH, 45 MG injection Inject into the skin. 06/16/21   [provider]  LYNPARZA 100 MG tablet Take by mouth. Take with 150mg  for a total of 250mg  07/01/22   [provider]  LYNPARZA 150 MG tablet Take by mouth. Take along with 100mg  for total of 250mg  07/01/22   [provider]  magnesium oxide (MAG-OX) 400 (240 Mg) MG tablet Take 1 tablet by mouth 2 (two) times daily. 06/05/21   [provider]  Multiple Vitamin (ONE-DAILY MULTI-VITAMIN) TABS Take 1 tablet by mouth daily. 10/05/21   [provider]  omeprazole (PRILOSEC) 10 MG capsule Take 10 mg by mouth daily. 11/06/22   [provider]  senna (SENOKOT) 8.6 MG TABS tablet Take 2 tablets by mouth daily. 11/06/22   [provider]  spironolactone (ALDACTONE) 25 MG tablet Take 0.5 tablets (12.5 mg total) by mouth daily. 06/21/21   Alford Highland, MD  tamsulosin (FLOMAX) 0.4 MG CAPS capsule Take 1 capsule (0.4 mg total) by mouth daily. 02/04/21   Sondra Come, MD    Physical Exam: Vitals:   11/26/22 2039  BP: (!) 145/80  Pulse: 82  Resp: (!) 22  Temp: 98.1 F (36.7 C)  TempSrc: Oral  SpO2: 97%     Constitutional: NAD, no pallor or diaphoresis  Eyes: PERTLA, lids and conjunctivae normal ENMT: Mucous membranes are moist. Posterior pharynx clear of any exudate or lesions.   Neck: supple, no masses  Respiratory: no wheezing, no crackles. No accessory muscle use.  Cardiovascular: S1 & S2 heard, regular rate and rhythm. No  JVD. Abdomen: No distension, no tenderness, soft. Bowel sounds active.  Musculoskeletal: no clubbing / cyanosis. No joint deformity upper and lower extremities.   Skin: no significant rashes, lesions, ulcers. Warm, dry, well-perfused. Neurologic: No gross facial asymmetry. PERRLA. Mild dysarthria. Sensation to light touch intact. Strength 4-5/5 throughout all 4 limbs. Alert and oriented to person and place only.  Psychiatric: Calm. Cooperative.    Labs and Imaging on Admission: I have personally reviewed following labs and imaging studies  CBC: Recent Labs  Lab 11/26/22 0932  WBC 4.9  NEUTROABS 4.1  HGB 11.8*  HCT 36.4*  MCV 100.8*  PLT 184   Basic Metabolic Panel: Recent Labs  Lab 11/26/22 0932  NA 134*  K 5.1  CL 102  CO2 22  GLUCOSE 141*  BUN 19  CREATININE 0.77  CALCIUM 9.4   GFR: Estimated Creatinine Clearance: 87.6 mL/min (by C-G formula based on  SCr of 0.77 mg/dL). Liver Function Tests: Recent Labs  Lab 11/26/22 0932  AST 38  ALT 12  ALKPHOS 63  BILITOT 1.6*  PROT 8.1  ALBUMIN 4.0   No results for input(s): "LIPASE", "AMYLASE" in the last 168 hours. No results for input(s): "AMMONIA" in the last 168 hours. Coagulation Profile: Recent Labs  Lab 11/26/22 0932  INR 1.0   Cardiac Enzymes: Recent Labs  Lab 11/26/22 0953  CKTOTAL 177   BNP (last 3 results) No results for input(s): "PROBNP" in the last 8760 hours. HbA1C: No results for input(s): "HGBA1C" in the last 72 hours. CBG: Recent Labs  Lab 11/26/22 1646  GLUCAP 207*   Lipid Profile: No results for input(s): "CHOL", "HDL", "LDLCALC", "TRIG", "CHOLHDL", "LDLDIRECT" in the last 72 hours. Thyroid Function Tests: Recent Labs    11/26/22 1000  TSH 1.728   Anemia Panel: No results for input(s): "VITAMINB12", "FOLATE", "FERRITIN", "TIBC", "IRON", "RETICCTPCT" in the last 72 hours. Urine analysis:    Component Value Date/Time   COLORURINE STRAW (A) 11/26/2022 1008   APPEARANCEUR  CLEAR (A) 11/26/2022 1008   APPEARANCEUR Clear 02/23/2022 1000   LABSPEC 1.010 11/26/2022 1008   PHURINE 5.0 11/26/2022 1008   GLUCOSEU 50 (A) 11/26/2022 1008   HGBUR NEGATIVE 11/26/2022 1008   BILIRUBINUR NEGATIVE 11/26/2022 1008   BILIRUBINUR Negative 02/23/2022 1000   KETONESUR NEGATIVE 11/26/2022 1008   PROTEINUR NEGATIVE 11/26/2022 1008   NITRITE NEGATIVE 11/26/2022 1008   LEUKOCYTESUR NEGATIVE 11/26/2022 1008   Sepsis Labs: @LABRCNTIP (procalcitonin:4,lacticidven:4) ) Recent Results (from the past 240 hour(s))  SARS Coronavirus 2 by RT PCR (hospital order, performed in [facility] hospital lab) *cepheid single result test* Anterior Nasal Swab     Status: None   Collection Time: 11/26/22  9:32 AM   Specimen: Anterior Nasal Swab  Result Value Ref Range Status   SARS Coronavirus 2 by RT PCR NEGATIVE NEGATIVE Final    Comment: (NOTE) SARS-CoV-2 target nucleic acids are NOT DETECTED.  The SARS-CoV-2 RNA is generally detectable in upper and lower respiratory specimens during the acute phase of infection. The lowest concentration of SARS-CoV-2 viral copies this assay can detect is 250 copies / mL. A negative result does not preclude SARS-CoV-2 infection and should not be used as the sole basis for treatment or other patient management decisions.  A negative result may occur with improper specimen collection / handling, submission of specimen other than nasopharyngeal swab, presence of viral mutation(s) within the areas targeted by this assay, and inadequate number of viral copies (<250 copies / mL). A negative result must be combined with clinical observations, patient history, and epidemiological information.  Fact Sheet for Patients:   RoadLapTop.co.za  Fact Sheet for Healthcare Providers: http://kim-miller.com/  This test is not yet approved or  cleared by the [place] FDA and has been authorized for detection and/or  diagnosis of SARS-CoV-2 by FDA under an Emergency Use Authorization (EUA).  This EUA will remain in effect (meaning this test can be used) for the duration of the COVID-19 declaration under Section 564(b)(1) of the Act, 21 U.S.C. section 360bbb-3(b)(1), unless the authorization is terminated or revoked sooner.  Performed at [facility], [address] [place], [place] [address]      Radiological Exams on Admission: CT ANGIO HEAD NECK W WO CM  Result Date: 11/26/2022 CLINICAL DATA:  Neuro deficit, stroke suspected EXAM: CT ANGIOGRAPHY HEAD AND NECK WITH AND WITHOUT CONTRAST TECHNIQUE: Multidetector CT imaging of the head and neck was  performed using the standard protocol during bolus administration of intravenous contrast. Multiplanar CT image reconstructions and MIPs were obtained to evaluate the vascular anatomy. Carotid stenosis measurements (when applicable) are obtained utilizing NASCET criteria, using the distal internal carotid diameter as the denominator. RADIATION DOSE REDUCTION: This exam was performed according to the departmental dose-optimization program which includes automated exposure control, adjustment of the mA and/or kV according to patient size and/or use of iterative reconstruction technique. CONTRAST:  75mL OMNIPAQUE IOHEXOL 350 MG/ML SOLN COMPARISON:  Same-day CT head FINDINGS: CTA NECK FINDINGS Aortic arch: There is mild calcified plaque in the imaged aortic arch. The origins of the major branch vessels are patent. The subclavian arteries are patent to the level imaged. Right carotid system: The right common carotid artery is patent. There is bulky plaque at the carotid bifurcation resulting in severe stenosis of the origin of the external carotid artery but no hemodynamically significant stenosis or occlusion of the internal carotid artery. The distal internal carotid artery is patent. There is no evidence of dissection or aneurysm. Left carotid system: The left  common, internal, and external carotid arteries are patent, with scattered mild plaque but no hemodynamically significant stenosis or occlusion. There is no evidence of dissection or aneurysm. Vertebral arteries: The vertebral arteries are patent, without hemodynamically significant stenosis or occlusion there is no evidence of dissection or aneurysm. Skeleton: There is sclerosis in the C3 vertebral body, along the superior T5 endplate, and in the right third rib likely reflecting metastatic disease given the history of metastatic prostate cancer. There is no acute osseous abnormality. There is no visible canal hematoma. Other neck: The soft tissues of the neck are unremarkable. Upper chest: The imaged lung apices are clear. Review of the MIP images confirms the above findings CTA HEAD FINDINGS Anterior circulation: There is calcified plaque in the intracranial ICAs resulting in severe stenosis of the cavernous segment on the left and no greater than mild stenosis on the right. The bilateral MCAs and ACAS are patent, without proximal stenosis or occlusion. The anterior communicating artery is normal There is no aneurysm or AVM. Posterior circulation: The bilateral V4 segments are patent. The basilar artery is patent. The right PICA origin is not definitely seen. The other major cerebellar arteries appear patent. The bilateral PCAs are patent, without proximal stenosis or occlusion. Diminutive posterior communicating arteries are identified. There is no aneurysm or AVM. Venous sinuses: Patent. Anatomic variants: None. Review of the MIP images confirms the above findings IMPRESSION: 1. Calcified plaque in the intracranial ICAs resulting in severe stenosis of the cavernous segment on the left and no greater than mild stenosis on the right. Otherwise, patent intracranial vasculature without other significant stenosis or occlusion. 2. Calcified plaque at the carotid bifurcations, right worse than left, without  significant stenosis or occlusion of the internal carotid arteries 3. Sclerotic lesions in the C3 vertebral body, T5 vertebral body, and right third rib likely reflect metastatic disease given the history of metastatic prostate cancer. Electronically Signed   By: Lesia Hausen M.D.   On: 11/26/2022 14:57   CT Head Wo Contrast  Result Date: 11/26/2022 CLINICAL DATA:  Head neck trauma. EXAM: CT HEAD WITHOUT CONTRAST CT CERVICAL SPINE WITHOUT CONTRAST TECHNIQUE: Multidetector CT imaging of the head and cervical spine was performed following the standard protocol without intravenous contrast. Multiplanar CT image reconstructions of the cervical spine were also generated. RADIATION DOSE REDUCTION: This exam was performed according to the departmental dose-optimization program which includes automated exposure  control, adjustment of the mA and/or kV according to patient size and/or use of iterative reconstruction technique. COMPARISON:  None Available. FINDINGS: CT HEAD FINDINGS Brain: No hemorrhage, hydrocephalus, or masslike finding. Extensive chronic small vessel ischemic gliosis in the cerebral white matter. There is an infarct in the lateral left frontal cortex which appears recent and is new from 07/01/2022. Vascular: No hyperdense vessel or unexpected calcification. Skull: Normal. Negative for fracture or focal lesion. Sinuses/Orbits: No acute finding. CT CERVICAL SPINE FINDINGS Alignment: Normal. Skull base and vertebrae: No acute fracture. Sclerotic lesion in the left half of the C3 body, known metastatic prostate cancer. Soft tissues and spinal canal: No acute finding Disc levels:  Mild for age degenerative endplate spurring. Upper chest: Negative IMPRESSION: 1. 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  Known osseous metastatic disease. Electronically Signed   By: Tiburcio Pea M.D.   On: 11/26/2022 10:12   CT Cervical Spine Wo Contrast  Result Date: 11/26/2022 CLINICAL DATA:   Head neck trauma. EXAM: CT HEAD WITHOUT CONTRAST CT CERVICAL SPINE WITHOUT CONTRAST TECHNIQUE: Multidetector CT imaging of the head and cervical spine was performed following the standard protocol without intravenous contrast. Multiplanar CT image reconstructions of the cervical spine were also generated. RADIATION DOSE REDUCTION: This exam was performed according to the departmental dose-optimization program which includes automated exposure control, adjustment of the mA and/or kV according to patient size and/or use of iterative reconstruction technique. COMPARISON:  None Available. FINDINGS: CT HEAD FINDINGS Brain: No hemorrhage, hydrocephalus, or masslike finding. Extensive chronic small vessel ischemic gliosis in the cerebral white matter. There is an infarct in the lateral left frontal cortex which appears recent and is new from 07/01/2022. Vascular: No hyperdense vessel or unexpected calcification. Skull: Normal. Negative for fracture or focal lesion. Sinuses/Orbits: No acute finding. CT CERVICAL SPINE FINDINGS Alignment: Normal. Skull base and vertebrae: No acute fracture. Sclerotic lesion in the left half of the C3 body, known metastatic prostate cancer. Soft tissues and spinal canal: No acute finding Disc levels:  Mild for age degenerative endplate spurring. Upper chest: Negative IMPRESSION: 1. 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  Known osseous metastatic disease. Electronically Signed   By: Tiburcio Pea M.D.   On: 11/26/2022 10:12   DG Chest Port 1 View  Result Date: 11/26/2022 CLINICAL DATA:  77 year old male with history of trauma from a fall. Sepsis. EXAM: PORTABLE CHEST 1 VIEW COMPARISON:  Chest x-ray 06/18/2021. FINDINGS: Low lung volumes. Linear areas of architectural distortion in the left lower lung, similar to prior study from 06/18/2021, presumably chronic scarring. No acute consolidative airspace disease. No pleural effusions. No pneumothorax. No evidence  of pulmonary edema. Heart size is borderline enlarged. The patient is rotated to the left on today's exam, resulting in distortion of the mediastinal contours and reduced diagnostic sensitivity and specificity for mediastinal pathology. Atherosclerotic calcifications are noted in the thoracic aorta. Left-sided pacemaker/AICD with lead tip projecting over the expected location of the right ventricular apex. IMPRESSION: 1. Low lung volumes without radiographic evidence of acute cardiopulmonary disease. 2. Aortic atherosclerosis. Electronically Signed   By: Trudie Reed M.D.   On: 11/26/2022 09:46    EKG: Independently reviewed. Sinus rhythm, PVC, 1st degree AV block.   Assessment/Plan   1. Acute encephalopathy; ischemic CVA  - Found down, does not remember the fall, and reported to have facial droop and dysarthria initially  - Left frontal lobe infarct noted on CT, new since MRI  in April 2024  - Appreciate neurology consultation  - Passed swallow screen in ED  - Continue cardiac monitoring and neuro checks, check MRI brain with & without, EEG, A1c, lipids, and TTE, consult PT/OT/SLP, continue ASA 81 mg daily   2. Prostate cancer  - Castration resistant, metastatic to bone, s/p palliative RT  - Currently on olaparib under the care of Dr. Cathie Hoops   3. Type II DM  - A1c was 7.0% in April 2023 - Check CBGs and use low-intensity SSI for now    4. Chronic HFmrEF  - EF 45-50% on TTE from October 2022  - Appears compensated  - Continue Coreg and Aldactone, monitor volume status   DVT prophylaxis: Lovenox  Code Status: Full  Level of Care: Level of care: Telemetry Medical Family Communication: None present   Disposition Plan:  Patient is from: home  Anticipated d/c is to: TBD Anticipated d/c date is: 9/22 or 11/28/22  Patient currently: Pending MRI brain, TTE, EEG, therapy assessments  Consults called: Neurology  Admission status: Observation     Briscoe Deutscher, MD [facility]  11/26/2022, 9:04 PM

## 2022-11-26 NOTE — ED Triage Notes (Signed)
Pt to ED via [facility] from home, lives alone. EMS called by alarm company for fall. Fall occurred yesterday afternoon or evening, pt found on floor, remembers fall, but unknown if LOC. No thinners. Strong urine smell.   CO2 20-25 RR 30 Cbg 134

## 2022-11-26 NOTE — ED Notes (Signed)
Mumma, MD, made aware of lactic 3.1

## 2022-11-26 NOTE — Discharge Summary (Signed)
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  Patient was found on the floor this morning with suspected right-sided facial droop and slurred speech and sent to ED for evaluation.  CT head showed new left frontal cortex infarct.  Case was discussed with on-call neurology, who recommended patient transfer to [facility] for further stroke workup including MRI as patient has a AICD and [facility] MRI cannot accommodate AICD.  Family informed and agreed with plan.         Procedures performed: None  Condition at discharge: fair  Exam Physical Exam Subjective:  Eyes: PERRL, lids and conjunctivae normal ENMT: Mucous membranes are moist. Posterior pharynx clear of any exudate or lesions.Normal dentition.  Neck: normal, supple, no masses, no thyromegaly Respiratory: clear to auscultation bilaterally, no wheezing, no crackles. Normal respiratory effort. No accessory muscle use.  Cardiovascular: Regular rate and rhythm, no murmurs / rubs / gallops. No extremity edema. 2+ pedal pulses. No carotid bruits.  Abdomen: no tenderness, no masses palpated. No hepatosplenomegaly. Bowel sounds positive.  Musculoskeletal: no clubbing / cyanosis. No joint deformity upper and lower extremities. Good ROM, no contractures. Normal muscle tone.  Skin: no rashes, lesions, ulcers. No induration Neurologic: CN 2-12  grossly intact. Sensation intact, DTR normal.  Slight weakness on right leg compared to left Psychiatric: Normal judgment and insight. Alert and oriented x 3. Normal mood.    Disposition: Transfer to [facility]  Discharge time: less than 30 minutes.   CT ANGIO HEAD NECK W WO CM  Result Date: 11/26/2022 CLINICAL DATA:  Neuro deficit, stroke suspected EXAM: CT ANGIOGRAPHY HEAD AND NECK WITH AND WITHOUT CONTRAST TECHNIQUE: Multidetector CT imaging of the head and neck was performed using the standard protocol during bolus administration of intravenous contrast. Multiplanar CT image reconstructions and MIPs were obtained to evaluate the vascular anatomy. Carotid stenosis measurements (when applicable) are obtained utilizing NASCET criteria, using the distal internal carotid diameter as the denominator. RADIATION DOSE REDUCTION: This exam was performed according to the departmental dose-optimization program which includes automated exposure control, adjustment of the mA and/or kV according to patient size and/or use of iterative reconstruction technique. CONTRAST:  75mL OMNIPAQUE IOHEXOL 350 MG/ML SOLN COMPARISON:  Same-day CT head FINDINGS: CTA NECK FINDINGS Aortic arch: There is mild calcified plaque in the imaged aortic arch. The origins of the major branch vessels are patent. The subclavian arteries are patent to the level imaged. Right carotid system: The right common carotid artery is patent. There is bulky plaque at the carotid bifurcation resulting in severe stenosis of the origin of the external carotid artery but no hemodynamically significant stenosis or occlusion of the internal carotid artery. The distal internal carotid artery is patent. There is no evidence of dissection or aneurysm. Left carotid system: The left common, internal, and external carotid arteries are patent, with scattered mild plaque but no hemodynamically significant stenosis or occlusion. There is no  evidence of  dissection or aneurysm. Vertebral arteries: The vertebral arteries are patent, without hemodynamically significant stenosis or occlusion there is no evidence of dissection or aneurysm. Skeleton: There is sclerosis in the C3 vertebral body, along the superior T5 endplate, and in the right third rib likely reflecting metastatic disease given the history of metastatic prostate cancer. There is no acute osseous abnormality. There is no visible canal hematoma. Other neck: The soft tissues of the neck are unremarkable. Upper chest: The imaged lung apices are clear. Review of the MIP images confirms the above findings CTA HEAD FINDINGS Anterior circulation: There is calcified plaque in the intracranial ICAs resulting in severe stenosis of the cavernous segment on the left and no greater than mild stenosis on the right. The bilateral MCAs and ACAS are patent, without proximal stenosis or occlusion. The anterior communicating artery is normal There is no aneurysm or AVM. Posterior circulation: The bilateral V4 segments are patent. The basilar artery is patent. The right PICA origin is not definitely seen. The other major cerebellar arteries appear patent. The bilateral PCAs are patent, without proximal stenosis or occlusion. Diminutive posterior communicating arteries are identified. There is no aneurysm or AVM. Venous sinuses: Patent. Anatomic variants: None. Review of the MIP images confirms the above findings IMPRESSION: 1. Calcified plaque in the intracranial ICAs resulting in severe stenosis of the cavernous segment on the left and no greater than mild stenosis on the right. Otherwise, patent intracranial vasculature without other significant stenosis or occlusion. 2. Calcified plaque at the carotid bifurcations, right worse than left, without significant stenosis or occlusion of the internal carotid arteries 3. Sclerotic lesions in the C3 vertebral body, T5 vertebral body, and right third rib likely reflect metastatic  disease given the history of metastatic prostate cancer. Electronically Signed   By: Lesia Hausen M.D.   On: 11/26/2022 14:57   CT Head Wo Contrast  Result Date: 11/26/2022 CLINICAL DATA:  Head neck trauma. EXAM: CT HEAD WITHOUT CONTRAST CT CERVICAL SPINE WITHOUT CONTRAST TECHNIQUE: Multidetector CT imaging of the head and cervical spine was performed following the standard protocol without intravenous contrast. Multiplanar CT image reconstructions of the cervical spine were also generated. RADIATION DOSE REDUCTION: This exam was performed according to the departmental dose-optimization program which includes automated exposure control, adjustment of the mA and/or kV according to patient size and/or use of iterative reconstruction technique. COMPARISON:  None Available. FINDINGS: CT HEAD FINDINGS Brain: No hemorrhage, hydrocephalus, or masslike finding. Extensive chronic small vessel ischemic gliosis in the cerebral white matter. There is an infarct in the lateral left frontal cortex which appears recent and is new from 07/01/2022. Vascular: No hyperdense vessel or unexpected calcification. Skull: Normal. Negative for fracture or focal lesion. Sinuses/Orbits: No acute finding. CT CERVICAL SPINE FINDINGS Alignment: Normal. Skull base and vertebrae: No acute fracture. Sclerotic lesion in the left half of the C3 body, known metastatic prostate cancer. Soft tissues and spinal canal: No acute finding Disc levels:  Mild for age degenerative endplate spurring. Upper chest: Negative IMPRESSION: 1. 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  Known osseous metastatic disease. Electronically Signed   By: Tiburcio Pea M.D.   On: 11/26/2022 10:12   CT Cervical Spine Wo Contrast  Result Date: 11/26/2022 CLINICAL DATA:  Head neck trauma. EXAM: CT HEAD WITHOUT CONTRAST CT CERVICAL SPINE WITHOUT CONTRAST TECHNIQUE: Multidetector CT imaging of the head and cervical spine was performed following the  standard protocol without intravenous contrast. Multiplanar CT image  reconstructions of the cervical spine were also generated. RADIATION DOSE REDUCTION: This exam was performed according to the departmental dose-optimization program which includes automated exposure control, adjustment of the mA and/or kV according to patient size and/or use of iterative reconstruction technique. COMPARISON:  None Available. FINDINGS: CT HEAD FINDINGS Brain: No hemorrhage, hydrocephalus, or masslike finding. Extensive chronic small vessel ischemic gliosis in the cerebral white matter. There is an infarct in the lateral left frontal cortex which appears recent and is new from 07/01/2022. Vascular: No hyperdense vessel or unexpected calcification. Skull: Normal. Negative for fracture or focal lesion. Sinuses/Orbits: No acute finding. CT CERVICAL SPINE FINDINGS Alignment: Normal. Skull base and vertebrae: No acute fracture. Sclerotic lesion in the left half of the C3 body, known metastatic prostate cancer. Soft tissues and spinal canal: No acute finding Disc levels:  Mild for age degenerative endplate spurring. Upper chest: Negative IMPRESSION: 1. 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  Known osseous metastatic disease. Electronically Signed   By: Tiburcio Pea M.D.   On: 11/26/2022 10:12   DG Chest Port 1 View  Result Date: 11/26/2022 CLINICAL DATA:  77 year old male with history of trauma from a fall. Sepsis. EXAM: PORTABLE CHEST 1 VIEW COMPARISON:  Chest x-ray 06/18/2021. FINDINGS: Low lung volumes. Linear areas of architectural distortion in the left lower lung, similar to prior study from 06/18/2021, presumably chronic scarring. No acute consolidative airspace disease. No pleural effusions. No pneumothorax. No evidence of pulmonary edema. Heart size is borderline enlarged. The patient is rotated to the left on today's exam, resulting in distortion of the mediastinal contours and reduced  diagnostic sensitivity and specificity for mediastinal pathology. Atherosclerotic calcifications are noted in the thoracic aorta. Left-sided pacemaker/AICD with lead tip projecting over the expected location of the right ventricular apex. IMPRESSION: 1. Low lung volumes without radiographic evidence of acute cardiopulmonary disease. 2. Aortic atherosclerosis. Electronically Signed   By: Trudie Reed M.D.   On: 11/26/2022 09:46   DG BONE DENSITY (DXA)  Result Date: 10/28/2022 EXAM: DUAL X-RAY ABSORPTIOMETRY (DXA) FOR BONE MINERAL DENSITY IMPRESSION: Your patient Matthew Mcgrath completed a BMD test on 10/28/2022 using the [employer] iDXA DXA System (software version: 14.10) manufactured by [employer]. The following summarizes the results of our evaluation. Technologist: PATIENT BIOGRAPHICAL: Name: Matthew Mcgrath, Matthew Mcgrath Patient ID: [id number] Birth Date: [birth year] Height: 69.0 in. Gender: Male Exam Date: 10/28/2022 Weight: 189.1 lbs. Indications: Prostate Cancer Fractures:             Treatments: DENSITOMETRY RESULTS: Site         Region     Measured Date Measured Age WHO Classification Young Adult T-score BMD         %Change vs. Previous Significant Change (*) DualFemur Neck Right 10/28/2022 [age] Osteoporosis -3.2 0.659 g/cm2 - - DualFemur Total Mean 10/28/2022 75.0 Osteopenia -2.3 0.767 g/cm2 - - Left Forearm Radius 33% 10/28/2022 75.0 Osteopenia -1.2 0.869 g/cm2 - - ASSESSMENT: The BMD measured at Femur Neck Right is 0.659 g/cm2 with a T-score of -3.2 is markedly low. This patient is considered osteoporotic by [facility]) Criteria. The scan quality is good. Lumbar spine was not utilized due to advanced degenerative changes. World Health Organization [facility]) criteria for post-menopausal, Caucasian Women: Normal:                   T-score at or above -1 SD Osteopenia/low bone mass: T-score between -1 and -2.5 SD Osteoporosis:  T-score at or below -2.5 SD RECOMMENDATIONS: 1. All  patients should optimize calcium and vitamin D intake. 2. Consider FDA-approved medical therapies in postmenopausal women and men aged 41 years and older, based on the following: a. A hip or vertebral(clinical or morphometric) fracture b. T-score < -2.5 at the femoral neck or spine after appropriate evaluation to exclude secondary causes c. Low bone mass (T-score between -1.0 and -2.5 at the femoral neck or spine) and a 10-year probability of a hip fracture > 3% or a 10-year probability of a major osteoporosis-related fracture > 20% based on the [place]-adapted WHO algorithm 3. Clinician judgment and/or patient preferences may indicate treatment for people with 10-year fracture probabilities above or below these levels FOLLOW-UP: People with diagnosed cases of osteoporosis or osteopenia should be regularly tested for bone mineral density. For patients eligible for Medicare, routine testing is allowed once every 2 years. The testing frequency can be increased to one year for patients who have rapidly progressing disease, or for those who are receiving medical therapy to restore bone mass. I have reviewed this report, and agree with the above findings. [facility] Electronically Signed   By: Baird Lyons M.D.   On: 10/28/2022 11:54   Results for orders placed or performed during the hospital encounter of 11/26/22  SARS Coronavirus 2 by RT PCR (hospital order, performed in [facility] hospital lab) *cepheid single result test* Anterior Nasal Swab     Status: None   Collection Time: 11/26/22  9:32 AM   Specimen: Anterior Nasal Swab  Result Value Ref Range Status   SARS Coronavirus 2 by RT PCR NEGATIVE NEGATIVE Final    Comment: (NOTE) SARS-CoV-2 target nucleic acids are NOT DETECTED.  The SARS-CoV-2 RNA is generally detectable in upper and lower respiratory specimens during the acute phase of infection. The lowest concentration of SARS-CoV-2 viral copies this assay can detect is 250 copies / mL. A  negative result does not preclude SARS-CoV-2 infection and should not be used as the sole basis for treatment or other patient management decisions.  A negative result may occur with improper specimen collection / handling, submission of specimen other than nasopharyngeal swab, presence of viral mutation(s) within the areas targeted by this assay, and inadequate number of viral copies (<250 copies / mL). A negative result must be combined with clinical observations, patient history, and epidemiological information.  Fact Sheet for Patients:   RoadLapTop.co.za  Fact Sheet for Healthcare Providers: http://kim-miller.com/  This test is not yet approved or  cleared by the [place] FDA and has been authorized for detection and/or diagnosis of SARS-CoV-2 by FDA under an Emergency Use Authorization (EUA).  This EUA will remain in effect (meaning this test can be used) for the duration of the COVID-19 declaration under Section 564(b)(1) of the Act, 21 U.S.C. section 360bbb-3(b)(1), unless the authorization is terminated or revoked sooner.  Performed at [facility], [address] [place] [place], [place] [address]     Signed:  Emeline General MD.  [facility] 11/26/2022, 3:11 PM

## 2022-11-26 NOTE — ED Provider Notes (Signed)
[facility] Provider Note    Event Date/Time   First MD Initiated Contact with Patient 11/26/22 0915     (approximate)   History   Fall and Code Sepsis   HPI  Matthew Mcgrath is a 77 y.o. male past medical history significant for metastatic prostate cancer, anemia, who presents to the emergency department with altered mental status.  History is provided by EMS and then family members who arrived later.  Stated that the patient lives alone by himself.  Last night left the stove on all night long.  Had a fall which she does not recall the details of.  Patient was on the ground all night long found covered in urine.  Patient does not recall the events of what happened.  Denies any pain.  To states that he feels weak.  Not on anticoagulation.       Physical Exam   Triage Vital Signs: ED Triage Vitals  Encounter Vitals Group     BP 11/26/22 0923 (!) 154/94     Systolic BP Percentile --      Diastolic BP Percentile --      Pulse Rate 11/26/22 0923 100     Resp 11/26/22 0923 (!) 22     Temp 11/26/22 0924 97.7 F (36.5 C)     Temp Source 11/26/22 0924 Oral     SpO2 11/26/22 0922 98 %     Weight --      Height --      Head Circumference --      Peak Flow --      Pain Score 11/26/22 0921 0     Pain Loc --      Pain Education --      Exclude from Growth Chart --     Most recent vital signs: Vitals:   11/26/22 1230 11/26/22 1300  BP: (!) 143/90 (!) 143/86  Pulse: 84 81  Resp: (!) 22 16  Temp:    SpO2: 96% 96%    Physical Exam Constitutional:      Appearance: He is well-developed.  HENT:     Head: Atraumatic.  Eyes:     Conjunctiva/sclera: Conjunctivae normal.  Cardiovascular:     Rate and Rhythm: Regular rhythm.  Pulmonary:     Effort: No respiratory distress.  Abdominal:     Tenderness: There is no abdominal tenderness.  Musculoskeletal:     Cervical back: Normal range of motion.  Skin:    General: Skin is warm.  Neurological:      Mental Status: He is alert. He is disoriented.     Comments: 5/5 strength bilateral upper and lower extremities.  Normal finger-to-nose.     IMPRESSION / MDM / ASSESSMENT AND PLAN / ED COURSE  I reviewed the triage vital signs and the nursing notes.  Differential diagnosis including rhabdomyolysis, infectious process, dehydration, intracranial hemorrhage, CVA, ACS, dysrhythmia  EKG  I, Corena Herter, the attending physician, personally viewed and interpreted this ECG.   Rate: Normal  Rhythm: Normal sinus  Axis: Normal  Intervals: Normal  ST&T Change: None  No tachycardic or bradycardic dysrhythmias while on cardiac telemetry.  RADIOLOGY I independently reviewed imaging, my interpretation of imaging: CT head without signs of intracranial hemorrhage.  Noted an area of questionable new infarct when compared to prior MRI.    LABS (all labs ordered are listed, but only abnormal results are displayed) Labs interpreted as -    Labs Reviewed  LACTIC ACID, PLASMA -  Abnormal; Notable for the following components:      Result Value   Lactic Acid, Venous 3.1 (*)    All other components within normal limits  COMPREHENSIVE METABOLIC PANEL - Abnormal; Notable for the following components:   Sodium 134 (*)    Glucose, Bld 141 (*)    Total Bilirubin 1.6 (*)    All other components within normal limits  CBC WITH DIFFERENTIAL/PLATELET - Abnormal; Notable for the following components:   RBC 3.61 (*)    Hemoglobin 11.8 (*)    HCT 36.4 (*)    MCV 100.8 (*)    nRBC 0.4 (*)    Lymphs Abs 0.4 (*)    All other components within normal limits  URINALYSIS, W/ REFLEX TO CULTURE (INFECTION SUSPECTED) - Abnormal; Notable for the following components:   Color, Urine STRAW (*)    APPearance CLEAR (*)    Glucose, UA 50 (*)    All other components within normal limits  SARS CORONAVIRUS 2 BY RT PCR  CULTURE, BLOOD (ROUTINE X 2)  CULTURE, BLOOD (ROUTINE X 2)  PROTIME-INR  APTT  BLOOD GAS,  VENOUS  COOXEMETRY PANEL  CK  LACTIC ACID, PLASMA  HEMOGLOBIN A1C  VITAMIN B12  TSH     MDM  CT scan with findings consistent with his known metastatic disease.  CT scan of the head with questionable new infarct.  Lab work reassuring other than an elevated lactic acid.  No other signs of an obvious infectious process.  No signs of rhabdomyolysis.  Given IV fluids.  UA without signs of urinary tract infection.  Chest x-ray with no signs of pneumonia.  Consulted hospitalist for admission for further stroke workup and altered mental status.     PROCEDURES:  Critical Care performed: No  Procedures  Patient's presentation is most consistent with acute presentation with potential threat to life or bodily function.   MEDICATIONS ORDERED IN ED: Medications  acetaminophen (TYLENOL) tablet 1,000 mg (has no administration in time range)  aspirin EC tablet 81 mg (has no administration in time range)  olaparib (LYNPARZA) tablet 150 mg (has no administration in time range)  tamsulosin (FLOMAX) capsule 0.4 mg (has no administration in time range)   stroke: early stages of recovery book (has no administration in time range)  0.9 %  sodium chloride infusion ( Intravenous New Bag/Given 11/26/22 1255)  acetaminophen (TYLENOL) tablet 650 mg (has no administration in time range)    Or  acetaminophen (TYLENOL) 160 MG/5ML solution 650 mg (has no administration in time range)    Or  acetaminophen (TYLENOL) suppository 650 mg (has no administration in time range)  senna-docusate (Senokot-S) tablet 1 tablet (has no administration in time range)  enoxaparin (LOVENOX) injection 40 mg (has no administration in time range)  hydrALAZINE (APRESOLINE) injection 5 mg (has no administration in time range)  insulin aspart (novoLOG) injection 0-9 Units (has no administration in time range)  LORazepam (ATIVAN) injection 0.5 mg (has no administration in time range)  sodium chloride 0.9 % bolus 500 mL (0 mLs  Intravenous Stopped 11/26/22 1039)  aspirin chewable tablet 324 mg (324 mg Oral Given 11/26/22 1034)  sodium chloride 0.9 % bolus 1,000 mL (0 mLs Intravenous Stopped 11/26/22 1249)  iohexol (OMNIPAQUE) 350 MG/ML injection 75 mL (75 mLs Intravenous Contrast Given 11/26/22 1417)    FINAL CLINICAL IMPRESSION(S) / ED DIAGNOSES   Final diagnoses:  Fall, initial encounter  Altered mental status, unspecified altered mental status type     Rx /  DC Orders   ED Discharge Orders     None        Note:  This document was prepared using Dragon voice recognition software and may include unintentional dictation errors.   Corena Herter, MD 11/26/22 1610

## 2022-11-26 NOTE — ED Notes (Signed)
Assisted with call light, pt needed to be changed, sheets, pads and brief was soaked. Assisted with changing the pt and changing sheets. [facility] was in the room after assist to transport pt. Sister is at pts bedside.

## 2022-11-26 NOTE — Consult Note (Addendum)
Neurology Consultation  Reason for Consult: fall, facial droop, possible syncope Referring Physician: Dr. Chipper Herb hospitalist  CC: Found down on the floor, left facial droop, possible syncope  History is obtained from: Patient, sister at bedside  HPI: Matthew Mcgrath is a 77 y.o. male past medical history of diabetes, hypertension, metastatic prostate cancer to the bone, presented to the emergency department after being found down after the alarm company called EMS for a fall.  Fall occurred yesterday afternoon or evening.  Patient was found on the floor.  He remembers the fall but does not remember if he lost consciousness or not.  He is not on any thinners.  On arrival, there was a very strong smell of urine.  Urinalysis was unremarkable.  Lactate was elevated.  Labs otherwise unremarkable Family also noted that he had some left-sided eyelid and facial drooping that has been going on for the past few days. Speech was also slurred.  Progressively becoming weaker with generalized weakness and progressive slurring of words. CT head was done.  Question of a left frontal hypodensity new from April-does not correlate with symptoms.  Is a new finding.  Recommended MRI but has a pacemaker and MRI cannot be done at this facility. Does not remember if there was any shaking or convulsions.  No witnesses to know what happened at that time.   LKW: Sometime yesterday-greater than 24 hours ago-unclear IV thrombolysis given?: no, unclear last known well EVT: No-unclear last known well Premorbid modified Rankin scale (mRS):4-uses a walker at all times and outside uses a wheelchair.   ROS: Full ROS was performed and is negative except as noted in the HPI.   Past Medical History:  Diagnosis Date   Cardiomyopathy (HCC)    Congestive heart failure (HCC)    Diabetes mellitus type 2, controlled (HCC)    Elevated lipids    Family history of breast cancer    Family history of colon cancer    HTN (hypertension)      Family History  Problem Relation Age of Onset   Heart failure Mother    Heart disease Mother    Alcoholism Father    Hypertension Sister    Diabetes Sister    Hypertension Brother    Diabetes Brother    Colon cancer Brother        vs prostate   Multiple myeloma Brother    Breast cancer Cousin     Social History:   reports that he quit smoking about 2 years ago. His smoking use included cigarettes. He started smoking about 32 years ago. He has a 7.5 pack-year smoking history. He has been exposed to tobacco smoke. He has never used smokeless tobacco. He reports that he does not currently use drugs after having used the following drugs: Marijuana. Frequency: 2.00 times per week. He reports that he does not drink alcohol.  Medications  Current Facility-Administered Medications:    [START ON 11/27/2022]  stroke: early stages of recovery book, , Does not apply, Once, Mikey College T, MD   0.9 %  sodium chloride infusion, , Intravenous, Continuous, Mikey College T, MD, Last Rate: 100 mL/hr at 11/26/22 1255, New Bag at 11/26/22 1255   acetaminophen (TYLENOL) tablet 650 mg, 650 mg, Oral, Q4H PRN **OR** acetaminophen (TYLENOL) 160 MG/5ML solution 650 mg, 650 mg, Per Tube, Q4H PRN **OR** acetaminophen (TYLENOL) suppository 650 mg, 650 mg, Rectal, Q4H PRN, Mikey College T, MD   acetaminophen (TYLENOL) tablet 1,000 mg, 1,000 mg, Oral, BID, Chipper Herb,  Renae Fickle, MD   [START ON 11/27/2022] aspirin EC tablet 81 mg, 81 mg, Oral, Daily, Mikey College T, MD   clopidogrel (PLAVIX) tablet 75 mg, 75 mg, Oral, Daily, Mikey College T, MD, 75 mg at 11/26/22 1256   darolutamide (NUBEQA) tablet 600 mg, 600 mg, Oral, BID WC, Zhang, Renae Fickle, MD   enoxaparin (LOVENOX) injection 40 mg, 40 mg, Subcutaneous, Q24H, Zhang, Ping T, MD   hydrALAZINE (APRESOLINE) injection 5 mg, 5 mg, Intravenous, Q6H PRN, Mikey College T, MD   insulin aspart (novoLOG) injection 0-9 Units, 0-9 Units, Subcutaneous, TID WC, Zhang, Ping T, MD   iohexol  (OMNIPAQUE) 350 MG/ML injection 75 mL, 75 mL, Intravenous, Once PRN, Mikey College T, MD   LORazepam (ATIVAN) injection 0.5 mg, 0.5 mg, Intravenous, Q4H PRN, Mikey College T, MD   olaparib [facility]) tablet 150 mg, 150 mg, Oral, BID, Zhang, Ilda Foil T, MD   senna-docusate (Senokot-S) tablet 1 tablet, 1 tablet, Oral, QHS PRN, Emeline General, MD   [START ON 11/27/2022] tamsulosin (FLOMAX) capsule 0.4 mg, 0.4 mg, Oral, Daily, Mikey College T, MD  Current Outpatient Medications:    acetaminophen (TYLENOL) 650 MG CR tablet, SMARTSIG:2 Tablet(s) By Mouth Twice Daily, Disp: , Rfl:    ASPIRIN LOW DOSE 81 MG tablet, Take 81 mg by mouth daily., Disp: , Rfl:    carvedilol (COREG) 3.125 MG tablet, Take 3.125 mg by mouth 2 (two) times daily., Disp: , Rfl:    cholecalciferol (VITAMIN D3) 25 MCG (1000 UNIT) tablet, Take 1,000 Units by mouth daily., Disp: , Rfl:    darolutamide (NUBEQA) 300 MG tablet, Take 600 mg by mouth 2 (two) times daily with a meal., Disp: , Rfl:    glucose blood (TRUE METRIX BLOOD GLUCOSE TEST) test strip, 1 each by Other route as needed for other. Use as instructed, Disp: , Rfl:    GLUMETZA 1000 MG 24 hr tablet, Take 1,000 mg by mouth daily., Disp: , Rfl:    JARDIANCE 10 MG TABS tablet, Take 10 mg by mouth daily., Disp: , Rfl:    LEUPROLIDE ACETATE, 6 MONTH, 45 MG injection, Inject into the skin., Disp: , Rfl:    LIDODERM 5 %, Apply 1 patch to skin every morning as needed for L lower back pain. Remove after 12 hours. Call for refills, Disp: , Rfl:    lisinopril (ZESTRIL) 2.5 MG tablet, Take 2.5 mg by mouth daily., Disp: , Rfl:    LYNPARZA 100 MG tablet, Take by mouth. Take with 150mg  for a total of 250mg , Disp: , Rfl:    LYNPARZA 150 MG tablet, Take by mouth. Take along with 100mg  for total of 250mg , Disp: , Rfl:    magnesium oxide (MAG-OX) 400 (240 Mg) MG tablet, Take 1 tablet by mouth 2 (two) times daily., Disp: , Rfl:    metFORMIN (GLUCOPHAGE-XR) 500 MG 24 hr tablet, Take 500 mg by mouth every  evening., Disp: , Rfl:    Multiple Vitamin (ONE-DAILY MULTI-VITAMIN) TABS, Take 1 tablet by mouth daily., Disp: , Rfl:    Omega-3 Fatty Acids (OMEGA III EPA+DHA) 1000 MG CAPS, Take 1 capsule by mouth 2 (two) times daily., Disp: , Rfl:    spironolactone (ALDACTONE) 25 MG tablet, Take 0.5 tablets (12.5 mg total) by mouth daily., Disp: , Rfl:    tamsulosin (FLOMAX) 0.4 MG CAPS capsule, Take 1 capsule (0.4 mg total) by mouth daily., Disp: 30 capsule, Rfl: 2   TRUEplus Lancets 28G MISC, by Does not apply route., Disp: ,  Rfl:    Exam: Current vital signs: BP (!) 143/90   Pulse 84   Temp 97.7 F (36.5 C) (Oral)   Resp (!) 22   Ht 5\' 9"  (1.753 m)   Wt 87.9 kg   SpO2 96%   BMI 28.62 kg/m  Vital signs in last 24 hours: Temp:  [97.7 F (36.5 C)] 97.7 F (36.5 C) (09/21 0924) Pulse Rate:  [80-100] 84 (09/21 1230) Resp:  [17-22] 22 (09/21 1230) BP: (134-154)/(88-94) 143/90 (09/21 1230) SpO2:  [94 %-100 %] 96 % (09/21 1230) Weight:  [87.9 kg] 87.9 kg (09/21 1247) General: Awake alert in no distress HEENT: Normocephalic atraumatic Lungs: Clear Cardiovascular: Regular rhythm Neurological examination awake alert oriented x 2 Could not tell me the current month. Got his age correct.  Got place correct. Mildly dysarthric-different than baseline speech per family. No aphasia.  Poor attention concentration Cranial nerve examination: Pupils equal round react light, extract movements intact, visual fields full, I did not notice any significant ptosis with the family feels that the left eyelid is droopy of than usual, I did not notice any facial asymmetry but the family thinks that the left nasolabial fold is more flattened than usual. Motor examination with no drift in the upper extremities.  Both lower extremities are 4+/5 with no drift. Sensation intact to light touch. Coordination examination with no dysmetria NIH stroke scale-3    Labs I have reviewed labs in epic and the results  pertinent to this consultation are:  CBC    Component Value Date/Time   WBC 4.9 11/26/2022 0932   RBC 3.61 (L) 11/26/2022 0932   HGB 11.8 (L) 11/26/2022 0932   HGB 10.2 (L) 10/25/2022 1029   HGB 13.2 03/14/2014 1557   HCT 36.4 (L) 11/26/2022 0932   HCT 41.8 03/14/2014 1557   PLT 184 11/26/2022 0932   PLT 192 10/25/2022 1029   PLT 219 03/14/2014 1557   MCV 100.8 (H) 11/26/2022 0932   MCV 95 03/14/2014 1557   MCH 32.7 11/26/2022 0932   MCHC 32.4 11/26/2022 0932   RDW 15.4 11/26/2022 0932   RDW 14.8 (H) 03/14/2014 1557   LYMPHSABS 0.4 (L) 11/26/2022 0932   LYMPHSABS 1.8 03/14/2014 1557   MONOABS 0.3 11/26/2022 0932   MONOABS 0.6 03/14/2014 1557   EOSABS 0.0 11/26/2022 0932   EOSABS 0.1 03/14/2014 1557   BASOSABS 0.0 11/26/2022 0932   BASOSABS 0.1 03/14/2014 1557    CMP     Component Value Date/Time   NA 134 (L) 11/26/2022 0932   NA 141 03/14/2014 1557   K 5.1 11/26/2022 0932   K 3.9 03/14/2014 1557   CL 102 11/26/2022 0932   CL 108 (H) 03/14/2014 1557   CO2 22 11/26/2022 0932   CO2 27 03/14/2014 1557   GLUCOSE 141 (H) 11/26/2022 0932   GLUCOSE 232 (H) 03/14/2014 1557   BUN 19 11/26/2022 0932   BUN 10 03/14/2014 1557   CREATININE 0.77 11/26/2022 0932   CREATININE 0.82 10/25/2022 1028   CREATININE 0.76 03/14/2014 1557   CALCIUM 9.4 11/26/2022 0932   CALCIUM 8.4 (L) 03/14/2014 1557   PROT 8.1 11/26/2022 0932   PROT 6.8 03/14/2014 1557   ALBUMIN 4.0 11/26/2022 0932   ALBUMIN 3.0 (L) 03/14/2014 1557   AST 38 11/26/2022 0932   AST 15 10/25/2022 1028   ALT 12 11/26/2022 0932   ALT 11 10/25/2022 1028   ALT 79 (H) 03/14/2014 1557   ALKPHOS 63 11/26/2022 0932   ALKPHOS 115  03/14/2014 1557   BILITOT 1.6 (H) 11/26/2022 0932   BILITOT 0.5 10/25/2022 1028   GFRNONAA >60 11/26/2022 0932   GFRNONAA >60 10/25/2022 1028   GFRNONAA >60 03/14/2014 1557   GFRAA >60 06/23/2015 1019   GFRAA >60 03/14/2014 1557   Lab Results  Component Value Date   HGBA1C 7.0 (H)  06/19/2021      Imaging I have reviewed the images obtained:  CT-head 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  No nauseous metastatic disease.  Assessment: 77 year old with metastatic prostate cancer to the bone amongst other comorbidities presenting for evaluation after he was found down by EMS alerted by the alarm company with an unclear last known well. Family has been noticing increasing slurred speech over the past 1 week and also noticed some left-sided facial drooping. CT head with a recent appearing cortical infarct in the left frontal cortex-although does not corroborate with the left facial droop symptoms, unclear if there was embolic phenomenon that also led to fall and stroke at the same time. Given his history of prostate cancer and risk factors, I would want to get an MRI on him but unfortunately [facility] does not have the capability to do MRI and patient with pacemakers or AICD's. Also, the unwitnessed fall has no further details-an EEG would also be helpful to see if there was any electrographic abnormality that caused the fall or can point towards a seizure.  Again that facility is unavailable at [facility] over the weekend and only available at [facility].   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 Frequent neurochecks Telemetry Aspirin 81 Check lipid panel.  Goal LDL less than 70.  Use high intensity statin if not at goal Check A1c.  Goal less than 7 CT angiography head and neck-pending 2D echo MRI of the brain with and without contrast-given history of cancer.  Will likely happen on Monday.  Please ensure orders are  put in at [facility] so that it is known to the technologist that he needs to be scanned on Monday. PT, OT, speech therapy N.p.o. until cleared by bedside swallow evaluation or formal swallow evaluation Routine EEG No antiepileptics for now  Stroke team will follow once admitted at [facility]  Please make sure that stroke team is notified of patient arrival at [facility].  Plan was discussed with Dr. Chipper Herb.  -- Milon Dikes, MD Neurologist [facility] Pager: [phone]

## 2022-11-27 ENCOUNTER — Observation Stay (HOSPITAL_COMMUNITY): Payer: Medicare (Managed Care)

## 2022-11-27 ENCOUNTER — Observation Stay (HOSPITAL_BASED_OUTPATIENT_CLINIC_OR_DEPARTMENT_OTHER): Payer: Medicare (Managed Care)

## 2022-11-27 DIAGNOSIS — C61 Malignant neoplasm of prostate: Secondary | ICD-10-CM | POA: Diagnosis not present

## 2022-11-27 DIAGNOSIS — I6389 Other cerebral infarction: Secondary | ICD-10-CM | POA: Diagnosis not present

## 2022-11-27 DIAGNOSIS — E119 Type 2 diabetes mellitus without complications: Secondary | ICD-10-CM | POA: Diagnosis not present

## 2022-11-27 DIAGNOSIS — G9340 Encephalopathy, unspecified: Secondary | ICD-10-CM

## 2022-11-27 DIAGNOSIS — R569 Unspecified convulsions: Secondary | ICD-10-CM | POA: Diagnosis not present

## 2022-11-27 DIAGNOSIS — Z794 Long term (current) use of insulin: Secondary | ICD-10-CM

## 2022-11-27 LAB — BLOOD CULTURE ID PANEL (REFLEXED) - BCID2

## 2022-11-27 LAB — ECHOCARDIOGRAM COMPLETE
Height: 69 in
S' Lateral: 3.7 cm
Single Plane A4C EF: 36.4 %
Weight: 3100.55 oz

## 2022-11-27 LAB — BASIC METABOLIC PANEL
Anion gap: 9 (ref 5–15)
BUN: 11 mg/dL (ref 8–23)
CO2: 21 mmol/L — ABNORMAL LOW (ref 22–32)
Calcium: 8.6 mg/dL — ABNORMAL LOW (ref 8.9–10.3)
Chloride: 107 mmol/L (ref 98–111)
Creatinine, Ser: 0.7 mg/dL (ref 0.61–1.24)
GFR, Estimated: >60 mL/min (ref >60)
Glucose, Bld: 165 mg/dL — ABNORMAL HIGH (ref 70–99)
Potassium: 3.4 mmol/L — ABNORMAL LOW (ref 3.5–5.1)
Sodium: 137 mmol/L (ref 135–145)

## 2022-11-27 LAB — CBC
HCT: 28.7 % — ABNORMAL LOW (ref 39.0–52.0)
Hemoglobin: 9.2 g/dL — ABNORMAL LOW (ref 13.0–17.0)
MCH: 31.9 pg (ref 26.0–34.0)
MCHC: 32.1 g/dL (ref 30.0–36.0)
MCV: 99.7 fL (ref 80.0–100.0)
Platelets: 165 10*3/uL (ref 150–400)
RBC: 2.88 MIL/uL — ABNORMAL LOW (ref 4.22–5.81)
RDW: 15.5 % (ref 11.5–15.5)
WBC: 3.5 10*3/uL — ABNORMAL LOW (ref 4.0–10.5)
nRBC: 0 % (ref 0.0–0.2)

## 2022-11-27 LAB — GLUCOSE, CAPILLARY
Glucose-Capillary: 162 mg/dL — ABNORMAL HIGH (ref 70–99)
Glucose-Capillary: 214 mg/dL — ABNORMAL HIGH (ref 70–99)
Glucose-Capillary: 216 mg/dL — ABNORMAL HIGH (ref 70–99)

## 2022-11-27 LAB — LIPID PANEL
Cholesterol: 129 mg/dL (ref 0–200)
HDL: 37 mg/dL — ABNORMAL LOW (ref >40)
LDL Cholesterol: 66 mg/dL (ref 0–99)
Total CHOL/HDL Ratio: 3.5 RATIO
Triglycerides: 128 mg/dL (ref <150)
VLDL: 26 mg/dL (ref 0–40)

## 2022-11-27 LAB — HEMOGLOBIN A1C
Hgb A1c MFr Bld: 7.1 % — ABNORMAL HIGH (ref 4.8–5.6)
Hgb A1c MFr Bld: 7.1 % — ABNORMAL HIGH (ref 4.8–5.6)
Mean Plasma Glucose: 157.07 mg/dL
Mean Plasma Glucose: 157.07 mg/dL

## 2022-11-27 NOTE — Progress Notes (Signed)
EEG complete - results pending 

## 2022-11-27 NOTE — Progress Notes (Addendum)
STROKE TEAM PROGRESS NOTE   BRIEF HPI [name] is a 77 y.o. male with history of diabetes, hypertension, HFrEF (40 - 50%) metastatic prostate cancer to the bone, presented to the emergency department from [facility] after being found down after the alarm company called EMS for a fall. Fall occurred 9/20 in the afternoon or evening. Patient was found on the floor. He remembers the fall but does not remember if he lost consciousness or not.   SIGNIFICANT HOSPITAL EVENTS 9/21: Admission to the hospital.  CT: Cortical left frontal lobe infarct, new from April.  07/01/2022.  CTA head and neck: Calcified plaque in intracranial ICA's, severe stenosis on the left.  Right> left carotid calcifications at bifurcations.  A1c: 7.1, LDL: 66.  INTERIM HISTORY/SUBJECTIVE  On interview, patient is a poor historian.  Hesitancy, some dysarthria.  Starts and stops.  Says that he "hit his head" on the right side as he fell.  No history of CVA.  Says he will miss his medications 1 day out of the year.  Says that he has returned to baseline, no new complaints.  EEG negative.    Per family in room, patient is at his baseline cognition.  Unsure whether or not aspirin was part of his daily medications.     MRI scheduled for 9/23 and setting of ICD, will be given with and without contrast to rule out prostate cancer metastasis to brain.Neuro exam nonfocal.    Until then, we will continue with aspirin 81 mg daily with subcu Lovenox for VTE prophylaxis.  OBJECTIVE  CBC    Component Value Date/Time   WBC 3.5 (L) 11/27/2022 0733   RBC 2.88 (L) 11/27/2022 0733   HGB 9.2 (L) 11/27/2022 0733   HGB 10.2 (L) 10/25/2022 1029   HGB 13.2 03/14/2014 1557   HCT 28.7 (L) 11/27/2022 0733   HCT 41.8 03/14/2014 1557   PLT 165 11/27/2022 0733   PLT 192 10/25/2022 1029   PLT 219 03/14/2014 1557   MCV 99.7 11/27/2022 0733   MCV 95 03/14/2014 1557   MCH 31.9 11/27/2022 0733   MCHC 32.1 11/27/2022 0733   RDW 15.5  11/27/2022 0733   RDW 14.8 (H) 03/14/2014 1557   LYMPHSABS 0.4 (L) 11/26/2022 0932   LYMPHSABS 1.8 03/14/2014 1557   MONOABS 0.3 11/26/2022 0932   MONOABS 0.6 03/14/2014 1557   EOSABS 0.0 11/26/2022 0932   EOSABS 0.1 03/14/2014 1557   BASOSABS 0.0 11/26/2022 0932   BASOSABS 0.1 03/14/2014 1557    BMET    Component Value Date/Time   NA 134 (L) 11/26/2022 0932   NA 141 03/14/2014 1557   K 5.1 11/26/2022 0932   K 3.9 03/14/2014 1557   CL 102 11/26/2022 0932   CL 108 (H) 03/14/2014 1557   CO2 22 11/26/2022 0932   CO2 27 03/14/2014 1557   GLUCOSE 141 (H) 11/26/2022 0932   GLUCOSE 232 (H) 03/14/2014 1557   BUN 19 11/26/2022 0932   BUN 10 03/14/2014 1557   CREATININE 0.77 11/26/2022 0932   CREATININE 0.82 10/25/2022 1028   CREATININE 0.76 03/14/2014 1557   CALCIUM 9.4 11/26/2022 0932   CALCIUM 8.4 (L) 03/14/2014 1557   GFRNONAA >60 11/26/2022 0932   GFRNONAA >60 10/25/2022 1028   GFRNONAA >60 03/14/2014 1557    IMAGING past 24 hours EEG adult  Result Date: 11/27/2022 Matthew Quest, MD     11/27/2022  5:45 AM Patient Name: Matthew Mcgrath MRN: [id number] Epilepsy Attending: Lorrin Jackson  Annabelle Harman Referring Physician/Provider: Briscoe Deutscher, MD Date: 11/27/2022 Duration: 27.14 mins Patient history: 77yo M with ams getting eeg to evaluate for seizure Level of alertness: Awake, asleep AEDs during EEG study: None Technical aspects: This EEG study was done with scalp electrodes positioned according to the 10-20 International system of electrode placement. Electrical activity was reviewed with band pass filter of 1-70Hz , sensitivity of 7 uV/mm, display speed of 41mm/sec with a 60Hz  notched filter applied as appropriate. EEG data were recorded continuously and digitally stored.  Video monitoring was available and reviewed as appropriate. Description: The posterior dominant rhythm consists of 7.5 Hz activity of moderate voltage (25-35 uV) seen predominantly in posterior head regions, symmetric  and reactive to eye opening and eye closing. Sleep was characterized by vertex waves, sleep spindles (12 to 14 Hz), maximal frontocentral region. EEG showed continuous generalized 5 to 6 Hz theta slowing. Hyperventilation and photic stimulation were not performed.   ABNORMALITY - Continuous slow, generalized IMPRESSION: This study is suggestive of mild diffuse encephalopathy. No seizures or epileptiform discharges were seen throughout the recording. Matthew Mcgrath   CT ANGIO HEAD NECK W WO CM  Result Date: 11/26/2022 CLINICAL DATA:  Neuro deficit, stroke suspected EXAM: CT ANGIOGRAPHY HEAD AND NECK WITH AND WITHOUT CONTRAST TECHNIQUE: Multidetector CT imaging of the head and neck was performed using the standard protocol during bolus administration of intravenous contrast. Multiplanar CT image reconstructions and MIPs were obtained to evaluate the vascular anatomy. Carotid stenosis measurements (when applicable) are obtained utilizing NASCET criteria, using the distal internal carotid diameter as the denominator. RADIATION DOSE REDUCTION: This exam was performed according to the departmental dose-optimization program which includes automated exposure control, adjustment of the mA and/or kV according to patient size and/or use of iterative reconstruction technique. CONTRAST:  75mL OMNIPAQUE IOHEXOL 350 MG/ML SOLN COMPARISON:  Same-day CT head FINDINGS: CTA NECK FINDINGS Aortic arch: There is mild calcified plaque in the imaged aortic arch. The origins of the major branch vessels are patent. The subclavian arteries are patent to the level imaged. Right carotid system: The right common carotid artery is patent. There is bulky plaque at the carotid bifurcation resulting in severe stenosis of the origin of the external carotid artery but no hemodynamically significant stenosis or occlusion of the internal carotid artery. The distal internal carotid artery is patent. There is no evidence of dissection or aneurysm.  Left carotid system: The left common, internal, and external carotid arteries are patent, with scattered mild plaque but no hemodynamically significant stenosis or occlusion. There is no evidence of dissection or aneurysm. Vertebral arteries: The vertebral arteries are patent, without hemodynamically significant stenosis or occlusion there is no evidence of dissection or aneurysm. Skeleton: There is sclerosis in the C3 vertebral body, along the superior T5 endplate, and in the right third rib likely reflecting metastatic disease given the history of metastatic prostate cancer. There is no acute osseous abnormality. There is no visible canal hematoma. Other neck: The soft tissues of the neck are unremarkable. Upper chest: The imaged lung apices are clear. Review of the MIP images confirms the above findings CTA HEAD FINDINGS Anterior circulation: There is calcified plaque in the intracranial ICAs resulting in severe stenosis of the cavernous segment on the left and no greater than mild stenosis on the right. The bilateral MCAs and ACAS are patent, without proximal stenosis or occlusion. The anterior communicating artery is normal There is no aneurysm or AVM. Posterior circulation: The bilateral V4 segments are patent.  The basilar artery is patent. The right PICA origin is not definitely seen. The other major cerebellar arteries appear patent. The bilateral PCAs are patent, without proximal stenosis or occlusion. Diminutive posterior communicating arteries are identified. There is no aneurysm or AVM. Venous sinuses: Patent. Anatomic variants: None. Review of the MIP images confirms the above findings IMPRESSION: 1. Calcified plaque in the intracranial ICAs resulting in severe stenosis of the cavernous segment on the left and no greater than mild stenosis on the right. Otherwise, patent intracranial vasculature without other significant stenosis or occlusion. 2. Calcified plaque at the carotid bifurcations, right worse  than left, without significant stenosis or occlusion of the internal carotid arteries 3. Sclerotic lesions in the C3 vertebral body, T5 vertebral body, and right third rib likely reflect metastatic disease given the history of metastatic prostate cancer. Electronically Signed   By: Lesia Hausen M.D.   On: 11/26/2022 14:57   CT Head Wo Contrast  Result Date: 11/26/2022 CLINICAL DATA:  Head neck trauma. EXAM: CT HEAD WITHOUT CONTRAST CT CERVICAL SPINE WITHOUT CONTRAST TECHNIQUE: Multidetector CT imaging of the head and cervical spine was performed following the standard protocol without intravenous contrast. Multiplanar CT image reconstructions of the cervical spine were also generated. RADIATION DOSE REDUCTION: This exam was performed according to the departmental dose-optimization program which includes automated exposure control, adjustment of the mA and/or kV according to patient size and/or use of iterative reconstruction technique. COMPARISON:  None Available. FINDINGS: CT HEAD FINDINGS Brain: No hemorrhage, hydrocephalus, or masslike finding. Extensive chronic small vessel ischemic gliosis in the cerebral white matter. There is an infarct in the lateral left frontal cortex which appears recent and is new from 07/01/2022. Vascular: No hyperdense vessel or unexpected calcification. Skull: Normal. Negative for fracture or focal lesion. Sinuses/Orbits: No acute finding. CT CERVICAL SPINE FINDINGS Alignment: Normal. Skull base and vertebrae: No acute fracture. Sclerotic lesion in the left half of the C3 body, known metastatic prostate cancer. Soft tissues and spinal canal: No acute finding Disc levels:  Mild for age degenerative endplate spurring. Upper chest: Negative IMPRESSION: 1. 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  Known osseous metastatic disease. Electronically Signed   By: Tiburcio Pea M.D.   On: 11/26/2022 10:12   CT Cervical Spine Wo Contrast  Result Date:  11/26/2022 CLINICAL DATA:  Head neck trauma. EXAM: CT HEAD WITHOUT CONTRAST CT CERVICAL SPINE WITHOUT CONTRAST TECHNIQUE: Multidetector CT imaging of the head and cervical spine was performed following the standard protocol without intravenous contrast. Multiplanar CT image reconstructions of the cervical spine were also generated. RADIATION DOSE REDUCTION: This exam was performed according to the departmental dose-optimization program which includes automated exposure control, adjustment of the mA and/or kV according to patient size and/or use of iterative reconstruction technique. COMPARISON:  None Available. FINDINGS: CT HEAD FINDINGS Brain: No hemorrhage, hydrocephalus, or masslike finding. Extensive chronic small vessel ischemic gliosis in the cerebral white matter. There is an infarct in the lateral left frontal cortex which appears recent and is new from 07/01/2022. Vascular: No hyperdense vessel or unexpected calcification. Skull: Normal. Negative for fracture or focal lesion. Sinuses/Orbits: No acute finding. CT CERVICAL SPINE FINDINGS Alignment: Normal. Skull base and vertebrae: No acute fracture. Sclerotic lesion in the left half of the C3 body, known metastatic prostate cancer. Soft tissues and spinal canal: No acute finding Disc levels:  Mild for age degenerative endplate spurring. Upper chest: Negative IMPRESSION: 1. Small recent appearing cortical infarct in brain MRI  07/01/2022 2.  Known osseous metastatic disease. Electronically Signed   By: Tiburcio Pea M.D.   On: 11/26/2022 10:12   DG Chest Port 1 View  Result Date: 11/26/2022 CLINICAL DATA:  77 year old male with history of trauma from a fall. Sepsis. EXAM: PORTABLE CHEST 1 VIEW COMPARISON:  Chest x-ray 06/18/2021. FINDINGS: Low lung volumes. Linear areas of architectural distortion in the left lower lung, similar to prior study from 06/18/2021, presumably chronic scarring. No acute consolidative airspace disease. No pleural effusions. No  pneumothorax. No evidence of pulmonary edema. Heart size is borderline enlarged. The patient is rotated to the left on today's exam, resulting in distortion of the mediastinal contours and reduced diagnostic sensitivity and specificity for mediastinal pathology. Atherosclerotic calcifications are noted in the thoracic aorta. Left-sided pacemaker/AICD with lead tip projecting over the expected location of the right ventricular apex. IMPRESSION: 1. Low lung volumes without radiographic evidence of acute cardiopulmonary disease. 2. Aortic atherosclerosis. Electronically Signed   By: Trudie Reed M.D.   On: 11/26/2022 09:46    Vitals:   11/26/22 2039 11/27/22 0000 11/27/22 0400 11/27/22 0739  BP: (!) 145/80 131/80 125/77 117/78  Pulse: 82 76 82 76  Resp: (!) 22 19 19 20   Temp: 98.1 F (36.7 C)  98.2 F (36.8 C) 98 F (36.7 C)  TempSrc: Oral  Oral Oral  SpO2: 97% 94% 95% 94%    PHYSICAL EXAM General: No acute distress, laying in bed, Psych:  Mood and affect appropriate for situation CV: Regular rate and rhythm on monitor Respiratory:  Regular, unlabored respirations on room air GI: Abdomen soft and nontender   NEURO:  Mental Status: Alert and oriented to self, situation but year or month.  This is his baseline per family. Speech/Language: speech is dysarthric, hesitant, some stuttering.  Naming, repetition, and fluency intact.  Cranial Nerves:  II: PERRL. Visual fields full.  III, IV, VI: EOMI. Eyelids elevate symmetrically.  V: Sensation is intact to light touch and symmetrical to face.  VII: Face is symmetrical resting and smiling VIII: hearing intact to voice. IX, X: Palate elevates symmetrically. Phonation is normal.  FA:[id number] shrug 5/5. XII: tongue is midline without fasciculations. Motor: 5/5 strength to all muscle groups tested.  Tone: is normal and bulk is normal Sensation- Intact to light touch bilaterally. Coordination: FTN intact bilaterally Gait-  deferred   ASSESSMENT/PLAN  Left frontal cortex infarct Etiology: Workup ongoing.  Awaiting echo read.  MRI will be administered tomorrow 9/23, as patient has an ICD and cannot be scanned over the weekend.  Primary concern is potential for further metastatic spread of prostate cancer.  Patient also has severe left ICA stenosis.  If MRI is unremarkable, we will start DAPT for 3 weeks followed by aspirin monotherapy indefinitely.  Code Stroke CT head: Small recent appearing cortical infarct and brain MRI 07/01/2022. CTA head & neck: Calcified plaque in the intracranial ICA's resulting in severe stenosis of the cavernous segment on the left and no greater than mild stenosis on the right.  Otherwise, patient intracranial vasculature without other significant stenosis or occlusion.  Calcified plaque at the carotid bifurcations, right worse than left, without significant stenosis or occlusion of the internal carotid arteries.  Sclerotic lesion in the C3 vertebral body, T5 vertebral body, and right third rib likely reflect metastatic disease given the history of metastatic prostate cancer. EEG- This study is suggestive of mild diffuse encephalopathy. No seizures or epileptiform discharges were seen throughout the recording  MRI  : Scheduled  for 9/23 in the setting of ICD 2D Echo with enhancement awaiting LDL: 66 HgbA1c 7.1 VTE prophylaxis -subcu Lovenox 40 Aspirin 81 mg daily at home, continuing aspirin 81 mg.  If appropriate will start DAPT after MRI. Therapy recommendations: Appreciate PT/OT recs Disposition: None  Hypertension Home meds: Coreg 3.25 twice daily Stable  Diabetes type II Uncontrolled HgbA1c 7.1, goal < 7.0 CBGs Very sensitive SSI Recommend close follow-up with PCP for better DM control  CHF (45-50%) Agree with continue Coreg and Aldactone Consider repeat TTE, last 10/22  Other Stroke Risk Factors Advanced age Hypertension Coronary artery disease Tobacco use  history  Hospital day # 1  ATTENDING ATTESTATION:  77 year old gentleman with appears to be a left frontal CVA on CT.  His history of CHF with ICD placement will need to get MRI done on Monday with and without contrast due to his history of prostate cancer to ensure there is no mets to the brain.  Aspirin for now can likely add DAPT therapy after MRI is completed.  EEG is negative.  Ongoing stroke workup with PT OT speech therapy.  Neurology will continue to follow.  Dr. Viviann Spare evaluated pt independently, reviewed imaging, chart, labs. Discussed and formulated plan with the Resident/APP. Changes were made to the note where appropriate. Please see APP/resident note above for details.   Total 36 minutes spent on counseling patient and coordinating care, writing notes and reviewing chart.  Miyoko Hashimi,MD    To contact Stroke Continuity provider, please refer to [email or web address]. After hours, contact General Neurology

## 2022-11-27 NOTE — Progress Notes (Signed)
  Echocardiogram 2D Echocardiogram has been performed.  Delcie Roch 11/27/2022, 10:31 AM

## 2022-11-27 NOTE — Evaluation (Signed)
Physical Therapy Evaluation Patient Details Name: Matthew Mcgrath MRN: [id number] DOB: 1945/10/13 Today's Date: 11/27/2022  History of Present Illness  77 yo admitted from home with confusion, slurred speech and facial droop; found down on floor s/p fall. Pt with acute encephalopathy;L frontal lobe CVA. 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  Clinical Impression  Patient presents with dependencies in gait and transfers, requiring mod assist for most mobility.  PTA patient was modified independent with mobility with RW or at w/c level and attended [facility] 3 days/week and had a caregiver 2 hours/day on the other days.   Patient will benefit from PT while in hospital to progress mobility and independence.  Recommend patient seek F/U PT in post-acute inpatient setting <3 hours/day.        If plan is discharge home, recommend the following: A lot of help with walking and/or transfers;A lot of help with bathing/dressing/bathroom;Assistance with cooking/housework;Direct supervision/assist for medications management;Help with stairs or ramp for entrance;Assist for transportation   Can travel by private vehicle   No    Equipment Recommendations Rolling walker (2 wheels)  Recommendations for Other Services       Functional Status Assessment Patient has had a recent decline in their functional status and demonstrates the ability to make significant improvements in function in a reasonable and predictable amount of time.     Precautions / Restrictions Precautions Precautions: Fall Restrictions Weight Bearing Restrictions: No      Mobility  Bed Mobility Overal bed mobility: Needs Assistance Bed Mobility: Supine to Sit, Sit to Supine     Supine to sit: Mod assist Sit to supine: Mod assist        Transfers Overall transfer level: Needs assistance Equipment used: Rolling walker (2 wheels) Transfers: Sit to/from Stand, Bed to chair/wheelchair/BSC Sit to  Stand: Mod assist   Step pivot transfers: Mod assist, +2 safety/equipment            Ambulation/Gait               General Gait Details: not ready for gait  Stairs            Wheelchair Mobility     Tilt Bed    Modified Rankin (Stroke Patients Only)       Balance Overall balance assessment: Needs assistance, History of Falls Sitting-balance support: No upper extremity supported, Feet supported Sitting balance-Leahy Scale: Fair     Standing balance support: Bilateral upper extremity supported, During functional activity, Reliant on assistive device for balance Standing balance-Leahy Scale: Poor                               Pertinent Vitals/Pain Pain Assessment Pain Assessment: No/denies pain    Home Living Family/patient expects to be discharged to:: Private residence Living Arrangements: Alone Available Help at Discharge: Family;Personal care attendant;Available PRN/intermittently Type of Home: Apartment Home Access: Level entry       Home Layout: One level Home Equipment: Agricultural consultant (2 wheels);Shower seat;Wheelchair - manual      Prior Function Prior Level of Function : Independent/Modified Independent             Mobility Comments: uses a RW or w/c ADLs Comments: able to complete simple ADL tasks and meal prep; PCA T/TH 2 hr/day for IADL tasks; goes to [facility] MWF     Extremity/Trunk Assessment   Upper Extremity Assessment Upper Extremity Assessment:  Generalized weakness    Lower Extremity Assessment Lower Extremity Assessment: Generalized weakness    Cervical / Trunk Assessment Cervical / Trunk Assessment: Kyphotic;Other exceptions (forward head)  Communication   Communication Communication: No apparent difficulties  Cognition Arousal: Lethargic Behavior During Therapy: Flat affect Overall Cognitive Status: Impaired/Different from baseline Area of Impairment: Orientation, Attention, Memory, Following  commands, Safety/judgement, Awareness, Problem solving                 Orientation Level: Disoriented to, Place, Time, Situation Current Attention Level: Sustained Memory: Decreased short-term memory Following Commands: Follows one step commands with increased time Safety/Judgement: Decreased awareness of safety, Decreased awareness of deficits Awareness: Intellectual Problem Solving: Slow processing, Decreased initiation General Comments: unaware he was soinled; distracted at times by his cords/lines        General Comments      Exercises     Assessment/Plan    PT Assessment Patient needs continued PT services  PT Problem List Decreased strength;Decreased activity tolerance;Decreased balance;Decreased knowledge of use of DME;Decreased cognition;Decreased mobility       PT Treatment Interventions DME instruction;Gait training;Functional mobility training;Therapeutic activities;Patient/family education;Cognitive remediation;Neuromuscular re-education;Balance training;Therapeutic exercise    PT Goals (Current goals can be found in the Care Plan section)  Acute Rehab PT Goals Patient Stated Goal: none stated PT Goal Formulation: With patient Time For Goal Achievement: 12/11/22 Potential to Achieve Goals: Good    Frequency Min 1X/week     Co-evaluation               AM-PAC PT "6 Clicks" Mobility  Outcome Measure Help needed turning from your back to your side while in a flat bed without using bedrails?: A Lot Help needed moving from lying on your back to sitting on the side of a flat bed without using bedrails?: A Lot Help needed moving to and from a bed to a chair (including a wheelchair)?: A Lot Help needed standing up from a chair using your arms (e.g., wheelchair or bedside chair)?: A Lot Help needed to walk in hospital room?: A Lot Help needed climbing 3-5 steps with a railing? : Total 6 Click Score: 11    End of Session Equipment Utilized During  Treatment: Gait belt Activity Tolerance: Patient tolerated treatment well Patient left: in bed;with call bell/phone within reach;with bed alarm set   PT Visit Diagnosis: Unsteadiness on feet (R26.81);Other abnormalities of gait and mobility (R26.89);Muscle weakness (generalized) (M62.81);History of falling (Z91.81);Difficulty in walking, not elsewhere classified (R26.2)    Time: 7829-5621 PT Time Calculation (min) (ACUTE ONLY): 42 min   Charges:   PT Evaluation $PT Eval Moderate Complexity: 1 Mod PT Treatments $Therapeutic Activity: 23-37 mins PT General Charges $$ ACUTE PT VISIT: 1 Visit         11/27/2022 Delray Alt, PT Acute Rehabilitation Services Office:  [phone]   Olivia Canter 11/27/2022, 4:47 PM

## 2022-11-27 NOTE — Evaluation (Signed)
Occupational Therapy Evaluation Patient Details Name: Matthew Mcgrath MRN: [id number] DOB: 01/09/1946 Today's Date: 11/27/2022   History of Present Illness 77 yo admitted from home with confusion, slurred speech and facial droop; found down on floor s/p fall. Pt with acute encephalopathy;L frontal lobe CVA. 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   Clinical Impression   PTA pt lives alone, [facility] and has a PCA T Th  -2 hrs.day to assist with IADL tasks. At baseline, Matthew Mcgrath is able to complete mobility and his basic self care and simple meal prep @ modified independent level using a RW or w/c. On entry to room, pt unaware of being incontinent of loose watery BM. Sister states this is "not normal" for him. Sister states he has been having more difficulty with his "thinking" and they are concerned about his ability to take care of himself. Pt currently requires mod A +2 for limited mobility to the recliner and overall Max to total A with LB ADL tasks due to below listed deficits. Patient will benefit from continued inpatient follow up therapy, <3 hours/day. Acute OT to follow.       If plan is discharge home, recommend the following: Two people to help with walking and/or transfers;Two people to help with bathing/dressing/bathroom;Assistance with cooking/housework;Direct supervision/assist for medications management;Direct supervision/assist for financial management;Assist for transportation;Help with stairs or ramp for entrance    Functional Status Assessment  Patient has had a recent decline in their functional status and demonstrates the ability to make significant improvements in function in a reasonable and predictable amount of time.  Equipment Recommendations  None recommended by OT    Recommendations for Other Services PT consult     Precautions / Restrictions Precautions Precautions: Fall      Mobility Bed Mobility Overal  bed mobility: Needs Assistance Bed Mobility: Supine to Sit     Supine to sit: Max assist          Transfers Overall transfer level: Needs assistance Equipment used: Rolling walker (2 wheels) Transfers: Sit to/from Stand, Bed to chair/wheelchair/BSC Sit to Stand: Mod assist     Step pivot transfers: Mod assist, +2 safety/equipment            Balance Overall balance assessment: Needs assistance, History of Falls   Sitting balance-Leahy Scale: Fair       Standing balance-Leahy Scale: Poor                             ADL either performed or assessed with clinical judgement   ADL Overall ADL's : Needs assistance/impaired Eating/Feeding: Supervision/ safety;Set up;Sitting   Grooming: Minimal assistance   Upper Body Bathing: Moderate assistance   Lower Body Bathing: Maximal assistance   Upper Body Dressing : Maximal assistance   Lower Body Dressing: Maximal assistance;Sit to/from stand   Toilet Transfer: +2 for physical assistance;Moderate assistance;Stand-pivot (simulated)   Toileting- Clothing Manipulation and Hygiene: Total assistance (incontinent of BM)       Functional mobility during ADLs: Moderate assistance;+2 for physical assistance;Rolling walker (2 wheels)       Vision Baseline Vision/History: 0 No visual deficits Vision Assessment?: Yes Eye Alignment: Within Functional Limits Ocular Range of Motion: Within Functional Limits Alignment/Gaze Preference: Within Defined Limits Tracking/Visual Pursuits: Decreased smoothness of horizontal tracking;Decreased smoothness of vertical tracking Saccades: Additional eye shifts occurred during testing;Additional head turns occurred during testing;Decreased speed  of saccadic movement Additional Comments: poor visual attention     Perception         Praxis         Pertinent Vitals/Pain Pain Assessment Pain Assessment: No/denies pain     Extremity/Trunk Assessment Upper Extremity  Assessment Upper Extremity Assessment: Generalized weakness   Lower Extremity Assessment Lower Extremity Assessment: Defer to PT evaluation   Cervical / Trunk Assessment Cervical / Trunk Assessment: Kyphotic;Other exceptions (forward head)   Communication Communication Communication: No apparent difficulties   Cognition Arousal: Lethargic Behavior During Therapy: Flat affect Overall Cognitive Status: Impaired/Different from baseline Area of Impairment: Orientation, Attention, Memory, Following commands, Safety/judgement, Awareness, Problem solving                 Orientation Level: Disoriented to, Place, Time, Situation Current Attention Level: Sustained Memory: Decreased short-term memory Following Commands: Follows one step commands with increased time Safety/Judgement: Decreased awareness of safety, Decreased awareness of deficits Awareness: Intellectual Problem Solving: Slow processing, Decreased initiation General Comments: unaware he was soinled; distracted at times by his cords/lines     General Comments   Sister states Matthew Mcgrath laid on the floor all night before he pushed his button for help    Exercises     Shoulder Instructions      Home Living Family/patient expects to be discharged to:: Private residence Living Arrangements: Alone Available Help at Discharge: Family;Personal care attendant;Available PRN/intermittently Type of Home: Apartment Home Access: Level entry     Home Layout: One level     Bathroom Shower/Tub: Chief Strategy Officer: Standard Bathroom Accessibility: Yes How Accessible: Accessible via walker Home Equipment: Rolling Walker (2 wheels);Shower seat;Wheelchair - manual      Lives With: Alone    Prior Functioning/Environment Prior Level of Function : Independent/Modified Independent             Mobility Comments: uses a RW or w/c ADLs Comments: able to complete simple ADL tasks and meal prep; PCA T/TH 2  hr/day for IADL tasks; goes to [employer] MWF        OT Problem List: Decreased strength;Decreased activity tolerance;Impaired balance (sitting and/or standing);Decreased cognition;Decreased safety awareness;Impaired UE functional use      OT Treatment/Interventions: Self-care/ADL training    OT Goals(Current goals can be found in the care plan section) Acute Rehab OT Goals Patient Stated Goal: family goal is for rehab OT Goal Formulation: With patient/family Time For Goal Achievement: 12/11/22 Potential to Achieve Goals: Good  OT Frequency: Min 1X/week    Co-evaluation              AM-PAC OT "6 Clicks" Daily Activity     Outcome Measure Help from another person eating meals?: A Little Help from another person taking care of personal grooming?: A Little Help from another person toileting, which includes using toliet, bedpan, or urinal?: Total Help from another person bathing (including washing, rinsing, drying)?: A Lot Help from another person to put on and taking off regular upper body clothing?: A Lot Help from another person to put on and taking off regular lower body clothing?: A Lot 6 Click Score: 13   End of Session Equipment Utilized During Treatment: Gait belt;Rolling walker (2 wheels) Nurse Communication: Mobility status  Activity Tolerance: Patient tolerated treatment well Patient left: in chair;with call bell/phone within reach;with chair alarm set;with family/visitor present  OT Visit Diagnosis: Unsteadiness on feet (R26.81);Repeated falls (R29.6);Muscle weakness (generalized) (M62.81);Other symptoms and signs involving cognitive function  Time: 1610-9604 OT Time Calculation (min): 29 min Charges:  OT General Charges $OT Visit: 1 Visit OT Evaluation $OT Eval Moderate Complexity: 1 Mod OT Treatments $Self Care/Home Management : 8-22 mins  Luisa Dago, OT/L   Acute OT Clinical Specialist Acute Rehabilitation Services Pager [phone] Office  [phone]   [facility] 11/27/2022, 3:20 PM

## 2022-11-27 NOTE — Evaluation (Signed)
Speech Language Pathology Evaluation Patient Details Name: Matthew Mcgrath MRN: [id number] DOB: 1945-11-21 Today's Date: 11/27/2022 Time: 1305-1330 SLP Time Calculation (min) (ACUTE ONLY): 25 min  Problem List:  Patient Active Problem List   Diagnosis Date Noted   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   Stroke (HCC) 11/26/2022   Acute encephalopathy 11/26/2022   Lower extremity weakness 06/28/2022   Cognitive impairment 05/17/2022   Anemia    Sepsis (HCC) 06/19/2021   Gross hematuria 06/19/2021   Complicated UTI (urinary tract infection) 06/18/2021   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   Genetic testing 02/22/2021   Family history of colon cancer 01/13/2021   Family history of breast cancer 01/13/2021   Encounter for antineoplastic chemotherapy 07/06/2020   Metastatic cancer to bone (HCC) 07/06/2020   Metastasis to bone (HCC) 05/28/2020   Prostate cancer (HCC) 05/11/2020   Goals of care, counseling/discussion 05/11/2020   Iliac artery aneurysm (HCC) 04/12/2016   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   Cardiomyopathy (HCC) 04/24/2015   S/P cardiac catheterization 04/24/2015   Chest pain 12/23/2014   Diabetes (HCC) 11/21/2014   Chronic systolic heart failure (HCC) 11/20/2014   Tobacco abuse 11/20/2014   Pedal edema 11/03/2014   Acute on chronic combined systolic and diastolic congestive heart failure (HCC) 09/20/2014   Elevation of level of transaminase and lactic acid dehydrogenase (LDH) 09/20/2014   Essential (primary) hypertension 05/02/2014   Hyperlipidemia 05/02/2014   Shortness of breath 05/02/2014   Urinary tract infection 05/02/2014   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   Past Medical History:  Past Medical History:  Diagnosis Date   Cardiomyopathy (HCC)    Congestive heart failure (HCC)    Diabetes mellitus type 2, controlled (HCC)    Elevated lipids    Family history of breast cancer    Family history of  colon cancer    HTN (hypertension)    Past Surgical History:  Past Surgical History:  Procedure Laterality Date   CARDIAC CATHETERIZATION N/A 04/14/2015   Procedure: Left Heart Cath and Coronary Angiography;  Surgeon: Marcina Millard, MD;  Location: [facility] INVASIVE CV LAB;  Service: Cardiovascular;  Laterality: N/A;   CATARACT EXTRACTION W/ INTRAOCULAR LENS  IMPLANT, BILATERAL Left    COLONOSCOPY WITH PROPOFOL N/A 04/08/2020   Procedure: COLONOSCOPY WITH PROPOFOL;  Surgeon: Toledo, Boykin Nearing, MD;  Location: [facility] ENDOSCOPY;  Service: Gastroenterology;  Laterality: N/A;   CYSTOSCOPY W/ RETROGRADES Bilateral 07/02/2021   Procedure: CYSTOSCOPY WITH RETROGRADE PYELOGRAM;  Surgeon: Sondra Come, MD;  Location: [facility] ORS;  Service: Urology;  Laterality: Bilateral;   HERNIA REPAIR     IMPLANTABLE CARDIOVERTER DEFIBRILLATOR (ICD) GENERATOR CHANGE Left 07/03/2015   Procedure: ICD IMPLANT single chamber;  Surgeon: Sharion Settler, MD;  Location: [facility] ORS;  Service: Cardiovascular;  Laterality: Left;   TRANSURETHRAL RESECTION OF BLADDER TUMOR N/A 07/02/2021   Procedure: TRANSURETHRAL RESECTION OF BLADDER TUMOR (TURBT);  Surgeon: Sondra Come, MD;  Location: [facility] ORS;  Service: Urology;  Laterality: N/A;   HPI:  Patient is a 77 y.o. male with PMH: DM-2, HTN, cardiomyopathy with ICD, prostate cancer metastatic to bone, chronic right lower extremity weakness. He presented to the ED on 11/26/2022 after an unwittnessed fall the previous afternoon or evening. At baseline he lives by himself, uses a wheelchair or walker and per family report during SLP evaluation, has a 4th grade intellect. On day of admission, family went to check on him and found him on the floor with confusion, slurred speech  and facial droop. In ED [facility]) CT head showed left frontal cortex infarction, HR, BP, SpO2, EKG and CXR all normal. He was transferred to [facility] for neurology evaluation and secondary to need for MRI with patient's ICD.    Assessment / Plan / Recommendation Clinical Impression  Patient presents with mild cognitive decline as compared to his baseline as reported by family. Per his brother and sister who were in the room, patient has h/o cognitive impairment with intellect being about "4th grade", h/o stutter. He lives by himself but has a medical alert button 'watch' which he does use appropriately. As per this evaluation, patient with dysarthria and cognitive impairment. Although no significant asymmetry of face, patient's oral motor movements were slow (as were all of his motor movements) and in addition, he is missing a lot of dentition which impacted his speech intelligibility. Cues to speak louder and slower did help some at word level but overall speech intelligibility at word and short phrase level was less than 50% when context was not known. He was oriented to self and generally to place, stating "a hospital" and then that the hospital was in the city of [place]". He was not able to state the month, date, day of week or year. He was able to identify family members in room ,but did not seem to notice his brother who was sitting on left side. Affect was flat, voice was monotone and patient required cues to initiate any physical movement or verbal response. SLP will follow briefly while admitted and recommending skilled SLP services at next venue of care as well.    SLP Assessment  SLP Recommendation/Assessment: Patient needs continued Speech Lanaguage Pathology Services SLP Visit Diagnosis: Dysarthria and anarthria (R47.1);Cognitive communication deficit (R41.841)    Recommendations for follow up therapy are one component of a multi-disciplinary discharge planning process, led by the attending physician.  Recommendations may be updated based on patient status, additional functional criteria and insurance authorization.    Follow Up Recommendations  Skilled nursing-short term rehab (<3 hours/day)     Assistance Recommended at Discharge  Frequent or constant Supervision/Assistance  Functional Status Assessment Patient has had a recent decline in their functional status and demonstrates the ability to make significant improvements in function in a reasonable and predictable amount of time.  Frequency and Duration min 1 x/week  2 weeks      SLP Evaluation Cognition  Overall Cognitive Status: Impaired/Different from baseline Arousal/Alertness: Awake/alert Orientation Level: Oriented to person;Oriented to place;Disoriented to time;Disoriented to situation Attention: Sustained Sustained Attention: Impaired Sustained Attention Impairment: Verbal basic Memory: Impaired Memory Impairment: Retrieval deficit;Decreased short term memory Decreased Short Term Memory: Verbal basic Awareness: Impaired Awareness Impairment: Intellectual impairment Problem Solving: Impaired Problem Solving Impairment: Verbal basic Safety/Judgment: Impaired       Comprehension  Auditory Comprehension Overall Auditory Comprehension: Other (comment) (WFL at basic level)    Expression Expression Primary Mode of Expression: Verbal Verbal Expression Overall Verbal Expression: Impaired at baseline   Oral / Motor  Oral Motor/Sensory Function Overall Oral Motor/Sensory Function: Within functional limits Motor Speech Overall Motor Speech: Impaired Respiration: Within functional limits Phonation: Normal Resonance: Within functional limits Articulation: Impaired Level of Impairment: Phrase Intelligibility: Intelligibility reduced Word: 50-74% accurate Phrase: 50-74% accurate Sentence: 25-49% accurate Conversation: Not tested Motor Planning: Witnin functional limits Motor Speech Errors: Not applicable Interfering Components: Premorbid status;Inadequate dentition Effective Techniques: Slow rate;Increased vocal intensity           Angela Nevin, MA, CCC-SLP Speech  Therapy

## 2022-11-27 NOTE — Procedures (Signed)
Patient Name: Matthew Mcgrath  MRN: [id number]  Epilepsy Attending: Charlsie Quest  Referring Physician/Provider: Briscoe Deutscher, MD  Date: 11/27/2022 Duration: 27.14 mins  Patient history: 77yo M with ams getting eeg to evaluate for seizure  Level of alertness: Awake, asleep  AEDs during EEG study: None  Technical aspects: This EEG study was done with scalp electrodes positioned according to the 10-20 International system of electrode placement. Electrical activity was reviewed with band pass filter of 1-70Hz , sensitivity of 7 uV/mm, display speed of 59mm/sec with a 60Hz  notched filter applied as appropriate. EEG data were recorded continuously and digitally stored.  Video monitoring was available and reviewed as appropriate.  Description: The posterior dominant rhythm consists of 7.5 Hz activity of moderate voltage (25-35 uV) seen predominantly in posterior head regions, symmetric and reactive to eye opening and eye closing. Sleep was characterized by vertex waves, sleep spindles (12 to 14 Hz), maximal frontocentral region. EEG showed continuous generalized 5 to 6 Hz theta slowing. Hyperventilation and photic stimulation were not performed.     ABNORMALITY - Continuous slow, generalized  IMPRESSION: This study is suggestive of mild diffuse encephalopathy. No seizures or epileptiform discharges were seen throughout the recording.  Zavian Slowey Annabelle Harman

## 2022-11-27 NOTE — Progress Notes (Signed)
PROGRESS NOTE    Matthew TY  Mcgrath:[id number] DOB: 12-15-1945 DOA: 11/26/2022 PCP: [facility]    No chief complaint on file.   Brief Narrative:  Matthew Mcgrath is a 77 y.o. male with medical history significant for type 2 diabetes mellitus, hypertension, cardiomyopathy with ICD, prostate cancer metastatic to bone, and chronic right lower extremity weakness who presented to the ED after a fall sometime yesterday afternoon or evening.   Patient lives alone, uses a wheelchair or walker at baseline, and was found on the floor today with confusion, slurred speech, and facial droop noted by family.  Patient stated that he fell yesterday but has trouble recalling the details surrounding it.  He was too weak to get up on his own.    He denies chest pain, fevers, headache, or any new focal numbness or weakness.   [facility] ED Course: Upon arrival to the ED, patient is found to be afebrile and saturating well on room air with normal heart rate and stable blood pressure.  EKG demonstrates sinus rhythm.  Chest x-ray is negative for acute cardiopulmonary disease.  Head CT is notable for left frontal cortex infarction that is new from the MRI in April 2024.   Patient was evaluated by neurology and medical admission to [facility] was recommended as MRI cannot be performed at [facility] due to the patient's ICD.   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 -MRI of  the brain with and without contrast is pending, to be done tomorrow given presence of AICD.   -Continue with aspirin, await further recommendation from neurology . -A1C7.1, LDL acceptable at 66-follow-up on 2D echo   -EEG significant for encepalopathy, but no seizure activities -Await further recommendation from neurology   2. Prostate cancer  - Castration resistant, metastatic to bone, s/p palliative RT  - Currently on olaparib under the care of Dr. Cathie Hoops    3. Type II DM  -A1c 7.1 this admission - Check CBGs and use low-intensity SSI for now     4. Chronic HFmrEF  - EF 45-50% on TTE from October 2022  - Appears compensated  - Continue Coreg and Aldactone, monitor volume status      DVT prophylaxis: Lovenox Code Status: Full Family Communication: none at bedside Disposition:    Consultants:  neurology   Subjective:  No significant events overnight as discussed with staff, patient denies any complaints today  Objective: Vitals:   11/27/22 0000 11/27/22 0400 11/27/22 0739 11/27/22 0800  BP: 131/80 125/77 117/78 116/87  Pulse: 76 82 76 97  Resp: 19 19 20  (!) 25  Temp:  98.2 F (36.8 C) 98 F (36.7 C)   TempSrc:  Oral Oral   SpO2: 94% 95% 94% 95%    Intake/Output Summary (Last 24 hours) at 11/27/2022 1049 Last data filed at 11/27/2022 0300 Gross per 24 hour  Intake --  Output 500 ml  Net -500 ml   There  were no vitals filed for this visit.  Examination:  Awake Alert, Oriented, conversant, but slowed to speak, with mild dysarthria Symmetrical Chest wall movement, Good air movement bilaterally, CTAB RRR,No Gallops,Rubs or new Murmurs, No Parasternal Heave +ve B.Sounds, Abd Soft, No tenderness, No rebound - guarding or rigidity. No Cyanosis, Clubbing or edema, No new Rash or bruise      Data Reviewed: I have personally reviewed following labs and imaging studies  CBC: Recent Labs  Lab 11/26/22 0932 11/27/22 0733  WBC 4.9 3.5*  NEUTROABS 4.1  --   HGB  11.8* 9.2*  HCT 36.4* 28.7*  MCV 100.8* 99.7  PLT 184 165    Basic Metabolic Panel: Recent Labs  Lab 11/26/22 0932 11/27/22 0733  NA 134* 137  K 5.1 3.4*  CL 102 107  CO2 22 21*  GLUCOSE 141* 165*  BUN 19 11  CREATININE 0.77 0.70  CALCIUM 9.4 8.6*    GFR: Estimated Creatinine Clearance: 87.6 mL/min (by C-G formula based on SCr of 0.7 mg/dL).  Liver Function Tests: Recent Labs  Lab 11/26/22 0932  AST 38  ALT 12  ALKPHOS 63  BILITOT 1.6*  PROT 8.1  ALBUMIN 4.0    CBG: Recent Labs  Lab 11/26/22 1646 11/26/22 2204 11/27/22 0739  GLUCAP 207* 131* 162*     Recent Results (from the past 240 hour(s))  Blood Culture (routine x 2)     Status: None (Preliminary result)   Collection Time: 11/26/22  9:32 AM   Specimen: BLOOD  Result Value Ref Range Status   Specimen Description BLOOD BLOOD LEFT ARM  Final   Special Requests   Final    BOTTLES DRAWN AEROBIC AND ANAEROBIC Blood Culture adequate volume   Culture  Setup Time   Final    GRAM POSITIVE COCCI AEROBIC BOTTLE ONLY Organism ID to follow CRITICAL RESULT CALLED TO, READ BACK BY AND VERIFIED WITH: Gustavus Messing RN ED @ [phone] 11/27/22 LFD Performed at [facility], [address], [place], [place] [address]    Culture GRAM POSITIVE COCCI  Final   Report Status PENDING  Incomplete  SARS Coronavirus 2 by RT PCR (hospital order, performed in [facility] hospital lab) *cepheid single result test* Anterior Nasal Swab     Status: None   Collection Time: 11/26/22  9:32 AM   Specimen: Anterior Nasal Swab  Result Value Ref Range Status   SARS Coronavirus 2 by RT PCR NEGATIVE NEGATIVE Final    Comment: (NOTE) SARS-CoV-2 target nucleic acids are NOT DETECTED.  The SARS-CoV-2 RNA is generally detectable in upper and lower respiratory specimens during the acute phase of infection. The lowest concentration of SARS-CoV-2 viral copies this assay can detect is 250 copies / mL. A negative result does not preclude  SARS-CoV-2 infection and should not be used as the sole basis for treatment or other patient management decisions.  A negative result may occur with improper specimen collection / handling, submission of specimen other than nasopharyngeal swab, presence of viral mutation(s) within the areas targeted by this assay, and inadequate number of viral copies (<250 copies / mL). A negative result must be combined with clinical observations, patient history, and epidemiological information.  Fact Sheet for Patients:   RoadLapTop.co.za  Fact Sheet for Healthcare Providers: http://kim-miller.com/  This test is not yet approved or  cleared by the [place] FDA and has been authorized for detection and/or diagnosis of SARS-CoV-2 by FDA under an Emergency Use Authorization (EUA).  This EUA  will remain in effect (meaning this test can be used) for the duration of the COVID-19 declaration under Section 564(b)(1) of the Act, 21 U.S.C. section 360bbb-3(b)(1), unless the authorization is terminated or revoked sooner.  Performed at [facility], [address] [place], [place] [address]   Blood Culture ID Panel (Reflexed)     Status: None   Collection Time: 11/26/22  9:32 AM  Result Value Ref Range Status   Enterococcus faecalis NOT DETECTED NOT DETECTED Final   Enterococcus Faecium NOT DETECTED NOT DETECTED Final   Listeria monocytogenes NOT DETECTED NOT DETECTED Final   Staphylococcus species NOT DETECTED NOT DETECTED Final   Staphylococcus aureus (BCID) NOT DETECTED NOT DETECTED Final   Staphylococcus epidermidis NOT DETECTED NOT DETECTED Final   Staphylococcus lugdunensis NOT DETECTED NOT DETECTED Final   Streptococcus species NOT DETECTED NOT DETECTED Final   Streptococcus agalactiae NOT DETECTED NOT DETECTED Final   Streptococcus pneumoniae NOT DETECTED NOT DETECTED Final   Streptococcus pyogenes NOT DETECTED NOT DETECTED Final    A.calcoaceticus-baumannii NOT DETECTED NOT DETECTED Final   Bacteroides fragilis NOT DETECTED NOT DETECTED Final   Enterobacterales NOT DETECTED NOT DETECTED Final   Enterobacter cloacae complex NOT DETECTED NOT DETECTED Final   Escherichia coli NOT DETECTED NOT DETECTED Final   Klebsiella aerogenes NOT DETECTED NOT DETECTED Final   Klebsiella oxytoca NOT DETECTED NOT DETECTED Final   Klebsiella pneumoniae NOT DETECTED NOT DETECTED Final   Proteus species NOT DETECTED NOT DETECTED Final   Salmonella species NOT DETECTED NOT DETECTED Final   Serratia marcescens NOT DETECTED NOT DETECTED Final   Haemophilus influenzae NOT DETECTED NOT DETECTED Final   Neisseria meningitidis NOT DETECTED NOT DETECTED Final   Pseudomonas aeruginosa NOT DETECTED NOT DETECTED Final   Stenotrophomonas maltophilia NOT DETECTED NOT DETECTED Final   Candida albicans NOT DETECTED NOT DETECTED Final   Candida auris NOT DETECTED NOT DETECTED Final   Candida glabrata NOT DETECTED NOT DETECTED Final   Candida krusei NOT DETECTED NOT DETECTED Final   Candida parapsilosis NOT DETECTED NOT DETECTED Final   Candida tropicalis NOT DETECTED NOT DETECTED Final   Cryptococcus neoformans/gattii NOT DETECTED NOT DETECTED Final    Comment: Performed at [facility], [address], [place], [place] [address]         Radiology Studies: EEG adult  Result Date: 12/09/22 Charlsie Quest, MD     09-Dec-2022  5:45 AM Patient Name: DAYCEN KUNDERT MRN: [id number] Epilepsy Attending: Charlsie Quest Referring Physician/Provider: Briscoe Deutscher, MD Date: 09-Dec-2022 Duration: 27.14 mins Patient history: 77yo M with ams getting eeg to evaluate for seizure Level of alertness: Awake, asleep AEDs during EEG study: None Technical aspects: This EEG study was done with scalp electrodes positioned according to the 10-20 International system of electrode placement. Electrical activity was reviewed with band pass filter of 1-70Hz ,  sensitivity of 7 uV/mm, display speed of 26mm/sec with a 60Hz  notched filter applied as appropriate. EEG data were recorded continuously and digitally stored.  Video monitoring was available and reviewed as appropriate. Description: The posterior dominant rhythm consists of 7.5 Hz activity of moderate voltage (25-35 uV) seen predominantly in posterior head regions, symmetric and reactive to eye opening and eye closing. Sleep was characterized by vertex waves, sleep spindles (12 to 14 Hz), maximal frontocentral region. EEG showed continuous generalized 5 to 6 Hz theta slowing. Hyperventilation and photic stimulation were not performed.   ABNORMALITY - Continuous slow, generalized IMPRESSION: This study is suggestive  of mild diffuse encephalopathy. No seizures or epileptiform discharges were seen throughout the recording. Charlsie Quest   CT ANGIO HEAD NECK W WO CM  Result Date: 11/26/2022 CLINICAL DATA:  Neuro deficit, stroke suspected EXAM: CT ANGIOGRAPHY HEAD AND NECK WITH AND WITHOUT CONTRAST TECHNIQUE: Multidetector CT imaging of the head and neck was performed using the standard protocol during bolus administration of intravenous contrast. Multiplanar CT image reconstructions and MIPs were obtained to evaluate the vascular anatomy. Carotid stenosis measurements (when applicable) are obtained utilizing NASCET criteria, using the distal internal carotid diameter as the denominator. RADIATION DOSE REDUCTION: This exam was performed according to the departmental dose-optimization program which includes automated exposure control, adjustment of the mA and/or kV according to patient size and/or use of iterative reconstruction technique. CONTRAST:  75mL OMNIPAQUE IOHEXOL 350 MG/ML SOLN COMPARISON:  Same-day CT head FINDINGS: CTA NECK FINDINGS Aortic arch: There is mild calcified plaque in the imaged aortic arch. The origins of the major branch vessels are patent. The subclavian arteries are patent to the level  imaged. Right carotid system: The right common carotid artery is patent. There is bulky plaque at the carotid bifurcation resulting in severe stenosis of the origin of the external carotid artery but no hemodynamically significant stenosis or occlusion of the internal carotid artery. The distal internal carotid artery is patent. There is no evidence of dissection or aneurysm. Left carotid system: The left common, internal, and external carotid arteries are patent, with scattered mild plaque but no hemodynamically significant stenosis or occlusion. There is no evidence of dissection or aneurysm. Vertebral arteries: The vertebral arteries are patent, without hemodynamically significant stenosis or occlusion there is no evidence of dissection or aneurysm. Skeleton: There is sclerosis in the C3 vertebral body, along the superior T5 endplate, and in the right third rib likely reflecting metastatic disease given the history of metastatic prostate cancer. There is no acute osseous abnormality. There is no visible canal hematoma. Other neck: The soft tissues of the neck are unremarkable. Upper chest: The imaged lung apices are clear. Review of the MIP images confirms the above findings CTA HEAD FINDINGS Anterior circulation: There is calcified plaque in the intracranial ICAs resulting in severe stenosis of the cavernous segment on the left and no greater than mild stenosis on the right. The bilateral MCAs and ACAS are patent, without proximal stenosis or occlusion. The anterior communicating artery is normal There is no aneurysm or AVM. Posterior circulation: The bilateral V4 segments are patent. The basilar artery is patent. The right PICA origin is not definitely seen. The other major cerebellar arteries appear patent. The bilateral PCAs are patent, without proximal stenosis or occlusion. Diminutive posterior communicating arteries are identified. There is no aneurysm or AVM. Venous sinuses: Patent. Anatomic variants:  None. Review of the MIP images confirms the above findings IMPRESSION: 1. Calcified plaque in the intracranial ICAs resulting in severe stenosis of the cavernous segment on the left and no greater than mild stenosis on the right. Otherwise, patent intracranial vasculature without other significant stenosis or occlusion. 2. Calcified plaque at the carotid bifurcations, right worse than left, without significant stenosis or occlusion of the internal carotid arteries 3. Sclerotic lesions in the C3 vertebral body, T5 vertebral body, and right third rib likely reflect metastatic disease given the history of metastatic prostate cancer. Electronically Signed   By: Lesia Hausen M.D.   On: 11/26/2022 14:57   CT Head Wo Contrast  Result Date: 11/26/2022 CLINICAL DATA:  Head neck trauma.  EXAM: CT HEAD WITHOUT CONTRAST CT CERVICAL SPINE WITHOUT CONTRAST TECHNIQUE: Multidetector CT imaging of the head and cervical spine was performed following the standard protocol without intravenous contrast. Multiplanar CT image reconstructions of the cervical spine were also generated. RADIATION DOSE REDUCTION: This exam was performed according to the departmental dose-optimization program which includes automated exposure control, adjustment of the mA and/or kV according to patient size and/or use of iterative reconstruction technique. COMPARISON:  None Available. FINDINGS: CT HEAD FINDINGS Brain: No hemorrhage, hydrocephalus, or masslike finding. Extensive chronic small vessel ischemic gliosis in the cerebral white matter. There is an infarct in the lateral left frontal cortex which appears recent and is new from 07/01/2022. Vascular: No hyperdense vessel or unexpected calcification. Skull: Normal. Negative for fracture or focal lesion. Sinuses/Orbits: No acute finding. CT CERVICAL SPINE FINDINGS Alignment: Normal. Skull base and vertebrae: No acute fracture. Sclerotic lesion in the left half of the C3 body, known metastatic prostate  cancer. Soft tissues and spinal canal: No acute finding Disc levels:  Mild for age degenerative endplate spurring. Upper chest: Negative IMPRESSION: 1. 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  Known osseous metastatic disease. Electronically Signed   By: Tiburcio Pea M.D.   On: 11/26/2022 10:12   CT Cervical Spine Wo Contrast  Result Date: 11/26/2022 CLINICAL DATA:  Head neck trauma. EXAM: CT HEAD WITHOUT CONTRAST CT CERVICAL SPINE WITHOUT CONTRAST TECHNIQUE: Multidetector CT imaging of the head and cervical spine was performed following the standard protocol without intravenous contrast. Multiplanar CT image reconstructions of the cervical spine were also generated. RADIATION DOSE REDUCTION: This exam was performed according to the departmental dose-optimization program which includes automated exposure control, adjustment of the mA and/or kV according to patient size and/or use of iterative reconstruction technique. COMPARISON:  None Available. FINDINGS: CT HEAD FINDINGS Brain: No hemorrhage, hydrocephalus, or masslike finding. Extensive chronic small vessel ischemic gliosis in the cerebral white matter. There is an infarct in the lateral left frontal cortex which appears recent and is new from 07/01/2022. Vascular: No hyperdense vessel or unexpected calcification. Skull: Normal. Negative for fracture or focal lesion. Sinuses/Orbits: No acute finding. CT CERVICAL SPINE FINDINGS Alignment: Normal. Skull base and vertebrae: No acute fracture. Sclerotic lesion in the left half of the C3 body, known metastatic prostate cancer. Soft tissues and spinal canal: No acute finding Disc levels:  Mild for age degenerative endplate spurring. Upper chest: Negative IMPRESSION: 1. 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  Known osseous metastatic disease. Electronically Signed   By: Tiburcio Pea M.D.   On: 11/26/2022 10:12   DG Chest Port 1 View  Result Date:  11/26/2022 CLINICAL DATA:  77 year old male with history of trauma from a fall. Sepsis. EXAM: PORTABLE CHEST 1 VIEW COMPARISON:  Chest x-ray 06/18/2021. FINDINGS: Low lung volumes. Linear areas of architectural distortion in the left lower lung, similar to prior study from 06/18/2021, presumably chronic scarring. No acute consolidative airspace disease. No pleural effusions. No pneumothorax. No evidence of pulmonary edema. Heart size is borderline enlarged. The patient is rotated to the left on today's exam, resulting in distortion of the mediastinal contours and reduced diagnostic sensitivity and specificity for mediastinal pathology. Atherosclerotic calcifications are noted in the thoracic aorta. Left-sided pacemaker/AICD with lead tip projecting over the expected location of the right ventricular apex. IMPRESSION: 1. Low lung volumes without radiographic evidence of acute cardiopulmonary disease. 2. Aortic atherosclerosis. Electronically Signed   By: Brayton Mars.D.  On: 11/26/2022 09:46        Scheduled Meds:  aspirin EC  81 mg Oral Daily   carvedilol  3.125 mg Oral BID WC   enoxaparin (LOVENOX) injection  40 mg Subcutaneous Q24H   insulin aspart  0-5 Units Subcutaneous QHS   insulin aspart  0-6 Units Subcutaneous TID WC   pantoprazole  40 mg Oral Daily   senna  2 tablet Oral Daily   sodium chloride flush  3 mL Intravenous Q12H   spironolactone  12.5 mg Oral Daily   tamsulosin  0.4 mg Oral Daily   Continuous Infusions:  sodium chloride       LOS: 1 day       Huey Bienenstock, MD [facility]   To contact the attending provider between 7A-7P or the covering provider during after hours 7P-7A, please log into the web site [email or web address] and access using universal [place] password for that web site. If you do not have the password, please call the hospital operator.  11/27/2022, 10:49 AM

## 2022-11-28 ENCOUNTER — Observation Stay (HOSPITAL_COMMUNITY): Payer: Medicare (Managed Care)

## 2022-11-28 DIAGNOSIS — Z1152 Encounter for screening for COVID-19: Secondary | ICD-10-CM | POA: Diagnosis not present

## 2022-11-28 DIAGNOSIS — Z7982 Long term (current) use of aspirin: Secondary | ICD-10-CM | POA: Diagnosis not present

## 2022-11-28 DIAGNOSIS — R29810 Facial weakness: Secondary | ICD-10-CM | POA: Diagnosis present

## 2022-11-28 DIAGNOSIS — Z794 Long term (current) use of insulin: Secondary | ICD-10-CM | POA: Diagnosis not present

## 2022-11-28 DIAGNOSIS — Z800 Family history of malignant neoplasm of digestive organs: Secondary | ICD-10-CM | POA: Diagnosis not present

## 2022-11-28 DIAGNOSIS — Z95810 Presence of automatic (implantable) cardiac defibrillator: Secondary | ICD-10-CM | POA: Diagnosis not present

## 2022-11-28 DIAGNOSIS — Z87891 Personal history of nicotine dependence: Secondary | ICD-10-CM | POA: Diagnosis not present

## 2022-11-28 DIAGNOSIS — E1165 Type 2 diabetes mellitus with hyperglycemia: Secondary | ICD-10-CM | POA: Diagnosis present

## 2022-11-28 DIAGNOSIS — F0390 Unspecified dementia without behavioral disturbance: Secondary | ICD-10-CM | POA: Diagnosis present

## 2022-11-28 DIAGNOSIS — Z8249 Family history of ischemic heart disease and other diseases of the circulatory system: Secondary | ICD-10-CM | POA: Diagnosis not present

## 2022-11-28 DIAGNOSIS — I429 Cardiomyopathy, unspecified: Secondary | ICD-10-CM | POA: Diagnosis present

## 2022-11-28 DIAGNOSIS — Z833 Family history of diabetes mellitus: Secondary | ICD-10-CM | POA: Diagnosis not present

## 2022-11-28 DIAGNOSIS — G9340 Encephalopathy, unspecified: Secondary | ICD-10-CM | POA: Diagnosis present

## 2022-11-28 DIAGNOSIS — I5022 Chronic systolic (congestive) heart failure: Secondary | ICD-10-CM | POA: Diagnosis present

## 2022-11-28 DIAGNOSIS — C7951 Secondary malignant neoplasm of bone: Secondary | ICD-10-CM | POA: Diagnosis present

## 2022-11-28 DIAGNOSIS — Z811 Family history of alcohol abuse and dependence: Secondary | ICD-10-CM | POA: Diagnosis not present

## 2022-11-28 DIAGNOSIS — E119 Type 2 diabetes mellitus without complications: Secondary | ICD-10-CM | POA: Diagnosis not present

## 2022-11-28 DIAGNOSIS — Z79899 Other long term (current) drug therapy: Secondary | ICD-10-CM | POA: Diagnosis not present

## 2022-11-28 DIAGNOSIS — Z803 Family history of malignant neoplasm of breast: Secondary | ICD-10-CM | POA: Diagnosis not present

## 2022-11-28 DIAGNOSIS — I110 Hypertensive heart disease with heart failure: Secondary | ICD-10-CM | POA: Diagnosis present

## 2022-11-28 DIAGNOSIS — Z807 Family history of other malignant neoplasms of lymphoid, hematopoietic and related tissues: Secondary | ICD-10-CM | POA: Diagnosis not present

## 2022-11-28 DIAGNOSIS — R471 Dysarthria and anarthria: Secondary | ICD-10-CM | POA: Diagnosis present

## 2022-11-28 DIAGNOSIS — C61 Malignant neoplasm of prostate: Secondary | ICD-10-CM | POA: Diagnosis present

## 2022-11-28 DIAGNOSIS — Z7984 Long term (current) use of oral hypoglycemic drugs: Secondary | ICD-10-CM | POA: Diagnosis not present

## 2022-11-28 DIAGNOSIS — I6522 Occlusion and stenosis of left carotid artery: Secondary | ICD-10-CM | POA: Diagnosis present

## 2022-11-28 DIAGNOSIS — W19XXXA Unspecified fall, initial encounter: Secondary | ICD-10-CM | POA: Diagnosis present

## 2022-11-28 LAB — BASIC METABOLIC PANEL
Anion gap: 8 (ref 5–15)
BUN: 13 mg/dL (ref 8–23)
CO2: 22 mmol/L (ref 22–32)
Calcium: 8.7 mg/dL — ABNORMAL LOW (ref 8.9–10.3)
Chloride: 105 mmol/L (ref 98–111)
Creatinine, Ser: 0.79 mg/dL (ref 0.61–1.24)
GFR, Estimated: >60 mL/min (ref >60)
Glucose, Bld: 163 mg/dL — ABNORMAL HIGH (ref 70–99)
Potassium: 3.5 mmol/L (ref 3.5–5.1)
Sodium: 135 mmol/L (ref 135–145)

## 2022-11-28 LAB — CBC
HCT: 31.1 % — ABNORMAL LOW (ref 39.0–52.0)
Hemoglobin: 10.1 g/dL — ABNORMAL LOW (ref 13.0–17.0)
MCH: 32.6 pg (ref 26.0–34.0)
MCHC: 32.5 g/dL (ref 30.0–36.0)
MCV: 100.3 fL — ABNORMAL HIGH (ref 80.0–100.0)
Platelets: 173 10*3/uL (ref 150–400)
RBC: 3.1 MIL/uL — ABNORMAL LOW (ref 4.22–5.81)
RDW: 15.4 % (ref 11.5–15.5)
WBC: 3.5 10*3/uL — ABNORMAL LOW (ref 4.0–10.5)
nRBC: 0.6 % — ABNORMAL HIGH (ref 0.0–0.2)

## 2022-11-28 LAB — VITAMIN B12: Vitamin B-12: 556 pg/mL (ref 180–914)

## 2022-11-28 LAB — GLUCOSE, CAPILLARY
Glucose-Capillary: 146 mg/dL — ABNORMAL HIGH (ref 70–99)
Glucose-Capillary: 236 mg/dL — ABNORMAL HIGH (ref 70–99)
Glucose-Capillary: 278 mg/dL — ABNORMAL HIGH (ref 70–99)
Glucose-Capillary: 266 mg/dL — ABNORMAL HIGH (ref 70–99)

## 2022-11-28 LAB — TSH: TSH: 2.466 u[IU]/mL (ref 0.350–4.500)

## 2022-11-28 MED ORDER — GADOBUTROL 1 MMOL/ML IV SOLN
8.0000 mL | Freq: Once | INTRAVENOUS | Status: AC | PRN
  Administered 2022-11-28: 8 mL via INTRAVENOUS

## 2022-11-28 MED ORDER — OLAPARIB 150 MG PO TABS
150.0000 mg | ORAL_TABLET | Freq: Two times a day (BID) | ORAL | Status: DC
  Administered 2022-11-28 – 2022-11-29 (×2): 150 mg via ORAL
  Filled 2022-11-28 (×3): qty 1

## 2022-11-28 MED ORDER — OLAPARIB 100 MG PO TABS
100.0000 mg | ORAL_TABLET | Freq: Two times a day (BID) | ORAL | Status: DC
  Administered 2022-11-28 – 2022-11-29 (×2): 100 mg via ORAL
  Filled 2022-11-28 (×3): qty 1

## 2022-11-28 NOTE — Plan of Care (Signed)
Problem: Education: Goal: Knowledge of General Education information will improve Description: Including pain rating scale, medication(s)/side effects and non-pharmacologic comfort measures 11/28/2022 1726 by Ellison Carwin, RN Outcome: Progressing 11/28/2022 1725 by Ellison Carwin, RN Outcome: Progressing 11/28/2022 1211 by Ellison Carwin, RN Outcome: Progressing   Problem: Health Behavior/Discharge Planning: Goal: Ability to manage health-related needs will improve 11/28/2022 1726 by Ellison Carwin, RN Outcome: Progressing 11/28/2022 1725 by Ellison Carwin, RN Outcome: Progressing 11/28/2022 1211 by Ellison Carwin, RN Outcome: Progressing   Problem: Clinical Measurements: Goal: Ability to maintain clinical measurements within normal limits will improve 11/28/2022 1726 by Ellison Carwin, RN Outcome: Progressing 11/28/2022 1725 by Ellison Carwin, RN Outcome: Progressing 11/28/2022 1211 by Ellison Carwin, RN Outcome: Progressing Goal: Will remain free from infection 11/28/2022 1726 by Ellison Carwin, RN Outcome: Progressing 11/28/2022 1725 by Ellison Carwin, RN Outcome: Progressing 11/28/2022 1211 by Ellison Carwin, RN Outcome: Progressing Goal: Diagnostic test results will improve 11/28/2022 1726 by Ellison Carwin, RN Outcome: Progressing 11/28/2022 1725 by Ellison Carwin, RN Outcome: Progressing 11/28/2022 1211 by Ellison Carwin, RN Outcome: Progressing Goal: Respiratory complications will improve 11/28/2022 1726 by Ellison Carwin, RN Outcome: Progressing 11/28/2022 1725 by Ellison Carwin, RN Outcome: Progressing 11/28/2022 1211 by Ellison Carwin, RN Outcome: Progressing Goal: Cardiovascular complication will be avoided 11/28/2022 1726 by Ellison Carwin, RN Outcome: Progressing 11/28/2022 1725 by Ellison Carwin, RN Outcome: Progressing 11/28/2022 1211 by Ellison Carwin, RN Outcome: Progressing   Problem: Activity: Goal: Risk for  activity intolerance will decrease 11/28/2022 1726 by Ellison Carwin, RN Outcome: Progressing 11/28/2022 1725 by Ellison Carwin, RN Outcome: Progressing 11/28/2022 1211 by Ellison Carwin, RN Outcome: Progressing   Problem: Nutrition: Goal: Adequate nutrition will be maintained 11/28/2022 1726 by Ellison Carwin, RN Outcome: Progressing 11/28/2022 1725 by Ellison Carwin, RN Outcome: Progressing 11/28/2022 1211 by Ellison Carwin, RN Outcome: Progressing   Problem: Coping: Goal: Level of anxiety will decrease 11/28/2022 1726 by Ellison Carwin, RN Outcome: Progressing 11/28/2022 1725 by Ellison Carwin, RN Outcome: Progressing 11/28/2022 1211 by Ellison Carwin, RN Outcome: Progressing   Problem: Elimination: Goal: Will not experience complications related to bowel motility 11/28/2022 1726 by Ellison Carwin, RN Outcome: Progressing 11/28/2022 1725 by Ellison Carwin, RN Outcome: Progressing 11/28/2022 1211 by Ellison Carwin, RN Outcome: Progressing Goal: Will not experience complications related to urinary retention 11/28/2022 1726 by Ellison Carwin, RN Outcome: Progressing 11/28/2022 1725 by Ellison Carwin, RN Outcome: Progressing 11/28/2022 1211 by Ellison Carwin, RN Outcome: Progressing   Problem: Pain Managment: Goal: General experience of comfort will improve 11/28/2022 1726 by Ellison Carwin, RN Outcome: Progressing 11/28/2022 1725 by Ellison Carwin, RN Outcome: Progressing 11/28/2022 1211 by Ellison Carwin, RN Outcome: Progressing   Problem: Safety: Goal: Ability to remain free from injury will improve 11/28/2022 1726 by Ellison Carwin, RN Outcome: Progressing 11/28/2022 1725 by Ellison Carwin, RN Outcome: Progressing 11/28/2022 1211 by Ellison Carwin, RN Outcome: Progressing   Problem: Skin Integrity: Goal: Risk for impaired skin integrity will decrease 11/28/2022 1726 by Ellison Carwin, RN Outcome: Progressing 11/28/2022 1725 by  Ellison Carwin, RN Outcome: Progressing 11/28/2022 1211 by Ellison Carwin, RN Outcome: Progressing   Problem: Education: Goal: Ability to describe self-care measures that may prevent or decrease complications (Diabetes Survival Skills Education) will improve 11/28/2022 1726 by Ellison Carwin, RN Outcome: Progressing 11/28/2022 1725 by Ellison Carwin, RN Outcome: Progressing 11/28/2022 1211 by Ellison Carwin, RN Outcome: Progressing Goal: Individualized Educational Video(s) 11/28/2022 1726 by Ellison Carwin, RN Outcome: Progressing 11/28/2022 1725 by Ellison Carwin, RN Outcome: Progressing 11/28/2022 1211 by Ellison Carwin, RN Outcome:  Progressing   Problem: Coping: Goal: Ability to adjust to condition or change in health will improve 11/28/2022 1726 by Ellison Carwin, RN Outcome: Progressing 11/28/2022 1725 by Ellison Carwin, RN Outcome: Progressing 11/28/2022 1211 by Ellison Carwin, RN Outcome: Progressing   Problem: Fluid Volume: Goal: Ability to maintain a balanced intake and output will improve 11/28/2022 1726 by Ellison Carwin, RN Outcome: Progressing 11/28/2022 1725 by Ellison Carwin, RN Outcome: Progressing 11/28/2022 1211 by Ellison Carwin, RN Outcome: Progressing   Problem: Health Behavior/Discharge Planning: Goal: Ability to identify and utilize available resources and services will improve 11/28/2022 1726 by Ellison Carwin, RN Outcome: Progressing 11/28/2022 1725 by Ellison Carwin, RN Outcome: Progressing 11/28/2022 1211 by Ellison Carwin, RN Outcome: Progressing Goal: Ability to manage health-related needs will improve 11/28/2022 1726 by Ellison Carwin, RN Outcome: Progressing 11/28/2022 1725 by Ellison Carwin, RN Outcome: Progressing 11/28/2022 1211 by Ellison Carwin, RN Outcome: Progressing   Problem: Metabolic: Goal: Ability to maintain appropriate glucose levels will improve 11/28/2022 1726 by Ellison Carwin, RN Outcome:  Progressing 11/28/2022 1725 by Ellison Carwin, RN Outcome: Progressing 11/28/2022 1211 by Ellison Carwin, RN Outcome: Progressing   Problem: Nutritional: Goal: Maintenance of adequate nutrition will improve 11/28/2022 1726 by Ellison Carwin, RN Outcome: Progressing 11/28/2022 1725 by Ellison Carwin, RN Outcome: Progressing 11/28/2022 1211 by Ellison Carwin, RN Outcome: Progressing Goal: Progress toward achieving an optimal weight will improve 11/28/2022 1726 by Ellison Carwin, RN Outcome: Progressing 11/28/2022 1725 by Ellison Carwin, RN Outcome: Progressing 11/28/2022 1211 by Ellison Carwin, RN Outcome: Progressing   Problem: Skin Integrity: Goal: Risk for impaired skin integrity will decrease 11/28/2022 1726 by Ellison Carwin, RN Outcome: Progressing 11/28/2022 1725 by Ellison Carwin, RN Outcome: Progressing 11/28/2022 1211 by Ellison Carwin, RN Outcome: Progressing   Problem: Tissue Perfusion: Goal: Adequacy of tissue perfusion will improve 11/28/2022 1726 by Ellison Carwin, RN Outcome: Progressing 11/28/2022 1725 by Ellison Carwin, RN Outcome: Progressing 11/28/2022 1211 by Ellison Carwin, RN Outcome: Progressing   Problem: Education: Goal: Knowledge of disease or condition will improve 11/28/2022 1726 by Ellison Carwin, RN Outcome: Progressing 11/28/2022 1725 by Ellison Carwin, RN Outcome: Progressing 11/28/2022 1211 by Ellison Carwin, RN Outcome: Progressing Goal: Knowledge of secondary prevention will improve (MUST DOCUMENT ALL) 11/28/2022 1726 by Ellison Carwin, RN Outcome: Progressing 11/28/2022 1725 by Ellison Carwin, RN Outcome: Progressing 11/28/2022 1211 by Ellison Carwin, RN Outcome: Progressing Goal: Knowledge of patient specific risk factors will improve Loraine Leriche N/A or DELETE if not current risk factor) 11/28/2022 1726 by Ellison Carwin, RN Outcome: Progressing 11/28/2022 1725 by Ellison Carwin, RN Outcome:  Progressing 11/28/2022 1211 by Ellison Carwin, RN Outcome: Progressing   Problem: Ischemic Stroke/TIA Tissue Perfusion: Goal: Complications of ischemic stroke/TIA will be minimized 11/28/2022 1726 by Ellison Carwin, RN Outcome: Progressing 11/28/2022 1725 by Ellison Carwin, RN Outcome: Progressing 11/28/2022 1211 by Ellison Carwin, RN Outcome: Progressing   Problem: Coping: Goal: Will verbalize positive feelings about self 11/28/2022 1726 by Ellison Carwin, RN Outcome: Progressing 11/28/2022 1725 by Ellison Carwin, RN Outcome: Progressing 11/28/2022 1211 by Ellison Carwin, RN Outcome: Progressing Goal: Will identify appropriate support needs 11/28/2022 1726 by Ellison Carwin, RN Outcome: Progressing 11/28/2022 1725 by Ellison Carwin, RN Outcome: Progressing 11/28/2022 1211 by Ellison Carwin, RN Outcome: Progressing   Problem: Health Behavior/Discharge Planning: Goal: Ability to manage health-related needs will improve 11/28/2022 1726 by Ellison Carwin, RN Outcome: Progressing 11/28/2022 1725 by Ellison Carwin, RN Outcome: Progressing 11/28/2022 1211 by Ellison Carwin, RN Outcome: Progressing Goal: Goals will be collaboratively established with patient/family 11/28/2022 1726 by  Ellison Carwin, RN Outcome: Progressing 11/28/2022 1725 by Ellison Carwin, RN Outcome: Progressing 11/28/2022 1211 by Ellison Carwin, RN Outcome: Progressing   Problem: Self-Care: Goal: Ability to participate in self-care as condition permits will improve 11/28/2022 1726 by Ellison Carwin, RN Outcome: Progressing 11/28/2022 1725 by Ellison Carwin, RN Outcome: Progressing 11/28/2022 1211 by Ellison Carwin, RN Outcome: Progressing Goal: Verbalization of feelings and concerns over difficulty with self-care will improve 11/28/2022 1726 by Ellison Carwin, RN Outcome: Progressing 11/28/2022 1725 by Ellison Carwin, RN Outcome: Progressing 11/28/2022 1211 by Ellison Carwin, RN Outcome: Progressing Goal: Ability to communicate needs accurately will improve 11/28/2022 1726 by Ellison Carwin, RN Outcome: Progressing 11/28/2022 1725 by Ellison Carwin, RN Outcome: Progressing 11/28/2022 1211 by Ellison Carwin, RN Outcome: Progressing   Problem: Nutrition: Goal: Risk of aspiration will decrease 11/28/2022 1726 by Ellison Carwin, RN Outcome: Progressing 11/28/2022 1725 by Ellison Carwin, RN Outcome: Progressing 11/28/2022 1211 by Ellison Carwin, RN Outcome: Progressing Goal: Dietary intake will improve 11/28/2022 1726 by Ellison Carwin, RN Outcome: Progressing 11/28/2022 1725 by Ellison Carwin, RN Outcome: Progressing 11/28/2022 1211 by Ellison Carwin, RN Outcome: Progressing

## 2022-11-28 NOTE — [place] FL2 (Signed)
[place] MEDICAID FL2 LEVEL OF CARE FORM     IDENTIFICATION  Patient Name: Matthew Mcgrath Birthdate: 30-Jun-1945 Sex: male Admission Date (Current Location): 11/26/2022  [facility] and [place] Number:  Chiropodist and Address:  [facility], 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   Stroke (HCC) 11/26/2022   Acute encephalopathy 11/26/2022   Lower extremity weakness 06/28/2022   Cognitive impairment 05/17/2022   Anemia    Sepsis (HCC) 06/19/2021   Gross hematuria 06/19/2021   Complicated UTI (urinary tract infection) 06/18/2021   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   Genetic testing 02/22/2021   Family history of colon cancer 01/13/2021   Family history of breast cancer 01/13/2021   Encounter for antineoplastic chemotherapy 07/06/2020   Metastatic cancer to bone (HCC) 07/06/2020   Metastasis to bone (HCC) 05/28/2020   Prostate cancer (HCC) 05/11/2020   Goals of care, counseling/discussion 05/11/2020   Iliac artery aneurysm (HCC) 04/12/2016   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   Cardiomyopathy (HCC) 04/24/2015   S/P cardiac catheterization 04/24/2015   Chest pain 12/23/2014   Diabetes (HCC) 11/21/2014   Chronic systolic heart failure (HCC) 11/20/2014   Tobacco abuse 11/20/2014   Pedal edema 11/03/2014   Acute on chronic combined systolic and diastolic congestive heart failure (HCC) 09/20/2014   Elevation of level of transaminase and lactic acid dehydrogenase (LDH)  09/20/2014   Essential (primary) hypertension 05/02/2014   Hyperlipidemia 05/02/2014   Shortness of breath 05/02/2014   Urinary tract infection 05/02/2014   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    Orientation RESPIRATION BLADDER Height & Weight     Self, Situation, Place  Normal Incontinent, External catheter Weight:   Height:     BEHAVIORAL SYMPTOMS/MOOD NEUROLOGICAL BOWEL NUTRITION STATUS      Incontinent Diet (See DC Summary)  AMBULATORY STATUS COMMUNICATION OF NEEDS Skin   Extensive Assist Verbally Normal                       Personal Care Assistance Level of Assistance  Bathing, Feeding, Dressing Bathing Assistance: Maximum assistance Feeding assistance: Independent Dressing Assistance: Limited assistance     Functional Limitations Info             SPECIAL CARE FACTORS FREQUENCY  PT (By licensed PT), OT (By licensed OT)     PT Frequency: 5x/week OT Frequency: 5x/week            Contractures Contractures Info: Not present    Additional Factors Info  Code Status, Allergies, Insulin Sliding Scale Code Status Info: Full Allergies Info: NKA   Insulin Sliding Scale Info: See dc summary       Current Medications (11/28/2022):  This is the current hospital active medication list Current Facility-Administered Medications  Medication Dose Route Frequency Provider Last Rate Last Admin   0.9 %  sodium chloride infusion  250 mL Intravenous PRN Opyd, Lavone Neri, MD       acetaminophen (TYLENOL) tablet 650  mg  650 mg Oral Q4H PRN Opyd, Lavone Neri, MD       Or   acetaminophen (TYLENOL) 160 MG/5ML solution 650 mg  650 mg Per Tube Q4H PRN Opyd, Lavone Neri, MD       Or   acetaminophen (TYLENOL) suppository 650 mg  650 mg Rectal Q4H PRN Opyd, Lavone Neri, MD       aspirin EC tablet 81 mg  81 mg Oral Daily Opyd, Lavone Neri, MD   81 mg at 11/28/22 0813   carvedilol (COREG) tablet 3.125 mg  3.125 mg Oral BID WC Opyd, Lavone Neri, MD   3.125 mg at  11/28/22 0812   enoxaparin (LOVENOX) injection 40 mg  40 mg Subcutaneous Q24H Opyd, Lavone Neri, MD   40 mg at 11/27/22 2149   insulin aspart (novoLOG) injection 0-5 Units  0-5 Units Subcutaneous QHS Briscoe Deutscher, MD   2 Units at 11/27/22 2148   insulin aspart (novoLOG) injection 0-6 Units  0-6 Units Subcutaneous TID WC Opyd, Lavone Neri, MD   2 Units at 11/28/22 1233   olaparib (LYNPARZA) tablet 150 mg  150 mg Oral BID Elgergawy, Leana Roe, MD       And   olaparib [facility]) tablet 100 mg  100 mg Oral BID Elgergawy, Leana Roe, MD       pantoprazole (PROTONIX) EC tablet 40 mg  40 mg Oral Daily Opyd, Lavone Neri, MD   40 mg at 11/28/22 8841   senna (SENOKOT) tablet 17.2 mg  2 tablet Oral Daily Opyd, Lavone Neri, MD   17.2 mg at 11/27/22 0915   sodium chloride flush (NS) 0.9 % injection 3 mL  3 mL Intravenous Q12H Opyd, Lavone Neri, MD   3 mL at 11/28/22 0814   sodium chloride flush (NS) 0.9 % injection 3 mL  3 mL Intravenous PRN Opyd, Lavone Neri, MD       spironolactone (ALDACTONE) tablet 12.5 mg  12.5 mg Oral Daily Opyd, Lavone Neri, MD   12.5 mg at 11/28/22 6606   tamsulosin (FLOMAX) capsule 0.4 mg  0.4 mg Oral Daily Opyd, Lavone Neri, MD   0.4 mg at 11/28/22 3016     Discharge Medications: Please see discharge summary for a list of discharge medications.  Relevant Imaging Results:  Relevant Lab Results:   Additional Information SSn: [id number] [place], [place]

## 2022-11-28 NOTE — TOC Initial Note (Addendum)
Transition of Care [facility]) - Initial/Assessment Note    Patient Details  Name: Matthew Mcgrath MRN: [id number] Date of Birth: 11-06-1945  [facility]) CM/SW Contact:    Mearl Latin, LCSW Phone Number: 11/28/2022, 9:34 AM  Clinical Narrative:                 9:34am-CSW contacted [facility] and spoke with Jon Gills [phone]). CSW provided updated and SNF recommendation and she stated she would consult with her team and let CSW know. Patient does receive [facility] in the home for a few hours per day and was able to ambulate.  2:38 PM-CSW received call from Dr. Mohammed Kindle [phone]) from [facility] requesting to speak with CSW. CSW returned call and left voicemail.   3:11 PM-CSW spoke with Dr. Arta Silence and provided update. She stated she will approve SNF for patient. [facility] with [facility], [facility], [employer]. She will ask their transport team to see if they would be able to transport patient tomorrow so that she can see him prior to SNF.   CSW updated patient's sister. She reported preference for [facility] as first choice. CSW requested Tammy with [facility] referral.   3:44 PM-[facility] is able to accept patient tomorrow. CSW updated patient's sister at bedside.    Barriers to Discharge: Continued Medical Work up   Patient Goals and CMS Choice            Expected Discharge Plan and Services In-house Referral: Clinical Social Work     Living arrangements for the past 2 months: Single Family Home                                      Prior Living Arrangements/Services Living arrangements for the past 2 months: Single Family Home Lives with:: Self Patient language and need for interpreter reviewed:: Yes        Need for Family Participation in Patient Care: Yes (Comment) Care giver support system in place?: Yes (comment) Current home services: DME, Homehealth aide ([facility]) Criminal Activity/Legal Involvement  Pertinent to Current Situation/Hospitalization: No - Comment as needed  Activities of Daily Living Home Assistive Devices/Equipment: Walker (specify type) ADL Screening (condition at time of admission) Patient's cognitive ability adequate to safely complete daily activities?: Yes Is the patient deaf or have difficulty hearing?: No Does the patient have difficulty seeing, even when wearing glasses/contacts?: No Does the patient have difficulty concentrating, remembering, or making decisions?: Yes Patient able to express need for assistance with ADLs?: Yes Does the patient have difficulty dressing or bathing?: No Independently performs ADLs?: Yes (appropriate for developmental age) Does the patient have difficulty walking or climbing stairs?: Yes Weakness of Legs: Both Weakness of Arms/Hands: None  Permission Sought/Granted      Share Information with NAME: [name]  Permission granted to share info w AGENCY: [facility]  Permission granted to share info w Relationship: Sister  Permission granted to share info w Contact Information: [phone]  Emotional Assessment Appearance:: Appears stated age Attitude/Demeanor/Rapport: Unable to Assess Affect (typically observed): Unable to Assess Orientation: : Oriented to Self, Oriented to Place, Oriented to Situation Alcohol / Substance Use: Not Applicable Psych Involvement: No (comment)  Admission diagnosis:  Acute encephalopathy [G93.40] Patient Active Problem List   Diagnosis Date Noted   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   Stroke (HCC) 11/26/2022   Acute encephalopathy 11/26/2022  Lower extremity weakness 06/28/2022   Cognitive impairment 05/17/2022   Anemia    Sepsis (HCC) 06/19/2021   Gross hematuria 06/19/2021   Complicated UTI (urinary tract infection) 06/18/2021   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   Genetic testing 02/22/2021   Family history of colon cancer 01/13/2021   Family history of breast cancer 01/13/2021    Encounter for antineoplastic chemotherapy 07/06/2020   Metastatic cancer to bone (HCC) 07/06/2020   Metastasis to bone (HCC) 05/28/2020   Prostate cancer (HCC) 05/11/2020   Goals of care, counseling/discussion 05/11/2020   Iliac artery aneurysm (HCC) 04/12/2016   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   Cardiomyopathy (HCC) 04/24/2015   S/P cardiac catheterization 04/24/2015   Chest pain 12/23/2014   Diabetes (HCC) 11/21/2014   Chronic systolic heart failure (HCC) 11/20/2014   Tobacco abuse 11/20/2014   Pedal edema 11/03/2014   Acute on chronic combined systolic and diastolic congestive heart failure (HCC) 09/20/2014   Elevation of level of transaminase and lactic acid dehydrogenase (LDH) 09/20/2014   Essential (primary) hypertension 05/02/2014   Hyperlipidemia 05/02/2014   Shortness of breath 05/02/2014   Urinary tract infection 05/02/2014   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   PCP:  [facility] Pharmacy:   [facility] - [place], [place] - [address] [place] [address] Phone: [phone] Fax: [phone]     Social Determinants of Health (SDOH) Social History: SDOH Screenings   Food Insecurity: No Food Insecurity (11/26/2022)  Housing: Low Risk  (11/26/2022)  Transportation Needs: No Transportation Needs (11/26/2022)  Utilities: Not At Risk (11/26/2022)  Tobacco Use: Medium Risk (11/26/2022)   SDOH Interventions:     Readmission Risk Interventions    06/21/2021   11:22 AM  Readmission Risk Prevention Plan  Transportation Screening Complete  PCP or Specialist Appt within 5-7 Days Complete  Home Care Screening Complete  Medication Review (RN CM) Complete

## 2022-11-28 NOTE — Progress Notes (Addendum)
STROKE TEAM PROGRESS NOTE   BRIEF HPI [name] is a 77 y.o. male with history of diabetes, hypertension, HFrEF (40 - 50%) metastatic prostate cancer to the bone, presented to the emergency department from [facility] after being found down after the alarm company called EMS for a fall. Fall occurred 9/20 in the afternoon or evening. Patient was found on the floor. He remembers the fall but does not remember if he lost consciousness or not.   SIGNIFICANT HOSPITAL EVENTS 9/21: Admission to the hospital.  CT: Cortical left frontal lobe infarct, new from April.  07/01/2022.  CTA head and neck: Calcified plaque in intracranial ICA's, severe stenosis on the left.  Right> left carotid calcifications at bifurcations.  A1c: 7.1, LDL: 66. 9/23: MRI negative for acute process.  Extensive microvascular ischemic change.  INTERIM HISTORY/SUBJECTIVE  MRI earlier in day showed no evidence of stroke.  On interview with sister in room, patient is sitting up in a chair, no acute distress.  He is oriented to self, sister and place ("hospital"), but not to year.  Per sister patient has not returned to his cognitive baseline, describes him as slow.  Would like PT and OT to continue to see patient.  Has concerns about him returning to living alone, despite having a caregiver 2 hours/day and attending [facility] 3 days a week.  Will initiate cognitive workup/reversible etiologies of cognitive decline.  OBJECTIVE  CBC    Component Value Date/Time   WBC 3.5 (L) 11/28/2022 0518   RBC 3.10 (L) 11/28/2022 0518   HGB 10.1 (L) 11/28/2022 0518   HGB 10.2 (L) 10/25/2022 1029   HGB 13.2 03/14/2014 1557   HCT 31.1 (L) 11/28/2022 0518   HCT 41.8 03/14/2014 1557   PLT 173 11/28/2022 0518   PLT 192 10/25/2022 1029   PLT 219 03/14/2014 1557   MCV 100.3 (H) 11/28/2022 0518   MCV 95 03/14/2014 1557   MCH 32.6 11/28/2022 0518   MCHC 32.5 11/28/2022 0518   RDW 15.4 11/28/2022 0518   RDW 14.8 (H) 03/14/2014 1557    LYMPHSABS 0.4 (L) 11/26/2022 0932   LYMPHSABS 1.8 03/14/2014 1557   MONOABS 0.3 11/26/2022 0932   MONOABS 0.6 03/14/2014 1557   EOSABS 0.0 11/26/2022 0932   EOSABS 0.1 03/14/2014 1557   BASOSABS 0.0 11/26/2022 0932   BASOSABS 0.1 03/14/2014 1557    BMET    Component Value Date/Time   NA 135 11/28/2022 0518   NA 141 03/14/2014 1557   K 3.5 11/28/2022 0518   K 3.9 03/14/2014 1557   CL 105 11/28/2022 0518   CL 108 (H) 03/14/2014 1557   CO2 22 11/28/2022 0518   CO2 27 03/14/2014 1557   GLUCOSE 163 (H) 11/28/2022 0518   GLUCOSE 232 (H) 03/14/2014 1557   BUN 13 11/28/2022 0518   BUN 10 03/14/2014 1557   CREATININE 0.79 11/28/2022 0518   CREATININE 0.82 10/25/2022 1028   CREATININE 0.76 03/14/2014 1557   CALCIUM 8.7 (L) 11/28/2022 0518   CALCIUM 8.4 (L) 03/14/2014 1557   GFRNONAA >60 11/28/2022 0518   GFRNONAA >60 10/25/2022 1028   GFRNONAA >60 03/14/2014 1557    IMAGING past 24 hours MR BRAIN W WO CONTRAST  Result Date: 11/28/2022 CLINICAL DATA:  Stroke, follow up Metastatic disease evaluation EXAM: MRI HEAD WITHOUT AND WITH CONTRAST TECHNIQUE: Multiplanar, multiecho pulse sequences of the brain and surrounding structures were obtained without and with intravenous contrast. CONTRAST:  8mL GADAVIST GADOBUTROL 1 MMOL/ML IV SOLN COMPARISON:  CT head 11/26/2022. FINDINGS: Brain: No acute infarction, hemorrhage, hydrocephalus, extra-axial collection or mass lesion. Advanced confluent and patchy T2/FLAIR hyperintensities in the white matter, nonspecific but compatible with chronic microvascular ischemic disease. 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 No pathologic enhancement. Vascular: Major arterial flow voids are maintained at the skull base. Skull and upper cervical spine: Normal marrow signal. Sinuses/Orbits: Clear sinuses.  No acute orbital findings. Other: No meso his free IMPRESSION: 1. No evidence of acute intracranial abnormality or metastatic disease. 2. Advanced chronic  microvascular ischemic change. Electronically Signed   By: Feliberto Harts M.D.   On: 11/28/2022 13:32    Vitals:   11/28/22 0741 11/28/22 0800 11/28/22 0812 11/28/22 1217  BP: (!) 136/91 (!) 139/91 (!) 139/91 (!) 136/91  Pulse: 85 85 87 85  Resp: 18   (!) 23  Temp: 98.4 F (36.9 C)   98.4 F (36.9 C)  TempSrc:      SpO2: 95% 94%  92%    PHYSICAL EXAM General: No acute distress, sitting up in chair Psych:  Mood and affect appropriate for situation CV: Regular rate and rhythm on monitor Respiratory:  Regular, unlabored respirations on room air GI: Abdomen soft and nontender   NEURO:  Mental Status: Alert and oriented to self, and place but not date year or month. Speech/Language: dysarthric, very slow duration and onset of speech, some stuttering.  Naming, repetition, and fluency intact.  Cranial Nerves:  II: Visual fields full.  III, IV, VI: EOMI. Eyelids elevate symmetrically.  V: Sensation is intact to light touch and symmetrical to face.  VII: Face is symmetrical resting and smiling VIII: hearing intact to voice. IX, X: Palate elevates symmetrically. Phonation is normal.  ZO:[id number] shrug 5/5. XII: tongue is midline without fasciculations. Motor: Range of motion intact, slow Tone: is normal and bulk is normal Sensation- Intact to light touch bilaterally. 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 Stroke unlikely.  AMS likely secondary to fall.  Patient still has trouble explaining what happened.  Patient cognitive baseline is severely impaired -- and perhaps worsened since admission.  Family and room have concerns that he will not be able to return to living alone.  Social work is on Theatre manager.  Per OT note, patient was unaware of being incontinent of loose watery stool which, per  sister, is off of his baseline. Ordered labs to investigate reversible causes of cognitive decline (B12, RPR, TSH) as well as MOCA.   Code Stroke CT head: Small recent appearing cortical infarct and brain MRI 07/01/2022. CTA head & neck: Calcified plaque in the intracranial ICA's resulting in severe stenosis of the cavernous segment on the left and no greater than mild stenosis on the right.  Otherwise, patient intracranial vasculature without other significant stenosis or occlusion.  Calcified plaque at the carotid bifurcations, right worse than left, without significant stenosis or occlusion of the internal carotid arteries.  Sclerotic lesion in the C3 vertebral body, T5 vertebral body, and right third rib likely reflect metastatic disease given the history of metastatic prostate cancer. EEG- This study is suggestive of mild diffuse encephalopathy. No seizures or epileptiform discharges were seen throughout the recording  MRI: No evidence of acute intracranial abnormality or metastatic disease.  Advanced chronic microvascular ischemic change. 2D Echo with enhancement LVEF: 40 to 45%.  Left atrium size within  normal limits. LDL: 66 HgbA1c 7.1 VTE prophylaxis -subcu Lovenox 40 Aspirin 81 mg daily at home, continuing aspirin 81 mg.   Therapy recommendations: PT: Post acute inpatient setting.  OT: Continued inpatient Disposition: Pending further workup  Hypertension Home meds: Coreg 3.25 twice daily Stable  Diabetes type II Uncontrolled HgbA1c 7.1, goal < 7.0 CBGs Very sensitive SSI Recommend close follow-up with PCP for better DM control  CHF (45-50%) Agree with continue Coreg and Aldactone Consider repeat TTE, last 10/22  Other Stroke Risk Factors Advanced age Hypertension Coronary artery disease Tobacco use history  Hospital day # 0 I have personally obtained history,examined this patient, reviewed notes, independently viewed imaging studies, participated in medical decision  making and plan of care.ROS completed by me personally and pertinent positives fully documented  I have made any additions or clarifications directly to the above note. Agree with note above.  Patient was admitted following an unexplained fall at home with some altered mental status which appears to be improving.  Suspect lives alone and has some mild cognitive impairment at baseline.  Neuroimaging negative for acute stroke and EEG shows no definite seizure activity.  Recommend check labs for reversible causes of dementia.  Mobilize out of bed.  Speech therapy for cognitive evaluation.  Case manager to evaluate for safety of returning home versus short-term placement in rehab.  Long discussion at bedside with the patient and sister and answered questions.  Discussed with Dr. Randol Kern.  Greater than 50% time during this 50-minute visit was spent in counseling and coordination of care and discussion with patient and family and care team and answering questions  Delia Heady, MD Medical Director [facility] Pager: [phone] 11/28/2022 3:35 PM   To contact Stroke Continuity provider, please refer to [email or web address]. After hours, contact General Neurology

## 2022-11-28 NOTE — Progress Notes (Signed)
Physical Therapy Treatment Patient Details Name: Matthew Mcgrath MRN: [id number] DOB: 03-19-1945 Today's Date: 11/28/2022   History of Present Illness 77 yo admitted from home with confusion, slurred speech and facial droop; found down on floor s/p fall. Pt with acute encephalopathy;L frontal lobe CVA. 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    PT Comments  Patient up in chair on arrival. Educated in correct use of RW for transfers with 8 reps for strengthening and learning. Ambulated short distance with RW with min assist (8 ft total) and assisted back to bed. RLE remains weaker than LLE and requires constant cues for advancing it.     If plan is discharge home, recommend the following: A lot of help with walking and/or transfers;A lot of help with bathing/dressing/bathroom;Assistance with cooking/housework;Direct supervision/assist for medications management;Help with stairs or ramp for entrance;Assist for transportation   Can travel by private vehicle     No  Equipment Recommendations  Rolling walker (2 wheels)    Recommendations for Other Services       Precautions / Restrictions Precautions Precautions: Fall Restrictions Weight Bearing Restrictions: No     Mobility  Bed Mobility Overal bed mobility: Needs Assistance Bed Mobility: Sit to Supine       Sit to supine: Mod assist   General bed mobility comments: assist to raise legs onto bed and control trunk    Transfers Overall transfer level: Needs assistance Equipment used: Rolling walker (2 wheels) Transfers: Sit to/from Stand, Bed to chair/wheelchair/BSC Sit to Stand: Contact guard assist   Step pivot transfers: Min assist       General transfer comment: From chair x 8 reps; assist with maneuvering RW and cues for Rt step length with pivotal steps to bed    Ambulation/Gait Ambulation/Gait assistance: Min assist Gait Distance (Feet): 8 Feet (2 forward, 2 backward x 2  reps) Assistive device: Rolling walker (2 wheels) Gait Pattern/deviations: Step-to pattern, Decreased step length - right, Decreased dorsiflexion - right, Decreased weight shift to left, Trunk flexed   Gait velocity interpretation: <1.31 ft/sec, indicative of household ambulator   General Gait Details: cuing for larger steps with RLE; assist to wt-shift to left for incr time to advance RLE   Stairs             Wheelchair Mobility     Tilt Bed    Modified Rankin (Stroke Patients Only) Modified Rankin (Stroke Patients Only) Pre-Morbid Rankin Score: Moderate disability Modified Rankin: Moderately severe disability     Balance Overall balance assessment: Needs assistance, History of Falls Sitting-balance support: No upper extremity supported, Feet supported Sitting balance-Leahy Scale: Fair     Standing balance support: Bilateral upper extremity supported, During functional activity, Reliant on assistive device for balance Standing balance-Leahy Scale: Poor                              Cognition Arousal: Alert Behavior During Therapy: Flat affect Overall Cognitive Status: Impaired/Different from baseline Area of Impairment: Attention, Memory, Following commands, Safety/judgement, Awareness, Problem solving                   Current Attention Level: Sustained Memory: Decreased short-term memory Following Commands: Follows one step commands with increased time Safety/Judgement: Decreased awareness of safety, Decreased awareness of deficits Awareness: Intellectual Problem Solving: Slow processing, Decreased initiation          Exercises  General Comments        Pertinent Vitals/Pain Pain Assessment Pain Assessment: No/denies pain    Home Living                          Prior Function            PT Goals (current goals can now be found in the care plan section) Acute Rehab PT Goals Patient Stated Goal: none  stated Time For Goal Achievement: 12/11/22 Potential to Achieve Goals: Good Progress towards PT goals: Progressing toward goals    Frequency    Min 1X/week      PT Plan      Co-evaluation              AM-PAC PT "6 Clicks" Mobility   Outcome Measure  Help needed turning from your back to your side while in a flat bed without using bedrails?: A Lot Help needed moving from lying on your back to sitting on the side of a flat bed without using bedrails?: A Lot Help needed moving to and from a bed to a chair (including a wheelchair)?: A Little Help needed standing up from a chair using your arms (e.g., wheelchair or bedside chair)?: A Little Help needed to walk in hospital room?: A Lot (<20 ft) Help needed climbing 3-5 steps with a railing? : Total 6 Click Score: 13    End of Session Equipment Utilized During Treatment: Gait belt Activity Tolerance: Patient tolerated treatment well Patient left: in bed;with call bell/phone within reach;with bed alarm set Nurse Communication: Mobility status PT Visit Diagnosis: Unsteadiness on feet (R26.81);Other abnormalities of gait and mobility (R26.89);Muscle weakness (generalized) (M62.81);History of falling (Z91.81);Difficulty in walking, not elsewhere classified (R26.2)     Time: 5621-3086 PT Time Calculation (min) (ACUTE ONLY): 19 min  Charges:    $Gait Training: 8-22 mins PT General Charges $$ ACUTE PT VISIT: 1 Visit                      Jerolyn Center, PT Acute Rehabilitation Services  Office [phone]    Zena Amos 11/28/2022, 4:37 PM

## 2022-11-28 NOTE — Plan of Care (Signed)
Problem: Education: Goal: Knowledge of General Education information will improve Description: Including pain rating scale, medication(s)/side effects and non-pharmacologic comfort measures Outcome: Progressing   Problem: Health Behavior/Discharge Planning: Goal: Ability to manage health-related needs will improve Outcome: Progressing   Problem: Clinical Measurements: Goal: Ability to maintain clinical measurements within normal limits will improve Outcome: Progressing Goal: Will remain free from infection Outcome: Progressing Goal: Diagnostic test results will improve Outcome: Progressing Goal: Respiratory complications will improve Outcome: Progressing Goal: Cardiovascular complication will be avoided Outcome: Progressing   Problem: Activity: Goal: Risk for activity intolerance will decrease Outcome: Progressing   Problem: Nutrition: Goal: Adequate nutrition will be maintained Outcome: Progressing   Problem: Coping: Goal: Level of anxiety will decrease Outcome: Progressing   Problem: Elimination: Goal: Will not experience complications related to bowel motility Outcome: Progressing Goal: Will not experience complications related to urinary retention Outcome: Progressing   Problem: Pain Managment: Goal: General experience of comfort will improve Outcome: Progressing   Problem: Safety: Goal: Ability to remain free from injury will improve Outcome: Progressing   Problem: Skin Integrity: Goal: Risk for impaired skin integrity will decrease Outcome: Progressing   Problem: Education: Goal: Ability to describe self-care measures that may prevent or decrease complications (Diabetes Survival Skills Education) will improve Outcome: Progressing Goal: Individualized Educational Video(s) Outcome: Progressing   Problem: Coping: Goal: Ability to adjust to condition or change in health will improve Outcome: Progressing   Problem: Fluid Volume: Goal: Ability to  maintain a balanced intake and output will improve Outcome: Progressing   Problem: Health Behavior/Discharge Planning: Goal: Ability to identify and utilize available resources and services will improve Outcome: Progressing Goal: Ability to manage health-related needs will improve Outcome: Progressing   Problem: Metabolic: Goal: Ability to maintain appropriate glucose levels will improve Outcome: Progressing   Problem: Nutritional: Goal: Maintenance of adequate nutrition will improve Outcome: Progressing Goal: Progress toward achieving an optimal weight will improve Outcome: Progressing   Problem: Skin Integrity: Goal: Risk for impaired skin integrity will decrease Outcome: Progressing   Problem: Tissue Perfusion: Goal: Adequacy of tissue perfusion will improve Outcome: Progressing   Problem: Education: Goal: Knowledge of disease or condition will improve Outcome: Progressing Goal: Knowledge of secondary prevention will improve (MUST DOCUMENT ALL) Outcome: Progressing Goal: Knowledge of patient specific risk factors will improve Loraine Leriche N/A or DELETE if not current risk factor) Outcome: Progressing   Problem: Ischemic Stroke/TIA Tissue Perfusion: Goal: Complications of ischemic stroke/TIA will be minimized Outcome: Progressing   Problem: Coping: Goal: Will verbalize positive feelings about self Outcome: Progressing Goal: Will identify appropriate support needs Outcome: Progressing   Problem: Health Behavior/Discharge Planning: Goal: Ability to manage health-related needs will improve Outcome: Progressing Goal: Goals will be collaboratively established with patient/family Outcome: Progressing   Problem: Self-Care: Goal: Ability to participate in self-care as condition permits will improve Outcome: Progressing Goal: Verbalization of feelings and concerns over difficulty with self-care will improve Outcome: Progressing Goal: Ability to communicate needs accurately  will improve Outcome: Progressing   Problem: Nutrition: Goal: Risk of aspiration will decrease Outcome: Progressing Goal: Dietary intake will improve Outcome: Progressing

## 2022-11-28 NOTE — Progress Notes (Signed)
PROGRESS NOTE    Matthew Mcgrath  WUJ:[id number] DOB: April 08, 1945 DOA: 11/26/2022 PCP: [facility]    No chief complaint on file.   Brief Narrative:  Matthew Mcgrath is a 77 y.o. male with medical history significant for type 2 diabetes mellitus, hypertension, cardiomyopathy with ICD, prostate cancer metastatic to bone, and chronic right lower extremity weakness who presented to the ED after a fall and AMS, Patient lives alone, uses a wheelchair or walker at baseline, and was found on the floor with confusion, slurred speech, and facial droop noted by family.  Upon arrival to the ED, patient is found to be afebrile and saturating well on room air with normal heart rate and stable blood pressure.  EKG demonstrates sinus rhythm.  Chest x-ray is negative for acute cardiopulmonary disease.  Head CT is notable for questionable left frontal cortex infarction that is new from the MRI in April 2024.   Patient was evaluated by neurology and medical admission to [facility] was recommended as MRI cannot be performed at [facility] due to the patient's ICD.  She was transferred to [facility], MRI brain was done 9/23, with no evidence of acute ischemic event.   Assessment & Plan:   Principal Problem:   Acute encephalopathy Active Problems:   Prostate cancer (HCC)   Cardiomyopathy (HCC)   Insulin dependent type 2 diabetes mellitus (HCC)   Stroke (HCC)   Acute encephalopathy Falls  Acute CVA ruled out. 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 -MRI of the brain with and without contrast with no evidence of brain metastasis, or acute CVA. -Continue with aspirin, await further recommendation from  neurology . -A1C7.1, LDL acceptable at 66-follow-up on 2D echo   -EEG significant for encepalopathy, but no seizure activities -Patient remains confused, not back to his baseline as I have discussed with his sister, will pursue further workup for encephalopathy including B12, TSH, RPR. -Family weak, deconditioned with unsteady gait, will need SNF placement.   2. Prostate cancer  - Castration resistant, metastatic to bone, s/p palliative RT  - Currently on olaparib under the care of Dr. Cathie Hoops    3. Type II DM  -A1c 7.1 this admission - Check CBGs and use low-intensity SSI for now     4. Chronic HFmrEF  - EF 45-50% on TTE from October 2022  - Appears compensated  - Continue Coreg and Aldactone, monitor volume status  5.Goals of care -I have discussed with sister at bedside, overall patient continue to decline, metastatic prostate cancer, discussed with her to have palliative medicine involved to see patient wishes regarding CODE STATUS, and goals of care discussion, she is agreeable.      DVT prophylaxis: Lovenox Code Status: Full Family Communication: Discussed with sister at bedside Disposition:    Consultants:  Neurology [facility]   Subjective:  No significant events overnight as discussed with staff, patient denies any complaints today  Objective: Vitals:   11/28/22 0741 11/28/22 0800 11/28/22 0812 11/28/22 1217  BP: (!) 136/91 (!) 139/91 (!) 139/91 (!) 136/91  Pulse: 85 85 87 85  Resp: 18   (!) 23  Temp: 98.4 F (36.9 C)   98.4 F (36.9 C)  TempSrc:      SpO2: 95% 94%  92%    Intake/Output Summary (Last 24 hours) at 11/28/2022 1528 Last data filed at 11/28/2022 0400 Gross per 24 hour  Intake --  Output 1375 ml  Net -1375 ml   There were no vitals filed for this visit.  Examination:  Awake Alert, Oriented, conversant, but slowed to speak, with mild dysarthria Symmetrical Chest wall movement, Good air movement bilaterally, CTAB RRR,No Gallops,Rubs or  new Murmurs, No Parasternal Heave +ve B.Sounds, Abd Soft, No tenderness, No rebound - guarding or rigidity. No Cyanosis, Clubbing or edema, No new Rash or bruise      Data Reviewed: I have personally reviewed following labs and imaging studies  CBC: Recent Labs  Lab 11/26/22 0932 11/27/22 0733 11/28/22 0518  WBC 4.9 3.5* 3.5*  NEUTROABS 4.1  --   --   HGB 11.8* 9.2* 10.1*  HCT 36.4* 28.7* 31.1*  MCV 100.8* 99.7 100.3*  PLT 184 165 173    Basic Metabolic Panel: Recent Labs  Lab 11/26/22 0932 11/27/22 0733 11/28/22 0518  NA 134* 137 135  K 5.1 3.4* 3.5  CL 102 107 105  CO2 22 21* 22  GLUCOSE 141* 165* 163*  BUN 19 11 13   CREATININE 0.77 0.70 0.79  CALCIUM 9.4 8.6* 8.7*    GFR: Estimated Creatinine Clearance: 87.6 mL/min (by C-G formula based on SCr of 0.79 mg/dL).  Liver Function Tests: Recent Labs  Lab 11/26/22 0932  AST 38  ALT 12  ALKPHOS 63  BILITOT 1.6*  PROT 8.1  ALBUMIN 4.0    CBG: Recent Labs  Lab 11/27/22 0739 11/27/22 1138 11/27/22 2127 11/28/22 0746 11/28/22 1212  GLUCAP 162* 214* 216* 146* 236*     Recent Results (from the past 240 hour(s))  Blood Culture (routine x 2)     Status: None (Preliminary result)   Collection Time: 11/26/22  9:32 AM   Specimen: BLOOD  Result Value Ref Range Status   Specimen Description   Final    BLOOD BLOOD LEFT ARM Performed at [facility], [address], [place], [place] [address]    Special Requests   Final    BOTTLES DRAWN AEROBIC AND ANAEROBIC Blood Culture adequate volume Performed at [facility], [address] [place], [place] [address]    Culture  Setup Time   Final    GRAM POSITIVE COCCI AEROBIC BOTTLE ONLY CRITICAL RESULT CALLED TO, READ BACK BY AND VERIFIED WITH: Matthew Messing RN ED @ [phone] 11/27/22 LFD Performed at [facility], [address] [place], [place] [address]    Culture GRAM POSITIVE COCCI  Final   Report Status PENDING  Incomplete  SARS  Coronavirus 2 by RT PCR (hospital order, performed in [facility] hospital lab) *cepheid single result test* Anterior Nasal Swab     Status: None   Collection Time: 11/26/22  9:32 AM   Specimen: Anterior Nasal Swab  Result Value Ref Range Status   SARS Coronavirus 2 by RT PCR NEGATIVE NEGATIVE Final    Comment: (NOTE) SARS-CoV-2 target nucleic acids are NOT DETECTED.  The SARS-CoV-2 RNA is generally detectable in upper and lower respiratory specimens during the acute phase of infection. The lowest concentration of SARS-CoV-2 viral copies this assay can detect is 250 copies / mL. A negative result does not preclude SARS-CoV-2 infection and should not be used as the sole basis for treatment or other patient management decisions.  A negative result may occur  with improper specimen collection / handling, submission of specimen other than nasopharyngeal swab, presence of viral mutation(s) within the areas targeted by this assay, and inadequate number of viral copies (<250 copies / mL). A negative result must be combined with clinical observations, patient history, and epidemiological information.  Fact Sheet for Patients:   RoadLapTop.co.za  Fact Sheet for Healthcare Providers: http://kim-miller.com/  This test is not yet approved or  cleared by the [place] FDA and has been authorized for detection and/or diagnosis of SARS-CoV-2 by FDA under an Emergency Use Authorization (EUA).  This EUA will remain in effect (meaning this test can be used) for the duration of the COVID-19 declaration under Section 564(b)(1) of the Act, 21 U.S.C. section 360bbb-3(b)(1), unless the authorization is terminated or revoked sooner.  Performed at [facility], [address] [place], [place] [address]   Blood Culture ID Panel (Reflexed)     Status: None   Collection Time: 11/26/22  9:32 AM  Result Value Ref Range Status   Enterococcus faecalis  NOT DETECTED NOT DETECTED Final   Enterococcus Faecium NOT DETECTED NOT DETECTED Final   Listeria monocytogenes NOT DETECTED NOT DETECTED Final   Staphylococcus species NOT DETECTED NOT DETECTED Final   Staphylococcus aureus (BCID) NOT DETECTED NOT DETECTED Final   Staphylococcus epidermidis NOT DETECTED NOT DETECTED Final   Staphylococcus lugdunensis NOT DETECTED NOT DETECTED Final   Streptococcus species NOT DETECTED NOT DETECTED Final   Streptococcus agalactiae NOT DETECTED NOT DETECTED Final   Streptococcus pneumoniae NOT DETECTED NOT DETECTED Final   Streptococcus pyogenes NOT DETECTED NOT DETECTED Final   A.calcoaceticus-baumannii NOT DETECTED NOT DETECTED Final   Bacteroides fragilis NOT DETECTED NOT DETECTED Final   Enterobacterales NOT DETECTED NOT DETECTED Final   Enterobacter cloacae complex NOT DETECTED NOT DETECTED Final   Escherichia coli NOT DETECTED NOT DETECTED Final   Klebsiella aerogenes NOT DETECTED NOT DETECTED Final   Klebsiella oxytoca NOT DETECTED NOT DETECTED Final   Klebsiella pneumoniae NOT DETECTED NOT DETECTED Final   Proteus species NOT DETECTED NOT DETECTED Final   Salmonella species NOT DETECTED NOT DETECTED Final   Serratia marcescens NOT DETECTED NOT DETECTED Final   Haemophilus influenzae NOT DETECTED NOT DETECTED Final   Neisseria meningitidis NOT DETECTED NOT DETECTED Final   Pseudomonas aeruginosa NOT DETECTED NOT DETECTED Final   Stenotrophomonas maltophilia NOT DETECTED NOT DETECTED Final   Candida albicans NOT DETECTED NOT DETECTED Final   Candida auris NOT DETECTED NOT DETECTED Final   Candida glabrata NOT DETECTED NOT DETECTED Final   Candida krusei NOT DETECTED NOT DETECTED Final   Candida parapsilosis NOT DETECTED NOT DETECTED Final   Candida tropicalis NOT DETECTED NOT DETECTED Final   Cryptococcus neoformans/gattii NOT DETECTED NOT DETECTED Final    Comment: Performed at [facility], [address], [place], [place] [address]          Radiology Studies: MR BRAIN W WO CONTRAST  Result Date: 11/28/2022 CLINICAL DATA:  Stroke, follow up Metastatic disease evaluation EXAM: MRI HEAD WITHOUT AND WITH CONTRAST TECHNIQUE: Multiplanar, multiecho pulse sequences of the brain and surrounding structures were obtained without and with intravenous contrast. CONTRAST:  8mL GADAVIST GADOBUTROL 1 MMOL/ML IV SOLN COMPARISON:  CT head 11/26/2022. FINDINGS: Brain: No acute infarction, hemorrhage, hydrocephalus, extra-axial collection or mass lesion. Advanced confluent and patchy T2/FLAIR hyperintensities in the white matter, nonspecific but compatible with chronic microvascular ischemic disease. 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 No pathologic enhancement. Vascular: Major arterial flow voids  are maintained at the skull base. Skull and upper cervical spine: Normal marrow signal. Sinuses/Orbits: Clear sinuses.  No acute orbital findings. Other: No meso his free IMPRESSION: 1. No evidence of acute intracranial abnormality or metastatic disease. 2. Advanced chronic microvascular ischemic change. Electronically Signed   By: Feliberto Harts M.D.   On: 11/28/2022 13:32   ECHOCARDIOGRAM COMPLETE  Result Date: 11/27/2022    ECHOCARDIOGRAM REPORT   Patient Name:   Matthew Mcgrath Date of Exam: 11/27/2022 Medical Rec #:  [id number]      Height:       69.0 in Accession #:    [id number]     Weight:       193.8 lb Date of Birth:  08/20/1945      BSA:          2.038 m Patient Age:    77 years       BP:           117/78 mmHg Patient Gender: M              HR:           81 bpm. Exam Location:  Inpatient Procedure: 2D Echo, Cardiac Doppler and Color Doppler Indications:    stroke  History:        Patient has prior history of Echocardiogram examinations, most                 recent 05/22/2019. Cardiomyopathy, Defibrillator, Cancer; Risk                 Factors:Diabetes and Hypertension.  Sonographer:    Delcie Roch RDCS Referring Phys: [id number]  TIMOTHY S OPYD IMPRESSIONS  1. Left ventricular ejection fraction, by estimation, is 40 to 45%. The left ventricle has mildly decreased function. The left ventricle demonstrates global hypokinesis. Left ventricular diastolic parameters were normal.  2. Right ventricular systolic function is normal. The right ventricular size is normal.  3. The mitral valve is normal in structure. Mild mitral valve regurgitation. No evidence of mitral stenosis.  4. The aortic valve is normal in structure. Aortic valve regurgitation is not visualized. No aortic stenosis is present.  5. The inferior vena cava is normal in size with greater than 50% respiratory variability, suggesting right atrial pressure of 3 mmHg. Comparison(s): Prior images reviewed side by side. 2021: EF 45%. FINDINGS  Left Ventricle: Left ventricular ejection fraction, by estimation, is 40 to 45%. The left ventricle has mildly decreased function. The left ventricle demonstrates global hypokinesis. The left ventricular internal cavity size was normal in size. There is  no left ventricular hypertrophy. Left ventricular diastolic parameters were normal. Right Ventricle: The right ventricular size is normal. No increase in right ventricular wall thickness. Right ventricular systolic function is normal. Left Atrium: Left atrial size was normal in size. Right Atrium: Right atrial size was normal in size. Pericardium: There is no evidence of pericardial effusion. Mitral Valve: The mitral valve is normal in structure. Mild mitral valve regurgitation. No evidence of mitral valve stenosis. Tricuspid Valve: The tricuspid valve is normal in structure. Tricuspid valve regurgitation is not demonstrated. No evidence of tricuspid stenosis. Aortic Valve: The aortic valve is normal in structure. Aortic valve regurgitation is not visualized. No aortic stenosis is present. Pulmonic Valve: The pulmonic valve was normal in structure. Pulmonic valve regurgitation is not visualized. No  evidence of pulmonic stenosis. Aorta: The aortic root is normal in size and structure. Venous: The inferior vena cava is normal  in size with greater than 50% respiratory variability, suggesting right atrial pressure of 3 mmHg. IAS/Shunts: No atrial level shunt detected by color flow Doppler. Additional Comments: A device lead is visualized in the right ventricle.  LEFT VENTRICLE PLAX 2D LVIDd:         4.50 cm LVIDs:         3.70 cm LV PW:         1.00 cm LV IVS:        1.10 cm LVOT diam:     1.90 cm LV SV:         35 LV SV Index:   17 LVOT Area:     2.84 cm  LV Volumes (MOD) LV vol d, MOD A4C: 99.3 ml LV vol s, MOD A4C: 63.2 ml LV SV MOD A4C:     99.3 ml RIGHT VENTRICLE             IVC RV Basal diam:  3.50 cm     IVC diam: 1.70 cm RV S prime:     14.10 cm/s TAPSE (M-mode): 2.6 cm LEFT ATRIUM           Index        RIGHT ATRIUM           Index LA diam:      3.40 cm 1.67 cm/m   RA Area:     18.10 cm LA Vol (A4C): 47.3 ml 23.20 ml/m  RA Volume:   48.70 ml  23.89 ml/m  AORTIC VALVE LVOT Vmax:   67.90 cm/s LVOT Vmean:  43.500 cm/s LVOT VTI:    0.122 m  AORTA Ao Root diam: 3.20 cm Ao Asc diam:  3.60 cm  SHUNTS Systemic VTI:  0.12 m Systemic Diam: 1.90 cm Donato Schultz MD Electronically signed by Donato Schultz MD Signature Date/Time: 11/27/2022/11:35:53 AM    Final    EEG adult  Result Date: 11/27/2022 Charlsie Quest, MD     11/27/2022  5:45 AM Patient Name: Matthew Mcgrath MRN: [id number] Epilepsy Attending: Charlsie Quest Referring Physician/Provider: Briscoe Deutscher, MD Date: 11/27/2022 Duration: 27.14 mins Patient history: 77yo M with ams getting eeg to evaluate for seizure Level of alertness: Awake, asleep AEDs during EEG study: None Technical aspects: This EEG study was done with scalp electrodes positioned according to the 10-20 International system of electrode placement. Electrical activity was reviewed with band pass filter of 1-70Hz , sensitivity of 7 uV/mm, display speed of 68mm/sec with a 60Hz  notched  filter applied as appropriate. EEG data were recorded continuously and digitally stored.  Video monitoring was available and reviewed as appropriate. Description: The posterior dominant rhythm consists of 7.5 Hz activity of moderate voltage (25-35 uV) seen predominantly in posterior head regions, symmetric and reactive to eye opening and eye closing. Sleep was characterized by vertex waves, sleep spindles (12 to 14 Hz), maximal frontocentral region. EEG showed continuous generalized 5 to 6 Hz theta slowing. Hyperventilation and photic stimulation were not performed.   ABNORMALITY - Continuous slow, generalized IMPRESSION: This study is suggestive of mild diffuse encephalopathy. No seizures or epileptiform discharges were seen throughout the recording. Priyanka Annabelle Harman        Scheduled Meds:  aspirin EC  81 mg Oral Daily   carvedilol  3.125 mg Oral BID WC   enoxaparin (LOVENOX) injection  40 mg Subcutaneous Q24H   insulin aspart  0-5 Units Subcutaneous QHS   insulin aspart  0-6 Units Subcutaneous TID WC  olaparib  150 mg Oral BID   And   olaparib  100 mg Oral BID   pantoprazole  40 mg Oral Daily   senna  2 tablet Oral Daily   sodium chloride flush  3 mL Intravenous Q12H   spironolactone  12.5 mg Oral Daily   tamsulosin  0.4 mg Oral Daily   Continuous Infusions:  sodium chloride       LOS: 0 days       Huey Bienenstock, MD [facility]   To contact the attending provider between 7A-7P or the covering provider during after hours 7P-7A, please log into the web site [email or web address] and access using universal  password for that web site. If you do not have the password, please call the hospital operator.  11/28/2022, 3:28 PM

## 2022-11-29 DIAGNOSIS — I429 Cardiomyopathy, unspecified: Secondary | ICD-10-CM | POA: Diagnosis not present

## 2022-11-29 DIAGNOSIS — G9340 Encephalopathy, unspecified: Secondary | ICD-10-CM | POA: Diagnosis not present

## 2022-11-29 DIAGNOSIS — E119 Type 2 diabetes mellitus without complications: Secondary | ICD-10-CM | POA: Diagnosis not present

## 2022-11-29 DIAGNOSIS — C61 Malignant neoplasm of prostate: Secondary | ICD-10-CM | POA: Diagnosis not present

## 2022-11-29 LAB — BASIC METABOLIC PANEL
Anion gap: 12 (ref 5–15)
BUN: 11 mg/dL (ref 8–23)
CO2: 21 mmol/L — ABNORMAL LOW (ref 22–32)
Calcium: 8.9 mg/dL (ref 8.9–10.3)
Chloride: 104 mmol/L (ref 98–111)
Creatinine, Ser: 0.78 mg/dL (ref 0.61–1.24)
GFR, Estimated: >60 mL/min (ref >60)
Glucose, Bld: 173 mg/dL — ABNORMAL HIGH (ref 70–99)
Potassium: 3.9 mmol/L (ref 3.5–5.1)
Sodium: 137 mmol/L (ref 135–145)

## 2022-11-29 LAB — CBC
HCT: 32.2 % — ABNORMAL LOW (ref 39.0–52.0)
Hemoglobin: 10.2 g/dL — ABNORMAL LOW (ref 13.0–17.0)
MCH: 31.8 pg (ref 26.0–34.0)
MCHC: 31.7 g/dL (ref 30.0–36.0)
MCV: 100.3 fL — ABNORMAL HIGH (ref 80.0–100.0)
Platelets: 175 10*3/uL (ref 150–400)
RBC: 3.21 MIL/uL — ABNORMAL LOW (ref 4.22–5.81)
RDW: 15.1 % (ref 11.5–15.5)
WBC: 3 10*3/uL — ABNORMAL LOW (ref 4.0–10.5)
nRBC: 0 % (ref 0.0–0.2)

## 2022-11-29 LAB — GLUCOSE, CAPILLARY
Glucose-Capillary: 156 mg/dL — ABNORMAL HIGH (ref 70–99)
Glucose-Capillary: 170 mg/dL — ABNORMAL HIGH (ref 70–99)

## 2022-11-29 LAB — CULTURE, BLOOD (ROUTINE X 2): Special Requests: ADEQUATE

## 2022-11-29 LAB — RPR: RPR Ser Ql: NONREACTIVE

## 2022-11-29 MED ORDER — ACETAMINOPHEN 325 MG PO TABS: 650.0000 mg | ORAL_TABLET | ORAL | Status: DC | PRN

## 2022-11-29 NOTE — Progress Notes (Signed)
Report called to [place] RN at [facility].

## 2022-11-29 NOTE — Discharge Instructions (Addendum)
Follow with [facility]   Get CBC, CMP,  by Primary MD next visit.    Activity: As tolerated with Full fall precautions use walker/cane & assistance as needed   Disposition Home   Diet: Regular DIet  On your next visit with your primary care physician please Get Medicines reviewed and adjusted.   Please request your Prim.MD to go over all Hospital Tests and Procedure/Radiological results at the follow up, please get all Hospital records sent to your Prim MD by signing hospital release before you go home.   If you experience worsening of your admission symptoms, develop shortness of breath, life threatening emergency, suicidal or homicidal thoughts you must seek medical attention immediately by calling 911 or calling your MD immediately  if symptoms less severe.  You Must read complete instructions/literature along with all the possible adverse reactions/side effects for all the Medicines you take and that have been prescribed to you. Take any new Medicines after you have completely understood and accpet all the possible adverse reactions/side effects.   Do not drive, operating heavy machinery, perform activities at heights, swimming or participation in water activities or provide baby sitting services if your were admitted for syncope or siezures until you have seen by Primary MD or a Neurologist and advised to do so again.  Do not drive when taking Pain medications.    Do not take more than prescribed Pain, Sleep and Anxiety Medications  Special Instructions: If you have smoked or chewed Tobacco  in the last 2 yrs please stop smoking, stop any regular Alcohol  and or any Recreational drug use.  Wear Seat belts while driving.   Please note  You were cared for by a hospitalist during your hospital stay. If you have any questions about your discharge medications or the care you received while you were in the hospital after you are discharged, you can  call the unit and asked to speak with the hospitalist on call if the hospitalist that took care of you is not available. Once you are discharged, your primary care physician will handle any further medical issues. Please note that NO REFILLS for any discharge medications will be authorized once you are discharged, as it is imperative that you return to your primary care physician (or establish a relationship with a primary care physician if you do not have one) for your aftercare needs so that they can reassess your need for medications and monitor your lab values.

## 2022-11-29 NOTE — Discharge Summary (Signed)
Physician Discharge Summary  CAINEN RICCO UJW:[id number] DOB: 1945/11/25 DOA: 11/26/2022  PCP: [facility]  Admit date: 11/26/2022 Discharge date: 11/29/2022  Admitted From: (Home) Disposition:  (SNF)  Recommendations for Outpatient Follow-up:  Will need outpatient palliative consult  Diet recommendation:Regular   Brief/Interim Summary: Matthew Mcgrath is a 77 y.o. male with medical history significant for type 2 diabetes mellitus, hypertension, cardiomyopathy with ICD, prostate cancer metastatic to bone, and chronic right lower extremity weakness who presented to the ED after a fall and AMS, Patient lives alone, uses a wheelchair or walker at baseline, and was found on the floor with confusion, slurred speech, and facial droop noted by family.  Upon arrival to the ED, patient is found to be afebrile and saturating well on room air with normal heart rate and stable blood pressure.  EKG demonstrates sinus rhythm.  Chest x-ray is negative for acute cardiopulmonary disease.  Head CT is notable for questionable left frontal cortex infarction that is new from the MRI in April 2024. Patient was evaluated by neurology and medical admission to [facility] was recommended as MRI cannot be performed at [facility] due to the patient's ICD.  So he was transferred to [facility], MRI brain was done 9/23, with no evidence of acute ischemic event.   Acute encephalopathy Falls  Acute CVA ruled out. 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 - Passed swallow screen, seen by PT/OT, recommendation for SNF placement. -Continue with aspirin, await further recommendation from neurology . -A1C7.1, LDL acceptable at 66 2D echo with a EF 40 to 45%, which is  his baseline, left atrium size within normal limit. -EEG significant for encepalopathy, but no seizure activities -Workup including B12, TSH, RPR all within normal limits.  -Patient lives alone, frail, deconditioned, likely he is having some baseline mild cognitive impairments, but his neuroimaging negative for acute stroke, dense of seizures, labs for reversible causes of dementia were negative including B12, TSH and RPR, patient will be best served at [facility] for subacute rehab.   Prostate cancer  - Castration resistant, metastatic to bone, s/p palliative RT  - Currently on olaparib under the care of Dr. Cathie Hoops    Type II DM  -A1c 7.1 this admission -Insulin Sliding Scale during Hospital Stay, He Will Be Resumed on His Home Dose Metformin at Time of Discharge   Chronic HFmrEF  - EF 45-50% on TTE from October 2022  - Appears compensated  - Continue Coreg and Aldactone, monitor volume status         Discharge Diagnoses:  Principal Problem:   Acute encephalopathy Active Problems:   Prostate cancer (HCC)   Cardiomyopathy (HCC)   Insulin dependent type 2 diabetes mellitus (HCC)   Stroke [facility])    Discharge Instructions  Discharge Instructions     Diet - low sodium heart healthy   Complete by: As directed    Discharge instructions   Complete by: As directed    [facility]   Get CBC, CMP,  by Primary MD next visit.    Activity: As tolerated with Full fall precautions use walker/cane & assistance as needed   Disposition Home   Diet: Regular DIet  On  the skin.   Lynparza 150 MG tablet Generic drug: olaparib Take 150 mg by mouth 2 (two) times daily.   Lynparza 100 MG tablet Generic drug: olaparib Take 100 mg by mouth 2 (two) times daily.   magnesium oxide 400 (240 Mg) MG tablet Commonly known as: MAG-OX Take 1 tablet by mouth daily.   OMEGA 3 PO Take 1 capsule by mouth 2 (two) times daily.   omeprazole 10 MG capsule Commonly known as: PRILOSEC Take 10 mg by mouth daily.   One-Daily Multi-Vitamin Tabs Take 1 tablet by mouth daily.   senna 8.6 MG Tabs tablet Commonly known as: SENOKOT Take 2 tablets by mouth daily.   spironolactone 25 MG tablet Commonly known as: ALDACTONE Take 0.5 tablets (12.5 mg total) by mouth daily.   tamsulosin 0.4 MG Caps capsule Commonly known as: FLOMAX Take 1 capsule (0.4 mg total) by mouth daily.        No Known  Allergies  Consultations: Neurology   Procedures/Studies: MR BRAIN W WO CONTRAST  Result Date: 11/28/2022 CLINICAL DATA:  Stroke, follow up Metastatic disease evaluation EXAM: MRI HEAD WITHOUT AND WITH CONTRAST TECHNIQUE: Multiplanar, multiecho pulse sequences of the brain and surrounding structures were obtained without and with intravenous contrast. CONTRAST:  8mL GADAVIST GADOBUTROL 1 MMOL/ML IV SOLN COMPARISON:  CT head 11/26/2022. FINDINGS: Brain: No acute infarction, hemorrhage, hydrocephalus, extra-axial collection or mass lesion. Advanced confluent and patchy T2/FLAIR hyperintensities in the white matter, nonspecific but compatible with chronic microvascular ischemic disease. 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 No pathologic enhancement. Vascular: Major arterial flow voids are maintained at the skull base. Skull and upper cervical spine: Normal marrow signal. Sinuses/Orbits: Clear sinuses.  No acute orbital findings. Other: No meso his free IMPRESSION: 1. No evidence of acute intracranial abnormality or metastatic disease. 2. Advanced chronic microvascular ischemic change. Electronically Signed   By: Feliberto Harts M.D.   On: 11/28/2022 13:32   ECHOCARDIOGRAM COMPLETE  Result Date: 11/27/2022    ECHOCARDIOGRAM REPORT   Patient Name:   MATHIAS Matthew Mcgrath Date of Exam: 11/27/2022 Medical Rec #:  [id number]      Height:       69.0 in Accession #:    [id number]     Weight:       193.8 lb Date of Birth:  1945/05/19      BSA:          2.038 m Patient Age:    77 years       BP:           117/78 mmHg Patient Gender: M              HR:           81 bpm. Exam Location:  Inpatient Procedure: 2D Echo, Cardiac Doppler and Color Doppler Indications:    stroke  History:        Patient has prior history of Echocardiogram examinations, most                 recent 05/22/2019. Cardiomyopathy, Defibrillator, Cancer; Risk                 Factors:Diabetes and Hypertension.  Sonographer:    Delcie Roch RDCS  Referring Phys: [id number] TIMOTHY S OPYD IMPRESSIONS  1. Left ventricular ejection fraction, by estimation, is 40 to 45%. The left ventricle has mildly decreased function. The left ventricle demonstrates global hypokinesis. Left ventricular diastolic parameters were normal.  2. Right ventricular systolic function is  the skin.   Lynparza 150 MG tablet Generic drug: olaparib Take 150 mg by mouth 2 (two) times daily.   Lynparza 100 MG tablet Generic drug: olaparib Take 100 mg by mouth 2 (two) times daily.   magnesium oxide 400 (240 Mg) MG tablet Commonly known as: MAG-OX Take 1 tablet by mouth daily.   OMEGA 3 PO Take 1 capsule by mouth 2 (two) times daily.   omeprazole 10 MG capsule Commonly known as: PRILOSEC Take 10 mg by mouth daily.   One-Daily Multi-Vitamin Tabs Take 1 tablet by mouth daily.   senna 8.6 MG Tabs tablet Commonly known as: SENOKOT Take 2 tablets by mouth daily.   spironolactone 25 MG tablet Commonly known as: ALDACTONE Take 0.5 tablets (12.5 mg total) by mouth daily.   tamsulosin 0.4 MG Caps capsule Commonly known as: FLOMAX Take 1 capsule (0.4 mg total) by mouth daily.        No Known  Allergies  Consultations: Neurology   Procedures/Studies: MR BRAIN W WO CONTRAST  Result Date: 11/28/2022 CLINICAL DATA:  Stroke, follow up Metastatic disease evaluation EXAM: MRI HEAD WITHOUT AND WITH CONTRAST TECHNIQUE: Multiplanar, multiecho pulse sequences of the brain and surrounding structures were obtained without and with intravenous contrast. CONTRAST:  8mL GADAVIST GADOBUTROL 1 MMOL/ML IV SOLN COMPARISON:  CT head 11/26/2022. FINDINGS: Brain: No acute infarction, hemorrhage, hydrocephalus, extra-axial collection or mass lesion. Advanced confluent and patchy T2/FLAIR hyperintensities in the white matter, nonspecific but compatible with chronic microvascular ischemic disease. 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 No pathologic enhancement. Vascular: Major arterial flow voids are maintained at the skull base. Skull and upper cervical spine: Normal marrow signal. Sinuses/Orbits: Clear sinuses.  No acute orbital findings. Other: No meso his free IMPRESSION: 1. No evidence of acute intracranial abnormality or metastatic disease. 2. Advanced chronic microvascular ischemic change. Electronically Signed   By: Feliberto Harts M.D.   On: 11/28/2022 13:32   ECHOCARDIOGRAM COMPLETE  Result Date: 11/27/2022    ECHOCARDIOGRAM REPORT   Patient Name:   MATHIAS Matthew Mcgrath Date of Exam: 11/27/2022 Medical Rec #:  [id number]      Height:       69.0 in Accession #:    [id number]     Weight:       193.8 lb Date of Birth:  1945/05/19      BSA:          2.038 m Patient Age:    77 years       BP:           117/78 mmHg Patient Gender: M              HR:           81 bpm. Exam Location:  Inpatient Procedure: 2D Echo, Cardiac Doppler and Color Doppler Indications:    stroke  History:        Patient has prior history of Echocardiogram examinations, most                 recent 05/22/2019. Cardiomyopathy, Defibrillator, Cancer; Risk                 Factors:Diabetes and Hypertension.  Sonographer:    Delcie Roch RDCS  Referring Phys: [id number] TIMOTHY S OPYD IMPRESSIONS  1. Left ventricular ejection fraction, by estimation, is 40 to 45%. The left ventricle has mildly decreased function. The left ventricle demonstrates global hypokinesis. Left ventricular diastolic parameters were normal.  2. Right ventricular systolic function is  Physician Discharge Summary  CAINEN RICCO UJW:[id number] DOB: 1945/11/25 DOA: 11/26/2022  PCP: [facility]  Admit date: 11/26/2022 Discharge date: 11/29/2022  Admitted From: (Home) Disposition:  (SNF)  Recommendations for Outpatient Follow-up:  Will need outpatient palliative consult  Diet recommendation:Regular   Brief/Interim Summary: Matthew Mcgrath is a 77 y.o. male with medical history significant for type 2 diabetes mellitus, hypertension, cardiomyopathy with ICD, prostate cancer metastatic to bone, and chronic right lower extremity weakness who presented to the ED after a fall and AMS, Patient lives alone, uses a wheelchair or walker at baseline, and was found on the floor with confusion, slurred speech, and facial droop noted by family.  Upon arrival to the ED, patient is found to be afebrile and saturating well on room air with normal heart rate and stable blood pressure.  EKG demonstrates sinus rhythm.  Chest x-ray is negative for acute cardiopulmonary disease.  Head CT is notable for questionable left frontal cortex infarction that is new from the MRI in April 2024. Patient was evaluated by neurology and medical admission to [facility] was recommended as MRI cannot be performed at [facility] due to the patient's ICD.  So he was transferred to [facility], MRI brain was done 9/23, with no evidence of acute ischemic event.   Acute encephalopathy Falls  Acute CVA ruled out. 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 - Passed swallow screen, seen by PT/OT, recommendation for SNF placement. -Continue with aspirin, await further recommendation from neurology . -A1C7.1, LDL acceptable at 66 2D echo with a EF 40 to 45%, which is  his baseline, left atrium size within normal limit. -EEG significant for encepalopathy, but no seizure activities -Workup including B12, TSH, RPR all within normal limits.  -Patient lives alone, frail, deconditioned, likely he is having some baseline mild cognitive impairments, but his neuroimaging negative for acute stroke, dense of seizures, labs for reversible causes of dementia were negative including B12, TSH and RPR, patient will be best served at [facility] for subacute rehab.   Prostate cancer  - Castration resistant, metastatic to bone, s/p palliative RT  - Currently on olaparib under the care of Dr. Cathie Hoops    Type II DM  -A1c 7.1 this admission -Insulin Sliding Scale during Hospital Stay, He Will Be Resumed on His Home Dose Metformin at Time of Discharge   Chronic HFmrEF  - EF 45-50% on TTE from October 2022  - Appears compensated  - Continue Coreg and Aldactone, monitor volume status         Discharge Diagnoses:  Principal Problem:   Acute encephalopathy Active Problems:   Prostate cancer (HCC)   Cardiomyopathy (HCC)   Insulin dependent type 2 diabetes mellitus (HCC)   Stroke [facility])    Discharge Instructions  Discharge Instructions     Diet - low sodium heart healthy   Complete by: As directed    Discharge instructions   Complete by: As directed    Follow with [facility]   Get CBC, CMP,  by Primary MD next visit.    Activity: As tolerated with Full fall precautions use walker/cane & assistance as needed   Disposition Home   Diet: Regular DIet  On  Physician Discharge Summary  CAINEN RICCO UJW:[id number] DOB: 1945/11/25 DOA: 11/26/2022  PCP: [facility]  Admit date: 11/26/2022 Discharge date: 11/29/2022  Admitted From: (Home) Disposition:  (SNF)  Recommendations for Outpatient Follow-up:  Will need outpatient palliative consult  Diet recommendation:Regular   Brief/Interim Summary: Matthew Mcgrath is a 77 y.o. male with medical history significant for type 2 diabetes mellitus, hypertension, cardiomyopathy with ICD, prostate cancer metastatic to bone, and chronic right lower extremity weakness who presented to the ED after a fall and AMS, Patient lives alone, uses a wheelchair or walker at baseline, and was found on the floor with confusion, slurred speech, and facial droop noted by family.  Upon arrival to the ED, patient is found to be afebrile and saturating well on room air with normal heart rate and stable blood pressure.  EKG demonstrates sinus rhythm.  Chest x-ray is negative for acute cardiopulmonary disease.  Head CT is notable for questionable left frontal cortex infarction that is new from the MRI in April 2024. Patient was evaluated by neurology and medical admission to [facility] was recommended as MRI cannot be performed at [facility] due to the patient's ICD.  So he was transferred to [facility], MRI brain was done 9/23, with no evidence of acute ischemic event.   Acute encephalopathy Falls  Acute CVA ruled out. 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 - Passed swallow screen, seen by PT/OT, recommendation for SNF placement. -Continue with aspirin, await further recommendation from neurology . -A1C7.1, LDL acceptable at 66 2D echo with a EF 40 to 45%, which is  his baseline, left atrium size within normal limit. -EEG significant for encepalopathy, but no seizure activities -Workup including B12, TSH, RPR all within normal limits.  -Patient lives alone, frail, deconditioned, likely he is having some baseline mild cognitive impairments, but his neuroimaging negative for acute stroke, dense of seizures, labs for reversible causes of dementia were negative including B12, TSH and RPR, patient will be best served at [facility] for subacute rehab.   Prostate cancer  - Castration resistant, metastatic to bone, s/p palliative RT  - Currently on olaparib under the care of Dr. Cathie Hoops    Type II DM  -A1c 7.1 this admission -Insulin Sliding Scale during Hospital Stay, He Will Be Resumed on His Home Dose Metformin at Time of Discharge   Chronic HFmrEF  - EF 45-50% on TTE from October 2022  - Appears compensated  - Continue Coreg and Aldactone, monitor volume status         Discharge Diagnoses:  Principal Problem:   Acute encephalopathy Active Problems:   Prostate cancer (HCC)   Cardiomyopathy (HCC)   Insulin dependent type 2 diabetes mellitus (HCC)   Stroke [facility])    Discharge Instructions  Discharge Instructions     Diet - low sodium heart healthy   Complete by: As directed    Discharge instructions   Complete by: As directed    Follow with [facility]   Get CBC, CMP,  by Primary MD next visit.    Activity: As tolerated with Full fall precautions use walker/cane & assistance as needed   Disposition Home   Diet: Regular DIet  On  Physician Discharge Summary  CAINEN RICCO UJW:[id number] DOB: 1945/11/25 DOA: 11/26/2022  PCP: [facility]  Admit date: 11/26/2022 Discharge date: 11/29/2022  Admitted From: (Home) Disposition:  (SNF)  Recommendations for Outpatient Follow-up:  Will need outpatient palliative consult  Diet recommendation:Regular   Brief/Interim Summary: Matthew Mcgrath is a 77 y.o. male with medical history significant for type 2 diabetes mellitus, hypertension, cardiomyopathy with ICD, prostate cancer metastatic to bone, and chronic right lower extremity weakness who presented to the ED after a fall and AMS, Patient lives alone, uses a wheelchair or walker at baseline, and was found on the floor with confusion, slurred speech, and facial droop noted by family.  Upon arrival to the ED, patient is found to be afebrile and saturating well on room air with normal heart rate and stable blood pressure.  EKG demonstrates sinus rhythm.  Chest x-ray is negative for acute cardiopulmonary disease.  Head CT is notable for questionable left frontal cortex infarction that is new from the MRI in April 2024. Patient was evaluated by neurology and medical admission to [facility] was recommended as MRI cannot be performed at [facility] due to the patient's ICD.  So he was transferred to [facility], MRI brain was done 9/23, with no evidence of acute ischemic event.   Acute encephalopathy Falls  Acute CVA ruled out. 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 - Passed swallow screen, seen by PT/OT, recommendation for SNF placement. -Continue with aspirin, await further recommendation from neurology . -A1C7.1, LDL acceptable at 66 2D echo with a EF 40 to 45%, which is  his baseline, left atrium size within normal limit. -EEG significant for encepalopathy, but no seizure activities -Workup including B12, TSH, RPR all within normal limits.  -Patient lives alone, frail, deconditioned, likely he is having some baseline mild cognitive impairments, but his neuroimaging negative for acute stroke, dense of seizures, labs for reversible causes of dementia were negative including B12, TSH and RPR, patient will be best served at [facility] for subacute rehab.   Prostate cancer  - Castration resistant, metastatic to bone, s/p palliative RT  - Currently on olaparib under the care of Dr. Cathie Hoops    Type II DM  -A1c 7.1 this admission -Insulin Sliding Scale during Hospital Stay, He Will Be Resumed on His Home Dose Metformin at Time of Discharge   Chronic HFmrEF  - EF 45-50% on TTE from October 2022  - Appears compensated  - Continue Coreg and Aldactone, monitor volume status         Discharge Diagnoses:  Principal Problem:   Acute encephalopathy Active Problems:   Prostate cancer (HCC)   Cardiomyopathy (HCC)   Insulin dependent type 2 diabetes mellitus (HCC)   Stroke [facility])    Discharge Instructions  Discharge Instructions     Diet - low sodium heart healthy   Complete by: As directed    Discharge instructions   Complete by: As directed    Follow with [facility]   Get CBC, CMP,  by Primary MD next visit.    Activity: As tolerated with Full fall precautions use walker/cane & assistance as needed   Disposition Home   Diet: Regular DIet  On  Physician Discharge Summary  CAINEN RICCO UJW:[id number] DOB: 1945/11/25 DOA: 11/26/2022  PCP: [facility]  Admit date: 11/26/2022 Discharge date: 11/29/2022  Admitted From: (Home) Disposition:  (SNF)  Recommendations for Outpatient Follow-up:  Will need outpatient palliative consult  Diet recommendation:Regular   Brief/Interim Summary: Matthew Mcgrath is a 77 y.o. male with medical history significant for type 2 diabetes mellitus, hypertension, cardiomyopathy with ICD, prostate cancer metastatic to bone, and chronic right lower extremity weakness who presented to the ED after a fall and AMS, Patient lives alone, uses a wheelchair or walker at baseline, and was found on the floor with confusion, slurred speech, and facial droop noted by family.  Upon arrival to the ED, patient is found to be afebrile and saturating well on room air with normal heart rate and stable blood pressure.  EKG demonstrates sinus rhythm.  Chest x-ray is negative for acute cardiopulmonary disease.  Head CT is notable for questionable left frontal cortex infarction that is new from the MRI in April 2024. Patient was evaluated by neurology and medical admission to [facility] was recommended as MRI cannot be performed at [facility] due to the patient's ICD.  So he was transferred to [facility], MRI brain was done 9/23, with no evidence of acute ischemic event.   Acute encephalopathy Falls  Acute CVA ruled out. 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 - Passed swallow screen, seen by PT/OT, recommendation for SNF placement. -Continue with aspirin, await further recommendation from neurology . -A1C7.1, LDL acceptable at 66 2D echo with a EF 40 to 45%, which is  his baseline, left atrium size within normal limit. -EEG significant for encepalopathy, but no seizure activities -Workup including B12, TSH, RPR all within normal limits.  -Patient lives alone, frail, deconditioned, likely he is having some baseline mild cognitive impairments, but his neuroimaging negative for acute stroke, dense of seizures, labs for reversible causes of dementia were negative including B12, TSH and RPR, patient will be best served at [facility] for subacute rehab.   Prostate cancer  - Castration resistant, metastatic to bone, s/p palliative RT  - Currently on olaparib under the care of Dr. Cathie Hoops    Type II DM  -A1c 7.1 this admission -Insulin Sliding Scale during Hospital Stay, He Will Be Resumed on His Home Dose Metformin at Time of Discharge   Chronic HFmrEF  - EF 45-50% on TTE from October 2022  - Appears compensated  - Continue Coreg and Aldactone, monitor volume status         Discharge Diagnoses:  Principal Problem:   Acute encephalopathy Active Problems:   Prostate cancer (HCC)   Cardiomyopathy (HCC)   Insulin dependent type 2 diabetes mellitus (HCC)   Stroke [facility])    Discharge Instructions  Discharge Instructions     Diet - low sodium heart healthy   Complete by: As directed    Discharge instructions   Complete by: As directed    Follow with [facility]   Get CBC, CMP,  by Primary MD next visit.    Activity: As tolerated with Full fall precautions use walker/cane & assistance as needed   Disposition Home   Diet: Regular DIet  On  Physician Discharge Summary  CAINEN RICCO UJW:[id number] DOB: 1945/11/25 DOA: 11/26/2022  PCP: [facility]  Admit date: 11/26/2022 Discharge date: 11/29/2022  Admitted From: (Home) Disposition:  (SNF)  Recommendations for Outpatient Follow-up:  Will need outpatient palliative consult  Diet recommendation:Regular   Brief/Interim Summary: Matthew Mcgrath is a 77 y.o. male with medical history significant for type 2 diabetes mellitus, hypertension, cardiomyopathy with ICD, prostate cancer metastatic to bone, and chronic right lower extremity weakness who presented to the ED after a fall and AMS, Patient lives alone, uses a wheelchair or walker at baseline, and was found on the floor with confusion, slurred speech, and facial droop noted by family.  Upon arrival to the ED, patient is found to be afebrile and saturating well on room air with normal heart rate and stable blood pressure.  EKG demonstrates sinus rhythm.  Chest x-ray is negative for acute cardiopulmonary disease.  Head CT is notable for questionable left frontal cortex infarction that is new from the MRI in April 2024. Patient was evaluated by neurology and medical admission to [facility] was recommended as MRI cannot be performed at [facility] due to the patient's ICD.  So he was transferred to [facility], MRI brain was done 9/23, with no evidence of acute ischemic event.   Acute encephalopathy Falls  Acute CVA ruled out. 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 - Passed swallow screen, seen by PT/OT, recommendation for SNF placement. -Continue with aspirin, await further recommendation from neurology . -A1C7.1, LDL acceptable at 66 2D echo with a EF 40 to 45%, which is  his baseline, left atrium size within normal limit. -EEG significant for encepalopathy, but no seizure activities -Workup including B12, TSH, RPR all within normal limits.  -Patient lives alone, frail, deconditioned, likely he is having some baseline mild cognitive impairments, but his neuroimaging negative for acute stroke, dense of seizures, labs for reversible causes of dementia were negative including B12, TSH and RPR, patient will be best served at [facility] for subacute rehab.   Prostate cancer  - Castration resistant, metastatic to bone, s/p palliative RT  - Currently on olaparib under the care of Dr. Cathie Hoops    Type II DM  -A1c 7.1 this admission -Insulin Sliding Scale during Hospital Stay, He Will Be Resumed on His Home Dose Metformin at Time of Discharge   Chronic HFmrEF  - EF 45-50% on TTE from October 2022  - Appears compensated  - Continue Coreg and Aldactone, monitor volume status         Discharge Diagnoses:  Principal Problem:   Acute encephalopathy Active Problems:   Prostate cancer (HCC)   Cardiomyopathy (HCC)   Insulin dependent type 2 diabetes mellitus (HCC)   Stroke [facility])    Discharge Instructions  Discharge Instructions     Diet - low sodium heart healthy   Complete by: As directed    Discharge instructions   Complete by: As directed    Follow with [facility]   Get CBC, CMP,  by Primary MD next visit.    Activity: As tolerated with Full fall precautions use walker/cane & assistance as needed   Disposition Home   Diet: Regular DIet  On  Physician Discharge Summary  CAINEN RICCO UJW:[id number] DOB: 1945/11/25 DOA: 11/26/2022  PCP: [facility]  Admit date: 11/26/2022 Discharge date: 11/29/2022  Admitted From: (Home) Disposition:  (SNF)  Recommendations for Outpatient Follow-up:  Will need outpatient palliative consult  Diet recommendation:Regular   Brief/Interim Summary: Matthew Mcgrath is a 77 y.o. male with medical history significant for type 2 diabetes mellitus, hypertension, cardiomyopathy with ICD, prostate cancer metastatic to bone, and chronic right lower extremity weakness who presented to the ED after a fall and AMS, Patient lives alone, uses a wheelchair or walker at baseline, and was found on the floor with confusion, slurred speech, and facial droop noted by family.  Upon arrival to the ED, patient is found to be afebrile and saturating well on room air with normal heart rate and stable blood pressure.  EKG demonstrates sinus rhythm.  Chest x-ray is negative for acute cardiopulmonary disease.  Head CT is notable for questionable left frontal cortex infarction that is new from the MRI in April 2024. Patient was evaluated by neurology and medical admission to [facility] was recommended as MRI cannot be performed at [facility] due to the patient's ICD.  So he was transferred to [facility], MRI brain was done 9/23, with no evidence of acute ischemic event.   Acute encephalopathy Falls  Acute CVA ruled out. 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 - Passed swallow screen, seen by PT/OT, recommendation for SNF placement. -Continue with aspirin, await further recommendation from neurology . -A1C7.1, LDL acceptable at 66 2D echo with a EF 40 to 45%, which is  his baseline, left atrium size within normal limit. -EEG significant for encepalopathy, but no seizure activities -Workup including B12, TSH, RPR all within normal limits.  -Patient lives alone, frail, deconditioned, likely he is having some baseline mild cognitive impairments, but his neuroimaging negative for acute stroke, dense of seizures, labs for reversible causes of dementia were negative including B12, TSH and RPR, patient will be best served at [facility] for subacute rehab.   Prostate cancer  - Castration resistant, metastatic to bone, s/p palliative RT  - Currently on olaparib under the care of Dr. Cathie Hoops    Type II DM  -A1c 7.1 this admission -Insulin Sliding Scale during Hospital Stay, He Will Be Resumed on His Home Dose Metformin at Time of Discharge   Chronic HFmrEF  - EF 45-50% on TTE from October 2022  - Appears compensated  - Continue Coreg and Aldactone, monitor volume status         Discharge Diagnoses:  Principal Problem:   Acute encephalopathy Active Problems:   Prostate cancer (HCC)   Cardiomyopathy (HCC)   Insulin dependent type 2 diabetes mellitus (HCC)   Stroke [facility])    Discharge Instructions  Discharge Instructions     Diet - low sodium heart healthy   Complete by: As directed    Discharge instructions   Complete by: As directed    Follow with [facility]   Get CBC, CMP,  by Primary MD next visit.    Activity: As tolerated with Full fall precautions use walker/cane & assistance as needed   Disposition Home   Diet: Regular DIet  On  Physician Discharge Summary  CAINEN RICCO UJW:[id number] DOB: 1945/11/25 DOA: 11/26/2022  PCP: [facility]  Admit date: 11/26/2022 Discharge date: 11/29/2022  Admitted From: (Home) Disposition:  (SNF)  Recommendations for Outpatient Follow-up:  Will need outpatient palliative consult  Diet recommendation:Regular   Brief/Interim Summary: Matthew Mcgrath is a 77 y.o. male with medical history significant for type 2 diabetes mellitus, hypertension, cardiomyopathy with ICD, prostate cancer metastatic to bone, and chronic right lower extremity weakness who presented to the ED after a fall and AMS, Patient lives alone, uses a wheelchair or walker at baseline, and was found on the floor with confusion, slurred speech, and facial droop noted by family.  Upon arrival to the ED, patient is found to be afebrile and saturating well on room air with normal heart rate and stable blood pressure.  EKG demonstrates sinus rhythm.  Chest x-ray is negative for acute cardiopulmonary disease.  Head CT is notable for questionable left frontal cortex infarction that is new from the MRI in April 2024. Patient was evaluated by neurology and medical admission to [facility] was recommended as MRI cannot be performed at [facility] due to the patient's ICD.  So he was transferred to [facility], MRI brain was done 9/23, with no evidence of acute ischemic event.   Acute encephalopathy Falls  Acute CVA ruled out. 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 - Passed swallow screen, seen by PT/OT, recommendation for SNF placement. -Continue with aspirin, await further recommendation from neurology . -A1C7.1, LDL acceptable at 66 2D echo with a EF 40 to 45%, which is  his baseline, left atrium size within normal limit. -EEG significant for encepalopathy, but no seizure activities -Workup including B12, TSH, RPR all within normal limits.  -Patient lives alone, frail, deconditioned, likely he is having some baseline mild cognitive impairments, but his neuroimaging negative for acute stroke, dense of seizures, labs for reversible causes of dementia were negative including B12, TSH and RPR, patient will be best served at [facility] for subacute rehab.   Prostate cancer  - Castration resistant, metastatic to bone, s/p palliative RT  - Currently on olaparib under the care of Dr. Cathie Hoops    Type II DM  -A1c 7.1 this admission -Insulin Sliding Scale during Hospital Stay, He Will Be Resumed on His Home Dose Metformin at Time of Discharge   Chronic HFmrEF  - EF 45-50% on TTE from October 2022  - Appears compensated  - Continue Coreg and Aldactone, monitor volume status         Discharge Diagnoses:  Principal Problem:   Acute encephalopathy Active Problems:   Prostate cancer (HCC)   Cardiomyopathy (HCC)   Insulin dependent type 2 diabetes mellitus (HCC)   Stroke [facility])    Discharge Instructions  Discharge Instructions     Diet - low sodium heart healthy   Complete by: As directed    Discharge instructions   Complete by: As directed    Follow with [facility]   Get CBC, CMP,  by Primary MD next visit.    Activity: As tolerated with Full fall precautions use walker/cane & assistance as needed   Disposition Home   Diet: Regular DIet  On  Physician Discharge Summary  CAINEN RICCO UJW:[id number] DOB: 1945/11/25 DOA: 11/26/2022  PCP: [facility]  Admit date: 11/26/2022 Discharge date: 11/29/2022  Admitted From: (Home) Disposition:  (SNF)  Recommendations for Outpatient Follow-up:  Will need outpatient palliative consult  Diet recommendation:Regular   Brief/Interim Summary: Matthew Mcgrath is a 77 y.o. male with medical history significant for type 2 diabetes mellitus, hypertension, cardiomyopathy with ICD, prostate cancer metastatic to bone, and chronic right lower extremity weakness who presented to the ED after a fall and AMS, Patient lives alone, uses a wheelchair or walker at baseline, and was found on the floor with confusion, slurred speech, and facial droop noted by family.  Upon arrival to the ED, patient is found to be afebrile and saturating well on room air with normal heart rate and stable blood pressure.  EKG demonstrates sinus rhythm.  Chest x-ray is negative for acute cardiopulmonary disease.  Head CT is notable for questionable left frontal cortex infarction that is new from the MRI in April 2024. Patient was evaluated by neurology and medical admission to [facility] was recommended as MRI cannot be performed at [facility] due to the patient's ICD.  So he was transferred to [facility], MRI brain was done 9/23, with no evidence of acute ischemic event.   Acute encephalopathy Falls  Acute CVA ruled out. 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 - Passed swallow screen, seen by PT/OT, recommendation for SNF placement. -Continue with aspirin, await further recommendation from neurology . -A1C7.1, LDL acceptable at 66 2D echo with a EF 40 to 45%, which is  his baseline, left atrium size within normal limit. -EEG significant for encepalopathy, but no seizure activities -Workup including B12, TSH, RPR all within normal limits.  -Patient lives alone, frail, deconditioned, likely he is having some baseline mild cognitive impairments, but his neuroimaging negative for acute stroke, dense of seizures, labs for reversible causes of dementia were negative including B12, TSH and RPR, patient will be best served at [facility] for subacute rehab.   Prostate cancer  - Castration resistant, metastatic to bone, s/p palliative RT  - Currently on olaparib under the care of Dr. Cathie Hoops    Type II DM  -A1c 7.1 this admission -Insulin Sliding Scale during Hospital Stay, He Will Be Resumed on His Home Dose Metformin at Time of Discharge   Chronic HFmrEF  - EF 45-50% on TTE from October 2022  - Appears compensated  - Continue Coreg and Aldactone, monitor volume status         Discharge Diagnoses:  Principal Problem:   Acute encephalopathy Active Problems:   Prostate cancer (HCC)   Cardiomyopathy (HCC)   Insulin dependent type 2 diabetes mellitus (HCC)   Stroke [facility])    Discharge Instructions  Discharge Instructions     Diet - low sodium heart healthy   Complete by: As directed    Discharge instructions   Complete by: As directed    Follow with [facility]   Get CBC, CMP,  by Primary MD next visit.    Activity: As tolerated with Full fall precautions use walker/cane & assistance as needed   Disposition Home   Diet: Regular DIet  On

## 2022-11-29 NOTE — Progress Notes (Signed)
STROKE TEAM PROGRESS NOTE   BRIEF HPI [name] is a 77 y.o. male with history of diabetes, hypertension, HFrEF (40 - 50%) metastatic prostate cancer to the bone, presented to the emergency department from [facility] after being found down after the alarm company called EMS for a fall. Fall occurred 9/20 in the afternoon or evening. Patient was found on the floor. He remembers the fall but does not remember if he lost consciousness or not.   SIGNIFICANT HOSPITAL EVENTS 9/21: Admission to the hospital.  CT: Cortical left frontal lobe infarct, new from April.  07/01/2022.  CTA head and neck: Calcified plaque in intracranial ICA's, severe stenosis on the left.  Right> left carotid calcifications at bifurcations.  A1c: 7.1, LDL: 66. 9/23: MRI negative for acute process.  Extensive microvascular ischemic change.  INTERIM HISTORY/SUBJECTIVE  His family member is at the bedside.  Patient appears to have improved cognitively today and is more alert and interactive and following commands.  Ammonia and B12, TSH and RPR were all normal.  Plan to discharge him today.  Vital signs stable. OBJECTIVE  CBC    Component Value Date/Time   WBC 3.0 (L) 11/29/2022 0529   RBC 3.21 (L) 11/29/2022 0529   HGB 10.2 (L) 11/29/2022 0529   HGB 10.2 (L) 10/25/2022 1029   HGB 13.2 03/14/2014 1557   HCT 32.2 (L) 11/29/2022 0529   HCT 41.8 03/14/2014 1557   PLT 175 11/29/2022 0529   PLT 192 10/25/2022 1029   PLT 219 03/14/2014 1557   MCV 100.3 (H) 11/29/2022 0529   MCV 95 03/14/2014 1557   MCH 31.8 11/29/2022 0529   MCHC 31.7 11/29/2022 0529   RDW 15.1 11/29/2022 0529   RDW 14.8 (H) 03/14/2014 1557   LYMPHSABS 0.4 (L) 11/26/2022 0932   LYMPHSABS 1.8 03/14/2014 1557   MONOABS 0.3 11/26/2022 0932   MONOABS 0.6 03/14/2014 1557   EOSABS 0.0 11/26/2022 0932   EOSABS 0.1 03/14/2014 1557   BASOSABS 0.0 11/26/2022 0932   BASOSABS 0.1 03/14/2014 1557    BMET    Component Value Date/Time   NA 137  11/29/2022 0529   NA 141 03/14/2014 1557   K 3.9 11/29/2022 0529   K 3.9 03/14/2014 1557   CL 104 11/29/2022 0529   CL 108 (H) 03/14/2014 1557   CO2 21 (L) 11/29/2022 0529   CO2 27 03/14/2014 1557   GLUCOSE 173 (H) 11/29/2022 0529   GLUCOSE 232 (H) 03/14/2014 1557   BUN 11 11/29/2022 0529   BUN 10 03/14/2014 1557   CREATININE 0.78 11/29/2022 0529   CREATININE 0.82 10/25/2022 1028   CREATININE 0.76 03/14/2014 1557   CALCIUM 8.9 11/29/2022 0529   CALCIUM 8.4 (L) 03/14/2014 1557   GFRNONAA >60 11/29/2022 0529   GFRNONAA >60 10/25/2022 1028   GFRNONAA >60 03/14/2014 1557    IMAGING past 24 hours No results found.  Vitals:   11/28/22 1940 11/28/22 2307 11/29/22 0312 11/29/22 0800  BP: 122/72 124/65 (!) 146/94 139/86  Pulse: 76 77 68 85  Resp: (!) 23 20 (!) 22 20  Temp: 97.9 F (36.6 C) 98 F (36.7 C) 98 F (36.7 C) 97.9 F (36.6 C)  TempSrc: Oral Oral Oral Oral  SpO2: 93% 94% 96% 94%    PHYSICAL EXAM General: No acute distress, sitting up in chair Psych:  Mood and affect appropriate for situation CV: Regular rate and rhythm on monitor Respiratory:  Regular, unlabored respirations on room air GI: Abdomen soft and nontender  NEURO:  Mental Status: Alert and oriented to self, and place but not date year or month. Speech/Language: No dysarthria, very slow duration and onset of speech, some stuttering.  Naming, repetition, and fluency intact.  Cranial Nerves:  II: Visual fields full.  III, IV, VI: EOMI. Eyelids elevate symmetrically.  V: Sensation is intact to light touch and symmetrical to face.  VII: Face is symmetrical resting and smiling VIII: hearing intact to voice. IX, X: Palate elevates symmetrically. Phonation is normal.  XB:JYNWGNFA shrug 5/5. XII: tongue is midline without fasciculations. Motor: Range of motion intact, slow Tone: is normal and bulk is normal Sensation- Intact to light touch bilaterally. 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 Stroke unlikely.  AMS likely secondary to fall.  Patient still has trouble explaining what happened.  Patient cognitive baseline is severely impaired -- and perhaps worsened since admission.  Family and room have concerns that he will not be able to return to living alone.  Social work is on Theatre manager.  Per OT note, patient was unaware of being incontinent of loose watery stool which, per sister, is off of his baseline. Ordered labs to investigate reversible causes of cognitive decline (B12, RPR, TSH) as well as MOCA.   Code Stroke CT head: Small recent appearing cortical infarct and brain MRI 07/01/2022. CTA head & neck: Calcified plaque in the intracranial ICA's resulting in severe stenosis of the cavernous segment on the left and no greater than mild stenosis on the right.  Otherwise, patient intracranial vasculature without other significant stenosis or occlusion.  Calcified plaque at the carotid bifurcations, right worse than left, without significant stenosis or occlusion of the internal carotid arteries.  Sclerotic lesion in the C3 vertebral body, T5 vertebral body, and right third rib likely reflect metastatic disease given the history of metastatic prostate cancer. EEG- This study is suggestive of mild diffuse encephalopathy. No seizures or epileptiform discharges were seen throughout the recording  MRI: No evidence of acute intracranial abnormality or metastatic disease.  Advanced chronic microvascular ischemic change. 2D Echo with enhancement LVEF: 40 to 45%.  Left atrium size within normal limits. LDL: 66 HgbA1c 7.1 VTE prophylaxis -subcu Lovenox 40 Aspirin 81 mg daily at home, continuing aspirin 81 mg.   Therapy recommendations: PT: Post acute inpatient setting.  OT: Continued inpatient Disposition: Pending  further workup  Hypertension Home meds: Coreg 3.25 twice daily Stable  Diabetes type II Uncontrolled HgbA1c 7.1, goal < 7.0 CBGs Very sensitive SSI Recommend close follow-up with PCP for better DM control  CHF (45-50%) Agree with continue Coreg and Aldactone Consider repeat TTE, last 10/22  Other Stroke Risk Factors Advanced age Hypertension Coronary artery disease Tobacco use history  Hospital day # 1  Patient was admitted following an unexplained fall at home with some altered mental status which appears to be improving.  Suspect lives alone and has some mild cognitive impairment at baseline.  Neuroimaging negative for acute stroke and EEG shows no definite seizure activity.  Patient seems to be improving but is not back to his cognitive baseline as per family.  Long discussion at bedside with the patient and sister and answered questions.  Discussed with Dr. Randol Kern.  Greater than 50% time during this 35-minute visit was spent in counseling and coordination of care and discussion with patient and family and care  team and answering questions  Matthew Heady, MD Medical Director [facility] Pager: [phone] 11/29/2022 2:48 PM   To contact Stroke Continuity provider, please refer to [email or web address]. After hours, contact General Neurology

## 2022-11-29 NOTE — TOC Transition Note (Addendum)
Transition of Care [facility]) - CM/SW Discharge Note   Patient Details  Name: Matthew Mcgrath MRN: [id number] Date of Birth: 03-Jul-1945  [facility] CM/SW Contact:  Mearl Latin, LCSW Phone Number: 11/29/2022, 11:59 AM   Clinical Narrative:    Patient will DC to: [facility] [place] Anticipated DC date: 11/29/22 Family notified: Sister, [name] (she has his chemo meds) Transport by: [facility]   Per MD patient ready for DC to [facility]. RN to call report prior to discharge [phone] room 109A). RN, patient, patient's family, and facility notified of DC. Discharge Summary and FL2 sent to facility.    CSW will sign off for now as social work intervention is no longer needed. Please consult [place] again if new needs arise.     Final next level of care: Skilled Nursing Facility Barriers to Discharge: Barriers Resolved   Patient Goals and CMS Choice CMS Medicare.gov Compare Post Acute Care list provided to:: Patient Represenative (must comment) Choice offered to / list presented to : Sibling  Discharge Placement     Existing PASRR number confirmed : 11/29/22          Patient chooses bed at: [facility] [place] Patient to be transferred to facility by: [facility] Name of family member notified: Sister Patient and family notified of of transfer: 11/29/22  Discharge Plan and Services Additional resources added to the After Visit Summary for   In-house Referral: Clinical Social Work                                   Social Determinants of Health (SDOH) Interventions SDOH Screenings   Food Insecurity: No Food Insecurity (11/26/2022)  Housing: Low Risk  (11/26/2022)  Transportation Needs: No Transportation Needs (11/26/2022)  Utilities: Not At Risk (11/26/2022)  Tobacco Use: Medium Risk (11/26/2022)     Readmission Risk Interventions    06/21/2021   11:22 AM  Readmission Risk Prevention Plan  Transportation Screening Complete  PCP or Specialist Appt  within 5-7 Days Complete  Home Care Screening Complete  Medication Review (RN CM) Complete

## 2022-12-05 ENCOUNTER — Inpatient Hospital Stay: Payer: Medicare (Managed Care) | Attending: Oncology

## 2022-12-05 DIAGNOSIS — C61 Malignant neoplasm of prostate: Secondary | ICD-10-CM | POA: Diagnosis present

## 2022-12-05 DIAGNOSIS — C7951 Secondary malignant neoplasm of bone: Secondary | ICD-10-CM | POA: Diagnosis not present

## 2022-12-05 DIAGNOSIS — D649 Anemia, unspecified: Secondary | ICD-10-CM | POA: Insufficient documentation

## 2022-12-05 LAB — RETIC PANEL
Immature Retic Fract: 28.6 % — ABNORMAL HIGH (ref 2.3–15.9)
RBC.: 3.09 MIL/uL — ABNORMAL LOW (ref 4.22–5.81)
Retic Count, Absolute: 68.3 10*3/uL (ref 19.0–186.0)
Retic Ct Pct: 2.2 % (ref 0.4–3.1)
Reticulocyte Hemoglobin: 32.5 pg (ref >27.9)

## 2022-12-05 LAB — CBC WITH DIFFERENTIAL (CANCER CENTER ONLY)
Abs Immature Granulocytes: 0.01 10*3/uL (ref 0.00–0.07)
Basophils Absolute: 0 10*3/uL (ref 0.0–0.1)
Basophils Relative: 0 %
Eosinophils Absolute: 0 10*3/uL (ref 0.0–0.5)
Eosinophils Relative: 1 %
HCT: 32.1 % — ABNORMAL LOW (ref 39.0–52.0)
Hemoglobin: 10.1 g/dL — ABNORMAL LOW (ref 13.0–17.0)
Immature Granulocytes: 0 %
Lymphocytes Relative: 21 %
Lymphs Abs: 0.6 10*3/uL — ABNORMAL LOW (ref 0.7–4.0)
MCH: 32.6 pg (ref 26.0–34.0)
MCHC: 31.5 g/dL (ref 30.0–36.0)
MCV: 103.5 fL — ABNORMAL HIGH (ref 80.0–100.0)
Monocytes Absolute: 0.3 10*3/uL (ref 0.1–1.0)
Monocytes Relative: 10 %
Neutro Abs: 2 10*3/uL (ref 1.7–7.7)
Neutrophils Relative %: 68 %
Platelet Count: 189 10*3/uL (ref 150–400)
RBC: 3.1 MIL/uL — ABNORMAL LOW (ref 4.22–5.81)
RDW: 15.2 % (ref 11.5–15.5)
WBC Count: 3 10*3/uL — ABNORMAL LOW (ref 4.0–10.5)
nRBC: 0 % (ref 0.0–0.2)

## 2022-12-05 LAB — CMP (CANCER CENTER ONLY)
ALT: 14 U/L (ref 0–44)
AST: 17 U/L (ref 15–41)
Albumin: 3.6 g/dL (ref 3.5–5.0)
Alkaline Phosphatase: 64 U/L (ref 38–126)
Anion gap: 8 (ref 5–15)
BUN: 15 mg/dL (ref 8–23)
CO2: 24 mmol/L (ref 22–32)
Calcium: 9.1 mg/dL (ref 8.9–10.3)
Chloride: 105 mmol/L (ref 98–111)
Creatinine: 0.8 mg/dL (ref 0.61–1.24)
GFR, Estimated: >60 mL/min (ref >60)
Glucose, Bld: 229 mg/dL — ABNORMAL HIGH (ref 70–99)
Potassium: 3.9 mmol/L (ref 3.5–5.1)
Sodium: 137 mmol/L (ref 135–145)
Total Bilirubin: 0.3 mg/dL (ref 0.3–1.2)
Total Protein: 6.9 g/dL (ref 6.5–8.1)

## 2022-12-05 LAB — PSA: Prostatic Specific Antigen: 0.17 ng/mL (ref 0.00–4.00)

## 2022-12-05 LAB — FOLATE: Folate: 12 ng/mL (ref >5.9)

## 2022-12-05 LAB — IRON AND TIBC
Iron: 80 ug/dL (ref 45–182)
Saturation Ratios: 23 % (ref 17.9–39.5)
TIBC: 347 ug/dL (ref 250–450)
UIBC: 267 ug/dL

## 2022-12-05 LAB — FERRITIN: Ferritin: 57 ng/mL (ref 24–336)

## 2022-12-05 LAB — VITAMIN B12: Vitamin B-12: 431 pg/mL (ref 180–914)

## 2022-12-06 ENCOUNTER — Other Ambulatory Visit: Payer: Medicare (Managed Care)

## 2022-12-07 ENCOUNTER — Inpatient Hospital Stay: Payer: Medicare (Managed Care) | Attending: Oncology | Admitting: Oncology

## 2022-12-07 ENCOUNTER — Encounter: Payer: Self-pay | Admitting: Oncology

## 2022-12-07 VITALS — BP 132/86 | HR 85 | Temp 97.0°F | Resp 18 | Wt 187.2 lb

## 2022-12-07 DIAGNOSIS — C61 Malignant neoplasm of prostate: Secondary | ICD-10-CM | POA: Diagnosis not present

## 2022-12-07 DIAGNOSIS — Z803 Family history of malignant neoplasm of breast: Secondary | ICD-10-CM | POA: Insufficient documentation

## 2022-12-07 DIAGNOSIS — C7951 Secondary malignant neoplasm of bone: Secondary | ICD-10-CM | POA: Diagnosis not present

## 2022-12-07 DIAGNOSIS — Z87891 Personal history of nicotine dependence: Secondary | ICD-10-CM | POA: Diagnosis not present

## 2022-12-07 DIAGNOSIS — Z800 Family history of malignant neoplasm of digestive organs: Secondary | ICD-10-CM | POA: Insufficient documentation

## 2022-12-07 DIAGNOSIS — Z807 Family history of other malignant neoplasms of lymphoid, hematopoietic and related tissues: Secondary | ICD-10-CM | POA: Insufficient documentation

## 2022-12-07 DIAGNOSIS — D649 Anemia, unspecified: Secondary | ICD-10-CM | POA: Insufficient documentation

## 2022-12-07 DIAGNOSIS — Z5111 Encounter for antineoplastic chemotherapy: Secondary | ICD-10-CM | POA: Diagnosis not present

## 2022-12-07 NOTE — Assessment & Plan Note (Addendum)
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   PSA nadir 0.01. PMSA showed new bone lesions, s/p palliative radiation.  PSA 3.29 --> 4.72-->0.9 -->0.16-->0.17 Labs are reviewed and discussed with patient. Eligard 45 mg Q6 months with his PCP at [facility]- Continue Olarparib 250mg  BID [ dose reduced due to performance status].  Patient will get medication through [facility].

## 2022-12-07 NOTE — Assessment & Plan Note (Signed)
Slight decreased hemoglobin.  Likely due to marrow side effects due to Olarparib.  Normal  iron tibc ferritin, folate, b12

## 2022-12-07 NOTE — Assessment & Plan Note (Signed)
S/p palliative RT Sister and patient previously declined.

## 2022-12-07 NOTE — Progress Notes (Signed)
vessel ischemic gliosis in the cerebral white matter. There is an infarct in the lateral left frontal cortex which appears recent and is new from 07/01/2022. Vascular: No hyperdense vessel or unexpected calcification. Skull: Normal. Negative for fracture or focal lesion. Sinuses/Orbits: No acute finding. CT CERVICAL SPINE FINDINGS Alignment: Normal. Skull base and vertebrae:  No acute fracture. Sclerotic lesion in the left half of the C3 body, known metastatic prostate cancer. Soft tissues and spinal canal: No acute finding Disc levels:  Mild for age degenerative endplate spurring. Upper chest: Negative IMPRESSION: 1. 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  Known osseous metastatic disease. Electronically Signed   By: Tiburcio Pea M.D.   On: 11/26/2022 10:12   DG Chest Port 1 View  Result Date: 11/26/2022 CLINICAL DATA:  77 year old male with history of trauma from a fall. Sepsis. EXAM: PORTABLE CHEST 1 VIEW COMPARISON:  Chest x-ray 06/18/2021. FINDINGS: Low lung volumes. Linear areas of architectural distortion in the left lower lung, similar to prior study from 06/18/2021, presumably chronic scarring. No acute consolidative airspace disease. No pleural effusions. No pneumothorax. No evidence of pulmonary edema. Heart size is borderline enlarged. The patient is rotated to the left on today's exam, resulting in distortion of the mediastinal contours and reduced diagnostic sensitivity and specificity for mediastinal pathology. Atherosclerotic calcifications are noted in the thoracic aorta. Left-sided pacemaker/AICD with lead tip projecting over the expected location of the right ventricular apex. IMPRESSION: 1. Low lung volumes without radiographic evidence of acute cardiopulmonary disease. 2. Aortic atherosclerosis. Electronically Signed   By: Trudie Reed M.D.   On: 11/26/2022 09:46   DG BONE DENSITY (DXA)  Result Date: 10/28/2022 EXAM: DUAL X-RAY ABSORPTIOMETRY (DXA) FOR BONE MINERAL DENSITY IMPRESSION: Your patient Matthew Mcgrath completed a BMD test on 10/28/2022 using the [employer] iDXA DXA System (software version: 14.10) manufactured by [employer]. The following summarizes the results of our evaluation. Technologist: PATIENT BIOGRAPHICAL: Name: Matthew Mcgrath, Matthew Mcgrath Patient ID: [id number] Birth Date: 02/11/1946 Height: 69.0 in. Gender:  Male Exam Date: 10/28/2022 Weight: 189.1 lbs. Indications: Prostate Cancer Fractures:             Treatments: DENSITOMETRY RESULTS: Site         Region     Measured Date Measured Age WHO Classification Young Adult T-score BMD         %Change vs. Previous Significant Change (*) DualFemur Neck Right 10/28/2022 [age] Osteoporosis -3.2 0.659 g/cm2 - - DualFemur Total Mean 10/28/2022 77.0 Osteopenia -2.3 0.767 g/cm2 - - Left Forearm Radius 33% 10/28/2022 77.0 Osteopenia -1.2 0.869 g/cm2 - - ASSESSMENT: The BMD measured at Femur Neck Right is 0.659 g/cm2 with a T-score of -3.2 is markedly low. This patient is considered osteoporotic by [facility]) Criteria. The scan quality is good. Lumbar spine was not utilized due to advanced degenerative changes. World Science writer [facility]) criteria for post-menopausal, Caucasian Women: Normal:                   T-score at or above -1 SD Osteopenia/low bone mass: T-score between -1 and -2.5 SD Osteoporosis:             T-score at or below -2.5 SD RECOMMENDATIONS: 1. All patients should optimize calcium and vitamin D intake. 2. Consider FDA-approved medical therapies in postmenopausal women and men aged 78 years and older, based on the following: a. A hip or vertebral(clinical or morphometric) fracture b. T-score < -2.5 at the femoral neck  vessel ischemic gliosis in the cerebral white matter. There is an infarct in the lateral left frontal cortex which appears recent and is new from 07/01/2022. Vascular: No hyperdense vessel or unexpected calcification. Skull: Normal. Negative for fracture or focal lesion. Sinuses/Orbits: No acute finding. CT CERVICAL SPINE FINDINGS Alignment: Normal. Skull base and vertebrae:  No acute fracture. Sclerotic lesion in the left half of the C3 body, known metastatic prostate cancer. Soft tissues and spinal canal: No acute finding Disc levels:  Mild for age degenerative endplate spurring. Upper chest: Negative IMPRESSION: 1. 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  Known osseous metastatic disease. Electronically Signed   By: Tiburcio Pea M.D.   On: 11/26/2022 10:12   DG Chest Port 1 View  Result Date: 11/26/2022 CLINICAL DATA:  77 year old male with history of trauma from a fall. Sepsis. EXAM: PORTABLE CHEST 1 VIEW COMPARISON:  Chest x-ray 06/18/2021. FINDINGS: Low lung volumes. Linear areas of architectural distortion in the left lower lung, similar to prior study from 06/18/2021, presumably chronic scarring. No acute consolidative airspace disease. No pleural effusions. No pneumothorax. No evidence of pulmonary edema. Heart size is borderline enlarged. The patient is rotated to the left on today's exam, resulting in distortion of the mediastinal contours and reduced diagnostic sensitivity and specificity for mediastinal pathology. Atherosclerotic calcifications are noted in the thoracic aorta. Left-sided pacemaker/AICD with lead tip projecting over the expected location of the right ventricular apex. IMPRESSION: 1. Low lung volumes without radiographic evidence of acute cardiopulmonary disease. 2. Aortic atherosclerosis. Electronically Signed   By: Trudie Reed M.D.   On: 11/26/2022 09:46   DG BONE DENSITY (DXA)  Result Date: 10/28/2022 EXAM: DUAL X-RAY ABSORPTIOMETRY (DXA) FOR BONE MINERAL DENSITY IMPRESSION: Your patient Matthew Mcgrath completed a BMD test on 10/28/2022 using the [employer] iDXA DXA System (software version: 14.10) manufactured by [employer]. The following summarizes the results of our evaluation. Technologist: PATIENT BIOGRAPHICAL: Name: Matthew Mcgrath, Matthew Mcgrath Patient ID: [id number] Birth Date: 02/11/1946 Height: 69.0 in. Gender:  Male Exam Date: 10/28/2022 Weight: 189.1 lbs. Indications: Prostate Cancer Fractures:             Treatments: DENSITOMETRY RESULTS: Site         Region     Measured Date Measured Age WHO Classification Young Adult T-score BMD         %Change vs. Previous Significant Change (*) DualFemur Neck Right 10/28/2022 [age] Osteoporosis -3.2 0.659 g/cm2 - - DualFemur Total Mean 10/28/2022 75.0 Osteopenia -2.3 0.767 g/cm2 - - Left Forearm Radius 33% 10/28/2022 77.0 Osteopenia -1.2 0.869 g/cm2 - - ASSESSMENT: The BMD measured at Femur Neck Right is 0.659 g/cm2 with a T-score of -3.2 is markedly low. This patient is considered osteoporotic by [facility]) Criteria. The scan quality is good. Lumbar spine was not utilized due to advanced degenerative changes. World Science writer [facility]) criteria for post-menopausal, Caucasian Women: Normal:                   T-score at or above -1 SD Osteopenia/low bone mass: T-score between -1 and -2.5 SD Osteoporosis:             T-score at or below -2.5 SD RECOMMENDATIONS: 1. All patients should optimize calcium and vitamin D intake. 2. Consider FDA-approved medical therapies in postmenopausal women and men aged 78 years and older, based on the following: a. A hip or vertebral(clinical or morphometric) fracture b. T-score < -2.5 at the femoral neck  52.0 % 32.1  32.2  31.1   Platelets 150 - 400 K/uL 189  175  173       Latest Ref Rng & Units 12/05/2022   10:39 AM 11/29/2022    5:29 AM 11/28/2022    5:18 AM  CMP  Glucose 70 - 99 mg/dL 161  096  045   BUN 8 - 23 mg/dL 15  11  13    Creatinine 0.61 - 1.24 mg/dL 4.09  8.11  9.14   Sodium 135 - 145 mmol/L 137  137  135   Potassium 3.5 - 5.1 mmol/L 3.9  3.9  3.5   Chloride 98 - 111  mmol/L 105  104  105   CO2 22 - 32 mmol/L 24  21  22    Calcium 8.9 - 10.3 mg/dL 9.1  8.9  8.7   Total Protein 6.5 - 8.1 g/dL 6.9     Total Bilirubin 0.3 - 1.2 mg/dL 0.3     Alkaline Phos 38 - 126 U/L 64     AST 15 - 41 U/L 17     ALT 0 - 44 U/L 14       Iron/TIBC/Ferritin/ %Sat    Component Value Date/Time   IRON 80 12/05/2022 1039   TIBC 347 12/05/2022 1039   FERRITIN 57 12/05/2022 1039   IRONPCTSAT 23 12/05/2022 1039     RADIOGRAPHIC STUDIES: I have personally reviewed the radiological images as listed and agreed with the findings in the report. MR BRAIN W WO CONTRAST  Result Date: 11/28/2022 CLINICAL DATA:  Stroke, follow up Metastatic disease evaluation EXAM: MRI HEAD WITHOUT AND WITH CONTRAST TECHNIQUE: Multiplanar, multiecho pulse sequences of the brain and surrounding structures were obtained without and with intravenous contrast. CONTRAST:  8mL GADAVIST GADOBUTROL 1 MMOL/ML IV SOLN COMPARISON:  CT head 11/26/2022. FINDINGS: Brain: No acute infarction, hemorrhage, hydrocephalus, extra-axial collection or mass lesion. Advanced confluent and patchy T2/FLAIR hyperintensities in the white matter, nonspecific but compatible with chronic microvascular ischemic disease. 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 No pathologic enhancement. Vascular: Major arterial flow voids are maintained at the skull base. Skull and upper cervical spine: Normal marrow signal. Sinuses/Orbits: Clear sinuses.  No acute orbital findings. Other: No meso his free IMPRESSION: 1. No evidence of acute intracranial abnormality or metastatic disease. 2. Advanced chronic microvascular ischemic change. Electronically Signed   By: Feliberto Harts M.D.   On: 11/28/2022 13:32   ECHOCARDIOGRAM COMPLETE  Result Date: 11/27/2022    ECHOCARDIOGRAM REPORT   Patient Name:   Matthew Mcgrath Date of Exam: 11/27/2022 Medical Rec #:  [id number]      Height:       69.0 in Accession #:    [id number]     Weight:       193.8 lb Date  of Birth:  1945-12-15      BSA:          2.038 m Patient Age:    77 years       BP:           117/78 mmHg Patient Gender: M              HR:           81 bpm. Exam Location:  Inpatient Procedure: 2D Echo, Cardiac Doppler and Color Doppler Indications:    stroke  History:        Patient has prior history of Echocardiogram examinations, most  52.0 % 32.1  32.2  31.1   Platelets 150 - 400 K/uL 189  175  173       Latest Ref Rng & Units 12/05/2022   10:39 AM 11/29/2022    5:29 AM 11/28/2022    5:18 AM  CMP  Glucose 70 - 99 mg/dL 161  096  045   BUN 8 - 23 mg/dL 15  11  13    Creatinine 0.61 - 1.24 mg/dL 4.09  8.11  9.14   Sodium 135 - 145 mmol/L 137  137  135   Potassium 3.5 - 5.1 mmol/L 3.9  3.9  3.5   Chloride 98 - 111  mmol/L 105  104  105   CO2 22 - 32 mmol/L 24  21  22    Calcium 8.9 - 10.3 mg/dL 9.1  8.9  8.7   Total Protein 6.5 - 8.1 g/dL 6.9     Total Bilirubin 0.3 - 1.2 mg/dL 0.3     Alkaline Phos 38 - 126 U/L 64     AST 15 - 41 U/L 17     ALT 0 - 44 U/L 14       Iron/TIBC/Ferritin/ %Sat    Component Value Date/Time   IRON 80 12/05/2022 1039   TIBC 347 12/05/2022 1039   FERRITIN 57 12/05/2022 1039   IRONPCTSAT 23 12/05/2022 1039     RADIOGRAPHIC STUDIES: I have personally reviewed the radiological images as listed and agreed with the findings in the report. MR BRAIN W WO CONTRAST  Result Date: 11/28/2022 CLINICAL DATA:  Stroke, follow up Metastatic disease evaluation EXAM: MRI HEAD WITHOUT AND WITH CONTRAST TECHNIQUE: Multiplanar, multiecho pulse sequences of the brain and surrounding structures were obtained without and with intravenous contrast. CONTRAST:  8mL GADAVIST GADOBUTROL 1 MMOL/ML IV SOLN COMPARISON:  CT head 11/26/2022. FINDINGS: Brain: No acute infarction, hemorrhage, hydrocephalus, extra-axial collection or mass lesion. Advanced confluent and patchy T2/FLAIR hyperintensities in the white matter, nonspecific but compatible with chronic microvascular ischemic disease. 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 No pathologic enhancement. Vascular: Major arterial flow voids are maintained at the skull base. Skull and upper cervical spine: Normal marrow signal. Sinuses/Orbits: Clear sinuses.  No acute orbital findings. Other: No meso his free IMPRESSION: 1. No evidence of acute intracranial abnormality or metastatic disease. 2. Advanced chronic microvascular ischemic change. Electronically Signed   By: Feliberto Harts M.D.   On: 11/28/2022 13:32   ECHOCARDIOGRAM COMPLETE  Result Date: 11/27/2022    ECHOCARDIOGRAM REPORT   Patient Name:   Matthew Mcgrath Date of Exam: 11/27/2022 Medical Rec #:  [id number]      Height:       69.0 in Accession #:    [id number]     Weight:       193.8 lb Date  of Birth:  1945-12-15      BSA:          2.038 m Patient Age:    77 years       BP:           117/78 mmHg Patient Gender: M              HR:           81 bpm. Exam Location:  Inpatient Procedure: 2D Echo, Cardiac Doppler and Color Doppler Indications:    stroke  History:        Patient has prior history of Echocardiogram examinations, most  52.0 % 32.1  32.2  31.1   Platelets 150 - 400 K/uL 189  175  173       Latest Ref Rng & Units 12/05/2022   10:39 AM 11/29/2022    5:29 AM 11/28/2022    5:18 AM  CMP  Glucose 70 - 99 mg/dL 161  096  045   BUN 8 - 23 mg/dL 15  11  13    Creatinine 0.61 - 1.24 mg/dL 4.09  8.11  9.14   Sodium 135 - 145 mmol/L 137  137  135   Potassium 3.5 - 5.1 mmol/L 3.9  3.9  3.5   Chloride 98 - 111  mmol/L 105  104  105   CO2 22 - 32 mmol/L 24  21  22    Calcium 8.9 - 10.3 mg/dL 9.1  8.9  8.7   Total Protein 6.5 - 8.1 g/dL 6.9     Total Bilirubin 0.3 - 1.2 mg/dL 0.3     Alkaline Phos 38 - 126 U/L 64     AST 15 - 41 U/L 17     ALT 0 - 44 U/L 14       Iron/TIBC/Ferritin/ %Sat    Component Value Date/Time   IRON 80 12/05/2022 1039   TIBC 347 12/05/2022 1039   FERRITIN 57 12/05/2022 1039   IRONPCTSAT 23 12/05/2022 1039     RADIOGRAPHIC STUDIES: I have personally reviewed the radiological images as listed and agreed with the findings in the report. MR BRAIN W WO CONTRAST  Result Date: 11/28/2022 CLINICAL DATA:  Stroke, follow up Metastatic disease evaluation EXAM: MRI HEAD WITHOUT AND WITH CONTRAST TECHNIQUE: Multiplanar, multiecho pulse sequences of the brain and surrounding structures were obtained without and with intravenous contrast. CONTRAST:  8mL GADAVIST GADOBUTROL 1 MMOL/ML IV SOLN COMPARISON:  CT head 11/26/2022. FINDINGS: Brain: No acute infarction, hemorrhage, hydrocephalus, extra-axial collection or mass lesion. Advanced confluent and patchy T2/FLAIR hyperintensities in the white matter, nonspecific but compatible with chronic microvascular ischemic disease. 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 No pathologic enhancement. Vascular: Major arterial flow voids are maintained at the skull base. Skull and upper cervical spine: Normal marrow signal. Sinuses/Orbits: Clear sinuses.  No acute orbital findings. Other: No meso his free IMPRESSION: 1. No evidence of acute intracranial abnormality or metastatic disease. 2. Advanced chronic microvascular ischemic change. Electronically Signed   By: Feliberto Harts M.D.   On: 11/28/2022 13:32   ECHOCARDIOGRAM COMPLETE  Result Date: 11/27/2022    ECHOCARDIOGRAM REPORT   Patient Name:   Matthew Mcgrath Date of Exam: 11/27/2022 Medical Rec #:  [id number]      Height:       69.0 in Accession #:    [id number]     Weight:       193.8 lb Date  of Birth:  1945-12-15      BSA:          2.038 m Patient Age:    77 years       BP:           117/78 mmHg Patient Gender: M              HR:           81 bpm. Exam Location:  Inpatient Procedure: 2D Echo, Cardiac Doppler and Color Doppler Indications:    stroke  History:        Patient has prior history of Echocardiogram examinations, most  vessel ischemic gliosis in the cerebral white matter. There is an infarct in the lateral left frontal cortex which appears recent and is new from 07/01/2022. Vascular: No hyperdense vessel or unexpected calcification. Skull: Normal. Negative for fracture or focal lesion. Sinuses/Orbits: No acute finding. CT CERVICAL SPINE FINDINGS Alignment: Normal. Skull base and vertebrae:  No acute fracture. Sclerotic lesion in the left half of the C3 body, known metastatic prostate cancer. Soft tissues and spinal canal: No acute finding Disc levels:  Mild for age degenerative endplate spurring. Upper chest: Negative IMPRESSION: 1. 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  Known osseous metastatic disease. Electronically Signed   By: Tiburcio Pea M.D.   On: 11/26/2022 10:12   DG Chest Port 1 View  Result Date: 11/26/2022 CLINICAL DATA:  77 year old male with history of trauma from a fall. Sepsis. EXAM: PORTABLE CHEST 1 VIEW COMPARISON:  Chest x-ray 06/18/2021. FINDINGS: Low lung volumes. Linear areas of architectural distortion in the left lower lung, similar to prior study from 06/18/2021, presumably chronic scarring. No acute consolidative airspace disease. No pleural effusions. No pneumothorax. No evidence of pulmonary edema. Heart size is borderline enlarged. The patient is rotated to the left on today's exam, resulting in distortion of the mediastinal contours and reduced diagnostic sensitivity and specificity for mediastinal pathology. Atherosclerotic calcifications are noted in the thoracic aorta. Left-sided pacemaker/AICD with lead tip projecting over the expected location of the right ventricular apex. IMPRESSION: 1. Low lung volumes without radiographic evidence of acute cardiopulmonary disease. 2. Aortic atherosclerosis. Electronically Signed   By: Trudie Reed M.D.   On: 11/26/2022 09:46   DG BONE DENSITY (DXA)  Result Date: 10/28/2022 EXAM: DUAL X-RAY ABSORPTIOMETRY (DXA) FOR BONE MINERAL DENSITY IMPRESSION: Your patient Matthew Mcgrath completed a BMD test on 10/28/2022 using the [employer] iDXA DXA System (software version: 14.10) manufactured by [employer]. The following summarizes the results of our evaluation. Technologist: PATIENT BIOGRAPHICAL: Name: Matthew Mcgrath, Matthew Mcgrath Patient ID: [id number] Birth Date: 02/11/1946 Height: 69.0 in. Gender:  Male Exam Date: 10/28/2022 Weight: 189.1 lbs. Indications: Prostate Cancer Fractures:             Treatments: DENSITOMETRY RESULTS: Site         Region     Measured Date Measured Age WHO Classification Young Adult T-score BMD         %Change vs. Previous Significant Change (*) DualFemur Neck Right 10/28/2022 77.0 Osteoporosis -3.2 0.659 g/cm2 - - DualFemur Total Mean 10/28/2022 75.0 Osteopenia -2.3 0.767 g/cm2 - - Left Forearm Radius 33% 10/28/2022 75.0 Osteopenia -1.2 0.869 g/cm2 - - ASSESSMENT: The BMD measured at Femur Neck Right is 0.659 g/cm2 with a T-score of -3.2 is markedly low. This patient is considered osteoporotic by [facility]) Criteria. The scan quality is good. Lumbar spine was not utilized due to advanced degenerative changes. World Science writer [facility]) criteria for post-menopausal, Caucasian Women: Normal:                   T-score at or above -1 SD Osteopenia/low bone mass: T-score between -1 and -2.5 SD Osteoporosis:             T-score at or below -2.5 SD RECOMMENDATIONS: 1. All patients should optimize calcium and vitamin D intake. 2. Consider FDA-approved medical therapies in postmenopausal women and men aged 78 years and older, based on the following: a. A hip or vertebral(clinical or morphometric) fracture b. T-score < -2.5 at the femoral neck  52.0 % 32.1  32.2  31.1   Platelets 150 - 400 K/uL 189  175  173       Latest Ref Rng & Units 12/05/2022   10:39 AM 11/29/2022    5:29 AM 11/28/2022    5:18 AM  CMP  Glucose 70 - 99 mg/dL 161  096  045   BUN 8 - 23 mg/dL 15  11  13    Creatinine 0.61 - 1.24 mg/dL 4.09  8.11  9.14   Sodium 135 - 145 mmol/L 137  137  135   Potassium 3.5 - 5.1 mmol/L 3.9  3.9  3.5   Chloride 98 - 111  mmol/L 105  104  105   CO2 22 - 32 mmol/L 24  21  22    Calcium 8.9 - 10.3 mg/dL 9.1  8.9  8.7   Total Protein 6.5 - 8.1 g/dL 6.9     Total Bilirubin 0.3 - 1.2 mg/dL 0.3     Alkaline Phos 38 - 126 U/L 64     AST 15 - 41 U/L 17     ALT 0 - 44 U/L 14       Iron/TIBC/Ferritin/ %Sat    Component Value Date/Time   IRON 80 12/05/2022 1039   TIBC 347 12/05/2022 1039   FERRITIN 57 12/05/2022 1039   IRONPCTSAT 23 12/05/2022 1039     RADIOGRAPHIC STUDIES: I have personally reviewed the radiological images as listed and agreed with the findings in the report. MR BRAIN W WO CONTRAST  Result Date: 11/28/2022 CLINICAL DATA:  Stroke, follow up Metastatic disease evaluation EXAM: MRI HEAD WITHOUT AND WITH CONTRAST TECHNIQUE: Multiplanar, multiecho pulse sequences of the brain and surrounding structures were obtained without and with intravenous contrast. CONTRAST:  8mL GADAVIST GADOBUTROL 1 MMOL/ML IV SOLN COMPARISON:  CT head 11/26/2022. FINDINGS: Brain: No acute infarction, hemorrhage, hydrocephalus, extra-axial collection or mass lesion. Advanced confluent and patchy T2/FLAIR hyperintensities in the white matter, nonspecific but compatible with chronic microvascular ischemic disease. 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 No pathologic enhancement. Vascular: Major arterial flow voids are maintained at the skull base. Skull and upper cervical spine: Normal marrow signal. Sinuses/Orbits: Clear sinuses.  No acute orbital findings. Other: No meso his free IMPRESSION: 1. No evidence of acute intracranial abnormality or metastatic disease. 2. Advanced chronic microvascular ischemic change. Electronically Signed   By: Feliberto Harts M.D.   On: 11/28/2022 13:32   ECHOCARDIOGRAM COMPLETE  Result Date: 11/27/2022    ECHOCARDIOGRAM REPORT   Patient Name:   Matthew Mcgrath Date of Exam: 11/27/2022 Medical Rec #:  [id number]      Height:       69.0 in Accession #:    [id number]     Weight:       193.8 lb Date  of Birth:  1945-12-15      BSA:          2.038 m Patient Age:    77 years       BP:           117/78 mmHg Patient Gender: M              HR:           81 bpm. Exam Location:  Inpatient Procedure: 2D Echo, Cardiac Doppler and Color Doppler Indications:    stroke  History:        Patient has prior history of Echocardiogram examinations, most  vessel ischemic gliosis in the cerebral white matter. There is an infarct in the lateral left frontal cortex which appears recent and is new from 07/01/2022. Vascular: No hyperdense vessel or unexpected calcification. Skull: Normal. Negative for fracture or focal lesion. Sinuses/Orbits: No acute finding. CT CERVICAL SPINE FINDINGS Alignment: Normal. Skull base and vertebrae:  No acute fracture. Sclerotic lesion in the left half of the C3 body, known metastatic prostate cancer. Soft tissues and spinal canal: No acute finding Disc levels:  Mild for age degenerative endplate spurring. Upper chest: Negative IMPRESSION: 1. 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  Known osseous metastatic disease. Electronically Signed   By: Tiburcio Pea M.D.   On: 11/26/2022 10:12   DG Chest Port 1 View  Result Date: 11/26/2022 CLINICAL DATA:  77 year old male with history of trauma from a fall. Sepsis. EXAM: PORTABLE CHEST 1 VIEW COMPARISON:  Chest x-ray 06/18/2021. FINDINGS: Low lung volumes. Linear areas of architectural distortion in the left lower lung, similar to prior study from 06/18/2021, presumably chronic scarring. No acute consolidative airspace disease. No pleural effusions. No pneumothorax. No evidence of pulmonary edema. Heart size is borderline enlarged. The patient is rotated to the left on today's exam, resulting in distortion of the mediastinal contours and reduced diagnostic sensitivity and specificity for mediastinal pathology. Atherosclerotic calcifications are noted in the thoracic aorta. Left-sided pacemaker/AICD with lead tip projecting over the expected location of the right ventricular apex. IMPRESSION: 1. Low lung volumes without radiographic evidence of acute cardiopulmonary disease. 2. Aortic atherosclerosis. Electronically Signed   By: Trudie Reed M.D.   On: 11/26/2022 09:46   DG BONE DENSITY (DXA)  Result Date: 10/28/2022 EXAM: DUAL X-RAY ABSORPTIOMETRY (DXA) FOR BONE MINERAL DENSITY IMPRESSION: Your patient Matthew Mcgrath completed a BMD test on 10/28/2022 using the [employer] iDXA DXA System (software version: 14.10) manufactured by [employer]. The following summarizes the results of our evaluation. Technologist: PATIENT BIOGRAPHICAL: Name: Matthew Mcgrath, Matthew Mcgrath Patient ID: [id number] Birth Date: 02/11/1946 Height: 69.0 in. Gender:  Male Exam Date: 10/28/2022 Weight: 189.1 lbs. Indications: Prostate Cancer Fractures:             Treatments: DENSITOMETRY RESULTS: Site         Region     Measured Date Measured Age WHO Classification Young Adult T-score BMD         %Change vs. Previous Significant Change (*) DualFemur Neck Right 10/28/20[age] Osteoporosis -3.2 0.659 g/cm2 - - DualFemur Total Mean 10/28/2022 77.0 Osteopenia -2.3 0.767 g/cm2 - - Left Forearm Radius 33% 10/28/2022 77.0 Osteopenia -1.2 0.869 g/cm2 - - ASSESSMENT: The BMD measured at Femur Neck Right is 0.659 g/cm2 with a T-score of -3.2 is markedly low. This patient is considered osteoporotic by [facility]) Criteria. The scan quality is good. Lumbar spine was not utilized due to advanced degenerative changes. World Science writer [facility]) criteria for post-menopausal, Caucasian Women: Normal:                   T-score at or above -1 SD Osteopenia/low bone mass: T-score between -1 and -2.5 SD Osteoporosis:             T-score at or below -2.5 SD RECOMMENDATIONS: 1. All patients should optimize calcium and vitamin D intake. 2. Consider FDA-approved medical therapies in postmenopausal women and men aged 78 years and older, based on the following: a. A hip or vertebral(clinical or morphometric) fracture b. T-score < -2.5 at the femoral neck  vessel ischemic gliosis in the cerebral white matter. There is an infarct in the lateral left frontal cortex which appears recent and is new from 07/01/2022. Vascular: No hyperdense vessel or unexpected calcification. Skull: Normal. Negative for fracture or focal lesion. Sinuses/Orbits: No acute finding. CT CERVICAL SPINE FINDINGS Alignment: Normal. Skull base and vertebrae:  No acute fracture. Sclerotic lesion in the left half of the C3 body, known metastatic prostate cancer. Soft tissues and spinal canal: No acute finding Disc levels:  Mild for age degenerative endplate spurring. Upper chest: Negative IMPRESSION: 1. 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  Known osseous metastatic disease. Electronically Signed   By: Tiburcio Pea M.D.   On: 11/26/2022 10:12   DG Chest Port 1 View  Result Date: 11/26/2022 CLINICAL DATA:  77 year old male with history of trauma from a fall. Sepsis. EXAM: PORTABLE CHEST 1 VIEW COMPARISON:  Chest x-ray 06/18/2021. FINDINGS: Low lung volumes. Linear areas of architectural distortion in the left lower lung, similar to prior study from 06/18/2021, presumably chronic scarring. No acute consolidative airspace disease. No pleural effusions. No pneumothorax. No evidence of pulmonary edema. Heart size is borderline enlarged. The patient is rotated to the left on today's exam, resulting in distortion of the mediastinal contours and reduced diagnostic sensitivity and specificity for mediastinal pathology. Atherosclerotic calcifications are noted in the thoracic aorta. Left-sided pacemaker/AICD with lead tip projecting over the expected location of the right ventricular apex. IMPRESSION: 1. Low lung volumes without radiographic evidence of acute cardiopulmonary disease. 2. Aortic atherosclerosis. Electronically Signed   By: Trudie Reed M.D.   On: 11/26/2022 09:46   DG BONE DENSITY (DXA)  Result Date: 10/28/2022 EXAM: DUAL X-RAY ABSORPTIOMETRY (DXA) FOR BONE MINERAL DENSITY IMPRESSION: Your patient Matthew Mcgrath completed a BMD test on 10/28/2022 using the [employer] iDXA DXA System (software version: 14.10) manufactured by [employer]. The following summarizes the results of our evaluation. Technologist: PATIENT BIOGRAPHICAL: Name: Matthew Mcgrath, Matthew Mcgrath Patient ID: [id number] Birth Date: 02/11/1946 Height: 69.0 in. Gender:  Male Exam Date: 10/28/2022 Weight: 189.1 lbs. Indications: Prostate Cancer Fractures:             Treatments: DENSITOMETRY RESULTS: Site         Region     Measured Date Measured Age WHO Classification Young Adult T-score BMD         %Change vs. Previous Significant Change (*) DualFemur Neck Right 10/28/2022 [age] Osteoporosis -3.2 0.659 g/cm2 - - DualFemur Total Mean 10/28/2022 75.0 Osteopenia -2.3 0.767 g/cm2 - - Left Forearm Radius 33% 10/28/2022 77.0 Osteopenia -1.2 0.869 g/cm2 - - ASSESSMENT: The BMD measured at Femur Neck Right is 0.659 g/cm2 with a T-score of -3.2 is markedly low. This patient is considered osteoporotic by [facility]) Criteria. The scan quality is good. Lumbar spine was not utilized due to advanced degenerative changes. World Science writer [facility]) criteria for post-menopausal, Caucasian Women: Normal:                   T-score at or above -1 SD Osteopenia/low bone mass: T-score between -1 and -2.5 SD Osteoporosis:             T-score at or below -2.5 SD RECOMMENDATIONS: 1. All patients should optimize calcium and vitamin D intake. 2. Consider FDA-approved medical therapies in postmenopausal women and men aged 78 years and older, based on the following: a. A hip or vertebral(clinical or morphometric) fracture b. T-score < -2.5 at the femoral neck  52.0 % 32.1  32.2  31.1   Platelets 150 - 400 K/uL 189  175  173       Latest Ref Rng & Units 12/05/2022   10:39 AM 11/29/2022    5:29 AM 11/28/2022    5:18 AM  CMP  Glucose 70 - 99 mg/dL 161  096  045   BUN 8 - 23 mg/dL 15  11  13    Creatinine 0.61 - 1.24 mg/dL 4.09  8.11  9.14   Sodium 135 - 145 mmol/L 137  137  135   Potassium 3.5 - 5.1 mmol/L 3.9  3.9  3.5   Chloride 98 - 111  mmol/L 105  104  105   CO2 22 - 32 mmol/L 24  21  22    Calcium 8.9 - 10.3 mg/dL 9.1  8.9  8.7   Total Protein 6.5 - 8.1 g/dL 6.9     Total Bilirubin 0.3 - 1.2 mg/dL 0.3     Alkaline Phos 38 - 126 U/L 64     AST 15 - 41 U/L 17     ALT 0 - 44 U/L 14       Iron/TIBC/Ferritin/ %Sat    Component Value Date/Time   IRON 80 12/05/2022 1039   TIBC 347 12/05/2022 1039   FERRITIN 57 12/05/2022 1039   IRONPCTSAT 23 12/05/2022 1039     RADIOGRAPHIC STUDIES: I have personally reviewed the radiological images as listed and agreed with the findings in the report. MR BRAIN W WO CONTRAST  Result Date: 11/28/2022 CLINICAL DATA:  Stroke, follow up Metastatic disease evaluation EXAM: MRI HEAD WITHOUT AND WITH CONTRAST TECHNIQUE: Multiplanar, multiecho pulse sequences of the brain and surrounding structures were obtained without and with intravenous contrast. CONTRAST:  8mL GADAVIST GADOBUTROL 1 MMOL/ML IV SOLN COMPARISON:  CT head 11/26/2022. FINDINGS: Brain: No acute infarction, hemorrhage, hydrocephalus, extra-axial collection or mass lesion. Advanced confluent and patchy T2/FLAIR hyperintensities in the white matter, nonspecific but compatible with chronic microvascular ischemic disease. 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 No pathologic enhancement. Vascular: Major arterial flow voids are maintained at the skull base. Skull and upper cervical spine: Normal marrow signal. Sinuses/Orbits: Clear sinuses.  No acute orbital findings. Other: No meso his free IMPRESSION: 1. No evidence of acute intracranial abnormality or metastatic disease. 2. Advanced chronic microvascular ischemic change. Electronically Signed   By: Feliberto Harts M.D.   On: 11/28/2022 13:32   ECHOCARDIOGRAM COMPLETE  Result Date: 11/27/2022    ECHOCARDIOGRAM REPORT   Patient Name:   Matthew Mcgrath Date of Exam: 11/27/2022 Medical Rec #:  [id number]      Height:       69.0 in Accession #:    [id number]     Weight:       193.8 lb Date  of Birth:  1945-12-15      BSA:          2.038 m Patient Age:    77 years       BP:           117/78 mmHg Patient Gender: M              HR:           81 bpm. Exam Location:  Inpatient Procedure: 2D Echo, Cardiac Doppler and Color Doppler Indications:    stroke  History:        Patient has prior history of Echocardiogram examinations, most

## 2022-12-07 NOTE — Assessment & Plan Note (Signed)
Treatment plan as listed above. 

## 2022-12-08 ENCOUNTER — Ambulatory Visit: Payer: Medicare (Managed Care) | Admitting: Oncology

## 2022-12-10 NOTE — Progress Notes (Signed)
For this patient, acute CVA was ruled out. CT head finding implying for cortical stroke was artificial finding has no clinical significance.

## 2023-01-04 ENCOUNTER — Other Ambulatory Visit (INDEPENDENT_AMBULATORY_CARE_PROVIDER_SITE_OTHER): Payer: Self-pay | Admitting: Vascular Surgery

## 2023-01-04 DIAGNOSIS — I723 Aneurysm of iliac artery: Secondary | ICD-10-CM

## 2023-01-06 ENCOUNTER — Ambulatory Visit (INDEPENDENT_AMBULATORY_CARE_PROVIDER_SITE_OTHER): Payer: Medicare (Managed Care)

## 2023-01-06 ENCOUNTER — Ambulatory Visit (INDEPENDENT_AMBULATORY_CARE_PROVIDER_SITE_OTHER): Payer: Medicare (Managed Care) | Admitting: Nurse Practitioner

## 2023-01-06 ENCOUNTER — Encounter (INDEPENDENT_AMBULATORY_CARE_PROVIDER_SITE_OTHER): Payer: Self-pay | Admitting: Nurse Practitioner

## 2023-01-06 VITALS — BP 138/89 | HR 78 | Resp 16

## 2023-01-06 DIAGNOSIS — I10 Essential (primary) hypertension: Secondary | ICD-10-CM | POA: Diagnosis not present

## 2023-01-06 DIAGNOSIS — E785 Hyperlipidemia, unspecified: Secondary | ICD-10-CM

## 2023-01-06 DIAGNOSIS — I723 Aneurysm of iliac artery: Secondary | ICD-10-CM

## 2023-01-07 ENCOUNTER — Encounter (INDEPENDENT_AMBULATORY_CARE_PROVIDER_SITE_OTHER): Payer: Self-pay | Admitting: Nurse Practitioner

## 2023-01-07 NOTE — Progress Notes (Signed)
Subjective:    Patient ID: Matthew Mcgrath, male    DOB: [birth year], 77 y.o.   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  He denies any complaints currently.  She has no aneurysm related symptoms.  Specifically no new back pain or abdominal pain.  There are no signs symptoms of peripheral embolization.  Today's studies are largely not unchanged from last year.  His largest diameter of his right common iliac artery is 2.2 cm and the left is 1.7 cm.  His aorta is less than 3 cm.     Review of Systems  Neurological:  Positive for weakness.  All other systems reviewed and are negative.      Objective:   Physical Exam Vitals reviewed.  HENT:     Head: Normocephalic.  Cardiovascular:     Rate and Rhythm: Normal rate.  Pulmonary:     Effort: Pulmonary effort is normal.  Skin:    General: Skin is warm and dry.  Neurological:     Mental Status: He is alert and oriented to person, place, and time.     Motor: Weakness present.  Psychiatric:        Mood and Affect: Mood normal.        Behavior: Behavior normal.        Thought Content: Thought content normal.        Judgment: Judgment normal.     BP 138/89 (BP Location: Left Arm)   Pulse 78   Resp 16   Past Medical History:  Diagnosis Date   Cardiomyopathy (HCC)    Congestive heart failure (HCC)    Diabetes mellitus type 2, controlled (HCC)    Elevated lipids    Family history of breast cancer    Family history of colon cancer    HTN (hypertension)     Social History   Socioeconomic History   Marital status: Single    Spouse name: Not on file   Number of children: Not on file   Years of education: Not on file   Highest education level: Not on file  Occupational History   Not on file  Tobacco Use   Smoking status: Former    Current packs/day: 0.00    Average packs/day: 0.3 packs/day for  30.0 years (7.5 ttl pk-yrs)    Types: Cigarettes    Start date: 02/10/1990    Quit date: 02/11/2020    Years since quitting: 2.9    Passive exposure: Past   Smokeless tobacco: Never  Vaping Use   Vaping status: Never Used  Substance and Sexual Activity   Alcohol use: No   Drug use: Not Currently    Frequency: 2.0 times per week    Types: Marijuana    Comment: past use   Sexual activity: Not on file  Other Topics Concern   Not on file  Social History Narrative   Not on file   Social Determinants of Health   Financial Resource Strain: Not on file  Food Insecurity: No Food Insecurity (11/26/2022)   Hunger Vital Sign    Worried About Running Out of Food in the Last Year: Never true    Ran Out of Food in the Last Year: Never true  Transportation Needs: No Transportation Needs (11/26/2022)   PRAPARE - Administrator, Civil Service (Medical): No  Lack of Transportation (Non-Medical): No  Physical Activity: Not on file  Stress: Not on file  Social Connections: Not on file  Intimate Partner Violence: Not At Risk (11/26/2022)   Humiliation, Afraid, Rape, and Kick questionnaire    Fear of Current or Ex-Partner: No    Emotionally Abused: No    Physically Abused: No    Sexually Abused: No    Past Surgical History:  Procedure Laterality Date   CARDIAC CATHETERIZATION N/A 04/14/2015   Procedure: Left Heart Cath and Coronary Angiography;  Surgeon: Marcina Millard, MD;  Location: [facility] INVASIVE CV LAB;  Service: Cardiovascular;  Laterality: N/A;   CATARACT EXTRACTION W/ INTRAOCULAR LENS  IMPLANT, BILATERAL Left    COLONOSCOPY WITH PROPOFOL N/A 04/08/2020   Procedure: COLONOSCOPY WITH PROPOFOL;  Surgeon: Toledo, Boykin Nearing, MD;  Location: [facility] ENDOSCOPY;  Service: Gastroenterology;  Laterality: N/A;   CYSTOSCOPY W/ RETROGRADES Bilateral 07/02/2021   Procedure: CYSTOSCOPY WITH RETROGRADE PYELOGRAM;  Surgeon: Sondra Come, MD;  Location: [facility] ORS;  Service: Urology;   Laterality: Bilateral;   HERNIA REPAIR     IMPLANTABLE CARDIOVERTER DEFIBRILLATOR (ICD) GENERATOR CHANGE Left 07/03/2015   Procedure: ICD IMPLANT single chamber;  Surgeon: Sharion Settler, MD;  Location: [facility] ORS;  Service: Cardiovascular;  Laterality: Left;   TRANSURETHRAL RESECTION OF BLADDER TUMOR N/A 07/02/2021   Procedure: TRANSURETHRAL RESECTION OF BLADDER TUMOR (TURBT);  Surgeon: Sondra Come, MD;  Location: [facility] ORS;  Service: Urology;  Laterality: N/A;    Family History  Problem Relation Age of Onset   Heart failure Mother    Heart disease Mother    Alcoholism Father    Hypertension Sister    Diabetes Sister    Hypertension Brother    Diabetes Brother    Colon cancer Brother        vs prostate   Multiple myeloma Brother    Breast cancer Cousin     No Known Allergies     Latest Ref Rng & Units 12/05/2022   10:39 AM 11/29/2022    5:29 AM 11/28/2022    5:18 AM  CBC  WBC 4.0 - 10.5 K/uL 3.0  3.0  3.5   Hemoglobin 13.0 - 17.0 g/dL 16.1  09.6  04.5   Hematocrit 39.0 - 52.0 % 32.1  32.2  31.1   Platelets 150 - 400 K/uL 189  175  173       CMP     Component Value Date/Time   NA 137 12/05/2022 1039   NA 141 03/14/2014 1557   K 3.9 12/05/2022 1039   K 3.9 03/14/2014 1557   CL 105 12/05/2022 1039   CL 108 (H) 03/14/2014 1557   CO2 24 12/05/2022 1039   CO2 27 03/14/2014 1557   GLUCOSE 229 (H) 12/05/2022 1039   GLUCOSE 232 (H) 03/14/2014 1557   BUN 15 12/05/2022 1039   BUN 10 03/14/2014 1557   CREATININE 0.80 12/05/2022 1039   CREATININE 0.76 03/14/2014 1557   CALCIUM 9.1 12/05/2022 1039   CALCIUM 8.4 (L) 03/14/2014 1557   PROT 6.9 12/05/2022 1039   PROT 6.8 03/14/2014 1557   ALBUMIN 3.6 12/05/2022 1039   ALBUMIN 3.0 (L) 03/14/2014 1557   AST 17 12/05/2022 1039   ALT 14 12/05/2022 1039   ALT 79 (H) 03/14/2014 1557   ALKPHOS 64 12/05/2022 1039   ALKPHOS 115 03/14/2014 1557   BILITOT 0.3 12/05/2022 1039   GFRNONAA >60 12/05/2022 1039   GFRNONAA >60  03/14/2014 1557  No results found.     Assessment & Plan:   1. Iliac artery aneurysm [facility]) The patient studies are largely stable compared to last year.  He continues to be below threshold for consideration of prophylactic repair.  We will continue to follow with duplex.  Patient is advised to avoid smoking to help control progression of aneurysmal disease as well as to control hypertension.  2. Essential (primary) hypertension Continue antihypertensive medications as already ordered, these medications have been reviewed and there are no changes at this time.  3. Hyperlipidemia, unspecified hyperlipidemia type Continue statin as ordered and reviewed, no changes at this time   Current Outpatient Medications on File Prior to Visit  Medication Sig Dispense Refill   acetaminophen (TYLENOL) 325 MG tablet Take 2 tablets (650 mg total) by mouth every 4 (four) hours as needed for mild pain (or temp > 37.5 C (99.5 F)).     ASPIRIN LOW DOSE 81 MG tablet Take 81 mg by mouth daily.     carvedilol (COREG) 3.125 MG tablet Take 3.125 mg by mouth 2 (two) times daily.     cholecalciferol (VITAMIN D3) 25 MCG (1000 UNIT) tablet Take 1,000 Units by mouth daily.     GLUMETZA 1000 MG 24 hr tablet Take 1,000 mg by mouth daily.     LEUPROLIDE ACETATE, 6 MONTH, 45 MG injection Inject into the skin.     LYNPARZA 100 MG tablet Take 100 mg by mouth 2 (two) times daily.     LYNPARZA 150 MG tablet Take 150 mg by mouth 2 (two) times daily.     magnesium oxide (MAG-OX) 400 (240 Mg) MG tablet Take 1 tablet by mouth daily.     metFORMIN (GLUCOPHAGE-XR) 500 MG 24 hr tablet Take 500 mg by mouth daily with breakfast.     Multiple Vitamin (ONE-DAILY MULTI-VITAMIN) TABS Take 1 tablet by mouth daily.     Omega-3 Fatty Acids (OMEGA 3 PO) Take 1 capsule by mouth 2 (two) times daily.     omeprazole (PRILOSEC) 10 MG capsule Take 10 mg by mouth daily.     senna (SENOKOT) 8.6 MG TABS tablet Take 2 tablets by mouth daily.      spironolactone (ALDACTONE) 25 MG tablet Take 0.5 tablets (12.5 mg total) by mouth daily.     tamsulosin (FLOMAX) 0.4 MG CAPS capsule Take 1 capsule (0.4 mg total) by mouth daily. 30 capsule 2   No current facility-administered medications on file prior to visit.    There are no Patient Instructions on file for this visit. No follow-ups on file.   Georgiana Spinner, NP

## 2023-01-16 ENCOUNTER — Emergency Department
Admission: EM | Admit: 2023-01-16 | Discharge: 2023-01-17 | Disposition: A | Discharge status: Home / Self Care | Payer: Medicare (Managed Care) | Attending: Emergency Medicine | Admitting: Emergency Medicine

## 2023-01-16 ENCOUNTER — Other Ambulatory Visit: Payer: Self-pay

## 2023-01-16 ENCOUNTER — Emergency Department: Payer: Medicare (Managed Care)

## 2023-01-16 DIAGNOSIS — N3001 Acute cystitis with hematuria: Secondary | ICD-10-CM | POA: Diagnosis not present

## 2023-01-16 DIAGNOSIS — R296 Repeated falls: Secondary | ICD-10-CM | POA: Insufficient documentation

## 2023-01-16 DIAGNOSIS — E119 Type 2 diabetes mellitus without complications: Secondary | ICD-10-CM | POA: Insufficient documentation

## 2023-01-16 DIAGNOSIS — R531 Weakness: Secondary | ICD-10-CM | POA: Diagnosis not present

## 2023-01-16 DIAGNOSIS — R627 Adult failure to thrive: Secondary | ICD-10-CM | POA: Insufficient documentation

## 2023-01-16 DIAGNOSIS — I10 Essential (primary) hypertension: Secondary | ICD-10-CM | POA: Diagnosis not present

## 2023-01-16 LAB — COMPREHENSIVE METABOLIC PANEL
ALT: 12 U/L (ref 0–44)
AST: 18 U/L (ref 15–41)
Albumin: 3.7 g/dL (ref 3.5–5.0)
Alkaline Phosphatase: 54 U/L (ref 38–126)
Anion gap: 10 (ref 5–15)
BUN: 19 mg/dL (ref 8–23)
CO2: 21 mmol/L — ABNORMAL LOW (ref 22–32)
Calcium: 9 mg/dL (ref 8.9–10.3)
Chloride: 106 mmol/L (ref 98–111)
Creatinine, Ser: 0.99 mg/dL (ref 0.61–1.24)
GFR, Estimated: >60 mL/min (ref >60)
Glucose, Bld: 192 mg/dL — ABNORMAL HIGH (ref 70–99)
Potassium: 3.7 mmol/L (ref 3.5–5.1)
Sodium: 137 mmol/L (ref 135–145)
Total Bilirubin: 0.6 mg/dL (ref <1.2)
Total Protein: 6.8 g/dL (ref 6.5–8.1)

## 2023-01-16 LAB — CBC WITH DIFFERENTIAL/PLATELET
Abs Immature Granulocytes: 0.01 10*3/uL (ref 0.00–0.07)
Basophils Absolute: 0 10*3/uL (ref 0.0–0.1)
Basophils Relative: 1 %
Eosinophils Absolute: 0 10*3/uL (ref 0.0–0.5)
Eosinophils Relative: 1 %
HCT: 32.3 % — ABNORMAL LOW (ref 39.0–52.0)
Hemoglobin: 10.5 g/dL — ABNORMAL LOW (ref 13.0–17.0)
Immature Granulocytes: 0 %
Lymphocytes Relative: 15 %
Lymphs Abs: 0.5 10*3/uL — ABNORMAL LOW (ref 0.7–4.0)
MCH: 33.1 pg (ref 26.0–34.0)
MCHC: 32.5 g/dL (ref 30.0–36.0)
MCV: 101.9 fL — ABNORMAL HIGH (ref 80.0–100.0)
Monocytes Absolute: 0.3 10*3/uL (ref 0.1–1.0)
Monocytes Relative: 9 %
Neutro Abs: 2.3 10*3/uL (ref 1.7–7.7)
Neutrophils Relative %: 74 %
Platelets: 189 10*3/uL (ref 150–400)
RBC: 3.17 MIL/uL — ABNORMAL LOW (ref 4.22–5.81)
RDW: 14.6 % (ref 11.5–15.5)
WBC: 3.1 10*3/uL — ABNORMAL LOW (ref 4.0–10.5)
nRBC: 0 % (ref 0.0–0.2)

## 2023-01-16 LAB — URINALYSIS, ROUTINE W REFLEX MICROSCOPIC
Bilirubin Urine: NEGATIVE
Glucose, UA: 50 mg/dL — AB
Ketones, ur: 5 mg/dL — AB
Leukocytes,Ua: NEGATIVE
Nitrite: NEGATIVE
Protein, ur: 100 mg/dL — AB
RBC / HPF: >50 RBC/hpf (ref 0–5)
Specific Gravity, Urine: 1.024 (ref 1.005–1.030)
pH: 5 (ref 5.0–8.0)

## 2023-01-16 LAB — CK: Total CK: 69 U/L (ref 49–397)

## 2023-01-16 MED ORDER — METFORMIN HCL ER 500 MG PO TB24
1000.0000 mg | ORAL_TABLET | Freq: Every day | ORAL | Status: DC
  Administered 2023-01-17: 1000 mg via ORAL
  Filled 2023-01-16: qty 2

## 2023-01-16 MED ORDER — TAMSULOSIN HCL 0.4 MG PO CAPS
0.4000 mg | ORAL_CAPSULE | Freq: Every day | ORAL | Status: DC
  Administered 2023-01-17: 0.4 mg via ORAL
  Filled 2023-01-16: qty 1

## 2023-01-16 MED ORDER — CEPHALEXIN 500 MG PO CAPS
500.0000 mg | ORAL_CAPSULE | Freq: Once | ORAL | Status: AC
  Administered 2023-01-16: 500 mg via ORAL
  Filled 2023-01-16: qty 1

## 2023-01-16 MED ORDER — CEPHALEXIN 500 MG PO CAPS
500.0000 mg | ORAL_CAPSULE | Freq: Two times a day (BID) | ORAL | Status: DC
  Administered 2023-01-17: 500 mg via ORAL
  Filled 2023-01-16: qty 1

## 2023-01-16 MED ORDER — CARVEDILOL 6.25 MG PO TABS
3.1250 mg | ORAL_TABLET | Freq: Two times a day (BID) | ORAL | Status: DC
  Administered 2023-01-16 – 2023-01-17 (×2): 3.125 mg via ORAL
  Filled 2023-01-16 (×2): qty 1

## 2023-01-16 MED ORDER — OLAPARIB 100 MG PO TABS: 200.0000 mg | ORAL_TABLET | Freq: Two times a day (BID) | ORAL | Status: DC

## 2023-01-16 MED ORDER — ACETAMINOPHEN 325 MG PO TABS: 650.0000 mg | ORAL_TABLET | ORAL | Status: DC | PRN

## 2023-01-16 MED ORDER — ASPIRIN 81 MG PO TBEC
81.0000 mg | DELAYED_RELEASE_TABLET | Freq: Every day | ORAL | Status: DC
  Administered 2023-01-17: 81 mg via ORAL
  Filled 2023-01-16: qty 1

## 2023-01-16 NOTE — ED Triage Notes (Signed)
Pt to ED via EMS from home, pt reports multiple falls and increased weakness over the past the week. Pt denies pain from falls. Pt denies dysuria, denies cough congestion or fever.

## 2023-01-16 NOTE — ED Provider Notes (Signed)
-----------------------------------------   11:03 PM on 01/16/2023 -----------------------------------------  Assuming care from Dr. Lenard Lance.  In short, Matthew Mcgrath is a 77 y.o. male with a chief complaint of frequent falls.  Refer to the original H&P for additional details.  The current plan of care is to follow up CT head.  .   Clinical Course as of 01/17/23 0232  Tue Jan 17, 2023  0053 CT HEAD WO CONTRAST ( ) I viewed and interpreted the patient's CT head and I see no evidence of neoplasm or intracranial bleed.  Awaiting official radiology report.  CK is within normal limits. [CF]  0221 CT HEAD WO CONTRAST ( ) Radiologist confirmed no acute findings on head CT [CF]  0231 I discussed the results with the patient's sister who is at bedside.  Patient is sleeping comfortably.  The patient's sister reiterated that she cannot care for him at home and understands the plan for [facility] consultation to work with the [facility] team for placement. [CF]    Clinical Course User Index [CF] Loleta Rose, MD     Medications  cephALEXin [facility]) capsule 500 mg (has no administration in time range)  acetaminophen (TYLENOL) tablet 650 mg (has no administration in time range)  aspirin EC tablet 81 mg (has no administration in time range)  carvedilol (COREG) tablet 3.125 mg (3.125 mg Oral Given 01/16/23 2300)  metFORMIN (GLUCOPHAGE-XR) 24 hr tablet 1,000 mg (has no administration in time range)  tamsulosin (FLOMAX) capsule 0.4 mg (has no administration in time range)  olaparib (LYNPARZA) tablet 200 mg (has no administration in time range)  cephALEXin (KEFLEX) capsule 500 mg (500 mg Oral Given 01/16/23 2248)     ED Discharge Orders     None      Final diagnoses:  Frequent falls  Failure to thrive in adult  Acute cystitis with hematuria     Loleta Rose, MD 01/17/23 [phone]

## 2023-01-16 NOTE — ED Provider Notes (Signed)
[facility] Provider Note    Event Date/Time   First MD Initiated Contact with Patient 01/16/23 2150     (approximate)  History   Chief Complaint: Fall  HPI  Matthew Mcgrath is a 77 y.o. male with a past medical history of cardiomyopathy, diabetes, hypertension, presents to the emergency department for generalized weakness and increased falls.  According to the patient and the sister is here with the patient, he lives alone although has a close family network.  He follows up with [facility] providers.  They state patient has been falling more frequently over the past week or 2 but this has been a progressive trend over many months.  He was unable to get up today and called EMS who brought him to the emergency department.  Patient estimates he was on the floor for approximately 2 hours before EMS picked him up.  Patient denies any recent illnesses fever cough congestion vomiting or diarrhea.  Patient denies any specific pain.  He is unsure if he hit his head or passed out but does not believe so.  I spoke to the [facility] provider over the phone, they are concerned given the increased frequency of falls that the patient may need to be placed into a short-term rehab facility.  Physical Exam   Triage Vital Signs: ED Triage Vitals  Encounter Vitals Group     BP 01/16/23 1910 (!) 146/85     Systolic BP Percentile --      Diastolic BP Percentile --      Pulse Rate 01/16/23 1910 (!) 103     Resp 01/16/23 1910 16     Temp 01/16/23 1910 98.8 F (37.1 C)     Temp Source 01/16/23 1910 Oral     SpO2 01/16/23 1910 94 %     Weight 01/16/23 1909 190 lb (86.2 kg)     Height 01/16/23 1909 5\' 5"  (1.651 m)     Head Circumference --      Peak Flow --      Pain Score 01/16/23 1908 0     Pain Loc --      Pain Education --      Exclude from Growth Chart --     Most recent vital signs: Vitals:   01/16/23 1910  BP: (!) 146/85  Pulse: (!) 103  Resp: 16  Temp: 98.8 F (37.1 C)   SpO2: 94%    General: Awake, no distress.  CV:  Good peripheral perfusion.  Regular rate and rhythm  Resp:  Normal effort.  Equal breath sounds bilaterally.  Abd:  No distention.  Soft, nontender.  No rebound or guarding.  ED Results / Procedures / Treatments   EKG  EKG viewed and interpreted by myself shows atrial fibrillation versus electrical interference sinus rhythm at 100 bpm with a narrow QRS, normal axis, normal intervals, nonspecific ST changes.  RADIOLOGY  I have reviewed and interpreted CT head images.  No obvious bleed seen on my evaluation. Radiology read pending   MEDICATIONS ORDERED IN ED: Medications - No data to display   IMPRESSION / MDM / ASSESSMENT AND PLAN / ED COURSE  I reviewed the triage vital signs and the nursing notes.  Patient's presentation is most consistent with acute presentation with potential threat to life or bodily function.  Patient presents to the emergency department for increasing falls at home including 1 today in which he was on the ground for at least 2 hours (sister believes longer).  Patient's workup shows no significant findings so far, CBC shows no concerning findings, chemistry shows no significant finding.  Urinalysis does show hematuria with rare bacteria possibly indicating a urinary tract infection we will place the patient on antibiotics and send a urine culture.  CK is pending.  CT scan of the head is pending.  I had a discussion with the patient and the sister.  They do not believe that the patient is strong enough to go home tonight.  Will have social work and physical therapy see in the morning.  I will put in my note to social work that Matthew Mcgrath is available to help with placement as well.  FINAL CLINICAL IMPRESSION(S) / ED DIAGNOSES   Weakness Falls   Note:  This document was prepared using Dragon voice recognition software and may include unintentional dictation errors.   Minna Antis, MD 01/16/23 2242

## 2023-01-17 ENCOUNTER — Emergency Department: Payer: Medicare (Managed Care)

## 2023-01-17 MED ORDER — CEPHALEXIN 500 MG PO CAPS: 500.0000 mg | ORAL_CAPSULE | Freq: Four times a day (QID) | ORAL | 0 refills | Status: AC

## 2023-01-17 NOTE — ED Notes (Signed)
Pt assisted to RR via wheelchair. Pt required x2 staff assist to transfer to wheelchair and to toilet. Pt unsteady on feet and unable to take full steps with both legs.

## 2023-01-17 NOTE — TOC Initial Note (Signed)
Transition of Care [facility]) - Initial/Assessment Note    Patient Details  Name: Matthew Mcgrath MRN: [id number] Date of Birth: [birth year]  Transition [facility] Care [facility]) CM/SW Contact:    Marquita Palms, LCSW Phone Number: 01/17/2023, 10:57 AM  Clinical Narrative:                  CSW spoke with patient bedside, Stacey with [facility], and patient sister [name]. Patient responding to CSW slowly. Patient reports he wanted to "go home." Misty Stanley reports that the patient at baseline is slow but has been slower than usual. Patients sister [name] reported that he can no longer stay at home alone. CSW spoke with Misty Stanley who asked that CSW start the work up for [facility], and [employer] [facility]. She reported that the are in network with them. CSW began SNF workup. Awaiting PT/OT evaluation.    Expected Discharge Plan: Skilled Nursing Facility Barriers to Discharge: Continued Medical Work up   Patient Goals and CMS Choice Patient states their goals for this hospitalization and ongoing recovery are:: N/A   Choice offered to / list presented to : Adult Children      Expected Discharge Plan and Services In-house Referral: Clinical Social Work Discharge Planning Services: Other - See comment ([facility] will follow- up) Post Acute Care Choice: Skilled Nursing Facility Living arrangements for the past 2 months: Single Family Home                   DME Agency: NA                  Prior Living Arrangements/Services Living arrangements for the past 2 months: Single Family Home Lives with:: Self Patient language and need for interpreter reviewed:: No Do you feel safe going back to the place where you live?: No   Patient not safe to go home alone  Need for Family Participation in Patient Care: Yes (Comment) Care giver support system in place?: Yes (comment) Current home services: Other (comment) ([facility]) Criminal Activity/Legal Involvement Pertinent to Current Situation/Hospitalization: No - Comment  as needed  Activities of Daily Living      Permission Sought/Granted Permission sought to share information with : Family Supports Permission granted to share information with : Yes, Verbal Permission Granted  Share Information with NAME: Glenda  Permission granted to share info w AGENCY: [facility]  Permission granted to share info w Relationship: NA  Permission granted to share info w Contact Information: NA  Emotional Assessment Appearance:: Appears older than stated age, Disheveled Attitude/Demeanor/Rapport: Gracious Affect (typically observed): Calm Orientation: : Oriented to Self, Oriented to Place, Oriented to  Time Alcohol / Substance Use: Never Used Psych Involvement: No (comment)  Admission diagnosis:  Weakness Patient Active Problem List   Diagnosis Date Noted   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   Stroke (HCC) 11/26/2022   Acute encephalopathy 11/26/2022   Lower extremity weakness 06/28/2022   Cognitive impairment 05/17/2022   Anemia    Sepsis (HCC) 06/19/2021   Gross hematuria 06/19/2021   Complicated UTI (urinary tract infection) 06/18/2021   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   Genetic testing 02/22/2021   Family history of colon cancer 01/13/2021   Family history of breast cancer 01/13/2021   Encounter for antineoplastic chemotherapy 07/06/2020   Metastatic cancer to bone (HCC) 07/06/2020   Metastasis to bone (HCC) 05/28/2020   Prostate cancer (HCC) 05/11/2020   Goals of care, counseling/discussion 05/11/2020   Iliac artery aneurysm (HCC) 04/12/2016   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   Cardiomyopathy (HCC) 04/24/2015   S/P cardiac catheterization 04/24/2015   Chest pain 12/23/2014   Diabetes (HCC) 11/21/2014   Chronic systolic heart failure (HCC) 11/20/2014   Tobacco abuse 11/20/2014   Pedal edema 11/03/2014   Acute on chronic combined systolic and diastolic congestive heart failure (HCC) 09/20/2014   Elevation of  level of transaminase and lactic acid dehydrogenase (LDH) 09/20/2014   Essential (primary) hypertension 05/02/2014   Hyperlipidemia 05/02/2014   Shortness of breath 05/02/2014   Urinary tract infection 05/02/2014   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   PCP:  [facility] Pharmacy:   [facility] - [place], [place] - [address] [place] [address] Phone: [phone] Fax: [phone]     Social Determinants of Health (SDOH) Social History: SDOH Screenings   Food Insecurity: No Food Insecurity (11/26/2022)  Housing: Low Risk  (11/26/2022)  Transportation Needs: No Transportation Needs (11/26/2022)  Utilities: Not At Risk (11/26/2022)  Tobacco Use: Medium Risk (01/16/2023)   SDOH Interventions:     Readmission Risk Interventions    06/21/2021   11:22 AM  Readmission Risk Prevention Plan  Transportation Screening Complete  PCP or Specialist Appt within 5-7 Days Complete  Home Care Screening Complete  Medication Review (RN CM) Complete

## 2023-01-17 NOTE — Evaluation (Signed)
Occupational Therapy Evaluation Patient Details Name: Matthew Mcgrath MRN: [id number] DOB: 02/29/1946 Today's Date: 01/17/2023   History of Present Illness Pt is a 77 y/o M admitted on 01/16/23 after presenting to the ED with c/o generalized weakness, altered mentations & increased falls. 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 Prior to hospital admission, pt was living in an apartment alone and [facility] F and has a PCA T Th  ~2 hrs.day to assist with IADL tasks. Sister present and reports, PTA pt was able to complete mobility and his basic self care and simple meal prep with MOD I using a RW or W/C. Could ambulated short household distances with RW. However had a recent 2 week rehab stay and returned home.   Pt presents to acute OT demonstrating impaired ADL performance and functional mobility 2/2 weakness, pain, balance deficits, decreased activity tolerance and multiple falls (See OT problem list for additional functional deficits). Pt currently requires Min/Mod A for supine to sit at EOB including scooting foward. Min A x2 for STS from high gurney, more assist likely needed from lower surface. Increased time needed for processing directions and performing tasks with ability to take a few lateral steps with walker management and Mod A x2. Returned to bed with Mod/Max A for BLE management. Likely to need Max A for LB ADLs as he needed assist for brief management. Minimal pain to R ankle reported. Able to state name, DOB/age, and that he was in a hospital, but unsure of year or name of city.  Pt would benefit from skilled OT services to address noted impairments and functional limitations (see below for any additional details) in order to maximize safety and independence while minimizing falls risk and caregiver burden. Do anticipate the need  for follow up OT services upon acute hospital DC.       If plan is discharge home, recommend the following: A lot of help with walking and/or transfers;A lot of help with bathing/dressing/bathroom;Supervision due to cognitive status;Direct supervision/assist for medications management;Direct supervision/assist for financial management;Assist for transportation;Help with stairs or ramp for entrance;Assistance with cooking/housework    Functional Status Assessment  Patient has had a recent decline in their functional status and demonstrates the ability to make significant improvements in function in a reasonable and predictable amount of time.  Equipment Recommendations  Other (comment) (defer)    Recommendations for Other Services       Precautions / Restrictions Precautions Precautions: Fall Restrictions Weight Bearing Restrictions: No      Mobility Bed Mobility Overal bed mobility: Needs Assistance Bed Mobility: Supine to Sit, Sit to Supine     Supine to sit: Min assist, Mod assist, HOB elevated, Used rails (assistance to move RLE to EOB, pt holds to PTs hand to pull trunk upright, assistance to fully scoot to sitting EOB) Sit to supine: Mod assist, Max assist, HOB elevated, Used rails (assistance to elevate BLE onto bed)   General bed mobility comments: +2 to scoot to [facility] with bed in trendelenburg position; Min/Mod A for sitting EOB for movement of RLE, and HHA from PT d/t no bed rails to pull on gurney and assists to scoot to get feet on floor    Transfers Overall transfer level: Needs assistance Equipment used: Rolling walker (2 wheels) Transfers: Sit to/from Stand Sit to Stand:  Min assist, +2 physical assistance           General transfer comment: STS from elevated EOB with cuing for hand placement; Mod A x2 for lateral side steps, did not attempt forward mobility as pt unable to advance RLE on his own      Balance Overall balance assessment: Needs  assistance Sitting-balance support: Feet supported Sitting balance-Leahy Scale: Poor     Standing balance support: During functional activity, Bilateral upper extremity supported, Reliant on assistive device for balance Standing balance-Leahy Scale: Poor Standing balance comment: posterior bias and foward flexed hips/trunk in standing with dependence on RW and assist from x2 therapists                           ADL either performed or assessed with clinical judgement   ADL Overall ADL's : Needs assistance/impaired                     Lower Body Dressing: Maximal assistance Lower Body Dressing Details (indicate cue type and reason): Max to fix the brief               General ADL Comments: likely to need Max A with LB ADLs at this time based on slow processing and weakness     Vision         Perception         Praxis         Pertinent Vitals/Pain Pain Assessment Pain Assessment: Faces Faces Pain Scale: Hurts a little bit Pain Location: R ankle Pain Descriptors / Indicators: Discomfort Pain Intervention(s): Monitored during session     Extremity/Trunk Assessment Upper Extremity Assessment Upper Extremity Assessment: Generalized weakness   Lower Extremity Assessment Lower Extremity Assessment: Generalized weakness (hx of R LE weakness)   Cervical / Trunk Assessment Cervical / Trunk Assessment: Kyphotic (RUE/RLE trembling)   Communication Communication Communication: No apparent difficulties   Cognition Arousal: Alert Behavior During Therapy: Flat affect Overall Cognitive Status: Impaired/Different from baseline Area of Impairment: Following commands, Safety/judgement, Awareness, Problem solving                       Following Commands: Follows one step commands inconsistently, Follows one step commands with increased time Safety/Judgement: Decreased awareness of safety, Decreased awareness of deficits Awareness: Anticipatory,  Emergent Problem Solving: Requires tactile cues, Requires verbal cues, Decreased initiation, Slow processing, Difficulty sequencing General Comments: Sister reports pt's processing time is more delayed than usual. Oriented to self, location (hospital & city). Pt appears to have decreased motor planning.     General Comments       Exercises Other Exercises Other Exercises: Edu on role of OT in acute setting and importance of therapy for recovery.   Shoulder Instructions      Home Living Family/patient expects to be discharged to:: Private residence Living Arrangements: Alone Available Help at Discharge: Family;Personal care attendant;Available PRN/intermittently Type of Home: Apartment Home Access: Level entry     Home Layout: One level               Home Equipment: Agricultural consultant (2 wheels);Shower seat;Wheelchair - manual   Additional Comments: Attends [facility] during the day.      Prior Functioning/Environment Prior Level of Function : Independent/Modified Independent             Mobility Comments: mobilizes with either a RW or a w/c, ambulates short household distances ADLs  Comments: wears a depends at baseline        OT Problem List: Decreased strength;Pain;Decreased cognition;Decreased activity tolerance;Impaired balance (sitting and/or standing)      OT Treatment/Interventions: Self-care/ADL training;Therapeutic exercise;Therapeutic activities;Energy conservation;Patient/family education;Balance training    OT Goals(Current goals can be found in the care plan section) Acute Rehab OT Goals Patient Stated Goal: improve his strength OT Goal Formulation: With patient Time For Goal Achievement: 01/31/23 Potential to Achieve Goals: Fair ADL Goals Pt Will Perform Grooming: sitting;with set-up Pt Will Perform Upper Body Bathing: with supervision;sitting Pt Will Perform Lower Body Dressing: with min assist;sitting/lateral leans;sit to/from stand Pt Will Transfer  to Toilet: with min assist;ambulating;regular height toilet;grab bars Pt Will Perform Toileting - Clothing Manipulation and hygiene: with min assist;sit to/from stand;sitting/lateral leans Additional ADL Goal #1: Pt will perform bed mobility with CGA and good safety to promote return to PLOF.  OT Frequency: Min 1X/week    Co-evaluation   Reason for Co-Treatment: Complexity of the patient's impairments (multi-system involvement);Necessary to address cognition/behavior during functional activity;For patient/therapist safety PT goals addressed during session: Mobility/safety with mobility;Balance;Proper use of DME        AM-PAC OT "6 Clicks" Daily Activity     Outcome Measure Help from another person eating meals?: A Little Help from another person taking care of personal grooming?: A Little Help from another person toileting, which includes using toliet, bedpan, or urinal?: A Lot Help from another person bathing (including washing, rinsing, drying)?: A Lot Help from another person to put on and taking off regular upper body clothing?: A Lot Help from another person to put on and taking off regular lower body clothing?: A Lot 6 Click Score: 14   End of Session Equipment Utilized During Treatment: Rolling walker (2 wheels) Nurse Communication: Mobility status  Activity Tolerance: Patient tolerated treatment well Patient left: in bed;with call bell/phone within reach;with family/visitor present  OT Visit Diagnosis: Other abnormalities of gait and mobility (R26.89);Repeated falls (R29.6);Muscle weakness (generalized) (M62.81)                Time: 5284-1324 OT Time Calculation (min): 21 min Charges:  OT General Charges $OT Visit: 1 Visit OT Evaluation $OT Eval Moderate Complexity: 1 Mod Dashonda Bonneau, OTR/L 01/17/23, 11:50 AM  Cote Mayabb E Emalea Mix 01/17/2023, 11:46 AM

## 2023-01-17 NOTE — Evaluation (Signed)
Physical Therapy Evaluation Patient Details Name: Matthew Mcgrath MRN: [id number] DOB: 01-20-1946 Today's Date: 01/17/2023  History of Present Illness  Pt is a 77 y/o M admitted on 01/16/23 after presenting to the ED with c/o generalized weakness, altered mentations & increased falls. 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 Pt received in bed with sister present. Prior to admission pt was attending the [facility], home in the evenings but had a PCA come a couple hours/day. Pt was mobilizing with RW or w/c, only walking short household distances, but has a hx of several falls. On this date, pt presents with generalized & RUE/RLE weakness (noted to have L CVA in Sept 2024). Pt also presents with decreased cognition compared to baseline. Pt requires up mod assist for supine>sit, max assist sit>supine, +2 assist for STS & to attempt side steps at EOB. Pt is unsafe to return home alone & does not have 24 hr support. Patient will benefit from continued inpatient follow up therapy, <3 hours/day upon d/c from acute setting.        If plan is discharge home, recommend the following: A lot of help with bathing/dressing/bathroom;A lot of help with walking and/or transfers;Assist for transportation;Direct supervision/assist for financial management;Assistance with cooking/housework;Help with stairs or ramp for entrance;Direct supervision/assist for medications management;Supervision due to cognitive status   Can travel by private vehicle   No    Equipment Recommendations Other (comment) (defer to next venue)  Recommendations for Other Services       Functional Status Assessment Patient has had a recent decline in their functional status and demonstrates the ability to make significant improvements in function in a reasonable and predictable amount of time.      Precautions / Restrictions Precautions Precautions: Fall Restrictions Weight Bearing Restrictions: No      Mobility  Bed Mobility Overal bed mobility: Needs Assistance Bed Mobility: Supine to Sit, Sit to Supine     Supine to sit: Min assist, Mod assist, HOB elevated, Used rails (assistance to move RLE to EOB, pt holds to PTs hand to pull trunk upright, assistance to fully scoot to sitting EOB) Sit to supine: Mod assist, Max assist, HOB elevated, Used rails (assistance to elevate BLE onto bed)   General bed mobility comments: +2 to scoot to [facility] with bed in trendelenburg position    Transfers Overall transfer level: Needs assistance Equipment used: Rolling walker (2 wheels) Transfers: Sit to/from Stand Sit to Stand: Min assist, +2 physical assistance           General transfer comment: STS from elevated EOB with cuing re: hand placement    Ambulation/Gait         Gait velocity: decreased     General Gait Details: Pt attempts to take step 1 step forward & then to L along EOB with RW & min/mod assist +2. Pt requires assistance to move RW to L, cuing for stepping, assistance to move RLE, extra time overall.  Stairs            Wheelchair Mobility     Tilt Bed    Modified Rankin (Stroke Patients Only)       Balance Overall balance assessment: Needs assistance Sitting-balance support: Feet supported Sitting balance-Leahy Scale: Poor     Standing balance support: During functional activity, Bilateral upper extremity supported, Reliant on assistive device for  balance Standing balance-Leahy Scale: Poor                               Pertinent Vitals/Pain Pain Assessment Pain Assessment: Faces Faces Pain Scale: Hurts a little bit Pain Location: R ankle Pain Descriptors / Indicators: Discomfort Pain Intervention(s): Monitored during session (pt resistive to PROM dorsiflexion, notified nurse)    Home Living Family/patient expects to  be discharged to:: Private residence Living Arrangements: Alone Available Help at Discharge: Family;Personal care attendant;Available PRN/intermittently Type of Home: Apartment Home Access: Level entry       Home Layout: One level Home Equipment: Agricultural consultant (2 wheels);Shower seat;Wheelchair - manual Additional Comments: Attends [facility] during the day.    Prior Function Prior Level of Function : Independent/Modified Independent             Mobility Comments: mobilizes with either a RW or a w/c, ambulates short household distances ADLs Comments: wears a depends at baseline     Extremity/Trunk Assessment   Upper Extremity Assessment Upper Extremity Assessment: Generalized weakness    Lower Extremity Assessment Lower Extremity Assessment: Generalized weakness (hx of R LE weakness)    Cervical / Trunk Assessment Cervical / Trunk Assessment: Kyphotic (RUE/RLE trembling)  Communication   Communication Communication: No apparent difficulties  Cognition Arousal: Alert Behavior During Therapy: Flat affect Overall Cognitive Status: Impaired/Different from baseline Area of Impairment: Following commands, Safety/judgement, Awareness, Problem solving                       Following Commands: Follows one step commands inconsistently, Follows one step commands with increased time Safety/Judgement: Decreased awareness of safety, Decreased awareness of deficits Awareness: Anticipatory, Emergent Problem Solving: Requires tactile cues, Requires verbal cues, Decreased initiation, Slow processing, Difficulty sequencing General Comments: Sister reports pt's processing time is more delayed than usual. Oriented to self, location (hospital & city). Pt appears to have decreased motor planning.        General Comments      Exercises     Assessment/Plan    PT Assessment Patient needs continued PT services  PT Problem List Decreased strength;Decreased cognition;Decreased  coordination;Decreased activity tolerance;Decreased balance;Decreased mobility;Decreased safety awareness;Decreased knowledge of use of DME;Decreased range of motion;Pain       PT Treatment Interventions Therapeutic exercise;DME instruction;Gait training;Stair training;Functional mobility training;Therapeutic activities;Patient/family education;Neuromuscular re-education;Balance training;Cognitive remediation    PT Goals (Current goals can be found in the Care Plan section)  Acute Rehab PT Goals Patient Stated Goal: get better PT Goal Formulation: With patient/family Time For Goal Achievement: 01/31/23 Potential to Achieve Goals: Good    Frequency Min 1X/week     Co-evaluation PT/OT/SLP Co-Evaluation/Treatment: Yes Reason for Co-Treatment: Complexity of the patient's impairments (multi-system involvement);Necessary to address cognition/behavior during functional activity;For patient/therapist safety PT goals addressed during session: Mobility/safety with mobility;Balance;Proper use of DME         AM-PAC PT "6 Clicks" Mobility  Outcome Measure Help needed turning from your back to your side while in a flat bed without using bedrails?: A Little Help needed moving from lying on your back to sitting on the side of a flat bed without using bedrails?: A Lot Help needed moving to and from a bed to a chair (including a wheelchair)?: A Lot Help needed standing up from a chair using your arms (e.g., wheelchair or bedside chair)?: A Lot Help needed to walk in hospital room?: A Lot Help needed climbing  3-5 steps with a railing? : Total 6 Click Score: 12    End of Session   Activity Tolerance: Patient tolerated treatment well Patient left: in bed;with family/visitor present Nurse Communication:  (pt c/o R ankle pain) PT Visit Diagnosis: Difficulty in walking, not elsewhere classified (R26.2);History of falling (Z91.81);Muscle weakness (generalized) (M62.81);Other abnormalities of gait and  mobility (R26.89)    Time: 6962-9528 PT Time Calculation (min) (ACUTE ONLY): 21 min   Charges:   PT Evaluation $PT Eval Low Complexity: 1 Low   PT General Charges $$ ACUTE PT VISIT: 1 Visit         Aleda Grana, PT, DPT 01/17/23, 11:40 AM   Sandi Mariscal 01/17/2023, 11:38 AM

## 2023-01-17 NOTE — ED Provider Notes (Signed)
Patient has been arranged to go to [facility] today   Phineas Semen, MD 01/17/23 [phone]

## 2023-01-17 NOTE — ED Notes (Signed)
Called dayshift SW, no answer- left voicemail for updates on Pts POC

## 2023-01-17 NOTE — ED Notes (Signed)
Pt with urinary incontinence.  Pt cleaned and fresh diaper in place.  Pt waiting on transport.

## 2023-01-17 NOTE — ED Notes (Signed)
[facility] picked pt up  for transport.   Jacque charge nurse d/ced pt.

## 2023-01-17 NOTE — [place] FL2 (Addendum)
[place] MEDICAID FL2 LEVEL OF CARE FORM     IDENTIFICATION  Patient Name: Matthew Mcgrath Birthdate: Aug 18, 1945 Sex: male Admission Date (Current Location): 01/16/2023  [place] and [place] Number:  Chiropodist and Address:  [facility], [address] [place], [place] [address]      Provider Number: [id number]  Attending Physician Name and Address:  No att. 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   Stroke (HCC) 11/26/2022   Acute encephalopathy 11/26/2022   Lower extremity weakness 06/28/2022   Cognitive impairment 05/17/2022   Anemia    Sepsis (HCC) 06/19/2021   Gross hematuria 06/19/2021   Complicated UTI (urinary tract infection) 06/18/2021   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   Genetic testing 02/22/2021   Family history of colon cancer 01/13/2021   Family history of breast cancer 01/13/2021   Encounter for antineoplastic chemotherapy 07/06/2020   Metastatic cancer to bone (HCC) 07/06/2020   Metastasis to bone (HCC) 05/28/2020   Prostate cancer (HCC) 05/11/2020   Goals of care, counseling/discussion 05/11/2020   Iliac artery aneurysm (HCC) 04/12/2016   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   Cardiomyopathy (HCC) 04/24/2015   S/P cardiac catheterization 04/24/2015   Chest pain 12/23/2014   Diabetes (HCC) 11/21/2014   Chronic systolic heart failure (HCC) 11/20/2014   Tobacco abuse 11/20/2014   Pedal edema 11/03/2014   Acute on chronic combined systolic and diastolic congestive heart failure (HCC) 09/20/2014   Elevation of level of transaminase  and lactic acid dehydrogenase (LDH) 09/20/2014   Essential (primary) hypertension 05/02/2014   Hyperlipidemia 05/02/2014   Shortness of breath 05/02/2014   Urinary tract infection 05/02/2014   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    Orientation RESPIRATION BLADDER Height & Weight     Self, Time, Situation, Place  Normal   Weight: 190 lb (86.2 kg) Height:  5\' 5"  (165.1 cm)  BEHAVIORAL SYMPTOMS/MOOD NEUROLOGICAL BOWEL NUTRITION STATUS           AMBULATORY STATUS COMMUNICATION OF NEEDS Skin   Extensive Assist Verbally (Speaks slowly)                         Personal Care Assistance Level of Assistance  Bathing, Feeding, Dressing Bathing Assistance: Maximum assistance Feeding assistance: Maximum assistance Dressing Assistance: Maximum assistance     Functional Limitations Info  Sight, Hearing, Speech Sight Info: Adequate Hearing Info: Adequate Speech Info: Adequate    SPECIAL CARE FACTORS FREQUENCY                       Contractures      Additional Factors Info  Code Status Code Status Info: FULL             Current Medications (01/17/2023):  This is the current hospital active medication list Current Facility-Administered Medications  Medication Dose Route Frequency Provider Last Rate Last Admin   acetaminophen (TYLENOL) tablet 650 mg  650 mg Oral Q4H PRN Minna Antis, MD       aspirin EC tablet 81 mg  81 mg Oral Daily Minna Antis, MD  81 mg at 01/17/23 1026   carvedilol (COREG) tablet 3.125 mg  3.125 mg Oral BID Minna Antis, MD   3.125 mg at 01/17/23 1026   cephALEXin (KEFLEX) capsule 500 mg  500 mg Oral Q12H Minna Antis, MD   500 mg at 01/17/23 1026   metFORMIN (GLUCOPHAGE-XR) 24 hr tablet 1,000 mg  1,000 mg Oral Q breakfast Minna Antis, MD   1,000 mg at 01/17/23 1026   olaparib (LYNPARZA) tablet 200 mg  200 mg Oral BID Otelia Sergeant, RPH       tamsulosin [facility]) capsule 0.4 mg  0.4 mg Oral  Daily Minna Antis, MD   0.4 mg at 01/17/23 1026   Current Outpatient Medications  Medication Sig Dispense Refill   acetaminophen (TYLENOL) 325 MG tablet Take 2 tablets (650 mg total) by mouth every 4 (four) hours as needed for mild pain (or temp > 37.5 C (99.5 F)).     acetaminophen (TYLENOL) 650 MG CR tablet Take 1,300 mg by mouth 2 (two) times daily.     ASPIRIN LOW DOSE 81 MG tablet Take 81 mg by mouth daily.     carvedilol (COREG) 3.125 MG tablet Take 3.125 mg by mouth 2 (two) times daily.     cholecalciferol (VITAMIN D3) 25 MCG (1000 UNIT) tablet Take 1,000 Units by mouth daily.     GLUMETZA 1000 MG 24 hr tablet Take 1,000 mg by mouth daily.     LEUPROLIDE ACETATE, 6 MONTH, 45 MG injection Inject into the skin.     LYNPARZA 100 MG tablet Take 100 mg by mouth 2 (two) times daily.     LYNPARZA 150 MG tablet Take 150 mg by mouth 2 (two) times daily.     magnesium oxide (MAG-OX) 400 (240 Mg) MG tablet Take 1 tablet by mouth daily.     metFORMIN (GLUCOPHAGE-XR) 500 MG 24 hr tablet Take 500 mg by mouth daily with breakfast.     Multiple Vitamin (ONE-DAILY MULTI-VITAMIN) TABS Take 1 tablet by mouth daily.     Omega-3 Fatty Acids (OMEGA 3 PO) Take 1 capsule by mouth 2 (two) times daily.     omeprazole (PRILOSEC) 10 MG capsule Take 10 mg by mouth daily.     senna (SENOKOT) 8.6 MG TABS tablet Take 2 tablets by mouth daily.     spironolactone (ALDACTONE) 25 MG tablet Take 0.5 tablets (12.5 mg total) by mouth daily.     tamsulosin (FLOMAX) 0.4 MG CAPS capsule Take 1 capsule (0.4 mg total) by mouth daily. 30 capsule 2     Discharge Medications: Please see discharge summary for a list of discharge medications.  Relevant Imaging Results:  Relevant Lab Results:   Additional Information SSN: [id number]  Marquita Palms, LCSW

## 2023-01-18 LAB — URINE CULTURE: Culture: 20000 — AB

## 2023-02-09 ENCOUNTER — Encounter: Payer: Self-pay | Admitting: Urology

## 2023-02-09 ENCOUNTER — Ambulatory Visit: Payer: Medicare (Managed Care) | Admitting: Urology

## 2023-02-09 VITALS — BP 138/87 | HR 87 | Ht 65.0 in | Wt 190.0 lb

## 2023-02-09 DIAGNOSIS — Z8551 Personal history of malignant neoplasm of bladder: Secondary | ICD-10-CM | POA: Diagnosis not present

## 2023-02-09 MED ORDER — SULFAMETHOXAZOLE-TRIMETHOPRIM 800-160 MG PO TABS
1.0000 | ORAL_TABLET | Freq: Once | ORAL | Status: AC
  Administered 2023-02-09: 1 via ORAL

## 2023-02-09 NOTE — Progress Notes (Signed)
Bladder cancer surveillance note  INDICATION History of bladder cancer  UROLOGIC HISTORY Matthew Mcgrath is a 77 y.o. 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  With his comorbidities and frailty, second look TURBT and other intravesical treatments deferred.  Patient and family also opted to space surveillance imaging to every 6 months with his other medical issues and frailty, and understand the risk of delayed diagnosis of recurrent bladder cancer.  Initial Diagnosis of Bladder  Year: 07/02/2021 Pathology: HG T1 urothelial cell carcinoma  Recurrent Bladder Cancer Diagnosis None  Treatments for Bladder Cancer 07/02/2021: TURBT  AUA Risk Category High  Cystoscopy Procedure Note:  Bactrim given for prophylaxis  After informed consent and discussion of the procedure and its risks, Matthew Mcgrath was positioned and prepped in the standard fashion. Cystoscopy was performed with the a flexible cystoscope.  Prostate was moderate in size.  The urethra, bladder neck and entire bladder was visualized in a standard fashion.  Bladder mucosa grossly normal throughout.  The ureteral orifices were visualized in their normal location and orientation.  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 Cytology deferred with his age, metastatic PCa, and co-morbidities  -Continue follow-up with oncology for metastatic prostate cancer -Using shared decision making, he would like to space surveillance cystoscopy out to every 9 months.  I think this is very reasonable with his worsening overall health and frailty.  Return  precautions were discussed   Legrand Rams, MD 02/09/2023

## 2023-03-13 ENCOUNTER — Inpatient Hospital Stay: Payer: Medicare (Managed Care) | Attending: Oncology

## 2023-03-13 DIAGNOSIS — D649 Anemia, unspecified: Secondary | ICD-10-CM | POA: Diagnosis not present

## 2023-03-13 DIAGNOSIS — Z803 Family history of malignant neoplasm of breast: Secondary | ICD-10-CM | POA: Insufficient documentation

## 2023-03-13 DIAGNOSIS — Z87891 Personal history of nicotine dependence: Secondary | ICD-10-CM | POA: Insufficient documentation

## 2023-03-13 DIAGNOSIS — Z807 Family history of other malignant neoplasms of lymphoid, hematopoietic and related tissues: Secondary | ICD-10-CM | POA: Insufficient documentation

## 2023-03-13 DIAGNOSIS — C61 Malignant neoplasm of prostate: Secondary | ICD-10-CM

## 2023-03-13 DIAGNOSIS — Z923 Personal history of irradiation: Secondary | ICD-10-CM | POA: Diagnosis not present

## 2023-03-13 DIAGNOSIS — Z800 Family history of malignant neoplasm of digestive organs: Secondary | ICD-10-CM | POA: Insufficient documentation

## 2023-03-13 DIAGNOSIS — C7951 Secondary malignant neoplasm of bone: Secondary | ICD-10-CM | POA: Insufficient documentation

## 2023-03-13 LAB — CMP (CANCER CENTER ONLY)
ALT: 12 U/L (ref 0–44)
AST: 16 U/L (ref 15–41)
Albumin: 3.6 g/dL (ref 3.5–5.0)
Alkaline Phosphatase: 71 U/L (ref 38–126)
Anion gap: 10 (ref 5–15)
BUN: 24 mg/dL — ABNORMAL HIGH (ref 8–23)
CO2: 24 mmol/L (ref 22–32)
Calcium: 9.3 mg/dL (ref 8.9–10.3)
Chloride: 104 mmol/L (ref 98–111)
Creatinine: 0.8 mg/dL (ref 0.61–1.24)
GFR, Estimated: >60 mL/min (ref >60)
Glucose, Bld: 265 mg/dL — ABNORMAL HIGH (ref 70–99)
Potassium: 4 mmol/L (ref 3.5–5.1)
Sodium: 138 mmol/L (ref 135–145)
Total Bilirubin: 0.6 mg/dL (ref 0.0–1.2)
Total Protein: 6.8 g/dL (ref 6.5–8.1)

## 2023-03-13 LAB — CBC WITH DIFFERENTIAL (CANCER CENTER ONLY)
Abs Immature Granulocytes: 0.02 10*3/uL (ref 0.00–0.07)
Basophils Absolute: 0 10*3/uL (ref 0.0–0.1)
Basophils Relative: 1 %
Eosinophils Absolute: 0 10*3/uL (ref 0.0–0.5)
Eosinophils Relative: 1 %
HCT: 32.5 % — ABNORMAL LOW (ref 39.0–52.0)
Hemoglobin: 10.4 g/dL — ABNORMAL LOW (ref 13.0–17.0)
Immature Granulocytes: 1 %
Lymphocytes Relative: 15 %
Lymphs Abs: 0.6 10*3/uL — ABNORMAL LOW (ref 0.7–4.0)
MCH: 32.3 pg (ref 26.0–34.0)
MCHC: 32 g/dL (ref 30.0–36.0)
MCV: 100.9 fL — ABNORMAL HIGH (ref 80.0–100.0)
Monocytes Absolute: 0.3 10*3/uL (ref 0.1–1.0)
Monocytes Relative: 9 %
Neutro Abs: 2.7 10*3/uL (ref 1.7–7.7)
Neutrophils Relative %: 73 %
Platelet Count: 205 10*3/uL (ref 150–400)
RBC: 3.22 MIL/uL — ABNORMAL LOW (ref 4.22–5.81)
RDW: 14.8 % (ref 11.5–15.5)
WBC Count: 3.7 10*3/uL — ABNORMAL LOW (ref 4.0–10.5)
nRBC: 0.5 % — ABNORMAL HIGH (ref 0.0–0.2)

## 2023-03-13 LAB — PSA: Prostatic Specific Antigen: 4.54 ng/mL — ABNORMAL HIGH (ref 0.00–4.00)

## 2023-03-14 ENCOUNTER — Inpatient Hospital Stay: Payer: Medicare (Managed Care)

## 2023-03-14 ENCOUNTER — Encounter: Payer: Self-pay | Admitting: Oncology

## 2023-03-14 ENCOUNTER — Inpatient Hospital Stay (HOSPITAL_BASED_OUTPATIENT_CLINIC_OR_DEPARTMENT_OTHER): Payer: Medicare (Managed Care) | Admitting: Oncology

## 2023-03-14 VITALS — BP 130/83 | HR 92 | Temp 98.1°F | Resp 18 | Wt 178.0 lb

## 2023-03-14 DIAGNOSIS — D649 Anemia, unspecified: Secondary | ICD-10-CM | POA: Diagnosis not present

## 2023-03-14 DIAGNOSIS — Z5111 Encounter for antineoplastic chemotherapy: Secondary | ICD-10-CM

## 2023-03-14 DIAGNOSIS — C61 Malignant neoplasm of prostate: Secondary | ICD-10-CM

## 2023-03-14 DIAGNOSIS — C7951 Secondary malignant neoplasm of bone: Secondary | ICD-10-CM

## 2023-03-14 LAB — PSA: Prostatic Specific Antigen: 4.93 ng/mL — ABNORMAL HIGH (ref 0.00–4.00)

## 2023-03-14 NOTE — Assessment & Plan Note (Signed)
 Treatment plan as listed above.

## 2023-03-14 NOTE — Assessment & Plan Note (Signed)
Slight decreased hemoglobin.  Likely due to marrow side effects due to Olarparib.  Normal  iron tibc ferritin, folate, b12

## 2023-03-14 NOTE — Assessment & Plan Note (Signed)
 S/p palliative RT Sister and patient previously declined bone strengthening treatment.

## 2023-03-14 NOTE — Progress Notes (Signed)
 Hematology/Oncology Progress note [phone] [phone]     CHIEF COMPLAINTS/REASON FOR VISIT:  metastatic prostate cancer   ASSESSMENT & PLAN:   Cancer Staging  Prostate cancer [facility]) Staging form: Prostate, AJCC 8th Edition - Clinical stage from 05/11/2020: Stage IVB (cTX, cN0, cM1, PSA: 30) - Signed by Babara Call, MD on 05/11/2020   Prostate cancer [facility]) #Metastatic prostate cancer to bone, castration resistance disease. 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   PSA nadir 0.01. PMSA showed new bone lesions, s/p palliative radiation.  PSA 3.29 --> 4.72-->0.9 -->0.16-->0.17-->4 Labs are reviewed and discussed with patient. Eligard 45 mg Q6 months with [facility]- Continue Olarparib 250mg  BID [ dose reduced due to performance status].  Patient will get medication through [facility].  PSA has rapidly increased. Check testosterone  level and repeat PSA.  Obtain PSMA PET scan.   Metastasis to bone [facility]) S/p palliative RT Sister and patient previously declined bone strengthening treatment.    Anemia Slight decreased hemoglobin.  Likely due to marrow side effects due to Olarparib.  Normal  iron  tibc ferritin, folate, b12  Encounter for antineoplastic chemotherapy Treatment plan as listed above   Orders Placed This Encounter  Procedures   NM PET (PSMA) SKULL TO MID THIGH    Standing Status:   Future    Expected Date:   03/21/2023    Expiration Date:   03/13/2024    If indicated for the ordered procedure, I authorize the administration of a radiopharmaceutical per Radiology protocol:   Yes    Preferred imaging location?:   [facility]   Testosterone     Standing Status:   Future    Number of Occurrences:   1    Expected Date:   03/14/2023    Expiration Date:   03/13/2024   PSA    Standing Status:   Future    Number of  Occurrences:   1    Expected Date:   03/14/2023    Expiration Date:   03/13/2024   Follow up after PET All questions were answered. The patient knows to call the clinic with any problems, questions or concerns.  Call Babara, MD, PhD [facility] Hematology Oncology 03/14/2023    HISTORY OF PRESENTING ILLNESS:   Matthew Mcgrath is a  78 y.o.  male presents for metastatic prostate cancer. Oncology History  Prostate cancer (HCC)  04/16/2020 Procedure   Firmagon  240 mg loading dose   04/21/2020 Imaging   CT abdomen pelvis showed a sclerotic lesion in the lumbar spine concerning for probable metastatic disease.  No definitive extra skeletal metastatic disease noted elsewhere in the abdomen or pelvis.  Mild circumferential bladder wall thickening.  Aortic atherosclerosis.   05/11/2020 Initial Diagnosis   Prostate cancer   04/01/2020, prostate biopsy showed  All cores were positive for high risk prostate cancer, primarily Gleason score 5+4 = 9, with max core involvement of 100% and perineural invasion present.   05/11/2020 Cancer Staging   Staging form: Prostate, AJCC 8th Edition - Clinical stage from 05/11/2020: Stage IVB (cTX, cN0, cM1, PSA: 30) - Signed by Babara Call, MD on 05/11/2020 Prostate specific antigen (PSA) range: 20 or greater   10/19/2020 -  Radiation Therapy   finished definitive RT. to prostate cancer and pelvic lymph node treatments.   02/13/2021 Imaging   CT chest abdomen  pelvis without contrast showed slight interval increase in sclerosis of previously osseous metastasis involving lumbar spine, most consistent with posttreatment sclerosis.  Focally severe disc degeneration and sclerosis versus sclerotic osseous metastatic of T6 and 7.  Attention on follow-up.  No noncontrast evidence of lymphadenopathy or soft tissue metastatic disease in chest abdomen pelvis.  Prostamegaly.  Enlargement of main pulmonary artery.  Coronary artery disease.   02/21/2021 Genetic Testing   [employer]  Multi-Cancer+RNA cancer panel found a single, pathogenic variant in BRCA2 called [id number]. The remainder of testing was negative/normal.      03/11/2021 Imaging   , bone scan showed improved/decreased uptake in T6 and L2   04/27/2021 Imaging   Bone scan bone scan showed foci of abnormal uptake noted in the mid thoracic and upper lumbar spine concerning for metastatic disease   12/07/2021 Imaging   CT chest abdomen pelvis wo contrast 1. Mild prostatomegaly without discrete mass appreciated by CT. 2. Unchanged sclerotic osseous metastatic disease, for example of the L1 and L2 vertebral bodies. 3. Unchanged, focally severe disc degenerative disease at T6-T7 with sclerosis of the vertebral bodies, most likely degenerative change although underlying osseous metastasis again not excluded. Continued attention on follow-up. 4. No noncontrast evidence of lymphadenopathy or soft tissue metastatic disease to the chest, abdomen, or pelvis. 5. Enlargement of the main pulmonary artery, as can be seen in pulmonary hypertension. 6. Coronary artery disease.   12/07/2021 Imaging   Bone scan showed 1. There is faint activity in the upper lumbar region, approximate L2 level, likely corresponds to faint sclerosis/metastasis on CT 2. New focus of activity at the left inferior sternum and right sixth rib anteriorly, possibly metastatic but no definitive corresponding lesion on CT. 3. New prominent activity at the left first rib end/sternoclavicular junction potentially degenerative, given appearance on CT. 4. Probable degenerative uptake at the bilateral feet, shoulders,and midthoracic spine   #06/11/2020 patient went to [facility] for second opinion was seen by [name] who agrees with continuing ADT with switch to Eligard 45 mg.  He is due to proceed with the treatments today.  [facility]icated with [facility] [name], patient will receive treatment at [facility]. [name] also recommends to start darolutamide  and authorization  process has been initiated at [facility]. #07/06/2020 started darolutamide  600 mg twice daily # definitive treatment for prostate and pelvic node- Discussed with [facility] [name] and I appreciate his expert opinion of darolutamide  concurrent use with RT and ADT  INTERVAL HISTORY MUNG RINKER is a 78 y.o. male who has above history reviewed by me today presents for follow up visit for management of metastatic prostate cancer Patient was accompanied by his brother.  He is currently on Olarparib 250mg  twice daily He denies any new bone pain.  Chronic back pain unchanged. Accompanied by brother + lower extremity weakness, decreased mobility, chronic issue for him.   Review of Systems  Constitutional:  Positive for fatigue. Negative for appetite change, chills, fever and unexpected weight change.  HENT:   Negative for hearing loss and voice change.   Eyes:  Negative for eye problems and icterus.  Respiratory:  Negative for chest tightness, cough and shortness of breath.   Cardiovascular:  Negative for chest pain and leg swelling.  Gastrointestinal:  Negative for abdominal distention and abdominal pain.  Endocrine: Negative for hot flashes.  Genitourinary:  Positive for dyspareunia. Negative for difficulty urinating, dysuria and frequency.   Musculoskeletal:  Positive for back pain. Negative for arthralgias.  Skin:  Negative for itching and rash.  Neurological:  Positive for extremity weakness. Negative for light-headedness and numbness.  Hematological:  Negative for adenopathy. Does not bruise/bleed easily.    MEDICAL HISTORY:  Past Medical History:  Diagnosis Date   Cardiomyopathy (HCC)    Congestive heart failure (HCC)    Diabetes mellitus type 2, controlled (HCC)    Elevated lipids    Family history of breast cancer    Family history of colon cancer    HTN (hypertension)     SURGICAL HISTORY: Past Surgical History:  Procedure Laterality Date   CARDIAC CATHETERIZATION N/A 04/14/2015    Procedure: Left Heart Cath and Coronary Angiography;  Surgeon: Marsa Dooms, MD;  Location: [facility] INVASIVE CV LAB;  Service: Cardiovascular;  Laterality: N/A;   CATARACT EXTRACTION W/ INTRAOCULAR LENS  IMPLANT, BILATERAL Left    COLONOSCOPY WITH PROPOFOL  N/A 04/08/2020   Procedure: COLONOSCOPY WITH PROPOFOL ;  Surgeon: Toledo, Ladell POUR, MD;  Location: [facility] ENDOSCOPY;  Service: Gastroenterology;  Laterality: N/A;   CYSTOSCOPY W/ RETROGRADES Bilateral 07/02/2021   Procedure: CYSTOSCOPY WITH RETROGRADE PYELOGRAM;  Surgeon: Francisca Redell BROCKS, MD;  Location: [facility] ORS;  Service: Urology;  Laterality: Bilateral;   HERNIA REPAIR     IMPLANTABLE CARDIOVERTER DEFIBRILLATOR (ICD) GENERATOR CHANGE Left 07/03/2015   Procedure: ICD IMPLANT single chamber;  Surgeon: Franky LITTIE Ned, MD;  Location: [facility] ORS;  Service: Cardiovascular;  Laterality: Left;   TRANSURETHRAL RESECTION OF BLADDER TUMOR N/A 07/02/2021   Procedure: TRANSURETHRAL RESECTION OF BLADDER TUMOR (TURBT);  Surgeon: Francisca Redell BROCKS, MD;  Location: [facility] ORS;  Service: Urology;  Laterality: N/A;    SOCIAL HISTORY: Social History   Socioeconomic History   Marital status: Single    Spouse name: Not on file   Number of children: Not on file   Years of education: Not on file   Highest education level: Not on file  Occupational History   Not on file  Tobacco Use   Smoking status: Former    Current packs/day: 0.00    Average packs/day: 0.3 packs/day for 30.0 years (7.5 ttl pk-yrs)    Types: Cigarettes    Start date: 02/10/1990    Quit date: 02/11/2020    Years since quitting: 3.0    Passive exposure: Past   Smokeless tobacco: Never  Vaping Use   Vaping status: Never Used  Substance and Sexual Activity   Alcohol use: No   Drug use: Not Currently    Frequency: 2.0 times per week    Types: Marijuana    Comment: past use   Sexual activity: Not on file  Other Topics Concern   Not on file  Social History Narrative   Not on file    Social Drivers of Health   Financial Resource Strain: Not on file  Food Insecurity: No Food Insecurity (11/26/2022)   Hunger Vital Sign    Worried About Running Out of Food in the Last Year: Never true    Ran Out of Food in the Last Year: Never true  Transportation Needs: No Transportation Needs (11/26/2022)   PRAPARE - Administrator, Civil Service (Medical): No    Lack of Transportation (Non-Medical): No  Physical Activity: Not on file  Stress: Not on file  Social Connections: Not on file  Intimate Partner Violence: Not At Risk (11/26/2022)   Humiliation, Afraid, Rape, and Kick questionnaire    Fear of Current or Ex-Partner: No    Emotionally Abused: No    Physically Abused: No    Sexually Abused: No  FAMILY HISTORY: Family History  Problem Relation Age of Onset   Heart failure Mother    Heart disease Mother    Alcoholism Father    Hypertension Sister    Diabetes Sister    Hypertension Brother    Diabetes Brother    Colon cancer Brother        vs prostate   Multiple myeloma Brother    Breast cancer Cousin     ALLERGIES:  has no known allergies.  MEDICATIONS:  Current Outpatient Medications  Medication Sig Dispense Refill   acetaminophen  (TYLENOL ) 325 MG tablet Take 2 tablets (650 mg total) by mouth every 4 (four) hours as needed for mild pain (or temp > 37.5 C (99.5 F)).     acetaminophen  (TYLENOL ) 650 MG CR tablet Take 1,300 mg by mouth 2 (two) times daily.     ASPIRIN  LOW DOSE 81 MG tablet Take 81 mg by mouth daily.     carvedilol  (COREG ) 3.125 MG tablet Take 3.125 mg by mouth 2 (two) times daily.     cholecalciferol  (VITAMIN D3) 25 MCG (1000 UNIT) tablet Take 1,000 Units by mouth daily.     GLUMETZA  1000 MG 24 hr tablet Take 1,000 mg by mouth daily.     LEUPROLIDE ACETATE, 6 MONTH, 45 MG injection Inject into the skin.     LYNPARZA  100 MG tablet Take 100 mg by mouth 2 (two) times daily.     LYNPARZA  150 MG tablet Take 150 mg by mouth 2 (two) times  daily.     magnesium  oxide (MAG-OX) 400 (240 Mg) MG tablet Take 1 tablet by mouth daily.     metFORMIN  (GLUCOPHAGE -XR) 500 MG 24 hr tablet Take 500 mg by mouth daily with breakfast.     Multiple Vitamin (ONE-DAILY MULTI-VITAMIN) TABS Take 1 tablet by mouth daily.     Omega-3 Fatty Acids (OMEGA 3 PO) Take 1 capsule by mouth 2 (two) times daily.     omeprazole  (PRILOSEC) 10 MG capsule Take 10 mg by mouth daily.     senna (SENOKOT) 8.6 MG TABS tablet Take 2 tablets by mouth daily.     spironolactone  (ALDACTONE ) 25 MG tablet Take 0.5 tablets (12.5 mg total) by mouth daily.     tamsulosin  (FLOMAX ) 0.4 MG CAPS capsule Take 1 capsule (0.4 mg total) by mouth daily. 30 capsule 2   No current facility-administered medications for this visit.     PHYSICAL EXAMINATION: ECOG PERFORMANCE STATUS: 1 - Symptomatic but completely ambulatory Vitals:   03/14/23 1015  BP: 130/83  Pulse: 92  Resp: 18  Temp: 98.1 F (36.7 C)  SpO2: 100%   Filed Weights   03/14/23 1015  Weight: 178 lb (80.7 kg)    Physical Exam Constitutional:      General: He is not in acute distress. HENT:     Head: Normocephalic.  Eyes:     General: No scleral icterus. Cardiovascular:     Rate and Rhythm: Normal rate.  Pulmonary:     Effort: Pulmonary effort is normal. No respiratory distress.  Abdominal:     General: Bowel sounds are normal. There is no distension.     Palpations: Abdomen is soft.  Musculoskeletal:        General: No swelling.     Cervical back: Normal range of motion and neck supple.  Skin:    General: Skin is warm.     Findings: No rash.  Neurological:     Mental Status: He is alert. Mental status  is at baseline.     Cranial Nerves: No cranial nerve deficit.     Comments: Oriented x 1  Psychiatric:        Mood and Affect: Mood normal.     LABORATORY DATA:  I have reviewed the data as listed    Latest Ref Rng & Units 03/13/2023    9:37 AM 01/16/2023    7:12 PM 12/05/2022   10:39 AM  CBC   WBC 4.0 - 10.5 K/uL 3.7  3.1  3.0   Hemoglobin 13.0 - 17.0 g/dL 89.5  89.4  89.8   Hematocrit 39.0 - 52.0 % 32.5  32.3  32.1   Platelets 150 - 400 K/uL 205  189  189       Latest Ref Rng & Units 03/13/2023    9:36 AM 01/16/2023    7:12 PM 12/05/2022   10:39 AM  CMP  Glucose 70 - 99 mg/dL 734  807  770   BUN 8 - 23 mg/dL 24  19  15    Creatinine 0.61 - 1.24 mg/dL 9.19  9.00  9.19   Sodium 135 - 145 mmol/L 138  137  137   Potassium 3.5 - 5.1 mmol/L 4.0  3.7  3.9   Chloride 98 - 111 mmol/L 104  106  105   CO2 22 - 32 mmol/L 24  21  24    Calcium 8.9 - 10.3 mg/dL 9.3  9.0  9.1   Total Protein 6.5 - 8.1 g/dL 6.8  6.8  6.9   Total Bilirubin 0.0 - 1.2 mg/dL 0.6  0.6  0.3   Alkaline Phos 38 - 126 U/L 71  54  64   AST 15 - 41 U/L 16  18  17    ALT 0 - 44 U/L 12  12  14      Iron /TIBC/Ferritin/ %Sat    Component Value Date/Time   IRON  80 12/05/2022 1039   TIBC 347 12/05/2022 1039   FERRITIN 57 12/05/2022 1039   IRONPCTSAT 23 12/05/2022 1039     RADIOGRAPHIC STUDIES: I have personally reviewed the radiological images as listed and agreed with the findings in the report. DG Ankle Complete Right Result Date: 01/17/2023 CLINICAL DATA:  Clemens yesterday.  Ankle pain. EXAM: RIGHT ANKLE - COMPLETE 3+ VIEW COMPARISON:  None Available. FINDINGS: Bones are somewhat osteopenic. Arterial calcification is present. No identifiable acute fracture. IMPRESSION: No acute or traumatic finding. Osteopenia. Arterial calcification. Electronically Signed   By: Oneil Officer M.D.   On: 01/17/2023 16:23   CT HEAD WO CONTRAST ( ) Result Date: 01/17/2023 CLINICAL DATA:  Head trauma, minor (Age >= 65y) , pt reports multiple falls and increased weakness over the past the week. Prostate cancer. EXAM: CT HEAD WITHOUT CONTRAST TECHNIQUE: Contiguous axial images were obtained from the base of the skull through the vertex without intravenous contrast. RADIATION DOSE REDUCTION: This exam was performed according to the  departmental dose-optimization program which includes automated exposure control, adjustment of the mA and/or kV according to patient size and/or use of iterative reconstruction technique. COMPARISON:  MRI head 11/28/2022 FINDINGS: Brain: Cerebral ventricle sizes are concordant with the degree of cerebral volume loss. Patchy and confluent areas of decreased attenuation are noted throughout the deep and periventricular white matter of the cerebral hemispheres bilaterally, compatible with chronic microvascular ischemic disease. No evidence of large-territorial acute infarction. No parenchymal hemorrhage. No mass lesion. No extra-axial collection. Nonspecific bilateral basal ganglia mineralization. No mass effect or midline shift. No hydrocephalus. Basilar  cisterns are patent. Vascular: No hyperdense vessel. Atherosclerotic calcifications are present within the cavernous internal carotid and vertebral arteries. Skull: No acute fracture or focal lesion. Sinuses/Orbits: Paranasal sinuses and mastoid air cells are clear. Left lens replacement. Otherwise the orbits are unremarkable. Other: None. IMPRESSION: No acute intracranial abnormality. Electronically Signed   By: Morgane  Naveau M.D.   On: 01/17/2023 02:01   VAS US  AAA DUPLEX Result Date: 01/09/2023 ABDOMINAL AORTA STUDY Patient Name:  KYRUS HYDE  Date of Exam:   01/06/2023 Medical Rec #: [id number]       Accession #:    [id number] Date of Birth: [birth year]       Patient Gender: M Patient Age:   65 years Exam Location:  [facility] Procedure:      VAS US  AAA DUPLEX Referring Phys: SELINDA GU --------------------------------------------------------------------------------  Indications: Bilateral CIA Dilatation Rt> Lt  Comparison Study: 01/04/2022 Performing Technologist: Leafy Gibes RVS  Examination Guidelines: A complete evaluation includes B-mode imaging, spectral Doppler, color Doppler, and power Doppler as needed of all accessible portions of  each vessel. Bilateral testing is considered an integral part of a complete examination. Limited examinations for reoccurring indications may be performed as noted.  Abdominal Aorta Findings: +-----------+-------+----------+----------+----------+--------+--------+ Location   AP (cm)Trans (cm)PSV (cm/s)Waveform  ThrombusComments +-----------+-------+----------+----------+----------+--------+--------+ Proximal   1.86   2.23      55        monophasic                 +-----------+-------+----------+----------+----------+--------+--------+ Mid        1.91   1.92      68        monophasic                 +-----------+-------+----------+----------+----------+--------+--------+ Distal     2.00   2.31      61        monophasic                 +-----------+-------+----------+----------+----------+--------+--------+ RT CIA Prox2.0    2.2       63        biphasic                   +-----------+-------+----------+----------+----------+--------+--------+ LT CIA Prox1.6    1.7       108       triphasic                  +-----------+-------+----------+----------+----------+--------+--------+  Summary: Abdominal Aorta: There is evidence of abnormal dilation of the Right Common Iliac artery. The largest aortic measurement is 2.3 cm. Bilateral CIA Dilatations Rt > L just as the prior study on 01/04/2022. The largest aortic diameter has decreased compared  to prior exam. Previous diameter measurement was 2.6 cm obtained on 01/04/2022.  *See table(s) above for measurements and observations.  Electronically signed by Selinda Gu MD on 01/09/2023 at 8:50:33 AM.    Final

## 2023-03-14 NOTE — Assessment & Plan Note (Addendum)
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 #NGS showed BRCA 2 S1404, BRIP1 R173C 3X, FOXO1 1S323fs. 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   PSA nadir 0.01. PMSA showed new bone lesions, s/p palliative radiation.  PSA 3.29 --> 4.72-->0.9 -->0.16-->0.17-->4 Labs are reviewed and discussed with patient. Eligard 45 mg Q6 months with [facility]- Continue Olarparib 250mg  BID [ dose reduced due to performance status].  [facility] medication [facility].  PSA has rapidly increased. Check testosterone  level and repeat PSA.  Obtain PSMA PET scan.

## 2023-03-15 LAB — TESTOSTERONE: Testosterone: 23 ng/dL — ABNORMAL LOW (ref 264–916)

## 2023-03-22 ENCOUNTER — Ambulatory Visit
Admission: RE | Admit: 2023-03-22 | Discharge: 2023-03-22 | Disposition: A | Discharge status: Home / Self Care | Payer: Medicare (Managed Care) | Source: Ambulatory Visit | Attending: Oncology | Admitting: Oncology

## 2023-03-22 DIAGNOSIS — E119 Type 2 diabetes mellitus without complications: Secondary | ICD-10-CM | POA: Insufficient documentation

## 2023-03-22 DIAGNOSIS — C7951 Secondary malignant neoplasm of bone: Secondary | ICD-10-CM | POA: Diagnosis not present

## 2023-03-22 DIAGNOSIS — C61 Malignant neoplasm of prostate: Secondary | ICD-10-CM | POA: Diagnosis present

## 2023-03-22 DIAGNOSIS — I700 Atherosclerosis of aorta: Secondary | ICD-10-CM | POA: Diagnosis not present

## 2023-03-22 MED ORDER — FLOTUFOLASTAT F 18 GALLIUM 296-5846 MBQ/ML IV SOLN
9.0000 | Freq: Once | INTRAVENOUS | Status: AC
  Administered 2023-03-22: 8.34 via INTRAVENOUS
  Filled 2023-03-22: qty 9

## 2023-03-30 ENCOUNTER — Inpatient Hospital Stay (HOSPITAL_BASED_OUTPATIENT_CLINIC_OR_DEPARTMENT_OTHER): Payer: Medicare (Managed Care) | Admitting: Oncology

## 2023-03-30 ENCOUNTER — Encounter: Payer: Self-pay | Admitting: Oncology

## 2023-03-30 ENCOUNTER — Telehealth: Payer: Self-pay

## 2023-03-30 VITALS — BP 132/74 | HR 93 | Temp 97.9°F | Resp 18 | Wt 187.0 lb

## 2023-03-30 DIAGNOSIS — C7951 Secondary malignant neoplasm of bone: Secondary | ICD-10-CM | POA: Diagnosis not present

## 2023-03-30 DIAGNOSIS — D649 Anemia, unspecified: Secondary | ICD-10-CM

## 2023-03-30 DIAGNOSIS — C61 Malignant neoplasm of prostate: Secondary | ICD-10-CM

## 2023-03-30 NOTE — Progress Notes (Signed)
Hematology/Oncology Progress note Telephone:([phone] Fax:([phone]     CHIEF COMPLAINTS/REASON FOR VISIT:  metastatic prostate cancer   ASSESSMENT & PLAN:   Cancer Staging  Prostate cancer [facility]) Staging form: Prostate, AJCC 8th Edition - Clinical stage from 05/11/2020: Stage IVB (cTX, cN0, cM1, PSA: 30) - Signed by [name], MD on 05/11/2020   Prostate cancer [facility]) #Metastatic prostate cancer to bone, castration resistance disease. #NGS showed BRCA 2 S1404, BRIP1 R173C 3X, FOXO1 1S356fs. 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   PSA nadir 0.01. PMSA showed new bone lesions, s/p palliative radiation.  PSA 3.29 --> 4.72-->0.9 -->0.16-->0.17-->4.97 Labs are reviewed and discussed with patient. Eligard 45 mg Q6 months [facility]- Continue Olarparib 250mg  BID [ dose reduced due to performance status].  Patient will get medication through [facility].  Will confirm with [facility] about his last dose of Eligard. Testosterone level <50.  PSMA PET showed multiple bone metastatic disease.- disease progression.  I had a lenghty discussion with patient and her sister regarding future options.  He has bone only metastatic disease. Sister expresses desire of patient to continue necessary treatments to prolong patient's life.  Pluvicto [preferred option] vs Radiam 223 treatments options were discussed.  Will check with [facility] for approval of  any of these treatments    Anemia Slight decreased hemoglobin.  Likely due to marrow side effects due to Olarparib.  Normal  iron tibc ferritin, folate, b12  Metastasis to bone [facility]) S/p palliative RT Sister and patient previously declined bone strengthening treatment. See above   No orders of the defined types were placed in this encounter.  Follow up TBD All questions were answered. The patient knows to call the clinic with any problems, questions or  concerns.  [name], MD, PhD [facility] Hematology Oncology 03/30/2023    HISTORY OF PRESENTING ILLNESS:   Matthew Mcgrath is a  78 y.o.  male presents for metastatic prostate cancer. Oncology History  Prostate cancer (HCC)  04/16/2020 Procedure   Firmagon 240 mg loading dose   04/21/2020 Imaging   CT abdomen pelvis showed a sclerotic lesion in the lumbar spine concerning for probable metastatic disease.  No definitive extra skeletal metastatic disease noted elsewhere in the abdomen or pelvis.  Mild circumferential bladder wall thickening.  Aortic atherosclerosis.   05/11/2020 Initial Diagnosis   Prostate cancer   04/01/2020, prostate biopsy showed  All cores were positive for high risk prostate cancer, primarily Gleason score 5+4 = 9, with max core involvement of 100% and perineural invasion present.   05/11/2020 Cancer Staging   Staging form: Prostate, AJCC 8th Edition - Clinical stage from 05/11/2020: Stage IVB (cTX, cN0, cM1, PSA: 30) - Signed by [name], MD on 05/11/2020 Prostate specific antigen (PSA) range: 20 or greater   10/19/2020 -  Radiation Therapy   finished definitive RT. to prostate cancer and pelvic lymph node treatments.   02/13/2021 Imaging   CT chest abdomen pelvis without contrast showed slight interval increase in sclerosis of previously osseous metastasis involving lumbar spine, most consistent with posttreatment sclerosis.  Focally severe disc degeneration and sclerosis versus sclerotic osseous metastatic of T6 and 7.  Attention on follow-up.  No noncontrast evidence of lymphadenopathy or soft tissue metastatic disease in chest abdomen pelvis.  Prostamegaly.  Enlargement of main pulmonary artery.  Coronary artery disease.   02/21/2021 Genetic Testing   [employer] Multi-Cancer+RNA cancer  panel found a single, pathogenic variant in BRCA2 called [id number]. The remainder of testing was negative/normal.      03/11/2021 Imaging   , bone scan showed improved/decreased uptake in  T6 and L2   04/27/2021 Imaging   Bone scan bone scan showed foci of abnormal uptake noted in the mid thoracic and upper lumbar spine concerning for metastatic disease   12/07/2021 Imaging   CT chest abdomen pelvis wo contrast 1. Mild prostatomegaly without discrete mass appreciated by CT. 2. Unchanged sclerotic osseous metastatic disease, for example of the L1 and L2 vertebral bodies. 3. Unchanged, focally severe disc degenerative disease at T6-T7 with sclerosis of the vertebral bodies, most likely degenerative change although underlying osseous metastasis again not excluded. Continued attention on follow-up. 4. No noncontrast evidence of lymphadenopathy or soft tissue metastatic disease to the chest, abdomen, or pelvis. 5. Enlargement of the main pulmonary artery, as can be seen in pulmonary hypertension. 6. Coronary artery disease.   12/07/2021 Imaging   Bone scan showed 1. There is faint activity in the upper lumbar region, approximate L2 level, likely corresponds to faint sclerosis/metastasis on CT 2. New focus of activity at the left inferior sternum and right sixth rib anteriorly, possibly metastatic but no definitive corresponding lesion on CT. 3. New prominent activity at the left first rib end/sternoclavicular junction potentially degenerative, given appearance on CT. 4. Probable degenerative uptake at the bilateral feet, shoulders,and midthoracic spine   #06/11/2020 patient went to [facility] for second opinion was seen by [name] who agrees with continuing ADT with switch to Eligard 45 mg.  He is due to proceed with the treatments today.  [facility]icated with [facility] [name], patient will receive treatment at [facility]. [name] also recommends to start darolutamide and authorization process has been initiated at [facility]. #07/06/2020 started darolutamide 600 mg twice daily # definitive treatment for prostate and pelvic node- Discussed with [facility] [name] and I appreciate his expert opinion of  darolutamide concurrent use with RT and ADT  INTERVAL HISTORY Matthew Mcgrath is a 78 y.o. male who has above history reviewed by me today presents for follow up visit for management of metastatic prostate cancer Patient was accompanied by his brother.  He is currently on Olarparib 250mg  twice daily He denies any new bone pain.  Chronic back pain unchanged. Accompanied by brother + lower extremity weakness, decreased mobility, chronic issue for him.   Review of Systems  Constitutional:  Positive for fatigue. Negative for appetite change, chills, fever and unexpected weight change.  HENT:   Negative for hearing loss and voice change.   Eyes:  Negative for eye problems and icterus.  Respiratory:  Negative for chest tightness, cough and shortness of breath.   Cardiovascular:  Negative for chest pain and leg swelling.  Gastrointestinal:  Negative for abdominal distention and abdominal pain.  Endocrine: Negative for hot flashes.  Genitourinary:  Positive for dyspareunia. Negative for difficulty urinating, dysuria and frequency.   Musculoskeletal:  Positive for back pain. Negative for arthralgias.  Skin:  Negative for itching and rash.  Neurological:  Positive for extremity weakness. Negative for light-headedness and numbness.  Hematological:  Negative for adenopathy. Does not bruise/bleed easily.    MEDICAL HISTORY:  Past Medical History:  Diagnosis Date   Cardiomyopathy (HCC)    Congestive heart failure (HCC)    Diabetes mellitus type 2, controlled (HCC)    Elevated lipids    Family history of breast cancer    Family history of colon cancer  HTN (hypertension)     SURGICAL HISTORY: Past Surgical History:  Procedure Laterality Date   CARDIAC CATHETERIZATION N/A 04/14/2015   Procedure: Left Heart Cath and Coronary Angiography;  Surgeon: Marcina Millard, MD;  Location: [facility] INVASIVE CV LAB;  Service: Cardiovascular;  Laterality: N/A;   CATARACT EXTRACTION W/ INTRAOCULAR LENS   IMPLANT, BILATERAL Left    COLONOSCOPY WITH PROPOFOL N/A 04/08/2020   Procedure: COLONOSCOPY WITH PROPOFOL;  Surgeon: Toledo, Boykin Nearing, MD;  Location: [facility] ENDOSCOPY;  Service: Gastroenterology;  Laterality: N/A;   CYSTOSCOPY W/ RETROGRADES Bilateral 07/02/2021   Procedure: CYSTOSCOPY WITH RETROGRADE PYELOGRAM;  Surgeon: Sondra Come, MD;  Location: [facility] ORS;  Service: Urology;  Laterality: Bilateral;   HERNIA REPAIR     IMPLANTABLE CARDIOVERTER DEFIBRILLATOR (ICD) GENERATOR CHANGE Left 07/03/2015   Procedure: ICD IMPLANT single chamber;  Surgeon: Sharion Settler, MD;  Location: [facility] ORS;  Service: Cardiovascular;  Laterality: Left;   TRANSURETHRAL RESECTION OF BLADDER TUMOR N/A 07/02/2021   Procedure: TRANSURETHRAL RESECTION OF BLADDER TUMOR (TURBT);  Surgeon: Sondra Come, MD;  Location: [facility] ORS;  Service: Urology;  Laterality: N/A;    SOCIAL HISTORY: Social History   Socioeconomic History   Marital status: Single    Spouse name: Not on file   Number of children: Not on file   Years of education: Not on file   Highest education level: Not on file  Occupational History   Not on file  Tobacco Use   Smoking status: Former    Current packs/day: 0.00    Average packs/day: 0.3 packs/day for 30.0 years (7.5 ttl pk-yrs)    Types: Cigarettes    Start date: 02/10/1990    Quit date: 02/11/2020    Years since quitting: 3.1    Passive exposure: Past   Smokeless tobacco: Never  Vaping Use   Vaping status: Never Used  Substance and Sexual Activity   Alcohol use: No   Drug use: Not Currently    Frequency: 2.0 times per week    Types: Marijuana    Comment: past use   Sexual activity: Not on file  Other Topics Concern   Not on file  Social History Narrative   Not on file   Social Drivers of Health   Financial Resource Strain: Not on file  Food Insecurity: No Food Insecurity (11/26/2022)   Hunger Vital Sign    Worried About Running Out of Food in the Last Year: Never true    Ran  Out of Food in the Last Year: Never true  Transportation Needs: No Transportation Needs (11/26/2022)   PRAPARE - Administrator, Civil Service (Medical): No    Lack of Transportation (Non-Medical): No  Physical Activity: Not on file  Stress: Not on file  Social Connections: Not on file  Intimate Partner Violence: Not At Risk (11/26/2022)   Humiliation, Afraid, Rape, and Kick questionnaire    Fear of Current or Ex-Partner: No    Emotionally Abused: No    Physically Abused: No    Sexually Abused: No    FAMILY HISTORY: Family History  Problem Relation Age of Onset   Heart failure Mother    Heart disease Mother    Alcoholism Father    Hypertension Sister    Diabetes Sister    Hypertension Brother    Diabetes Brother    Colon cancer Brother        vs prostate   Multiple myeloma Brother    Breast cancer Cousin  ALLERGIES:  has no known allergies.  MEDICATIONS:  Current Outpatient Medications  Medication Sig Dispense Refill   acetaminophen (TYLENOL) 325 MG tablet Take 2 tablets (650 mg total) by mouth every 4 (four) hours as needed for mild pain (or temp > 37.5 C (99.5 F)).     acetaminophen (TYLENOL) 650 MG CR tablet Take 1,300 mg by mouth 2 (two) times daily.     ASPIRIN LOW DOSE 81 MG tablet Take 81 mg by mouth daily.     carvedilol (COREG) 3.125 MG tablet Take 3.125 mg by mouth 2 (two) times daily.     cholecalciferol (VITAMIN D3) 25 MCG (1000 UNIT) tablet Take 1,000 Units by mouth daily.     GLUMETZA 1000 MG 24 hr tablet Take 1,000 mg by mouth daily.     LEUPROLIDE ACETATE, 6 MONTH, 45 MG injection Inject into the skin.     LYNPARZA 100 MG tablet Take 100 mg by mouth 2 (two) times daily.     LYNPARZA 150 MG tablet Take 150 mg by mouth 2 (two) times daily.     magnesium oxide (MAG-OX) 400 (240 Mg) MG tablet Take 1 tablet by mouth daily.     metFORMIN (GLUCOPHAGE-XR) 500 MG 24 hr tablet Take 500 mg by mouth daily with breakfast.     Multiple Vitamin (ONE-DAILY  MULTI-VITAMIN) TABS Take 1 tablet by mouth daily.     Omega-3 Fatty Acids (OMEGA 3 PO) Take 1 capsule by mouth 2 (two) times daily.     omeprazole (PRILOSEC) 10 MG capsule Take 10 mg by mouth daily.     senna (SENOKOT) 8.6 MG TABS tablet Take 2 tablets by mouth daily.     spironolactone (ALDACTONE) 25 MG tablet Take 0.5 tablets (12.5 mg total) by mouth daily.     tamsulosin (FLOMAX) 0.4 MG CAPS capsule Take 1 capsule (0.4 mg total) by mouth daily. 30 capsule 2   No current facility-administered medications for this visit.     PHYSICAL EXAMINATION: ECOG PERFORMANCE STATUS: 1 - Symptomatic but completely ambulatory Vitals:   03/30/23 1006  BP: 132/74  Pulse: 93  Resp: 18  Temp: 97.9 F (36.6 C)  SpO2: 95%   Filed Weights   03/30/23 1006  Weight: 187 lb (84.8 kg)    Physical Exam Constitutional:      General: He is not in acute distress. HENT:     Head: Normocephalic.  Eyes:     General: No scleral icterus. Cardiovascular:     Rate and Rhythm: Normal rate.  Pulmonary:     Effort: Pulmonary effort is normal. No respiratory distress.  Abdominal:     General: Bowel sounds are normal. There is no distension.     Palpations: Abdomen is soft.  Musculoskeletal:        General: No swelling.     Cervical back: Normal range of motion and neck supple.  Skin:    General: Skin is warm.     Findings: No rash.  Neurological:     Mental Status: He is alert. Mental status is at baseline.     Cranial Nerves: No cranial nerve deficit.     Comments: Oriented x 1  Psychiatric:        Mood and Affect: Mood normal.     LABORATORY DATA:  I have reviewed the data as listed    Latest Ref Rng & Units 03/13/2023    9:37 AM 01/16/2023    7:12 PM 12/05/2022   10:39 AM  CBC  WBC 4.0 - 10.5 K/uL 3.7  3.1  3.0   Hemoglobin 13.0 - 17.0 g/dL 91.4  78.2  95.6   Hematocrit 39.0 - 52.0 % 32.5  32.3  32.1   Platelets 150 - 400 K/uL 205  189  189       Latest Ref Rng & Units 03/13/2023    9:36  AM 01/16/2023    7:12 PM 12/05/2022   10:39 AM  CMP  Glucose 70 - 99 mg/dL 213  086  578   BUN 8 - 23 mg/dL 24  19  15    Creatinine 0.61 - 1.24 mg/dL 4.69  6.29  5.28   Sodium 135 - 145 mmol/L 138  137  137   Potassium 3.5 - 5.1 mmol/L 4.0  3.7  3.9   Chloride 98 - 111 mmol/L 104  106  105   CO2 22 - 32 mmol/L 24  21  24    Calcium 8.9 - 10.3 mg/dL 9.3  9.0  9.1   Total Protein 6.5 - 8.1 g/dL 6.8  6.8  6.9   Total Bilirubin 0.0 - 1.2 mg/dL 0.6  0.6  0.3   Alkaline Phos 38 - 126 U/L 71  54  64   AST 15 - 41 U/L 16  18  17    ALT 0 - 44 U/L 12  12  14      Iron/TIBC/Ferritin/ %Sat    Component Value Date/Time   IRON 80 12/05/2022 1039   TIBC 347 12/05/2022 1039   FERRITIN 57 12/05/2022 1039   IRONPCTSAT 23 12/05/2022 1039     RADIOGRAPHIC STUDIES: I have personally reviewed the radiological images as listed and agreed with the findings in the report. NM PET (PSMA) SKULL TO MID THIGH Result Date: 03/22/2023 CLINICAL DATA:  Prostate carcinoma with biochemical recurrence. Castrate resistant prostate carcinoma. Bone metastasis. Increasing PSA. EXAM: NUCLEAR MEDICINE PET SKULL BASE TO THIGH TECHNIQUE: 8.34 mCi Flotufolastat (Posluma) was injected intravenously. Full-ring PET imaging was performed from the skull base to thigh after the radiotracer. CT data was obtained and used for attenuation correction and anatomic localization. COMPARISON:  PSA PET scan 03/31/2022 FINDINGS: NECK No radiotracer activity in neck lymph nodes. Incidental CT finding: None. CHEST No radiotracer accumulation within mediastinal or hilar lymph nodes. No suspicious pulmonary nodules on the CT scan. Incidental CT finding: None. ABDOMEN/PELVIS Prostate: No focal activity in the prostate bed. Lymph nodes: No abnormal radiotracer accumulation within pelvic or abdominal nodes. Liver: No evidence of liver metastasis. Incidental CT finding: Atherosclerotic calcification of the aorta. Bladder is thick-walled. SKELETON Multiple  new radiotracer avid lesions within the bilateral ribs and consistent with new skeletal metastasis. Intense lesions in the RIGHT second third and fifth ribs (image 56 example). New lesions in the LEFT second rib. New lesion in the T5 vertebral body. New lesion in the L4 vertebral body. New lesions in the LEFT and RIGHT iliac bones. New lesions are intense with the RIGHT 5 th rib lesion with SUV max equal 21.9. RIGHT iliac lesion with SUV max equal 25. The previous described lesions in the sternum and thoracic spine have improved with minimal radiotracer activity. There is interval sclerosis at the sites. IMPRESSION: 1. Multiple new intense radiotracer avid skeletal metastasis involving the bilateral ribs, thoracic spine, lumbar spine and pelvis. 2. Improved radiotracer activity at the sternum and thoracic spine metastasis. 3. No evidence of visceral metastasis or nodal metastasis. 4. No evidence of local prostate carcinoma recurrence in the prostate bed. 5.  Aortic Atherosclerosis (ICD10-I70.0). Electronically Signed   By: Genevive Bi M.D.   On: 03/22/2023 14:54   DG Ankle Complete Right Result Date: 01/17/2023 CLINICAL DATA:  Larey Seat yesterday.  Ankle pain. EXAM: RIGHT ANKLE - COMPLETE 3+ VIEW COMPARISON:  None Available. FINDINGS: Bones are somewhat osteopenic. Arterial calcification is present. No identifiable acute fracture. IMPRESSION: No acute or traumatic finding. Osteopenia. Arterial calcification. Electronically Signed   By: Paulina Fusi M.D.   On: 01/17/2023 16:23   CT HEAD WO CONTRAST ( ) Result Date: 01/17/2023 CLINICAL DATA:  Head trauma, minor (Age >= 65y) , pt reports multiple falls and increased weakness over the past the week. Prostate cancer. EXAM: CT HEAD WITHOUT CONTRAST TECHNIQUE: Contiguous axial images were obtained from the base of the skull through the vertex without intravenous contrast. RADIATION DOSE REDUCTION: This exam was performed according to the departmental  dose-optimization program which includes automated exposure control, adjustment of the mA and/or kV according to patient size and/or use of iterative reconstruction technique. COMPARISON:  MRI head 11/28/2022 FINDINGS: Brain: Cerebral ventricle sizes are concordant with the degree of cerebral volume loss. Patchy and confluent areas of decreased attenuation are noted throughout the deep and periventricular white matter of the cerebral hemispheres bilaterally, compatible with chronic microvascular ischemic disease. No evidence of large-territorial acute infarction. No parenchymal hemorrhage. No mass lesion. No extra-axial collection. Nonspecific bilateral basal ganglia mineralization. No mass effect or midline shift. No hydrocephalus. Basilar cisterns are patent. Vascular: No hyperdense vessel. Atherosclerotic calcifications are present within the cavernous internal carotid and vertebral arteries. Skull: No acute fracture or focal lesion. Sinuses/Orbits: Paranasal sinuses and mastoid air cells are clear. Left lens replacement. Otherwise the orbits are unremarkable. Other: None. IMPRESSION: No acute intracranial abnormality. Electronically Signed   By: Tish Frederickson M.D.   On: 01/17/2023 02:01   VAS [place] AAA DUPLEX Result Date: 01/09/2023 ABDOMINAL AORTA STUDY Patient Name:  YOSHIKI RAICHE  Date of Exam:   01/06/2023 Medical Rec #: [id number]       Accession #:    [id number] Date of Birth: August 19, 1945       Patient Gender: M Patient Age:   89 years Exam Location:  [facility] Procedure:      VAS [place] AAA DUPLEX Referring Phys: Festus Barren --------------------------------------------------------------------------------  Indications: Bilateral CIA Dilatation Rt> Lt  Comparison Study: 01/04/2022 Performing Technologist: Debbe Bales RVS  Examination Guidelines: A complete evaluation includes B-mode imaging, spectral Doppler, color Doppler, and power Doppler as needed of all accessible portions of each vessel.  Bilateral testing is considered an integral part of a complete examination. Limited examinations for reoccurring indications may be performed as noted.  Abdominal Aorta Findings: +-----------+-------+----------+----------+----------+--------+--------+ Location   AP (cm)Trans (cm)PSV (cm/s)Waveform  ThrombusComments +-----------+-------+----------+----------+----------+--------+--------+ Proximal   1.86   2.23      55        monophasic                 +-----------+-------+----------+----------+----------+--------+--------+ Mid        1.91   1.92      68        monophasic                 +-----------+-------+----------+----------+----------+--------+--------+ Distal     2.00   2.31      61        monophasic                 +-----------+-------+----------+----------+----------+--------+--------+ RT CIA Prox2.0    2.2  63        biphasic                   +-----------+-------+----------+----------+----------+--------+--------+ LT CIA Prox1.6    1.7       108       triphasic                  +-----------+-------+----------+----------+----------+--------+--------+  Summary: Abdominal Aorta: There is evidence of abnormal dilation of the Right Common Iliac artery. The largest aortic measurement is 2.3 cm. Bilateral CIA Dilatations Rt > L just as the prior study on 01/04/2022. The largest aortic diameter has decreased compared  to prior exam. Previous diameter measurement was 2.6 cm obtained on 01/04/2022.  *See table(s) above for measurements and observations.  Electronically signed by Festus Barren MD on 01/09/2023 at 8:50:33 AM.    Final

## 2023-03-30 NOTE — Telephone Encounter (Signed)
Community message sent to Dr. Arta Silence at [facility] to verify when patient got last dose of Eligard and to ask if [facility] will approve Pluvicto treatments or Radium 223 treatments.

## 2023-03-30 NOTE — Assessment & Plan Note (Addendum)
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 #NGS showed BRCA 2 S1404, BRIP1 R173C 3X, FOXO1 1S332fs. 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   PSA nadir 0.01. PMSA showed new bone lesions, s/p palliative radiation.  PSA 3.29 --> 4.72-->0.9 -->0.16-->0.17-->4.97 Labs are reviewed and discussed with patient. Eligard 45 mg Q6 months [facility]- Continue Olarparib 250mg  BID [ dose reduced due to performance status].  [facility] will [facility] medication [facility].  Will confirm with [facility] about his last dose of Eligard. Testosterone level <50.  PSMA PET showed multiple bone metastatic disease.- disease progression.  I had a lenghty discussion with patient and her sister regarding future options.  He has bone only metastatic disease. Sister expresses desire of patient to continue necessary treatments to prolong patient's life.  Pluvicto [preferred option] vs Radiam 223 treatments options were discussed.  Will check with [facility] for approval of  any of these treatments

## 2023-03-30 NOTE — Assessment & Plan Note (Addendum)
S/p palliative RT Sister and patient previously declined bone strengthening treatment. See above

## 2023-03-30 NOTE — Assessment & Plan Note (Signed)
Slight decreased hemoglobin.  Likely due to marrow side effects due to Olarparib.  Normal  iron tibc ferritin, folate, b12

## 2023-03-31 ENCOUNTER — Encounter: Payer: Self-pay | Admitting: Oncology

## 2023-03-31 NOTE — Telephone Encounter (Signed)
Dr. Cathie Hoops has reached out to Dr. Arta Silence via phone regarding pt.

## 2023-03-31 NOTE — Telephone Encounter (Signed)
Called [facility] and spoke to [facility] who confirmed that patient received Eligard in October and is due again in March. She transferred the call to Med records to inquire about treatments, but had to leave a VM.

## 2023-04-04 ENCOUNTER — Other Ambulatory Visit: Payer: Self-pay

## 2023-04-04 ENCOUNTER — Telehealth: Payer: Self-pay

## 2023-04-04 DIAGNOSIS — C61 Malignant neoplasm of prostate: Secondary | ICD-10-CM

## 2023-04-04 NOTE — Telephone Encounter (Signed)
Referral has been placed for Pluvicto treatment. IR team has reached out to Dr. Arta Silence with appt details. Pt has been scheduled with Dr.Edmunds

## 2023-04-06 ENCOUNTER — Encounter (HOSPITAL_COMMUNITY): Payer: Medicare (Managed Care)

## 2023-04-07 ENCOUNTER — Encounter: Payer: Self-pay | Admitting: Emergency Medicine

## 2023-04-07 ENCOUNTER — Inpatient Hospital Stay
Admission: EM | Admit: 2023-04-07 | Discharge: 2023-04-11 | DRG: 175 | Disposition: A | Discharge status: Skilled Nursing Facility | Payer: Medicare (Managed Care) | Attending: Internal Medicine | Admitting: Internal Medicine

## 2023-04-07 ENCOUNTER — Other Ambulatory Visit: Payer: Self-pay

## 2023-04-07 ENCOUNTER — Emergency Department: Payer: Medicare (Managed Care)

## 2023-04-07 ENCOUNTER — Inpatient Hospital Stay: Payer: Medicare (Managed Care)

## 2023-04-07 DIAGNOSIS — I2699 Other pulmonary embolism without acute cor pulmonale: Principal | ICD-10-CM | POA: Diagnosis present

## 2023-04-07 DIAGNOSIS — G9341 Metabolic encephalopathy: Secondary | ICD-10-CM | POA: Diagnosis present

## 2023-04-07 DIAGNOSIS — Z7984 Long term (current) use of oral hypoglycemic drugs: Secondary | ICD-10-CM | POA: Diagnosis not present

## 2023-04-07 DIAGNOSIS — Z7901 Long term (current) use of anticoagulants: Secondary | ICD-10-CM | POA: Diagnosis not present

## 2023-04-07 DIAGNOSIS — Z807 Family history of other malignant neoplasms of lymphoid, hematopoietic and related tissues: Secondary | ICD-10-CM

## 2023-04-07 DIAGNOSIS — E872 Acidosis, unspecified: Secondary | ICD-10-CM | POA: Diagnosis present

## 2023-04-07 DIAGNOSIS — E1165 Type 2 diabetes mellitus with hyperglycemia: Secondary | ICD-10-CM | POA: Diagnosis present

## 2023-04-07 DIAGNOSIS — I4892 Unspecified atrial flutter: Secondary | ICD-10-CM | POA: Diagnosis present

## 2023-04-07 DIAGNOSIS — I472 Ventricular tachycardia, unspecified: Secondary | ICD-10-CM | POA: Diagnosis present

## 2023-04-07 DIAGNOSIS — Z8249 Family history of ischemic heart disease and other diseases of the circulatory system: Secondary | ICD-10-CM

## 2023-04-07 DIAGNOSIS — I5A Non-ischemic myocardial injury (non-traumatic): Secondary | ICD-10-CM | POA: Diagnosis present

## 2023-04-07 DIAGNOSIS — I82411 Acute embolism and thrombosis of right femoral vein: Secondary | ICD-10-CM | POA: Diagnosis present

## 2023-04-07 DIAGNOSIS — Z87891 Personal history of nicotine dependence: Secondary | ICD-10-CM | POA: Diagnosis not present

## 2023-04-07 DIAGNOSIS — Z1152 Encounter for screening for COVID-19: Secondary | ICD-10-CM | POA: Diagnosis not present

## 2023-04-07 DIAGNOSIS — I110 Hypertensive heart disease with heart failure: Secondary | ICD-10-CM | POA: Diagnosis present

## 2023-04-07 DIAGNOSIS — R7989 Other specified abnormal findings of blood chemistry: Secondary | ICD-10-CM | POA: Diagnosis present

## 2023-04-07 DIAGNOSIS — Z8673 Personal history of transient ischemic attack (TIA), and cerebral infarction without residual deficits: Secondary | ICD-10-CM

## 2023-04-07 DIAGNOSIS — Z95810 Presence of automatic (implantable) cardiac defibrillator: Secondary | ICD-10-CM

## 2023-04-07 DIAGNOSIS — I2489 Other forms of acute ischemic heart disease: Secondary | ICD-10-CM | POA: Diagnosis present

## 2023-04-07 DIAGNOSIS — R4189 Other symptoms and signs involving cognitive functions and awareness: Secondary | ICD-10-CM | POA: Diagnosis present

## 2023-04-07 DIAGNOSIS — I82409 Acute embolism and thrombosis of unspecified deep veins of unspecified lower extremity: Secondary | ICD-10-CM | POA: Diagnosis present

## 2023-04-07 DIAGNOSIS — J9601 Acute respiratory failure with hypoxia: Secondary | ICD-10-CM | POA: Diagnosis present

## 2023-04-07 DIAGNOSIS — Z833 Family history of diabetes mellitus: Secondary | ICD-10-CM

## 2023-04-07 DIAGNOSIS — Z6372 Alcoholism and drug addiction in family: Secondary | ICD-10-CM

## 2023-04-07 DIAGNOSIS — I10 Essential (primary) hypertension: Secondary | ICD-10-CM | POA: Diagnosis present

## 2023-04-07 DIAGNOSIS — Z7982 Long term (current) use of aspirin: Secondary | ICD-10-CM

## 2023-04-07 DIAGNOSIS — E785 Hyperlipidemia, unspecified: Secondary | ICD-10-CM | POA: Diagnosis present

## 2023-04-07 DIAGNOSIS — I639 Cerebral infarction, unspecified: Secondary | ICD-10-CM | POA: Diagnosis present

## 2023-04-07 DIAGNOSIS — I5022 Chronic systolic (congestive) heart failure: Secondary | ICD-10-CM | POA: Diagnosis present

## 2023-04-07 DIAGNOSIS — C7951 Secondary malignant neoplasm of bone: Secondary | ICD-10-CM | POA: Diagnosis present

## 2023-04-07 DIAGNOSIS — I429 Cardiomyopathy, unspecified: Secondary | ICD-10-CM | POA: Diagnosis present

## 2023-04-07 DIAGNOSIS — Z811 Family history of alcohol abuse and dependence: Secondary | ICD-10-CM

## 2023-04-07 DIAGNOSIS — Z993 Dependence on wheelchair: Secondary | ICD-10-CM

## 2023-04-07 DIAGNOSIS — Z79899 Other long term (current) drug therapy: Secondary | ICD-10-CM

## 2023-04-07 DIAGNOSIS — C61 Malignant neoplasm of prostate: Secondary | ICD-10-CM | POA: Diagnosis present

## 2023-04-07 DIAGNOSIS — Z800 Family history of malignant neoplasm of digestive organs: Secondary | ICD-10-CM

## 2023-04-07 DIAGNOSIS — Z803 Family history of malignant neoplasm of breast: Secondary | ICD-10-CM

## 2023-04-07 DIAGNOSIS — R0602 Shortness of breath: Secondary | ICD-10-CM | POA: Diagnosis present

## 2023-04-07 LAB — CBC WITH DIFFERENTIAL/PLATELET
Abs Immature Granulocytes: 0.02 10*3/uL (ref 0.00–0.07)
Basophils Absolute: 0 10*3/uL (ref 0.0–0.1)
Basophils Relative: 0 %
Eosinophils Absolute: 0 10*3/uL (ref 0.0–0.5)
Eosinophils Relative: 1 %
HCT: 32.8 % — ABNORMAL LOW (ref 39.0–52.0)
Hemoglobin: 10.3 g/dL — ABNORMAL LOW (ref 13.0–17.0)
Immature Granulocytes: 0 %
Lymphocytes Relative: 5 %
Lymphs Abs: 0.3 10*3/uL — ABNORMAL LOW (ref 0.7–4.0)
MCH: 31.5 pg (ref 26.0–34.0)
MCHC: 31.4 g/dL (ref 30.0–36.0)
MCV: 100.3 fL — ABNORMAL HIGH (ref 80.0–100.0)
Monocytes Absolute: 0.2 10*3/uL (ref 0.1–1.0)
Monocytes Relative: 4 %
Neutro Abs: 4.8 10*3/uL (ref 1.7–7.7)
Neutrophils Relative %: 90 %
Platelets: 180 10*3/uL (ref 150–400)
RBC: 3.27 MIL/uL — ABNORMAL LOW (ref 4.22–5.81)
RDW: 15.5 % (ref 11.5–15.5)
WBC: 5.3 10*3/uL (ref 4.0–10.5)
nRBC: 0.4 % — ABNORMAL HIGH (ref 0.0–0.2)

## 2023-04-07 LAB — LACTIC ACID, PLASMA
Lactic Acid, Venous: 4.6 mmol/L (ref 0.5–1.9)
Lactic Acid, Venous: 2.8 mmol/L (ref 0.5–1.9)

## 2023-04-07 LAB — COMPREHENSIVE METABOLIC PANEL
ALT: 10 U/L (ref 0–44)
AST: 21 U/L (ref 15–41)
Albumin: 3.5 g/dL (ref 3.5–5.0)
Alkaline Phosphatase: 101 U/L (ref 38–126)
Anion gap: 18 — ABNORMAL HIGH (ref 5–15)
BUN: 28 mg/dL — ABNORMAL HIGH (ref 8–23)
CO2: 20 mmol/L — ABNORMAL LOW (ref 22–32)
Calcium: 9.1 mg/dL (ref 8.9–10.3)
Chloride: 101 mmol/L (ref 98–111)
Creatinine, Ser: 1.16 mg/dL (ref 0.61–1.24)
GFR, Estimated: >60 mL/min (ref >60)
Glucose, Bld: 345 mg/dL — ABNORMAL HIGH (ref 70–99)
Potassium: 3.8 mmol/L (ref 3.5–5.1)
Sodium: 139 mmol/L (ref 135–145)
Total Bilirubin: 0.7 mg/dL (ref 0.0–1.2)
Total Protein: 7.1 g/dL (ref 6.5–8.1)

## 2023-04-07 LAB — MAGNESIUM: Magnesium: 1.6 mg/dL — ABNORMAL LOW (ref 1.7–2.4)

## 2023-04-07 LAB — BLOOD GAS, VENOUS
Acid-Base Excess: 0.7 mmol/L (ref 0.0–2.0)
Bicarbonate: 24 mmol/L (ref 20.0–28.0)
O2 Saturation: 91.8 %
Patient temperature: 37
pCO2, Ven: 33 mm[Hg] — ABNORMAL LOW (ref 44–60)
pH, Ven: 7.47 — ABNORMAL HIGH (ref 7.25–7.43)
pO2, Ven: 59 mm[Hg] — ABNORMAL HIGH (ref 32–45)

## 2023-04-07 LAB — LIPASE, BLOOD: Lipase: 33 U/L (ref 11–51)

## 2023-04-07 LAB — PHOSPHORUS: Phosphorus: 3.7 mg/dL (ref 2.5–4.6)

## 2023-04-07 LAB — BETA-HYDROXYBUTYRIC ACID: Beta-Hydroxybutyric Acid: 0.25 mmol/L (ref 0.05–0.27)

## 2023-04-07 LAB — T4, FREE: Free T4: 1.18 ng/dL — ABNORMAL HIGH (ref 0.61–1.12)

## 2023-04-07 LAB — TROPONIN I (HIGH SENSITIVITY)
Troponin I (High Sensitivity): 39 ng/L — ABNORMAL HIGH (ref <18)
Troponin I (High Sensitivity): 49 ng/L — ABNORMAL HIGH (ref <18)

## 2023-04-07 LAB — TSH: TSH: 1.565 u[IU]/mL (ref 0.350–4.500)

## 2023-04-07 LAB — CK: Total CK: 42 U/L — ABNORMAL LOW (ref 49–397)

## 2023-04-07 LAB — APTT: aPTT: 30 s (ref 24–36)

## 2023-04-07 LAB — BRAIN NATRIURETIC PEPTIDE: B Natriuretic Peptide: 299.4 pg/mL — ABNORMAL HIGH (ref 0.0–100.0)

## 2023-04-07 LAB — PROTIME-INR
INR: 1.1 (ref 0.8–1.2)
Prothrombin Time: 14.1 s (ref 11.4–15.2)

## 2023-04-07 LAB — RESP PANEL BY RT-PCR (RSV, FLU A&B, COVID)  RVPGX2
Influenza A by PCR: NEGATIVE
Influenza B by PCR: NEGATIVE
Resp Syncytial Virus by PCR: NEGATIVE
SARS Coronavirus 2 by RT PCR: NEGATIVE

## 2023-04-07 LAB — CBG MONITORING, ED: Glucose-Capillary: 306 mg/dL — ABNORMAL HIGH (ref 70–99)

## 2023-04-07 MED ORDER — DM-GUAIFENESIN ER 30-600 MG PO TB12: 1.0000 | ORAL_TABLET | Freq: Two times a day (BID) | ORAL | Status: DC | PRN

## 2023-04-07 MED ORDER — IOHEXOL 350 MG/ML SOLN
75.0000 mL | Freq: Once | INTRAVENOUS | Status: AC | PRN
  Administered 2023-04-07: 75 mL via INTRAVENOUS

## 2023-04-07 MED ORDER — MAGNESIUM SULFATE 2 GM/50ML IV SOLN
2.0000 g | Freq: Once | INTRAVENOUS | Status: AC
  Administered 2023-04-07: 2 g via INTRAVENOUS
  Filled 2023-04-07: qty 50

## 2023-04-07 MED ORDER — HEPARIN (PORCINE) 25000 UT/250ML-% IV SOLN
1250.0000 [IU]/h | INTRAVENOUS | Status: DC
  Administered 2023-04-07 – 2023-04-08 (×2): 1250 [IU]/h via INTRAVENOUS
  Filled 2023-04-07 (×2): qty 250

## 2023-04-07 MED ORDER — ACETAMINOPHEN 325 MG PO TABS: 650.0000 mg | ORAL_TABLET | Freq: Four times a day (QID) | ORAL | Status: DC | PRN

## 2023-04-07 MED ORDER — ALBUTEROL SULFATE (2.5 MG/3ML) 0.083% IN NEBU: 2.5000 mg | INHALATION_SOLUTION | RESPIRATORY_TRACT | Status: DC | PRN

## 2023-04-07 MED ORDER — INSULIN ASPART 100 UNIT/ML IJ SOLN
0.0000 [IU] | Freq: Three times a day (TID) | INTRAMUSCULAR | Status: DC
  Administered 2023-04-08: 7 [IU] via SUBCUTANEOUS
  Administered 2023-04-08: 1 [IU] via SUBCUTANEOUS
  Administered 2023-04-08: 5 [IU] via SUBCUTANEOUS
  Administered 2023-04-09: 7 [IU] via SUBCUTANEOUS
  Administered 2023-04-09: 2 [IU] via SUBCUTANEOUS
  Administered 2023-04-09: 9 [IU] via SUBCUTANEOUS
  Administered 2023-04-10 (×3): 5 [IU] via SUBCUTANEOUS
  Administered 2023-04-11: 9 [IU] via SUBCUTANEOUS
  Administered 2023-04-11: 3 [IU] via SUBCUTANEOUS
  Filled 2023-04-07 (×11): qty 1

## 2023-04-07 MED ORDER — INSULIN ASPART 100 UNIT/ML IJ SOLN
0.0000 [IU] | Freq: Every day | INTRAMUSCULAR | Status: DC
  Administered 2023-04-07 – 2023-04-09 (×3): 4 [IU] via SUBCUTANEOUS
  Filled 2023-04-07 (×3): qty 1

## 2023-04-07 MED ORDER — DIPHENHYDRAMINE HCL 50 MG/ML IJ SOLN: 12.5000 mg | Freq: Three times a day (TID) | INTRAMUSCULAR | Status: DC | PRN

## 2023-04-07 MED ORDER — SODIUM CHLORIDE 0.9 % IV BOLUS
500.0000 mL | Freq: Once | INTRAVENOUS | Status: AC
  Administered 2023-04-07: 500 mL via INTRAVENOUS

## 2023-04-07 MED ORDER — HYDRALAZINE HCL 20 MG/ML IJ SOLN: 5.0000 mg | INTRAMUSCULAR | Status: DC | PRN

## 2023-04-07 MED ORDER — HEPARIN BOLUS VIA INFUSION
4500.0000 [IU] | Freq: Once | INTRAVENOUS | Status: AC
  Administered 2023-04-07: 4500 [IU] via INTRAVENOUS
  Filled 2023-04-07: qty 4500

## 2023-04-07 NOTE — H&P (Signed)
History and Physical    Matthew Mcgrath:[id number] DOB: Apr 16, 1945 DOA: 04/07/2023  Referring MD/NP/PA:   PCP: [facility]   Patient coming from:  The patient is coming from home.     Chief Complaint: SOB and confusion  HPI: Matthew Mcgrath is a 78 y.o. 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  Per her sister at the bedside, patient has been confused since yesterday.  When I saw patient in ED, patient is confused, arousable, knows his own name, not orientated to place and time.  Patient moves all extremities normally.  No facial droop or slurred speech noted.  Patient has shortness of breath, no cough or chest pain.  No fever or chills.  No nausea, vomiting or diarrhea or abdominal pain.  No symptoms of UTI.  No recent long distance traveling.  Data reviewed independently and ED Course: pt was found to have WBC 5.3, GFR > 60, BNP 299, negative PCR for COVID, flu and RSV, troponin 39 --> 49, lactic acid 4.6, CK 42, INR 1.1, PTT 30.  Temperature 99.3, blood pressure 143/78, heart rate 124, RR 35, oxygen saturation 90% on room air, which improved to 96% on 2 L oxygen.  CT of head negative for acute intracranial abnormalities.  Patient is admitted to PCU as inpatient.  Dr. Wyn Quaker of [facility] is consulted.   CTA: 1. Positive examination for multiple bilateral pulmonary emboli. RV to LV ratio is 0.9, borderline for right heart strain. Clinical correlation recommended. 2. Known skeletal metastases are demonstrated. 3. Emphysematous changes in the lungs. 4. Aortic atherosclerosis   Lower extremity venous Doppler: 1. Occlusive DVT within the mid segment of the right femoral vein and nonocclusive DVT within the distal  segment. 2. No evidence of left lower extremity DVT.   EKG: I have personally reviewed.  Sinus rhythm, QTc 512, low voltage, nonspecific T wave change.   Review of Systems: Could not be reviewed accurately due to altered mental status.   Allergy: No Known Allergies  Past Medical History:  Diagnosis Date   Cardiomyopathy (HCC)    Congestive heart failure (HCC)    Diabetes mellitus type 2, controlled (HCC)    Elevated lipids    Family history of breast cancer    Family history of colon cancer    HTN (hypertension)     Past Surgical History:  Procedure Laterality Date   CARDIAC CATHETERIZATION N/A 04/14/2015   Procedure: Left Heart Cath and Coronary Angiography;  Surgeon: Marcina Millard, MD;  Location: [facility];  Service: Cardiovascular;  Laterality: N/A;   CATARACT EXTRACTION W/ INTRAOCULAR LENS  IMPLANT, BILATERAL Left    COLONOSCOPY WITH PROPOFOL N/A 04/08/2020   Procedure: COLONOSCOPY WITH PROPOFOL;  Surgeon: Toledo, Boykin Nearing, MD;  Location: [facility] ENDOS[facility];  Service: Gastroenterology;  Laterality: N/A;   CYSTOSCOPY W/ RETROGRADES Bilateral 07/02/2021   Procedure: CYSTOSCOPY WITH RETROGRADE PYELOGRAM;  Surgeon: Sondra Come, MD;  Location: [facility] OR[facility];  Service: Urology;  Laterality: Bilateral;   HERNIA REPAIR     IMPLANTABLE CARDIOVERTER DEFIBRILLATOR (ICD) GENERATOR CHANGE Left 07/03/2015   Procedure: ICD IMPLANT single chamber;  Surgeon: Sharion Settler, MD;  Location: [facility] ORS;  Service: Cardiovascular;  Laterality: Left;   TRANSURETHRAL RESECTION OF BLADDER TUMOR N/A 07/02/2021   Procedure: TRANSURETHRAL RESECTION OF BLADDER  TUMOR (TURBT);  Surgeon: Sondra Come, MD;  Location: [facility] ORS;  Service: Urology;  Laterality: N/A;    Social History:  reports that he quit smoking about 3 years ago. His smoking use included cigarettes. He started smoking about 33 years ago. He has a 7.5 pack-year smoking history. He has been exposed to tobacco smoke. He has never  used smokeless tobacco. He reports that he does not currently use drugs after having used the following drugs: Marijuana. Frequency: 2.00 times per week. He reports that he does not drink alcohol.  Family History:  Family History  Problem Relation Age of Onset   Heart failure Mother    Heart disease Mother    Alcoholism Father    Hypertension Sister    Diabetes Sister    Hypertension Brother    Diabetes Brother    Colon cancer Brother        vs prostate   Multiple myeloma Brother    Breast cancer Cousin      Prior to Admission medications   Medication Sig Start Date End Date Taking? Authorizing Provider  acetaminophen (TYLENOL) 325 MG tablet Take 2 tablets (650 mg total) by mouth every 4 (four) hours as needed for mild pain (or temp > 37.5 C (99.5 F)). 11/29/22   Elgergawy, Leana Roe, MD  acetaminophen (TYLENOL) 650 MG CR tablet Take 1,300 mg by mouth 2 (two) times daily. 12/06/22   [provider]  ASPIRIN LOW DOSE 81 MG tablet Take 81 mg by mouth daily. 10/05/21   [provider]  carvedilol (COREG) 3.125 MG tablet Take 3.125 mg by mouth 2 (two) times daily. 10/05/21   [provider]  cholecalciferol (VITAMIN D3) 25 MCG (1000 UNIT) tablet Take 1,000 Units by mouth daily.    [provider]  GLUMETZA 1000 MG 24 hr tablet Take 1,000 mg by mouth daily. 06/05/21   [provider]  LEUPROLIDE ACETATE, 6 MONTH, 45 MG injection Inject into the skin. 06/16/21   [provider]  LYNPARZA 100 MG tablet Take 100 mg by mouth 2 (two) times daily. 07/01/22   [provider]  LYNPARZA 150 MG tablet Take 150 mg by mouth 2 (two) times daily. 07/01/22   [provider]  magnesium oxide (MAG-OX) 400 (240 Mg) MG tablet Take 1 tablet by mouth daily. 06/05/21   [provider]  metFORMIN (GLUCOPHAGE-XR) 500 MG 24 hr tablet Take 500 mg by mouth daily with breakfast.    [provider]  Multiple Vitamin (ONE-DAILY MULTI-VITAMIN)  TABS Take 1 tablet by mouth daily. 10/05/21   [provider]  Omega-3 Fatty Acids (OMEGA 3 PO) Take 1 capsule by mouth 2 (two) times daily.    [provider]  omeprazole (PRILOSEC) 10 MG capsule Take 10 mg by mouth daily. 11/06/22   [provider]  senna (SENOKOT) 8.6 MG TABS tablet Take 2 tablets by mouth daily. 11/06/22   [provider]  spironolactone (ALDACTONE) 25 MG tablet Take 0.5 tablets (12.5 mg total) by mouth daily. 06/21/21   Alford Highland, MD  tamsulosin (FLOMAX) 0.4 MG CAPS capsule Take 1 capsule (0.4 mg total) by mouth daily. 02/04/21   Sondra Come, MD    Physical Exam: Vitals:   04/07/23 2135 04/07/23 2200 04/07/23 2230 04/08/23 0215  BP:  119/67 119/80 127/82  Pulse:  84 84 82  Resp:  (!) 23 (!) 26 18  Temp: 99.1 F (37.3 C)   98.8 F (37.1 C)  TempSrc: Oral   Oral  SpO2:  99% 100% 98%  Weight:      Height:       General: Not in acute distress HEENT:       Eyes: PERRL, EOMI, no jaundice       ENT: No discharge from the ears and nose, no pharynx injection       Neck: No JVD, no bruit, no mass felt. Heme: No neck lymph node enlargement. Cardiac: S1/S2, RRR, No murmurs, No gallops or rubs. Respiratory: No rales, wheezing, rhonchi or rubs. GI: Soft, nondistended, nontender,  organomegaly, BS present. GU: No hematuria Ext: 1+pitting leg edema bilaterally. 1+DP/PT pulse bilaterally. Musculoskeletal: No joint deformities, No joint redness or warmth, no limitation of ROM in spin. Skin: No rashes.  Neuro: Confused, partially following command, knows his own name, not oriented to place and time.  Cranial nerves II-XII grossly intact, moves all extremities. Psych: Patient is not psychotic, no suicidal or hemocidal ideation.  Labs on Admission: I have personally reviewed following labs and imaging studies  CBC: Recent Labs  Lab 04/07/23 1607  WBC 5.3  NEUTROABS 4.8  HGB 10.3*  HCT 32.8*  MCV 100.3*  PLT 180   Basic  Metabolic Panel: Recent Labs  Lab 04/07/23 1607  NA 139  K 3.8  CL 101  CO2 20*  GLUCOSE 345*  BUN 28*  CREATININE 1.16  CALCIUM 9.1  MG 1.6*  PHOS 3.7   GFR: Estimated Creatinine Clearance: 55.2 mL/min (by C-G formula based on SCr of 1.16 mg/dL). Liver Function Tests: Recent Labs  Lab 04/07/23 1607  AST 21  ALT 10  ALKPHOS 101  BILITOT 0.7  PROT 7.1  ALBUMIN 3.5   Recent Labs  Lab 04/07/23 1607  LIPASE 33   No results for input(s): "AMMONIA" in the last 168 hours. Coagulation Profile: Recent Labs  Lab 04/07/23 1607  INR 1.1   Cardiac Enzymes: Recent Labs  Lab 04/07/23 1607  CKTOTAL 42*   BNP (last 3 results) No results for input(s): "PROBNP" in the last 8760 hours. HbA1C: No results for input(s): "HGBA1C" in the last 72 hours. CBG: Recent Labs  Lab 04/07/23 2110  GLUCAP 306*   Lipid Profile: No results for input(s): "CHOL", "HDL", "LDLCALC", "TRIG", "CHOLHDL", "LDLDIRECT" in the last 72 hours. Thyroid Function Tests: Recent Labs    04/07/23 1607  TSH 1.565  FREET4 1.18*   Anemia Panel: No results for input(s): "VITAMINB12", "FOLATE", "FERRITIN", "TIBC", "IRON", "RETICCTPCT" in the last 72 hours. Urine analysis:    Component Value Date/Time   COLORURINE YELLOW (A) 01/16/2023 1917   APPEARANCEUR HAZY (A) 01/16/2023 1917   APPEARANCEUR Clear 02/23/2022 1000   LABSPEC 1.024 01/16/2023 1917   PHURINE 5.0 01/16/2023 1917   GLUCOSEU 50 (A) 01/16/2023 1917   HGBUR LARGE (A) 01/16/2023 1917   BILIRUBINUR NEGATIVE 01/16/2023 1917   BILIRUBINUR Negative 02/23/2022 1000   KETONESUR 5 (A) 01/16/2023 1917   PROTEINUR 100 (A) 01/16/2023 1917   NITRITE NEGATIVE 01/16/2023 1917   LEUKOCYTESUR NEGATIVE 01/16/2023 1917   Sepsis Labs: @LABRCNTIP (procalcitonin:4,lacticidven:4) ) Recent Results (from the past 240 hours)  Resp panel by RT-PCR (RSV, Flu A&B, Covid) Anterior Nasal Swab     Status: None   Collection Time: 04/07/23  4:16 PM   Specimen:  Anterior Nasal Swab  Result Value Ref Range Status   SARS Coronavirus 2 by RT PCR NEGATIVE NEGATIVE Final    Comment: (NOTE) SARS-CoV-2 target nucleic acids are NOT DETECTED.  The SARS-CoV-2  RNA is generally detectable in upper respiratory specimens during the acute phase of infection. The lowest concentration of SARS-CoV-2 viral copies this assay can detect is 138 copies/mL. A negative result does not preclude SARS-Cov-2 infection and should not be used as the sole basis for treatment or other patient management decisions. A negative result may occur with  improper specimen collection/handling, submission of specimen other than nasopharyngeal swab, presence of viral mutation(s) within the areas targeted by this assay, and inadequate number of viral copies(<138 copies/mL). A negative result must be combined with clinical observations, patient history, and epidemiological information. The expected result is Negative.  Fact Sheet for Patients:  BloggerCourse.com  Fact Sheet for Healthcare Providers:  SeriousBroker.it  This test is no t yet approved or cleared by the [place] FDA and  has been authorized for detection and/or diagnosis of SARS-CoV-2 by FDA under an Emergency Use Authorization (EUA). This EUA will remain  in effect (meaning this test can be used) for the duration of the COVID-19 declaration under Section 564(b)(1) of the Act, 21 U.S.C.section 360bbb-3(b)(1), unless the authorization is terminated  or revoked sooner.       Influenza A by PCR NEGATIVE NEGATIVE Final   Influenza B by PCR NEGATIVE NEGATIVE Final    Comment: (NOTE) The Xpert Xpress SARS-CoV-2/FLU/RSV plus assay is intended as an aid in the diagnosis of influenza from Nasopharyngeal swab specimens and should not be used as a sole basis for treatment. Nasal washings and aspirates are unacceptable for Xpert Xpress SARS-CoV-2/FLU/RSV testing.  Fact  Sheet for Patients: BloggerCourse.com  Fact Sheet for Healthcare Providers: SeriousBroker.it  This test is not yet approved or cleared by the [place] FDA and has been authorized for detection and/or diagnosis of SARS-CoV-2 by FDA under an Emergency Use Authorization (EUA). This EUA will remain in effect (meaning this test can be used) for the duration of the COVID-19 declaration under Section 564(b)(1) of the Act, 21 U.S.C. section 360bbb-3(b)(1), unless the authorization is terminated or revoked.     Resp Syncytial Virus by PCR NEGATIVE NEGATIVE Final    Comment: (NOTE) Fact Sheet for Patients: BloggerCourse.com  Fact Sheet for Healthcare Providers: SeriousBroker.it  This test is not yet approved or cleared by the [place] FDA and has been authorized for detection and/or diagnosis of SARS-CoV-2 by FDA under an Emergency Use Authorization (EUA). This EUA will remain in effect (meaning this test can be used) for the duration of the COVID-19 declaration under Section 564(b)(1) of the Act, 21 U.S.C. section 360bbb-3(b)(1), unless the authorization is terminated or revoked.  Performed at [facility], [address] [place], [place] [address]      Radiological Exams on Admission:   Assessment/Plan Principal Problem:   Acute pulmonary embolism (HCC) Active Problems:   DVT (deep venous thrombosis) (HCC)   Chronic systolic heart failure (HCC)   Essential (primary) hypertension   Stroke (HCC)   Hypomagnesemia   Elevated lactic acid level   Myocardial injury   Prostate cancer (HCC)   Acute metabolic encephalopathy   Assessment and Plan:    Acute pulmonary embolism (HCC) and DVT (deep venous thrombosis) (HCC): Patient has 2 L new oxygen requirement.  Currently hemodynamically stable.  ED physician consulted Dr. Wyn Quaker of [facility].  -admit to PCU as  pt -heparin drip initiated -2D echocardiogram ordered -prn albuterol nebs and mucinex  -Nasal cannula oxygen to maintain oxygen saturation above 93%  Chronic systolic heart failure (HCC): 2D echo on 11/27/2022 showed EF of  40-45%.  Patient has 1+ leg edema, and elevated BNP 299, indicating possible fluid overload. - Lasix 20 mg bid -Continue home spironolactone  Essential (primary) hypertension -IV hydralazine as needed -Coreg, spironolactone   History of Stroke (HCC) -Aspirin  Hypomagnesemia: Magnesium 1.6, potassium 3.8 -Repleted magnesium -Check phosphorus level --> 3.7  Elevated lactic acid level: Lactic acid of 4.6, no signs for infection.  No fever or leukocytosis, may be due to hypoxia. -Patient received 500 cc normal saline -Will not give more IV fluids due to fluid overload  Myocardial injury: Troponin 39- > 49, likely due to demand ischemia secondary PE -Continue aspirin  Prostate cancer (HCC) -Flomax -Patient getting leuprolide injection -Continue Lynparza  Acute metabolic encephalopathy: Likely multifactorial etiology.  CT head negative for acute intracranial abnormalities.  No focal neurodeficit on physical examination -Frequent neurocheck -For precaution    DVT ppx: on IV Heparin    Code Status: Full code    Family Communication:    Yes, patient's sister  at bed side.      Disposition Plan:  Anticipate discharge back to previous environment  Consults called:  Dr. Wyn Quaker of [facility] is consulted.  Admission status and Level of care: Progressive:  as inpt        Dispo: The patient is from: Home              Anticipated d/c is to: Home              Anticipated d/c date is: 2 days              Patient currently is not medically stable to d/c.    Severity of Illness:  The appropriate patient status for this patient is INPATIENT. Inpatient status is judged to be reasonable and necessary in order to provide the required intensity of service to ensure the  patient's safety. The patient's presenting symptoms, physical exam findings, and initial radiographic and laboratory data in the context of their chronic comorbidities is felt to place them at high risk for further clinical deterioration. Furthermore, it is not anticipated that the patient will be medically stable for discharge from the hospital within 2 midnights of admission.   * I certify that at the point of admission it is my clinical judgment that the patient will require inpatient hospital care spanning beyond 2 midnights from the point of admission due to high intensity of service, high risk for further deterioration and high frequency of surveillance required.*       Date of Service 04/08/2023    Lorretta Harp [facility]   If 7PM-7AM, please contact night-coverage [email or web address] 04/08/2023, 2:27 AM

## 2023-04-07 NOTE — Consult Note (Addendum)
PHARMACY - ANTICOAGULATION CONSULT NOTE  Pharmacy Consult for Heparin Indication: pulmonary embolus  No Known Allergies  Patient Measurements: Height: 5\' 5"  (165.1 cm) Weight: 85 kg (187 lb 6.3 oz) IBW/kg (Calculated) : 61.5 Heparin Dosing Weight: 79.3 kg  Vital Signs: Temp: 99.3 F (37.4 C) (01/31 1553) Temp Source: Axillary (01/31 1553) BP: 117/67 (01/31 1830) Pulse Rate: 94 (01/31 1830)  Labs: Recent Labs    04/07/23 1607  HGB 10.3*  HCT 32.8*  PLT 180  APTT 30  LABPROT 14.1  INR 1.1  CREATININE 1.16  CKTOTAL 42*  TROPONINIHS 39*    Estimated Creatinine Clearance: 55.2 mL/min (by C-G formula based on SCr of 1.16 mg/dL).   Medical History: Past Medical History:  Diagnosis Date   Cardiomyopathy (HCC)    Congestive heart failure (HCC)    Diabetes mellitus type 2, controlled (HCC)    Elevated lipids    Family history of breast cancer    Family history of colon cancer    HTN (hypertension)     Medications:  (Not in a hospital admission)  Scheduled:  Infusions:  PRN:  Anti-infectives (From admission, onward)    None       Assessment: 78 year old male presented to the ED with new onset SOB, rigors and altered mental. PMH of DM, CHF, stroke. Chest CT: Positive examination for multiple bilateral pulmonary emboli. RV to LV ratio is 0.9, borderline for right heart strain. No DOAC PTA. Hgb/plt stable at baseline.   Goal of Therapy:  Heparin level 0.3-0.7 units/ml Monitor platelets by anticoagulation protocol: Yes   Plan:  Give 4500 units bolus x 1 Start heparin infusion at 1250 units/hr Check anti-Xa level in 8 hours and daily while on heparin Continue to monitor H&H and platelets  Ronnald Ramp, PharmD, BCPS 04/07/2023,6:58 PM

## 2023-04-07 NOTE — ED Notes (Signed)
Rt called for [facility] sent--family in room, Dr. Fuller Plan in speaking with family.

## 2023-04-07 NOTE — ED Triage Notes (Signed)
Pt to ER from [facility] with c/o new onset SHOB, rigors and altered mental.  Pt had several runs of VTach enroute.

## 2023-04-07 NOTE — ED Provider Notes (Signed)
[facility] Provider Note    None    (approximate)   History   No chief complaint on file.   HPI  Matthew Mcgrath is a 78 y.o. 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  I reviewed the records where patient was admitted from 11/26/2022 where patient had acute encephalopathy with recurrent falls had an MRI that was negative.  Patient was planning to leave today from the facility when he had more shortness of breath initially noted to be in the 60s.  Patient was in a sinus tachycardic rate it but occasionally went into like what look like atrial flutter and occasionally went into some nonsustained V. tach about 6 beats.  Patient is on 4 L of oxygen.   [facility] doctor [phone]- Mohammed Kindle    Physical Exam   Triage Vital Signs: ED Triage Vitals  Encounter Vitals Group     BP      Systolic BP Percentile      Diastolic BP Percentile      Pulse      Resp      Temp      Temp src      SpO2      Weight      Height      Head Circumference      Peak Flow      Pain Score      Pain Loc      Pain Education      Exclude from Growth Chart     Most recent vital signs: Vitals:   04/07/23 1600 04/07/23 1615  BP:    Pulse: (!) 122 (!) 123  Resp: (!) 26 (!) 35  Temp:    SpO2: 99% 93%     General: Awake, no distress.  CV:  Good peripheral perfusion. Tachy  Resp:  Increased WOB Abd:  No distention. Soft and non tender Other:  Able to wiggle bilateral toes.  It able to squeeze bilateral hands.  Abdomen is soft and nontender. Trace edema bilateral  ED Results / Procedures / Treatments   Labs (all labs ordered are listed, but only abnormal results are displayed) Labs Reviewed  CBC WITH DIFFERENTIAL/PLATELET - Abnormal; Notable for the following components:      Result Value   RBC 3.27 (*)    Hemoglobin 10.3 (*)    HCT 32.8 (*)    MCV 100.3  (*)    nRBC 0.4 (*)    Lymphs Abs 0.3 (*)    All other components within normal limits  CULTURE, BLOOD (ROUTINE X 2)  CULTURE, BLOOD (ROUTINE X 2)  RESP PANEL BY RT-PCR (RSV, FLU A&B, COVID)  RVPGX2  LACTIC ACID, PLASMA  LACTIC ACID, PLASMA  COMPREHENSIVE METABOLIC PANEL  PROTIME-INR  APTT  BRAIN NATRIURETIC PEPTIDE  BLOOD GAS, VENOUS  LIPASE, BLOOD  URINALYSIS, W/ REFLEX TO CULTURE (INFECTION SUSPECTED)  MAGNESIUM  TSH  T4, FREE  CK  TROPONIN I (HIGH SENSITIVITY)     EKG  My interpretation of EKG:  Sinus tachycardia rate 124 without any ST elevation or T wave inversions, QTc 512  RADIOLOGY I have reviewed the xray personally and interpreted and patient has some patchiness noted bilaterally a little bit more defined than his prior x-ray  PROCEDURES:  Critical Care performed: Yes, see critical care procedure note(s)  .1-3 Lead EKG Interpretation  Performed by:  Concha Se, MD Authorized by: Concha Se, MD     Interpretation: abnormal     ECG rate:  120   ECG rate assessment: tachycardic     Rhythm: sinus tachycardia     Ectopy: none     Conduction: normal   .Critical Care  Performed by: Concha Se, MD Authorized by: Concha Se, MD   Critical care provider statement:    Critical care time (minutes):  30   Critical care was necessary to treat or prevent imminent or life-threatening deterioration of the following conditions:  Respiratory failure   Critical care was time spent personally by me on the following activities:  Development of treatment plan with patient or surrogate, discussions with consultants, evaluation of patient's response to treatment, examination of patient, ordering and review of laboratory studies, ordering and review of radiographic studies, ordering and performing treatments and interventions, pulse oximetry, re-evaluation of patient's condition and review of old charts    MEDICATIONS ORDERED IN ED: Medications  heparin bolus  via infusion 4,500 Units (has no administration in time range)  heparin ADULT infusion 100 units/mL (25000 units/275mL) (1,250 Units/hr Intravenous New Bag/Given 04/07/23 1916)  albuterol (PROVENTIL) (2.5 MG/3ML) 0.083% nebulizer solution 2.5 mg (has no administration in time range)  dextromethorphan-guaiFENesin (MUCINEX DM) 30-600 MG per 12 hr tablet 1 tablet (has no administration in time range)  hydrALAZINE (APRESOLINE) injection 5 mg (has no administration in time range)  acetaminophen (TYLENOL) tablet 650 mg (has no administration in time range)  diphenhydrAMINE (BENADRYL) injection 12.5 mg (has no administration in time range)  insulin aspart (novoLOG) injection 0-5 Units (has no administration in time range)  insulin aspart (novoLOG) injection 0-9 Units (has no administration in time range)  sodium chloride 0.9 % bolus 500 mL (0 mLs Intravenous Stopped 04/07/23 1822)  iohexol (OMNIPAQUE) 350 MG/ML injection 75 mL (75 mLs Intravenous Contrast Given 04/07/23 1729)  magnesium sulfate IVPB 2 g 50 mL (0 g Intravenous Stopped 04/07/23 1847)     IMPRESSION / MDM / ASSESSMENT AND PLAN / ED COURSE  I reviewed the triage vital signs and the nursing notes.   Patient's presentation is most consistent with acute presentation with potential threat to life or bodily function.   Patient comes in with concerns for tachycardia, shortness of breath.  Patient is currently on 2 L of oxygen workup done to evaluate for pneumonia, COVID, flu, CHF, ACS.  There was concern of some episodes of nonsustained V. tach but patient does have ICD noted.  Will get interrogation done.  Does not sound like any shocks were delivered per EMS.  Family is now at bedside who reports that he is at his normal baseline self.  He denies any chest pain shortness of breath or abdominal pain.  He states he is not really remember what happened earlier.  COVID, flu are negative.  Lactate was elevated at 4.6.  Giving some gentle hydration  given his CHF.  His glucose was elevated at 345 with a slight anion gap of 18.  Patient getting some fluids to start.  His CBC shows hemoglobin that is stable from baseline.  BNP slightly elevated VBG does not show any evidence of hypercapnia or acidemia.  Troponin slightly elevated magnesium low we will give some repletion.  I reviewed the CT imaging and patient has pulmonary embolisms noted.  Patient waiting to be started on heparin and admitted to the hospitalist team.  The patient is on the cardiac monitor to evaluate  for evidence of arrhythmia and/or significant heart rate changes.      FINAL CLINICAL IMPRESSION(S) / ED DIAGNOSES   Final diagnoses:  Other acute pulmonary embolism without acute cor pulmonale (HCC)     Rx / DC Orders   ED Discharge Orders     None        Note:  This document was prepared using Dragon voice recognition software and may include unintentional dictation errors.   Concha Se, MD 04/07/23 [phone]

## 2023-04-07 NOTE — ED Notes (Signed)
When pt arrived, EMS had pt on a non-rebreather.  Placed on oxygen via [place] and oxygen stayed at 95% at 2L and above. Pt was changed into a gown--his oxygen was taken off for a trial or RA--oxygen decreased to 90% on RA.  Placed back on 2L via [place] and improved.

## 2023-04-08 DIAGNOSIS — I82409 Acute embolism and thrombosis of unspecified deep veins of unspecified lower extremity: Secondary | ICD-10-CM | POA: Diagnosis present

## 2023-04-08 DIAGNOSIS — G9341 Metabolic encephalopathy: Secondary | ICD-10-CM | POA: Diagnosis present

## 2023-04-08 DIAGNOSIS — I2699 Other pulmonary embolism without acute cor pulmonale: Secondary | ICD-10-CM | POA: Diagnosis not present

## 2023-04-08 LAB — CBG MONITORING, ED
Glucose-Capillary: 345 mg/dL — ABNORMAL HIGH (ref 70–99)
Glucose-Capillary: 277 mg/dL — ABNORMAL HIGH (ref 70–99)
Glucose-Capillary: 187 mg/dL — ABNORMAL HIGH (ref 70–99)
Glucose-Capillary: 134 mg/dL — ABNORMAL HIGH (ref 70–99)
Glucose-Capillary: 291 mg/dL — ABNORMAL HIGH (ref 70–99)
Glucose-Capillary: 325 mg/dL — ABNORMAL HIGH (ref 70–99)

## 2023-04-08 LAB — CBC
HCT: 29.7 % — ABNORMAL LOW (ref 39.0–52.0)
Hemoglobin: 9.7 g/dL — ABNORMAL LOW (ref 13.0–17.0)
MCH: 32.4 pg (ref 26.0–34.0)
MCHC: 32.7 g/dL (ref 30.0–36.0)
MCV: 99.3 fL (ref 80.0–100.0)
Platelets: 198 10*3/uL (ref 150–400)
RBC: 2.99 MIL/uL — ABNORMAL LOW (ref 4.22–5.81)
RDW: 15.7 % — ABNORMAL HIGH (ref 11.5–15.5)
WBC: 4 10*3/uL (ref 4.0–10.5)
nRBC: 0.7 % — ABNORMAL HIGH (ref 0.0–0.2)

## 2023-04-08 LAB — BASIC METABOLIC PANEL
Anion gap: 12 (ref 5–15)
BUN: 26 mg/dL — ABNORMAL HIGH (ref 8–23)
CO2: 25 mmol/L (ref 22–32)
Calcium: 8.7 mg/dL — ABNORMAL LOW (ref 8.9–10.3)
Chloride: 103 mmol/L (ref 98–111)
Creatinine, Ser: 1.09 mg/dL (ref 0.61–1.24)
GFR, Estimated: >60 mL/min (ref >60)
Glucose, Bld: 326 mg/dL — ABNORMAL HIGH (ref 70–99)
Potassium: 4.2 mmol/L (ref 3.5–5.1)
Sodium: 140 mmol/L (ref 135–145)

## 2023-04-08 LAB — HEMOGLOBIN A1C
Hgb A1c MFr Bld: 8.8 % — ABNORMAL HIGH (ref 4.8–5.6)
Mean Plasma Glucose: 205.86 mg/dL

## 2023-04-08 LAB — HEPARIN LEVEL (UNFRACTIONATED)
Heparin Unfractionated: 0.55 [IU]/mL (ref 0.30–0.70)
Heparin Unfractionated: 0.69 [IU]/mL (ref 0.30–0.70)

## 2023-04-08 LAB — MAGNESIUM: Magnesium: 2.1 mg/dL (ref 1.7–2.4)

## 2023-04-08 MED ORDER — OLAPARIB 150 MG PO TABS
150.0000 mg | ORAL_TABLET | Freq: Two times a day (BID) | ORAL | Status: DC
  Administered 2023-04-08 – 2023-04-11 (×4): 150 mg via ORAL
  Filled 2023-04-08 (×4): qty 1

## 2023-04-08 MED ORDER — ADULT MULTIVITAMIN W/MINERALS CH
1.0000 | ORAL_TABLET | Freq: Every day | ORAL | Status: DC
  Administered 2023-04-08 – 2023-04-11 (×4): 1 via ORAL
  Filled 2023-04-08 (×4): qty 1

## 2023-04-08 MED ORDER — FUROSEMIDE 20 MG PO TABS
20.0000 mg | ORAL_TABLET | Freq: Every day | ORAL | Status: DC
  Administered 2023-04-09 – 2023-04-11 (×3): 20 mg via ORAL
  Filled 2023-04-08 (×3): qty 1

## 2023-04-08 MED ORDER — CARVEDILOL 3.125 MG PO TABS
3.1250 mg | ORAL_TABLET | Freq: Two times a day (BID) | ORAL | Status: DC
  Administered 2023-04-08 – 2023-04-11 (×7): 3.125 mg via ORAL
  Filled 2023-04-08 (×8): qty 1

## 2023-04-08 MED ORDER — TAMSULOSIN HCL 0.4 MG PO CAPS
0.4000 mg | ORAL_CAPSULE | Freq: Every day | ORAL | Status: DC
  Administered 2023-04-08 – 2023-04-11 (×4): 0.4 mg via ORAL
  Filled 2023-04-08 (×4): qty 1

## 2023-04-08 MED ORDER — VITAMIN D 25 MCG (1000 UNIT) PO TABS
1000.0000 [IU] | ORAL_TABLET | Freq: Every day | ORAL | Status: DC
  Administered 2023-04-08 – 2023-04-11 (×4): 1000 [IU] via ORAL
  Filled 2023-04-08 (×4): qty 1

## 2023-04-08 MED ORDER — SPIRONOLACTONE 12.5 MG HALF TABLET
12.5000 mg | ORAL_TABLET | Freq: Every day | ORAL | Status: DC
  Administered 2023-04-08 – 2023-04-11 (×4): 12.5 mg via ORAL
  Filled 2023-04-08 (×4): qty 1

## 2023-04-08 MED ORDER — PANTOPRAZOLE SODIUM 40 MG PO TBEC
40.0000 mg | DELAYED_RELEASE_TABLET | Freq: Every day | ORAL | Status: DC
  Administered 2023-04-08 – 2023-04-11 (×4): 40 mg via ORAL
  Filled 2023-04-08 (×4): qty 1

## 2023-04-08 MED ORDER — OLAPARIB 100 MG PO TABS
100.0000 mg | ORAL_TABLET | Freq: Two times a day (BID) | ORAL | Status: DC
  Administered 2023-04-08 – 2023-04-11 (×4): 100 mg via ORAL
  Filled 2023-04-08 (×4): qty 1

## 2023-04-08 MED ORDER — FUROSEMIDE 10 MG/ML IJ SOLN
20.0000 mg | Freq: Two times a day (BID) | INTRAMUSCULAR | Status: DC
  Administered 2023-04-08: 20 mg via INTRAVENOUS
  Filled 2023-04-08: qty 4

## 2023-04-08 MED ORDER — ASPIRIN 81 MG PO TBEC
81.0000 mg | DELAYED_RELEASE_TABLET | Freq: Every day | ORAL | Status: DC
  Administered 2023-04-08: 81 mg via ORAL
  Filled 2023-04-08 (×2): qty 1

## 2023-04-08 MED ORDER — SENNA 8.6 MG PO TABS
2.0000 | ORAL_TABLET | Freq: Every day | ORAL | Status: DC
  Administered 2023-04-08 – 2023-04-11 (×4): 17.2 mg via ORAL
  Filled 2023-04-08 (×4): qty 2

## 2023-04-08 MED ORDER — OLAPARIB 150 MG PO TABS: 150.0000 mg | ORAL_TABLET | Freq: Two times a day (BID) | ORAL | Status: DC

## 2023-04-08 NOTE — ED Notes (Signed)
 Family at bedside.

## 2023-04-08 NOTE — Consult Note (Signed)
PHARMACY - ANTICOAGULATION CONSULT NOTE  Pharmacy Consult for Heparin Indication: pulmonary embolus  No Known Allergies  Patient Measurements: Height: 5\' 5"  (165.1 cm) Weight: 85 kg (187 lb 6.3 oz) IBW/kg (Calculated) : 61.5 Heparin Dosing Weight: 79.3 kg  Vital Signs: Temp: 97.6 F (36.4 C) (02/01 1119) Temp Source: Oral (02/01 1119) BP: 125/73 (02/01 1400) Pulse Rate: 86 (02/01 1400)  Labs: Recent Labs    04/07/23 1607 04/07/23 1825 04/08/23 0414 04/08/23 1423  HGB 10.3*  --  9.7*  --   HCT 32.8*  --  29.7*  --   PLT 180  --  198  --   APTT 30  --   --   --   LABPROT 14.1  --   --   --   INR 1.1  --   --   --   HEPARINUNFRC  --   --  0.55 0.69  CREATININE 1.16  --  1.09  --   CKTOTAL 42*  --   --   --   TROPONINIHS 39* 49*  --   --    Estimated Creatinine Clearance: 58.7 mL/min (by C-G formula based on SCr of 1.09 mg/dL).  Medical History: No DOAC PTA.  Assessment: 78 year old male presented to the ED with new onset SOB, rigors and altered mental. PMH of DM, CHF, stroke. Chest CT: Positive examination for multiple bilateral pulmonary emboli. RV to LV ratio is 0.9, borderline for right heart strain. Pharmacy was consulted to dose and manage this patient's heparin.   Baseline Labs: PT/INR 14.1/1.1 Hgb 10.3 Plt 180  Goal of Therapy:  Heparin level 0.3-0.7 units/ml Monitor platelets by anticoagulation protocol: Yes  Heparin Levels Date/Time HL Clinical Assessment  2/1@0414  HL = 0.55 Therapeutic x 1  2/1@1423  HL = 0.69 Therapeutic x 2               Plan:  Heparin is Therapeutic x 2 Continue heparin gtt at 1,250 units/hr Re-check HL with AM labs  Monitor CBC daily while on heparin    Effie Shy, PharmD Pharmacy Resident  04/08/2023 2:54 PM

## 2023-04-08 NOTE — ED Notes (Addendum)
Patient's family brought in food from cookout for the patient.

## 2023-04-08 NOTE — ED Notes (Signed)
Dr. Myriam Forehand with patient and family.

## 2023-04-08 NOTE — ED Notes (Addendum)
This RN reached out to patients sister, Avik Leoni; made aware of need for her to bring in patients Lynparza medication as this is not something that is supplied by our pharmacy.  Expressed verbal understanding w/out any questions at this time; states she should be here around 1000 today.

## 2023-04-08 NOTE — ED Notes (Signed)
Lynparza 100mg  bottle and 150mg  bottle are down in pharmacy and need to go home with patient.

## 2023-04-08 NOTE — ED Notes (Signed)
Full bed change, brief change, and peri care performed at this time. New gown and warm blankets applied. Pt resting comfortably.

## 2023-04-08 NOTE — ED Notes (Signed)
Pt incontinent of urine. Pt's brief and linens changed and warm blanket applied.

## 2023-04-08 NOTE — ED Notes (Signed)
Patient placed on bedpan per request and given the urinal.

## 2023-04-08 NOTE — ED Notes (Signed)
Dr. Clearnce Sorrel is at bedside and states patient is at his baseline in mental status at this time.

## 2023-04-08 NOTE — ED Notes (Signed)
Patient wasn't able to have a bowel movement. Patient was taken off the bedpan, brief was changed that had urine in it, though patient does use the urinal. A chux was placed under the patient and patient was repositioned on stretcher. Patient tolerated movement well.

## 2023-04-08 NOTE — Consult Note (Signed)
[facility]  Patient name: Matthew Mcgrath MRN: [id number] DOB: [birth year] Sex: male   REQUESTING PROVIDER:    ER   REASON FOR CONSULT:    PE  HISTORY OF PRESENT ILLNESS:   Matthew Mcgrath is a 78 y.o. male, who presented to the emergency department with shortness of breath and confusion which has been going on for a day.  The patient suffers from prostate cancer with known metastases to the bone.  He is a former smoker.  He underwent a CT scan that showed bilateral pulmonary emboli with borderline right heart strain.  He was also found to have an occlusive DVT in the mid right femoral vein.  He has been started on anticoagulation.  This morning he does not complain of any chest pain or shortness of breath.  The patient suffers from congestive heart failure.  He is a type II diabetic.  He is medically managed for hypertension.  PAST MEDICAL HISTORY    Past Medical History:  Diagnosis Date   Cardiomyopathy (HCC)    Congestive heart failure (HCC)    Diabetes mellitus type 2, controlled (HCC)    Elevated lipids    Family history of breast cancer    Family history of colon cancer    HTN (hypertension)      FAMILY HISTORY   Family History  Problem Relation Age of Onset   Heart failure Mother    Heart disease Mother    Alcoholism Father    Hypertension Sister    Diabetes Sister    Hypertension Brother    Diabetes Brother    Colon cancer Brother        vs prostate   Multiple myeloma Brother    Breast cancer Cousin     SOCIAL HISTORY:   Social History   Socioeconomic History   Marital status: Single    Spouse name: Not on file   Number of children: Not on file   Years of education: Not on file   Highest education level: Not on file  Occupational History   Not on file  Tobacco Use   Smoking status: Former    Current packs/day: 0.00    Average packs/day: 0.3 packs/day for 30.0 years (7.5 ttl  pk-yrs)    Types: Cigarettes    Start date: 02/10/1990    Quit date: 02/11/2020    Years since quitting: 3.1    Passive exposure: Past   Smokeless tobacco: Never  Vaping Use   Vaping status: Never Used  Substance and Sexual Activity   Alcohol use: No   Drug use: Not Currently    Frequency: 2.0 times per week    Types: Marijuana    Comment: past use   Sexual activity: Not on file  Other Topics Concern   Not on file  Social History Narrative   Not on file   Social Drivers of Health   Financial Resource Strain: Not on file  Food Insecurity: No Food Insecurity (11/26/2022)   Hunger Vital Sign    Worried About Running Out of Food in the Last Year: Never true    Ran Out of Food in the Last Year: Never true  Transportation Needs: No Transportation Needs (11/26/2022)   PRAPARE - Administrator, Civil Service (Medical): No    Lack of Transportation (Non-Medical): No  Physical Activity: Not on file  Stress: Not on file  Social Connections: Not on file  Intimate Partner Violence: Not At  Risk (11/26/2022)   Humiliation, Afraid, Rape, and Kick questionnaire    Fear of Current or Ex-Partner: No    Emotionally Abused: No    Physically Abused: No    Sexually Abused: No    ALLERGIES:    No Known Allergies  CURRENT MEDICATIONS:    Current Facility-Administered Medications  Medication Dose Route Frequency Provider Last Rate Last Admin   acetaminophen (TYLENOL) tablet 650 mg  650 mg Oral Q6H PRN Lorretta Harp, MD       albuterol (PROVENTIL) (2.5 MG/3ML) 0.083% nebulizer solution 2.5 mg  2.5 mg Nebulization Q4H PRN Lorretta Harp, MD       aspirin EC tablet 81 mg  81 mg Oral Daily Lorretta Harp, MD   81 mg at 04/08/23 1125   carvedilol (COREG) tablet 3.125 mg  3.125 mg Oral BID WC Lorretta Harp, MD   3.125 mg at 04/08/23 0330   cholecalciferol (VITAMIN D3) 25 MCG (1000 UNIT) tablet 1,000 Units  1,000 Units Oral Daily Lorretta Harp, MD   1,000 Units at 04/08/23 1127    dextromethorphan-guaiFENesin (MUCINEX DM) 30-600 MG per 12 hr tablet 1 tablet  1 tablet Oral BID PRN Lorretta Harp, MD       diphenhydrAMINE (BENADRYL) injection 12.5 mg  12.5 mg Intravenous Q8H PRN Lorretta Harp, MD       Melene Muller ON 04/09/2023] furosemide (LASIX) tablet 20 mg  20 mg Oral Daily Lurene Shadow, MD       heparin ADULT infusion 100 units/mL (25000 units/270mL)  1,250 Units/hr Intravenous Continuous Ronnald Ramp, RPH 12.5 mL/hr at 04/08/23 1227 1,250 Units/hr at 04/08/23 1227   hydrALAZINE (APRESOLINE) injection 5 mg  5 mg Intravenous Q2H PRN Lorretta Harp, MD       insulin aspart (novoLOG) injection 0-5 Units  0-5 Units Subcutaneous QHS Lorretta Harp, MD   4 Units at 04/07/23 2113   insulin aspart (novoLOG) injection 0-9 Units  0-9 Units Subcutaneous TID WC Lorretta Harp, MD   7 Units at 04/08/23 4098   multivitamin with minerals tablet 1 tablet  1 tablet Oral Daily Lorretta Harp, MD   1 tablet at 04/08/23 1125   olaparib (LYNPARZA) tablet 150 mg  150 mg Oral BID Lorretta Harp, MD       pantoprazole (PROTONIX) EC tablet 40 mg  40 mg Oral Daily Lorretta Harp, MD   40 mg at 04/08/23 1127   senna (SENOKOT) tablet 17.2 mg  2 tablet Oral Daily Lorretta Harp, MD   17.2 mg at 04/08/23 1122   spironolactone (ALDACTONE) tablet 12.5 mg  12.5 mg Oral Daily Lorretta Harp, MD   12.5 mg at 04/08/23 1128   tamsulosin (FLOMAX) capsule 0.4 mg  0.4 mg Oral Daily Lorretta Harp, MD   0.4 mg at 04/08/23 1126   Current Outpatient Medications  Medication Sig Dispense Refill   acetaminophen (TYLENOL) 650 MG CR tablet Take 1,300 mg by mouth 2 (two) times daily.     ASPIRIN LOW DOSE 81 MG tablet Take 81 mg by mouth daily.     carvedilol (COREG) 3.125 MG tablet Take 3.125 mg by mouth 2 (two) times daily.     cholecalciferol (VITAMIN D3) 25 MCG (1000 UNIT) tablet Take 1,000 Units by mouth daily.     GLUMETZA 1000 MG 24 hr tablet Take 1,000 mg by mouth daily.     LEUPROLIDE ACETATE, 6 MONTH, 45 MG injection Inject into the skin.     LYNPARZA  100 MG tablet Take 100 mg by  mouth 2 (two) times daily.     LYNPARZA 150 MG tablet Take 150 mg by mouth 2 (two) times daily.     magnesium oxide (MAG-OX) 400 (240 Mg) MG tablet Take 1 tablet by mouth daily.     metFORMIN (GLUCOPHAGE-XR) 500 MG 24 hr tablet Take 500 mg by mouth daily with breakfast.     Multiple Vitamin (ONE-DAILY MULTI-VITAMIN) TABS Take 1 tablet by mouth daily.     omeprazole (PRILOSEC) 10 MG capsule Take 10 mg by mouth daily.     senna (SENOKOT) 8.6 MG TABS tablet Take 2 tablets by mouth daily.     spironolactone (ALDACTONE) 25 MG tablet Take 0.5 tablets (12.5 mg total) by mouth daily.     tamsulosin (FLOMAX) 0.4 MG CAPS capsule Take 1 capsule (0.4 mg total) by mouth daily. 30 capsule 2   acetaminophen (TYLENOL) 325 MG tablet Take 2 tablets (650 mg total) by mouth every 4 (four) hours as needed for mild pain (or temp > 37.5 C (99.5 F)).      REVIEW OF SYSTEMS:   [X]  denotes positive finding, [ ]  denotes negative finding Cardiac  Comments:  Chest pain or chest pressure:    Shortness of breath upon exertion: x   Short of breath when lying flat: x   Irregular heart rhythm:        Vascular    Pain in calf, thigh, or hip brought on by ambulation:    Pain in feet at night that wakes you up from your sleep:     Blood clot in your veins:    Leg swelling:         Pulmonary    Oxygen at home:    Productive cough:     Wheezing:         Neurologic    Sudden weakness in arms or legs:     Sudden numbness in arms or legs:     Sudden onset of difficulty speaking or slurred speech:    Temporary loss of vision in one eye:     Problems with dizziness:         Gastrointestinal    Blood in stool:      Vomited blood:         Genitourinary    Burning when urinating:     Blood in urine:        Psychiatric    Major depression:         Hematologic    Bleeding problems:    Problems with blood clotting too easily:        Skin    Rashes or ulcers:        Constitutional     Fever or chills:     PHYSICAL EXAM:   Vitals:   04/08/23 1130 04/08/23 1200 04/08/23 1230 04/08/23 1300  BP: 126/80 121/69 124/78 119/77  Pulse: 80 78 83 84  Resp:      Temp:      TempSrc:      SpO2: 98% 99% 98% 97%  Weight:      Height:        GENERAL: The patient is a well-nourished male, in no acute distress. The vital signs are documented above. CARDIAC: There is a regular rate and rhythm.  VASCULAR: No calf tenderness bilaterally.  The right leg is slightly more tense and edematous. PULMONARY: Nonlabored respirations MUSCULOSKELETAL: There are no major deformities or cyanosis. NEUROLOGIC: No focal weakness or paresthesias are detected. SKIN: There  are no ulcers or rashes noted. PSYCHIATRIC: The patient has a normal affect.  STUDIES:   I have reviewed the following studies: CT angiogram: 1. Positive examination for multiple bilateral pulmonary emboli. RV to LV ratio is 0.9, borderline for right heart strain. Clinical correlation recommended. 2. Known skeletal metastases are demonstrated. 3. Emphysematous changes in the lungs. 4. Aortic atherosclerosis  Lower extremity venous Doppler: 1. Occlusive DVT within the mid segment of the right femoral vein and nonocclusive DVT within the distal segment. 2. No evidence of left lower extremity DVT.   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  Therefore anticoagulation is recommended.  This will likely be lifelong.  The DVT is in the mid femoral vein.  He does not have significant leg pain and only mild edema.  No intervention on the DVT is recommended because of his symptoms and because of the location of the DVT.  In addition, I do not recommend pulmonary embolectomy.  The patient is on 2 L of oxygen.  He does not have chest pain or shortness of breath.  He is not tachycardic.  This was discussed with the family at the bedside.  All questions were answered.   Charlena Cross, MD,  FACS [facility] [phone] Pager [phone]

## 2023-04-08 NOTE — ED Notes (Signed)
Pt's sister left to get some rest but left her contact information; Ildefonso Keaney [phone]

## 2023-04-08 NOTE — ED Notes (Signed)
Dr. Berneice Gandy from [facility] called and gave number for consult [phone].

## 2023-04-08 NOTE — Consult Note (Signed)
PHARMACY - ANTICOAGULATION CONSULT NOTE  Pharmacy Consult for Heparin Indication: pulmonary embolus  No Known Allergies  Patient Measurements: Height: 5\' 5"  (165.1 cm) Weight: 85 kg (187 lb 6.3 oz) IBW/kg (Calculated) : 61.5 Heparin Dosing Weight: 79.3 kg  Vital Signs: Temp: 98.8 F (37.1 C) (02/01 0215) Temp Source: Oral (02/01 0215) BP: 119/72 (02/01 0330) Pulse Rate: 85 (02/01 0330)  Labs: Recent Labs    04/07/23 1607 04/07/23 1825 04/08/23 0414  HGB 10.3*  --  9.7*  HCT 32.8*  --  29.7*  PLT 180  --  198  APTT 30  --   --   LABPROT 14.1  --   --   INR 1.1  --   --   HEPARINUNFRC  --   --  0.55  CREATININE 1.16  --  1.09  CKTOTAL 42*  --   --   TROPONINIHS 39* 49*  --     Estimated Creatinine Clearance: 58.7 mL/min (by C-G formula based on SCr of 1.09 mg/dL).   Medical History: Past Medical History:  Diagnosis Date   Cardiomyopathy (HCC)    Congestive heart failure (HCC)    Diabetes mellitus type 2, controlled (HCC)    Elevated lipids    Family history of breast cancer    Family history of colon cancer    HTN (hypertension)     Medications:  (Not in a hospital admission)  Scheduled:  Infusions:  PRN:  Anti-infectives (From admission, onward)    None       Assessment: 78 year old male presented to the ED with new onset SOB, rigors and altered mental. PMH of DM, CHF, stroke. Chest CT: Positive examination for multiple bilateral pulmonary emboli. RV to LV ratio is 0.9, borderline for right heart strain. No DOAC PTA. Hgb/plt stable at baseline.   Goal of Therapy:  Heparin level 0.3-0.7 units/ml Monitor platelets by anticoagulation protocol: Yes   Plan:  2/1:  HL @ 0414 = 0.55, therapeutic X 1 - Will continue pt on current rate and recheck HL in 8 hrs @ 1200.   Meygan Kyser D, PharmD 04/08/2023,5:15 AM

## 2023-04-08 NOTE — ED Notes (Signed)
Patient ate a few bites of spaghetti, all of the broccoli and all of the peaches and drank the ice tea at dinner.

## 2023-04-08 NOTE — Progress Notes (Addendum)
Progress Note    Matthew Mcgrath  WUJ:[id number] DOB: [birth year]  DOA: 04/07/2023 PCP: [facility]      Brief Narrative:    Medical records reviewed and are as summarized below:  Matthew Mcgrath is a 78 y.o. 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       Assessment/Plan:   Principal Problem:   Acute pulmonary embolism (HCC) Active Problems:   DVT (deep venous thrombosis) (HCC)   Chronic systolic heart failure (HCC)   Essential (primary) hypertension   Stroke (HCC)   Hypomagnesemia   Elevated lactic acid level   Myocardial injury   Prostate cancer (HCC)   Acute metabolic encephalopathy    Body mass index is 31.18 kg/m.    Acute pulmonary embolism (HCC) and right femoral DVT (deep venous thrombosis) (HCC):  Continue IV heparin drip and monitor heparin level per protocol. 2D echo is pending Follow-up with vascular surgeon.   Acute hypoxemic respiratory failure Continue 2 L/min oxygen via [place].  Wean off oxygen as able.    Chronic systolic heart failure (HCC):  2D echo on 11/27/2022 showed EF of 40-45%.   Leg edema is better.  Switch IV to oral Lasix. Continue home spironolactone.   Hypomagnesemia Magnesium was repleted.  Repeat magnesium level and replete as needed.     Elevated lactic acid level: Lactic acid of 4.6, no signs for infection.  No fever or leukocytosis, may be due to hypoxia. -Patient received 500 cc normal saline in the ED    Myocardial injury: Troponin 39- > 49, likely due to demand ischemia secondary PE -Continue aspirin   Type 2 diabetes mellitus with hyperglycemia He is NovoLog as needed for hyperglycemia    Prostate cancer [facility])  admitted with sepsis due to bones (ribs, thoracic, lumbar spine and pelvis) Continue Flomax, Lynparza He was on leuprolide prior to admission.    Acute metabolic encephalopathy: Likely multifactorial etiology.  CT head negative for acute intracranial abnormalities.       Comorbidities include history of stroke on aspirin, hypertension              Diet Order             Diet NPO time specified Except for: Sips with Meds, Ice Chips  Diet effective midnight                            Consultants: Vascular surgeon  Procedures: None    Medications:    aspirin EC  81 mg Oral Daily   carvedilol  3.125 mg Oral BID WC   cholecalciferol  1,000 Units Oral Daily   furosemide  20 mg Intravenous Q12H   insulin aspart  0-5 Units Subcutaneous QHS   insulin aspart  0-9 Units Subcutaneous TID WC   multivitamin with minerals  1 tablet Oral Daily   olaparib  150 mg Oral BID   pantoprazole  40 mg Oral Daily   senna  2 tablet Oral Daily   spironolactone  12.5 mg Oral Daily   tamsulosin  0.4 mg Oral Daily   Continuous Infusions:  heparin 1,250 Units/hr (04/07/23 1916)     Anti-infectives (From admission, onward)    None  Family Communication/Anticipated D/C date and plan/Code Status   DVT prophylaxis:      Code Status: Full Code  Family Communication: Plan discussed with this twin brother, Matthew Mcgrath (daughter) and Matthew Mcgrath (niece) Disposition Plan: Plan to discharge home   Status is: Inpatient Remains inpatient appropriate because: Acute pulmonary embolism       Subjective:   Interval events noted.  He has no complaints.  He is confused and cannot provide an adequate history.  He denies any chest pain or shortness of breath.  Matthew Sims, RN, was at the bedside  Objective:    Vitals:   04/08/23 1030 04/08/23 1100 04/08/23 1119 04/08/23 1130  BP: 116/70 121/76  126/80  Pulse: 80 80  80  Resp:      Temp:   97.6 F (36.4 C)    TempSrc:   Oral   SpO2: 99% 96%  98%  Weight:      Height:       No data found.  No intake or output data in the 24 hours ending 04/08/23 1213 Filed Weights   04/07/23 1555  Weight: 85 kg    Exam:  GEN: NAD SKIN: Warm and dry EYES: No pallor or icterus ENT: MMM CV: RRR PULM: CTA B ABD: soft, ND, NT, +BS CNS: AAO x 1 (person), non focal EXT: No edema or tenderness        Data Reviewed:   I have personally reviewed following labs and imaging studies:  Labs: Labs show the following:   Basic Metabolic Panel: Recent Labs  Lab 04/07/23 1607 04/08/23 0414  NA 139 140  K 3.8 4.2  CL 101 103  CO2 20* 25  GLUCOSE 345* 326*  BUN 28* 26*  CREATININE 1.16 1.09  CALCIUM 9.1 8.7*  MG 1.6*  --   PHOS 3.7  --    GFR Estimated Creatinine Clearance: 58.7 mL/min (by C-G formula based on SCr of 1.09 mg/dL). Liver Function Tests: Recent Labs  Lab 04/07/23 1607  AST 21  ALT 10  ALKPHOS 101  BILITOT 0.7  PROT 7.1  ALBUMIN 3.5   Recent Labs  Lab 04/07/23 1607  LIPASE 33   No results for input(s): "AMMONIA" in the last 168 hours. Coagulation profile Recent Labs  Lab 04/07/23 1607  INR 1.1    CBC: Recent Labs  Lab 04/07/23 1607 04/08/23 0414  WBC 5.3 4.0  NEUTROABS 4.8  --   HGB 10.3* 9.7*  HCT 32.8* 29.7*  MCV 100.3* 99.3  PLT 180 198   Cardiac Enzymes: Recent Labs  Lab 04/07/23 1607  CKTOTAL 42*   BNP (last 3 results) No results for input(s): "PROBNP" in the last 8760 hours. CBG: Recent Labs  Lab 04/07/23 2110 04/08/23 0736 04/08/23 0948  GLUCAP 306* 345* 277*   D-Dimer: No results for input(s): "DDIMER" in the last 72 hours. Hgb A1c: No results for input(s): "HGBA1C" in the last 72 hours. Lipid Profile: No results for input(s): "CHOL", "HDL", "LDLCALC", "TRIG", "CHOLHDL", "LDLDIRECT" in the last 72 hours. Thyroid function studies: Recent Labs    04/07/23 1607  TSH 1.565   Anemia work up: No results for input(s):  "VITAMINB12", "FOLATE", "FERRITIN", "TIBC", "IRON", "RETICCTPCT" in the last 72 hours. Sepsis Labs: Recent Labs  Lab 04/07/23 1607 04/07/23 1825 04/08/23 0414  WBC 5.3  --  4.0  LATICACIDVEN 4.6* 2.8*  --     Microbiology Recent Results (from the past 240 hours)  Blood Culture (routine x 2)     Status:  None (Preliminary result)   Collection Time: 04/07/23  4:07 PM   Specimen: BLOOD  Result Value Ref Range Status   Specimen Description BLOOD BLOOD RIGHT ARM  Final   Special Requests   Final    BOTTLES DRAWN AEROBIC AND ANAEROBIC Blood Culture results may not be optimal due to an inadequate volume of blood received in culture bottles   Culture   Final    NO GROWTH < 24 HOURS Performed at [facility], [address], [place], [place] [address]    Report Status PENDING  Incomplete  Blood Culture (routine x 2)     Status: None (Preliminary result)   Collection Time: 04/07/23  4:07 PM   Specimen: BLOOD  Result Value Ref Range Status   Specimen Description BLOOD BLOOD RIGHT ARM  Final   Special Requests   Final    BOTTLES DRAWN AEROBIC AND ANAEROBIC Blood Culture results may not be optimal due to an inadequate volume of blood received in culture bottles   Culture   Final    NO GROWTH < 24 HOURS Performed at [facility], [address], [place], [place] [address]    Report Status PENDING  Incomplete  Resp panel by RT-PCR (RSV, Flu A&B, Covid) Anterior Nasal Swab     Status: None   Collection Time: 04/07/23  4:16 PM   Specimen: Anterior Nasal Swab  Result Value Ref Range Status   SARS Coronavirus 2 by RT PCR NEGATIVE NEGATIVE Final    Comment: (NOTE) SARS-CoV-2 target nucleic acids are NOT DETECTED.  The SARS-CoV-2 RNA is generally detectable in upper respiratory specimens during the acute phase of infection. The lowest concentration of SARS-CoV-2 viral copies this assay can detect is 138 copies/mL. A negative result does not preclude SARS-Cov-2 infection  and should not be used as the sole basis for treatment or other patient management decisions. A negative result may occur with  improper specimen collection/handling, submission of specimen other than nasopharyngeal swab, presence of viral mutation(s) within the areas targeted by this assay, and inadequate number of viral copies(<138 copies/mL). A negative result must be combined with clinical observations, patient history, and epidemiological information. The expected result is Negative.  Fact Sheet for Patients:  BloggerCourse.com  Fact Sheet for Healthcare Providers:  SeriousBroker.it  This test is no t yet approved or cleared by the [place] FDA and  has been authorized for detection and/or diagnosis of SARS-CoV-2 by FDA under an Emergency Use Authorization (EUA). This EUA will remain  in effect (meaning this test can be used) for the duration of the COVID-19 declaration under Section 564(b)(1) of the Act, 21 U.S.C.section 360bbb-3(b)(1), unless the authorization is terminated  or revoked sooner.       Influenza A by PCR NEGATIVE NEGATIVE Final   Influenza B by PCR NEGATIVE NEGATIVE Final    Comment: (NOTE) The Xpert Xpress SARS-CoV-2/FLU/RSV plus assay is intended as an aid in the diagnosis of influenza from Nasopharyngeal swab specimens and should not be used as a sole basis for treatment. Nasal washings and aspirates are unacceptable for Xpert Xpress SARS-CoV-2/FLU/RSV testing.  Fact Sheet for Patients: BloggerCourse.com  Fact Sheet for Healthcare Providers: SeriousBroker.it  This test is not yet approved or cleared by the [place] FDA and has been authorized for detection and/or diagnosis of SARS-CoV-2 by FDA under an Emergency Use Authorization (EUA). This EUA will remain in effect (meaning this test can be used) for the duration of the COVID-19 declaration  under  Section 564(b)(1) of the Act, 21 U.S.C. section 360bbb-3(b)(1), unless the authorization is terminated or revoked.     Resp Syncytial Virus by PCR NEGATIVE NEGATIVE Final    Comment: (NOTE) Fact Sheet for Patients: BloggerCourse.com  Fact Sheet for Healthcare Providers: SeriousBroker.it  This test is not yet approved or cleared by the [place] FDA and has been authorized for detection and/or diagnosis of SARS-CoV-2 by FDA under an Emergency Use Authorization (EUA). This EUA will remain in effect (meaning this test can be used) for the duration of the COVID-19 declaration under Section 564(b)(1) of the Act, 21 U.S.C. section 360bbb-3(b)(1), unless the authorization is terminated or revoked.  Performed at [facility], [address] [place], [place] [address]     Procedures and diagnostic studies:  US Venous Img Lower Bilateral (DVT) Result Date: 04/07/2023 CLINICAL DATA:  Acute pulmonary embolus EXAM: BILATERAL LOWER EXTREMITY VENOUS DOPPLER ULTRASOUND TECHNIQUE: Gray-scale sonography with compression, as well as color and duplex ultrasound, were performed to evaluate the deep venous system(s) from the level of the common femoral vein through the popliteal and proximal calf veins. COMPARISON:  None Available. FINDINGS: VENOUS Within the left lower extremity, there is normal compressibility of the common femoral, superficial femoral, and popliteal veins, as well as the visualized calf veins. Visualized portions of profunda femoral vein and great saphenous vein unremarkable. No filling defects to suggest DVT on grayscale or color Doppler imaging. Doppler waveforms show normal direction of venous flow, normal respiratory plasticity and response to augmentation. Within the right lower extremity, the common femoral vein is widely patent with appropriate antegrade flow, compressibility, and respiratory variation. Visualized  profundus femoral vein and greater saphenous vein and saphenofemoral junction patent. The femoral vein demonstrates focal, anechoic, occlusive thrombus within the mid segment and nonocclusive DVT within the distal segment. There is lack of appropriate augmentation within the more proximal femoral vein in keeping with intervening occlusion. The popliteal vein and infrapopliteal venous vasculature appears widely patent. OTHER None. Limitations: none IMPRESSION: 1. Occlusive DVT within the mid segment of the right femoral vein and nonocclusive DVT within the distal segment. 2. No evidence of left lower extremity DVT. Electronically Signed   By: Helyn Numbers M.D.   On: 04/07/2023 22:15   CT Angio Chest PE W and/or Wo Contrast Result Date: 04/07/2023 CLINICAL DATA:  Pulmonary embolus suspected with high probability. EXAM: CT ANGIOGRAPHY CHEST WITH CONTRAST TECHNIQUE: Multidetector CT imaging of the chest was performed using the standard protocol during bolus administration of intravenous contrast. Multiplanar CT image reconstructions and MIPs were obtained to evaluate the vascular anatomy. RADIATION DOSE REDUCTION: This exam was performed according to the departmental dose-optimization program which includes automated exposure control, adjustment of the mA and/or kV according to patient size and/or use of iterative reconstruction technique. CONTRAST:  75mL OMNIPAQUE IOHEXOL 350 MG/ML SOLN COMPARISON:  PET-CT 03/22/2023.  CT chest 12/07/2021 FINDINGS: Cardiovascular: Technically adequate study with good opacification of the central and segmental pulmonary arteries. Filling defects are demonstrated in bilateral main pulmonary arteries, extending into upper, middle, and lower lobe branches with moderate clot burden. This is consistent with acute pulmonary embolus. RV to LV ratio is 0.9, borderline for possible right heart strain. Normal caliber thoracic aorta. Calcification of the aorta and coronary arteries.  Mediastinum/Nodes: Esophagus is decompressed. No significant lymphadenopathy. Thyroid gland is unremarkable. Cardiac pacemaker. Lungs/Pleura: Emphysematous changes in the lungs. Atelectasis in the lung bases. No pleural effusion or pneumothorax. Upper Abdomen: No acute abnormalities. Musculoskeletal: Multiple sclerotic  lesions demonstrated in the spine, sternum, and ribs consistent with known metastatic disease. Review of the MIP images confirms the above findings. IMPRESSION: 1. Positive examination for multiple bilateral pulmonary emboli. RV to LV ratio is 0.9, borderline for right heart strain. Clinical correlation recommended. 2. Known skeletal metastases are demonstrated. 3. Emphysematous changes in the lungs. 4. Aortic atherosclerosis Critical Value/emergent results were called by telephone at the time of interpretation on 04/07/2023 at 6:30 pm to provider [facility] , who verbally acknowledged these results. Electronically Signed   By: Burman Nieves M.D.   On: 04/07/2023 18:44   CT HEAD WO CONTRAST ( ) Result Date: 04/07/2023 CLINICAL DATA:  New onset headache EXAM: CT HEAD WITHOUT CONTRAST TECHNIQUE: Contiguous axial images were obtained from the base of the skull through the vertex without intravenous contrast. RADIATION DOSE REDUCTION: This exam was performed according to the departmental dose-optimization program which includes automated exposure control, adjustment of the mA and/or kV according to patient size and/or use of iterative reconstruction technique. COMPARISON:  01/16/2023 FINDINGS: Brain: Stable chronic small-vessel ischemic changes throughout the white matter. No evidence of acute infarct or hemorrhage. Lateral ventricles and midline structures are stable. No acute extra-axial fluid collections. No mass effect. Stable cerebral atrophy. Vascular: No hyperdense vessel or unexpected calcification. Skull: Normal. Negative for fracture or focal lesion. Sinuses/Orbits: No acute finding.  Other: None. IMPRESSION: 1. Stable head CT, no acute process. Electronically Signed   By: Sharlet Salina M.D.   On: 04/07/2023 18:28   DG Chest Port 1 View Result Date: 04/07/2023 CLINICAL DATA:  Question of sepsis. New onset shortness of breath. Rigors. Altered mental status. EXAM: PORTABLE CHEST 1 VIEW COMPARISON:  11/26/2022 FINDINGS: Cardiac pacemaker. Shallow inspiration. Atelectasis or infiltration in the lung bases. Mild cardiac enlargement. No vascular congestion or edema. Calcification of the aorta. Mediastinal contours appear intact. IMPRESSION: Cardiac enlargement. Shallow inspiration with infiltration or atelectasis in the lung bases. Electronically Signed   By: Burman Nieves M.D.   On: 04/07/2023 17:07               LOS: 1 day   Yoselin Amerman  [facility]   Pager on [email or web address]. If 7PM-7AM, please contact night-coverage at [email or web address]     04/08/2023, 12:13 PM

## 2023-04-09 DIAGNOSIS — I2699 Other pulmonary embolism without acute cor pulmonale: Secondary | ICD-10-CM | POA: Diagnosis not present

## 2023-04-09 LAB — CBC
HCT: 30 % — ABNORMAL LOW (ref 39.0–52.0)
Hemoglobin: 9.6 g/dL — ABNORMAL LOW (ref 13.0–17.0)
MCH: 32 pg (ref 26.0–34.0)
MCHC: 32 g/dL (ref 30.0–36.0)
MCV: 100 fL (ref 80.0–100.0)
Platelets: 168 10*3/uL (ref 150–400)
RBC: 3 MIL/uL — ABNORMAL LOW (ref 4.22–5.81)
RDW: 15.6 % — ABNORMAL HIGH (ref 11.5–15.5)
WBC: 3.9 10*3/uL — ABNORMAL LOW (ref 4.0–10.5)
nRBC: 0.5 % — ABNORMAL HIGH (ref 0.0–0.2)

## 2023-04-09 LAB — BASIC METABOLIC PANEL
Anion gap: 12 (ref 5–15)
BUN: 21 mg/dL (ref 8–23)
CO2: 23 mmol/L (ref 22–32)
Calcium: 8.6 mg/dL — ABNORMAL LOW (ref 8.9–10.3)
Chloride: 105 mmol/L (ref 98–111)
Creatinine, Ser: 1 mg/dL (ref 0.61–1.24)
GFR, Estimated: >60 mL/min (ref >60)
Glucose, Bld: 286 mg/dL — ABNORMAL HIGH (ref 70–99)
Potassium: 3.5 mmol/L (ref 3.5–5.1)
Sodium: 140 mmol/L (ref 135–145)

## 2023-04-09 LAB — LACTIC ACID, PLASMA: Lactic Acid, Venous: 1.1 mmol/L (ref 0.5–1.9)

## 2023-04-09 LAB — CBG MONITORING, ED
Glucose-Capillary: 271 mg/dL — ABNORMAL HIGH (ref 70–99)
Glucose-Capillary: 421 mg/dL — ABNORMAL HIGH (ref 70–99)

## 2023-04-09 LAB — HEPARIN LEVEL (UNFRACTIONATED): Heparin Unfractionated: 0.38 [IU]/mL (ref 0.30–0.70)

## 2023-04-09 LAB — GLUCOSE, CAPILLARY
Glucose-Capillary: 307 mg/dL — ABNORMAL HIGH (ref 70–99)
Glucose-Capillary: 312 mg/dL — ABNORMAL HIGH (ref 70–99)

## 2023-04-09 MED ORDER — APIXABAN 5 MG PO TABS
10.0000 mg | ORAL_TABLET | Freq: Two times a day (BID) | ORAL | Status: DC
  Administered 2023-04-09 – 2023-04-11 (×5): 10 mg via ORAL
  Filled 2023-04-09 (×5): qty 2

## 2023-04-09 MED ORDER — APIXABAN 5 MG PO TABS: 5.0000 mg | ORAL_TABLET | Freq: Two times a day (BID) | ORAL | Status: DC

## 2023-04-09 MED ORDER — INSULIN GLARGINE-YFGN 100 UNIT/ML ~~LOC~~ SOLN
5.0000 [IU] | Freq: Every day | SUBCUTANEOUS | Status: DC
  Administered 2023-04-09: 5 [IU] via SUBCUTANEOUS
  Filled 2023-04-09 (×2): qty 0.05

## 2023-04-09 NOTE — Consult Note (Signed)
PHARMACY - ANTICOAGULATION CONSULT NOTE  Pharmacy Consult for Heparin Indication: pulmonary embolus  No Known Allergies  Patient Measurements: Height: 5\' 5"  (165.1 cm) Weight: 85 kg (187 lb 6.3 oz) IBW/kg (Calculated) : 61.5 Heparin Dosing Weight: 79.3 kg  Vital Signs: Temp: 98.8 F (37.1 C) (02/02 0516) Temp Source: Oral (02/02 0516) BP: 145/86 (02/02 0516) Pulse Rate: 94 (02/02 0516)  Labs: Recent Labs    04/07/23 1607 04/07/23 1825 04/08/23 0414 04/08/23 1423 04/09/23 0519  HGB 10.3*  --  9.7*  --  9.6*  HCT 32.8*  --  29.7*  --  30.0*  PLT 180  --  198  --  168  APTT 30  --   --   --   --   LABPROT 14.1  --   --   --   --   INR 1.1  --   --   --   --   HEPARINUNFRC  --   --  0.55 0.69 0.38  CREATININE 1.16  --  1.09  --  1.00  CKTOTAL 42*  --   --   --   --   TROPONINIHS 39* 49*  --   --   --    Estimated Creatinine Clearance: 64 mL/min (by C-G formula based on SCr of 1 mg/dL).  Medical History: No DOAC PTA.  Assessment: 78 year old male presented to the ED with new onset SOB, rigors and altered mental. PMH of DM, CHF, stroke. Chest CT: Positive examination for multiple bilateral pulmonary emboli. RV to LV ratio is 0.9, borderline for right heart strain. Pharmacy was consulted to dose and manage this patient's heparin.   Baseline Labs: PT/INR 14.1/1.1 Hgb 10.3 Plt 180  Goal of Therapy:  Heparin level 0.3-0.7 units/ml Monitor platelets by anticoagulation protocol: Yes  Heparin Levels Date/Time HL Clinical Assessment  2/1@0414  HL = 0.55 Therapeutic x 1  2/1@1423  HL = 0.69 Therapeutic x 2  2/2@0519  HL = 0.38 Therapeutic x 3           Plan:  Heparin is Therapeutic x 3 Continue heparin gtt at 1,250 units/hr Re-check HL with AM labs  Monitor CBC daily while on heparin    Lakeena Downie D, PharmD 04/09/2023 6:05 AM

## 2023-04-09 NOTE — ED Notes (Signed)
Matthew Sciara, MD about [id number]

## 2023-04-09 NOTE — Progress Notes (Signed)
Patient received from ED via bed in stable condition .patient is alert and oriented X 1. Denies any pain.

## 2023-04-09 NOTE — Progress Notes (Addendum)
Progress Note    Matthew Mcgrath  WGN:[id number] DOB: 1945-06-22  DOA: 04/07/2023 PCP: [facility]      Brief Narrative:    Medical records reviewed and are as summarized below:  Matthew Mcgrath is a 78 y.o. 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       Assessment/Plan:   Principal Problem:   Acute pulmonary embolism (HCC) Active Problems:   DVT (deep venous thrombosis) (HCC)   Chronic systolic heart failure (HCC)   Essential (primary) hypertension   Stroke (HCC)   Hypomagnesemia   Elevated lactic acid level   Myocardial injury   Prostate cancer (HCC)   Acute metabolic encephalopathy    Body mass index is 31.18 kg/m.    Acute pulmonary embolism (HCC) and right femoral DVT (deep venous thrombosis) (HCC):  Discontinue IV heparin drip.  Start full dose Eliquis for treatment of DVT and PE.  Aspirin has been discontinued. No surgical intervention required per vascular surgeon. 2D echo is pending   Acute hypoxemic respiratory failure Improved.  He is tolerating room air.    Chronic systolic heart failure (HCC):  2D echo on 11/27/2022 showed EF of 40-45%.   Continue oral Lasix and spironolactone   Hypomagnesemia Improved     Elevated lactic acid level: Lactic acid of 4.6, no signs for infection.  No fever or leukocytosis, may be due to hypoxia. -Patient received 500 cc normal saline in the ED    Myocardial injury: Troponin 39- > 49, likely due to demand ischemia secondary PE   Type 2 diabetes mellitus with hyperglycemia Start Semglee at 5 units daily. Use NovoLog as needed for hyperglycemia Hemoglobin A1c was 8.8    Prostate cancer [facility]) admitted with  sepsis due to bones (ribs, thoracic, lumbar spine and pelvis) Continue Flomax, Lynparza He was on leuprolide prior to admission.    Acute metabolic encephalopathy: Mental status improved to baseline. He has underlying cognitive impairment. CT head negative for acute intracranial abnormalities.      Chronic debility Glenda, sister, stated that patient cannot function at home anymore.  He said he can no longer feed himself.  He also said that he has been wheelchair-bound for about a year.  She wants patient to go to nursing home for rehab.  PACE physician also requested rehab. Consult PT and OT.    Comorbidities include history of stroke, hypertension Aspirin has been discontinued.   Transfer from progressive cardiac unit status to MedSurg status          Diet Order             Diet Heart Room service appropriate? Yes; Fluid consistency: Thin  Diet effective now                            Consultants: Vascular surgeon  Procedures: None    Medications:    apixaban  10 mg Oral BID   Followed by   Melene Muller ON 04/16/2023] apixaban  5 mg Oral BID   carvedilol  3.125 mg Oral BID WC   cholecalciferol  1,000 Units Oral Daily   furosemide  20 mg Oral Daily   insulin aspart  0-5 Units Subcutaneous QHS   insulin aspart  0-9 Units Subcutaneous TID WC   insulin glargine-yfgn  5 Units Subcutaneous Daily   multivitamin with minerals  1 tablet Oral Daily   olaparib  100 mg Oral BID   And   olaparib  150 mg Oral BID   pantoprazole  40 mg Oral Daily   senna  2 tablet Oral Daily   spironolactone  12.5 mg Oral Daily   tamsulosin  0.4 mg Oral Daily   Continuous Infusions:     Anti-infectives (From admission, onward)    None              Family Communication/Anticipated D/C date and plan/Code Status   DVT prophylaxis:  apixaban (ELIQUIS) tablet 10 mg  apixaban (ELIQUIS) tablet 5 mg     Code Status: Full Code  Family Communication: Plan  discussed with Bonita Quin, sister, at the bedside Disposition Plan: Plan to discharge home versus SNF   Status is: Inpatient Remains inpatient appropriate because: Acute pulmonary embolism       Subjective:   Interval events noted.  No chest pain or shortness of breath.  He feels better.  Bonita Quin, RN, was at the bedside.  Glenda, sister, was also at the bedside.  Objective:    Vitals:   04/09/23 0516 04/09/23 0630 04/09/23 0743 04/09/23 1105  BP: (!) 145/86 134/87  130/80  Pulse: 94 94 89 88  Resp: (!) 23 18  18   Temp: 98.8 F (37.1 C)  98 F (36.7 C) 98 F (36.7 C)  TempSrc: Oral  Oral Oral  SpO2: 100% 97% 96% 96%  Weight:   85 kg   Height:   5\' 5"  (1.651 m)    No data found.  No intake or output data in the 24 hours ending 04/09/23 1109 Filed Weights   04/07/23 1555 04/09/23 0743  Weight: 85 kg 85 kg    Exam:  GEN: NAD SKIN: Warm and dry EYES: No pallor or icterus ENT: MMM CV: RRR PULM: CTA B ABD: soft, ND, NT, +BS CNS: AAO x 1 (person), non focal EXT: No edema or tenderness      Data Reviewed:   I have personally reviewed following labs and imaging studies:  Labs: Labs show the following:   Basic Metabolic Panel: Recent Labs  Lab 04/07/23 1607 04/08/23 0414 04/09/23 0519  NA 139 140 140  K 3.8 4.2 3.5  CL 101 103 105  CO2 20* 25 23  GLUCOSE 345* 326* 286*  BUN 28* 26* 21  CREATININE 1.16 1.09 1.00  CALCIUM 9.1 8.7* 8.6*  MG 1.6* 2.1  --   PHOS 3.7  --   --    GFR Estimated Creatinine Clearance: 64 mL/min (by C-G formula based on SCr of 1 mg/dL). Liver Function Tests: Recent Labs  Lab 04/07/23 1607  AST 21  ALT 10  ALKPHOS 101  BILITOT 0.7  PROT 7.1  ALBUMIN 3.5   Recent Labs  Lab 04/07/23 1607  LIPASE 33   No results for input(s): "AMMONIA" in the last 168 hours. Coagulation profile Recent Labs  Lab 04/07/23 1607  INR 1.1    CBC: Recent Labs  Lab 04/07/23 1607 04/08/23 0414 04/09/23 0519  WBC 5.3 4.0 3.9*   NEUTROABS 4.8  --   --   HGB 10.3* 9.7* 9.6*  HCT 32.8* 29.7* 30.0*  MCV 100.3* 99.3 100.0  PLT 180 198 168   Cardiac Enzymes: Recent Labs  Lab 04/07/23 1607  CKTOTAL 42*   BNP (last 3 results) No results  for input(s): "PROBNP" in the last 8760 hours. CBG: Recent Labs  Lab 04/08/23 1245 04/08/23 1422 04/08/23 1635 04/08/23 2145 04/09/23 0821  GLUCAP 187* 134* 291* 325* 271*   D-Dimer: No results for input(s): "DDIMER" in the last 72 hours. Hgb A1c: Recent Labs    04/08/23 0414  HGBA1C 8.8*   Lipid Profile: No results for input(s): "CHOL", "HDL", "LDLCALC", "TRIG", "CHOLHDL", "LDLDIRECT" in the last 72 hours. Thyroid function studies: Recent Labs    04/07/23 1607  TSH 1.565   Anemia work up: No results for input(s): "VITAMINB12", "FOLATE", "FERRITIN", "TIBC", "IRON", "RETICCTPCT" in the last 72 hours. Sepsis Labs: Recent Labs  Lab 04/07/23 1607 04/07/23 1825 04/08/23 0414 04/09/23 0519  WBC 5.3  --  4.0 3.9*  LATICACIDVEN 4.6* 2.8*  --  1.1    Microbiology Recent Results (from the past 240 hours)  Blood Culture (routine x 2)     Status: None (Preliminary result)   Collection Time: 04/07/23  4:07 PM   Specimen: BLOOD  Result Value Ref Range Status   Specimen Description BLOOD BLOOD RIGHT ARM  Final   Special Requests   Final    BOTTLES DRAWN AEROBIC AND ANAEROBIC Blood Culture results may not be optimal due to an inadequate volume of blood received in culture bottles   Culture   Final    NO GROWTH 2 DAYS Performed at [facility], [address] [place], [place] [address]    Report Status PENDING  Incomplete  Blood Culture (routine x 2)     Status: None (Preliminary result)   Collection Time: 04/07/23  4:07 PM   Specimen: BLOOD  Result Value Ref Range Status   Specimen Description BLOOD BLOOD RIGHT ARM  Final   Special Requests   Final    BOTTLES DRAWN AEROBIC AND ANAEROBIC Blood Culture results may not be optimal due to an  inadequate volume of blood received in culture bottles   Culture   Final    NO GROWTH 2 DAYS Performed at [facility], [address], [place], [place] [address]    Report Status PENDING  Incomplete  Resp panel by RT-PCR (RSV, Flu A&B, Covid) Anterior Nasal Swab     Status: None   Collection Time: 04/07/23  4:16 PM   Specimen: Anterior Nasal Swab  Result Value Ref Range Status   SARS Coronavirus 2 by RT PCR NEGATIVE NEGATIVE Final    Comment: (NOTE) SARS-CoV-2 target nucleic acids are NOT DETECTED.  The SARS-CoV-2 RNA is generally detectable in upper respiratory specimens during the acute phase of infection. The lowest concentration of SARS-CoV-2 viral copies this assay can detect is 138 copies/mL. A negative result does not preclude SARS-Cov-2 infection and should not be used as the sole basis for treatment or other patient management decisions. A negative result may occur with  improper specimen collection/handling, submission of specimen other than nasopharyngeal swab, presence of viral mutation(s) within the areas targeted by this assay, and inadequate number of viral copies(<138 copies/mL). A negative result must be combined with clinical observations, patient history, and epidemiological information. The expected result is Negative.  Fact Sheet for Patients:  BloggerCourse.com  Fact Sheet for Healthcare Providers:  SeriousBroker.it  This test is no t yet approved or cleared by the [place] FDA and  has been authorized for detection and/or diagnosis of SARS-CoV-2 by FDA under an Emergency Use Authorization (EUA). This EUA will remain  in effect (meaning this test can be used) for the duration  of the COVID-19 declaration under Section 564(b)(1) of the Act, 21 U.S.C.section 360bbb-3(b)(1), unless the authorization is terminated  or revoked sooner.       Influenza A by PCR NEGATIVE NEGATIVE Final    Influenza B by PCR NEGATIVE NEGATIVE Final    Comment: (NOTE) The Xpert Xpress SARS-CoV-2/FLU/RSV plus assay is intended as an aid in the diagnosis of influenza from Nasopharyngeal swab specimens and should not be used as a sole basis for treatment. Nasal washings and aspirates are unacceptable for Xpert Xpress SARS-CoV-2/FLU/RSV testing.  Fact Sheet for Patients: BloggerCourse.com  Fact Sheet for Healthcare Providers: SeriousBroker.it  This test is not yet approved or cleared by the [place] FDA and has been authorized for detection and/or diagnosis of SARS-CoV-2 by FDA under an Emergency Use Authorization (EUA). This EUA will remain in effect (meaning this test can be used) for the duration of the COVID-19 declaration under Section 564(b)(1) of the Act, 21 U.S.C. section 360bbb-3(b)(1), unless the authorization is terminated or revoked.     Resp Syncytial Virus by PCR NEGATIVE NEGATIVE Final    Comment: (NOTE) Fact Sheet for Patients: BloggerCourse.com  Fact Sheet for Healthcare Providers: SeriousBroker.it  This test is not yet approved or cleared by the [place] FDA and has been authorized for detection and/or diagnosis of SARS-CoV-2 by FDA under an Emergency Use Authorization (EUA). This EUA will remain in effect (meaning this test can be used) for the duration of the COVID-19 declaration under Section 564(b)(1) of the Act, 21 U.S.C. section 360bbb-3(b)(1), unless the authorization is terminated or revoked.  Performed at [facility], [address] [place], [place] [address]     Procedures and diagnostic studies:  US Venous Img Lower Bilateral (DVT) Result Date: 04/07/2023 CLINICAL DATA:  Acute pulmonary embolus EXAM: BILATERAL LOWER EXTREMITY VENOUS DOPPLER ULTRASOUND TECHNIQUE: Gray-scale sonography with compression, as well as color and duplex  ultrasound, were performed to evaluate the deep venous system(s) from the level of the common femoral vein through the popliteal and proximal calf veins. COMPARISON:  None Available. FINDINGS: VENOUS Within the left lower extremity, there is normal compressibility of the common femoral, superficial femoral, and popliteal veins, as well as the visualized calf veins. Visualized portions of profunda femoral vein and great saphenous vein unremarkable. No filling defects to suggest DVT on grayscale or color Doppler imaging. Doppler waveforms show normal direction of venous flow, normal respiratory plasticity and response to augmentation. Within the right lower extremity, the common femoral vein is widely patent with appropriate antegrade flow, compressibility, and respiratory variation. Visualized profundus femoral vein and greater saphenous vein and saphenofemoral junction patent. The femoral vein demonstrates focal, anechoic, occlusive thrombus within the mid segment and nonocclusive DVT within the distal segment. There is lack of appropriate augmentation within the more proximal femoral vein in keeping with intervening occlusion. The popliteal vein and infrapopliteal venous vasculature appears widely patent. OTHER None. Limitations: none IMPRESSION: 1. Occlusive DVT within the mid segment of the right femoral vein and nonocclusive DVT within the distal segment. 2. No evidence of left lower extremity DVT. Electronically Signed   By: Helyn Numbers M.D.   On: 04/07/2023 22:15   CT Angio Chest PE W and/or Wo Contrast Result Date: 04/07/2023 CLINICAL DATA:  Pulmonary embolus suspected with high probability. EXAM: CT ANGIOGRAPHY CHEST WITH CONTRAST TECHNIQUE: Multidetector CT imaging of the chest was performed using the standard protocol during bolus administration of intravenous contrast. Multiplanar CT image reconstructions and MIPs were obtained to evaluate  the vascular anatomy. RADIATION DOSE REDUCTION: This exam  was performed according to the departmental dose-optimization program which includes automated exposure control, adjustment of the mA and/or kV according to patient size and/or use of iterative reconstruction technique. CONTRAST:  75mL OMNIPAQUE IOHEXOL 350 MG/ML SOLN COMPARISON:  PET-CT 03/22/2023.  CT chest 12/07/2021 FINDINGS: Cardiovascular: Technically adequate study with good opacification of the central and segmental pulmonary arteries. Filling defects are demonstrated in bilateral main pulmonary arteries, extending into upper, middle, and lower lobe branches with moderate clot burden. This is consistent with acute pulmonary embolus. RV to LV ratio is 0.9, borderline for possible right heart strain. Normal caliber thoracic aorta. Calcification of the aorta and coronary arteries. Mediastinum/Nodes: Esophagus is decompressed. No significant lymphadenopathy. Thyroid gland is unremarkable. Cardiac pacemaker. Lungs/Pleura: Emphysematous changes in the lungs. Atelectasis in the lung bases. No pleural effusion or pneumothorax. Upper Abdomen: No acute abnormalities. Musculoskeletal: Multiple sclerotic lesions demonstrated in the spine, sternum, and ribs consistent with known metastatic disease. Review of the MIP images confirms the above findings. IMPRESSION: 1. Positive examination for multiple bilateral pulmonary emboli. RV to LV ratio is 0.9, borderline for right heart strain. Clinical correlation recommended. 2. Known skeletal metastases are demonstrated. 3. Emphysematous changes in the lungs. 4. Aortic atherosclerosis Critical Value/emergent results were called by telephone at the time of interpretation on 04/07/2023 at 6:30 pm to provider [facility] , who verbally acknowledged these results. Electronically Signed   By: Burman Nieves M.D.   On: 04/07/2023 18:44   CT HEAD WO CONTRAST ( ) Result Date: 04/07/2023 CLINICAL DATA:  New onset headache EXAM: CT HEAD WITHOUT CONTRAST TECHNIQUE: Contiguous axial  images were obtained from the base of the skull through the vertex without intravenous contrast. RADIATION DOSE REDUCTION: This exam was performed according to the departmental dose-optimization program which includes automated exposure control, adjustment of the mA and/or kV according to patient size and/or use of iterative reconstruction technique. COMPARISON:  01/16/2023 FINDINGS: Brain: Stable chronic small-vessel ischemic changes throughout the white matter. No evidence of acute infarct or hemorrhage. Lateral ventricles and midline structures are stable. No acute extra-axial fluid collections. No mass effect. Stable cerebral atrophy. Vascular: No hyperdense vessel or unexpected calcification. Skull: Normal. Negative for fracture or focal lesion. Sinuses/Orbits: No acute finding. Other: None. IMPRESSION: 1. Stable head CT, no acute process. Electronically Signed   By: Sharlet Salina M.D.   On: 04/07/2023 18:28   DG Chest Port 1 View Result Date: 04/07/2023 CLINICAL DATA:  Question of sepsis. New onset shortness of breath. Rigors. Altered mental status. EXAM: PORTABLE CHEST 1 VIEW COMPARISON:  11/26/2022 FINDINGS: Cardiac pacemaker. Shallow inspiration. Atelectasis or infiltration in the lung bases. Mild cardiac enlargement. No vascular congestion or edema. Calcification of the aorta. Mediastinal contours appear intact. IMPRESSION: Cardiac enlargement. Shallow inspiration with infiltration or atelectasis in the lung bases. Electronically Signed   By: Burman Nieves M.D.   On: 04/07/2023 17:07               LOS: 2 days   Tekela Garguilo  [facility]   Pager on [email or web address]. If 7PM-7AM, please contact night-coverage at [email or web address]     04/09/2023, 11:09 AM

## 2023-04-09 NOTE — Evaluation (Addendum)
Occupational Therapy Evaluation Patient Details Name: Matthew Mcgrath MRN: [id number] DOB: 06-27-1945 Today's Date: 04/09/2023   History of Present Illness Pt is a 78 y.o. 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   Clinical Impression   Patient/family agreeable to OT/PT co-treatment to maximize safety and participation. Prior to recent hospital stays, pt was able to complete basic self-care and simple meal prep with MOD I using a RW or W/C. Wears depends at baseline. Attends [facility] day program MWF. Family report a recent decline in function including difficulty completing self-feeding and reduced mobility requiring aide to come more often/provide more assistance. Pt presents to acute OT demonstrating impaired ADL performance and functional mobility 2/2 decreased cognition, safety awareness, strength, endurance, and balance (See OT problem list for additional functional deficits). Pt currently functioning at Max/Total A x2 for bed mobility, Max A x2 for STS, and Total A for most ADLs. Pt would benefit from skilled OT services to address noted impairments and functional limitations (see below for any additional details) in order to maximize safety and independence while minimizing falls risk and caregiver burden. Anticipate the need for follow up OT services upon acute hospital DC.     If plan is discharge home, recommend the following: Supervision due to cognitive status;Assistance with cooking/housework;Assist for transportation;Help with stairs or ramp for entrance;Assistance with feeding;Two people to help with walking and/or transfers;Two people to help with bathing/dressing/bathroom;Direct supervision/assist for medications management;Direct supervision/assist for financial management    Functional Status Assessment  Patient has had a  recent decline in their functional status and demonstrates the ability to make significant improvements in function in a reasonable and predictable amount of time.  Equipment Recommendations  Other (comment) (defer to next venue of care)    Recommendations for Other Services       Precautions / Restrictions Precautions Precautions: Fall Restrictions Weight Bearing Restrictions Per Provider Order: No      Mobility Bed Mobility Overal bed mobility: Needs Assistance Bed Mobility: Supine to Sit, Sit to Supine     Supine to sit: Max assist, +2 for physical assistance, HOB elevated Sit to supine: Total assist, +2 for physical assistance   General bed mobility comments: cues for pt to participate as able, ultimately heavy assistance to complete mobility safely    Transfers Overall transfer level: Needs assistance Equipment used: 2 person hand held assist Transfers: Sit to/from Stand Sit to Stand: Max assist, +2 physical assistance           General transfer comment: noted for incontience and wet bed, pt able to stand with PT/OT to allow nursing to change wet bed linens/depends      Balance Overall balance assessment: Needs assistance Sitting-balance support: Feet supported, Bilateral upper extremity supported Sitting balance-Leahy Scale: Fair Sitting balance - Comments: progressed to sitting with CGA, initially needed at least minA     Standing balance-Leahy Scale: Zero         ADL either performed or assessed with clinical judgement   ADL Overall ADL's : Needs assistance/impaired       Lower Body Dressing: Total assistance;Bed level Lower Body Dressing Details (indicate cue type and reason): to don socks Toilet Transfer: Maximal assistance;+2 for physical assistance Toilet Transfer Details (indicate cue type and reason): Simulated with STS from stretcher Toileting- Clothing Manipulation and Hygiene: Total assistance Toileting - Clothing Manipulation  Details  (indicate cue type and reason): Urinary incontinence noted with pt's bed linens/brief found to be wet upon sitting EOB. Total A required for peri care and to change into new brief.             Vision Baseline Vision/History: 1 Wears glasses Patient Visual Report: No change from baseline       Perception         Praxis         Pertinent Vitals/Pain Pain Assessment Pain Assessment: Faces Faces Pain Scale: No hurt     Extremity/Trunk Assessment Upper Extremity Assessment Upper Extremity Assessment: Generalized weakness   Lower Extremity Assessment Lower Extremity Assessment: Generalized weakness       Communication Communication Cueing Techniques: Verbal cues;Tactile cues   Cognition Arousal: Alert Behavior During Therapy: Flat affect       General Comments: A&O to name only [name]"), unable to state DOB. Only able to correctly identify 1/2 sisters present in room.     General Comments       Exercises Other Exercises Other Exercises: OT provided education re: role of OT, OT POC, post acute recs, sitting up for all meals, EOB/OOB mobility with assistance, home/fall safety.    Shoulder Instructions      Home Living Family/patient expects to be discharged to:: Private residence Living Arrangements: Alone Available Help at Discharge: Family;Personal care attendant;Available PRN/intermittently (Aide present daily for "most of the day" per family, pt alone at night.) Type of Home: Apartment Home Access: Level entry     Home Layout: One level     Bathroom Shower/Tub: Chief Strategy Officer: Standard Bathroom Accessibility: Yes   Home Equipment: Agricultural consultant (2 wheels);Shower seat;Wheelchair - manual   Additional Comments: Attends [facility] during the day MWF      Prior Functioning/Environment Prior Level of Function : Needs assist             Mobility Comments: Prior to recent hospital stays, pt was able to ambulate short household  distances with RW. Per family, recent decline in mobility and now needing assistance from aide to get to/from [facility]. Aide assists pt back to bed at night. ADLs Comments: Prior to recent hospital stays,  pt was able to complete basic self-care and simple meal prep with MOD I using a RW or W/C. Wears depends at baseline. Recent decline in function requiring aide to come more often/provide more assistance ("most of the day" per family).        OT Problem List: Decreased strength;Decreased activity tolerance;Impaired balance (sitting and/or standing);Decreased cognition;Decreased safety awareness;Decreased knowledge of use of DME or AE      OT Treatment/Interventions: Self-care/ADL training;Therapeutic exercise;Energy conservation;DME and/or AE instruction;Therapeutic activities;Patient/family education;Balance training    OT Goals(Current goals can be found in the care plan section) Acute Rehab OT Goals Patient Stated Goal: return to PLOF as able OT Goal Formulation: With family Time For Goal Achievement: 04/23/23 Potential to Achieve Goals: Fair   OT Frequency: Min 1X/week    Co-evaluation PT/OT/SLP Co-Evaluation/Treatment: Yes Reason for Co-Treatment: To address functional/ADL transfers;For patient/therapist safety PT goals addressed during session: Mobility/safety with mobility;Balance OT goals addressed during session: ADL's and self-care      AM-PAC OT "6 Clicks" Daily Activity     Outcome Measure Help from another person eating meals?: A Lot Help from another person taking care of personal grooming?: A Lot Help from another person toileting, which includes using toliet, bedpan, or urinal?: Total Help from another person  bathing (including washing, rinsing, drying)?: Total Help from another person to put on and taking off regular upper body clothing?: Total Help from another person to put on and taking off regular lower body clothing?: Total 6 Click Score: 8   End of Session  Equipment Utilized During Treatment: Gait belt Nurse Communication: Mobility status  Activity Tolerance: Patient limited by fatigue Patient left: in bed;with call bell/phone within reach; other (comment) (with nursing staff getting ready to transfer floors)   OT Visit Diagnosis: Other abnormalities of gait and mobility (R26.89);Muscle weakness (generalized) (M62.81)                Time: 4098-1191 OT Time Calculation (min): 15 min Charges:  OT General Charges $OT Visit: 1 Visit OT Evaluation $OT Eval Moderate Complexity: 1 Mod  [facility] MS, OTR/L ascom [phone]  04/09/23, 4:21 PM

## 2023-04-09 NOTE — ED Notes (Signed)
Messaged pharmacy about missing Glargine.

## 2023-04-09 NOTE — Evaluation (Signed)
Physical Therapy Evaluation Patient Details Name: Matthew Mcgrath MRN: [id number] DOB: [birth year] Today's Date: 04/09/2023  History of Present Illness  Pt is a 78 y.o. 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   Clinical Impression  Pt alert, able to state his name to PT and attempt to name his sisters (1/2 correct) but did not speak much during remainder of session, flat affect throughout. Family reported increased difficulty with mobility in the last couple of months. An aide assists daily, "most of the day" per family including helping to bed at night. Able to stand pivot to [facility], depends at baseline and is a [facility] participate (MWF). Ultimately the pt needed maxAx2 for all mobility attempts, able to come up into sitting EOB and with time progressed to sitting with CGA. Sit <> stand maxAx2 for linen/brief change by RN staff, and repositioned in supine.  Overall the patient demonstrated deficits (see "PT Problem List") that impede the patient's functional abilities, safety, and mobility and would benefit from skilled PT intervention.          If plan is discharge home, recommend the following: Two people to help with walking and/or transfers;Two people to help with bathing/dressing/bathroom;Direct supervision/assist for medications management;Help with stairs or ramp for entrance;Assist for transportation;Assistance with feeding;Assistance with cooking/housework;Supervision due to cognitive status   Can travel by private vehicle        Equipment Recommendations Other (comment) (TBD at next venue of care)  Recommendations for Other Services       Functional Status Assessment Patient has had a recent decline in their functional status and demonstrates the ability to make significant improvements in function in a reasonable and  predictable amount of time.     Precautions / Restrictions Precautions Precautions: Fall Restrictions Weight Bearing Restrictions Per Provider Order: No      Mobility  Bed Mobility Overal bed mobility: Needs Assistance Bed Mobility: Supine to Sit, Sit to Supine     Supine to sit: Max assist, +2 for physical assistance, HOB elevated     General bed mobility comments: cues for pt to participate as able, ultimately heavy assistance to complete mobility safely    Transfers Overall transfer level: Needs assistance Equipment used: 2 person hand held assist Transfers: Sit to/from Stand Sit to Stand: Max assist, +2 physical assistance           General transfer comment: noted for incontience and wet bed, pt able to stand with PT/OT to allow nursing to change wet bed linens/depends    Ambulation/Gait                  Stairs            Wheelchair Mobility     Tilt Bed    Modified Rankin (Stroke Patients Only)       Balance Overall balance assessment: Needs assistance Sitting-balance support: Feet supported, Bilateral upper extremity supported Sitting balance-Leahy Scale: Fair Sitting balance - Comments: progressed to sitting with CGA, initially needed at least minA     Standing balance-Leahy Scale: Zero                               Pertinent Vitals/Pain Pain Assessment Pain Assessment: Faces Faces Pain Scale: No hurt    Home Living Family/patient expects to  be discharged to:: Private residence Living Arrangements: Alone Available Help at Discharge: Family;Personal care attendant;Available PRN/intermittently (aide present daily for "most of the day" per family. not present at night.) Type of Home: Apartment Home Access: Level entry       Home Layout: One level Home Equipment: Agricultural consultant (2 wheels);Shower seat;Wheelchair - manual Additional Comments: Attends [facility] during the day MWF    Prior Function                Mobility Comments: per family recent decline in mobility and now needing assistance from aide to get to/from [facility] aide now present most of the day and assists pt back to bed. ADLs Comments: wears a depends at baseline     Extremity/Trunk Assessment                Communication      Cognition Arousal: Alert Behavior During Therapy: Flat affect                                   General Comments: Pt did not speak to therapy besides saying "Ronnie" and attempting to name sisters in room. did smile at therapist once        General Comments      Exercises     Assessment/Plan    PT Assessment Patient needs continued PT services  PT Problem List Decreased strength;Decreased activity tolerance;Decreased balance;Decreased mobility;Decreased knowledge of use of DME;Decreased safety awareness       PT Treatment Interventions DME instruction;Balance training;Gait training;Neuromuscular re-education;Stair training;Functional mobility training;Patient/family education;Therapeutic activities;Therapeutic exercise    PT Goals (Current goals can be found in the Care Plan section)  Acute Rehab PT Goals Patient Stated Goal: to return to PLOF as able PT Goal Formulation: With family Time For Goal Achievement: 04/23/23 Potential to Achieve Goals: Fair    Frequency Min 1X/week     Co-evaluation PT/OT/SLP Co-Evaluation/Treatment: Yes Reason for Co-Treatment: To address functional/ADL transfers;For patient/therapist safety PT goals addressed during session: Mobility/safety with mobility;Balance OT goals addressed during session: ADL's and self-care       AM-PAC PT "6 Clicks" Mobility  Outcome Measure Help needed turning from your back to your side while in a flat bed without using bedrails?: Total Help needed moving from lying on your back to sitting on the side of a flat bed without using bedrails?: Total Help needed moving to and from a bed to a chair (including a  wheelchair)?: Total Help needed standing up from a chair using your arms (e.g., wheelchair or bedside chair)?: Total Help needed to walk in hospital room?: Total Help needed climbing 3-5 steps with a railing? : Total 6 Click Score: 6    End of Session Equipment Utilized During Treatment: Gait belt Activity Tolerance: Other (comment) (session limited by cognition) Patient left: in bed;with family/visitor present;Other (comment) (with nursing staff getting ready to transfer floors) Nurse Communication: Mobility status PT Visit Diagnosis: Other abnormalities of gait and mobility (R26.89);Difficulty in walking, not elsewhere classified (R26.2);Muscle weakness (generalized) (M62.81)    Time: 1914-7829 PT Time Calculation (min) (ACUTE ONLY): 15 min   Charges:   PT Evaluation $PT Eval Moderate Complexity: 1 Mod   PT General Charges $$ ACUTE PT VISIT: 1 Visit       Olga Coaster PT, DPT 2:49 PM,04/09/23

## 2023-04-09 NOTE — Consult Note (Signed)
PHARMACY - ANTICOAGULATION CONSULT NOTE  Pharmacy Consult for Apixaban Indication: pulmonary embolus and DVT  No Known Allergies  Patient Measurements: Height: 5\' 5"  (165.1 cm) Weight: 85 kg (187 lb 6.3 oz) IBW/kg (Calculated) : 61.5 Heparin Dosing Weight: NA  Vital Signs: Temp: 98 F (36.7 C) (02/02 0743) Temp Source: Oral (02/02 0743) BP: 134/87 (02/02 0630) Pulse Rate: 89 (02/02 0743)  Labs: Recent Labs    04/07/23 1607 04/07/23 1825 04/08/23 0414 04/08/23 1423 04/09/23 0519  HGB 10.3*  --  9.7*  --  9.6*  HCT 32.8*  --  29.7*  --  30.0*  PLT 180  --  198  --  168  APTT 30  --   --   --   --   LABPROT 14.1  --   --   --   --   INR 1.1  --   --   --   --   HEPARINUNFRC  --   --  0.55 0.69 0.38  CREATININE 1.16  --  1.09  --  1.00  CKTOTAL 42*  --   --   --   --   TROPONINIHS 39* 49*  --   --   --    Estimated Creatinine Clearance: 64 mL/min (by C-G formula based on SCr of 1 mg/dL).  Medical History: Past Medical History:  Diagnosis Date   Cardiomyopathy (HCC)    Congestive heart failure (HCC)    Diabetes mellitus type 2, controlled (HCC)    Elevated lipids    Family history of breast cancer    Family history of colon cancer    HTN (hypertension)    Medications Hgb 9.6 (baseline around 10) Plt 168  Assessment: Matthew Mcgrath is a 78 yo male who presented with shortness of breath and confusion. They have a medical history significant for metastatic prostate cancer. Pharmacy was consult to start them on apixaban from heparin to manage their DVT/PE.  Plan:  Start apixaban 5 mg BID x 2 for 7 days, the apixaban 5 mg BID   Effie Shy, PharmD Pharmacy Resident  04/09/2023 11:27 AM

## 2023-04-10 ENCOUNTER — Inpatient Hospital Stay
Admit: 2023-04-10 | Discharge: 2023-04-10 | Disposition: A | Discharge status: Home / Self Care | Payer: Medicare (Managed Care) | Attending: Internal Medicine | Admitting: Internal Medicine

## 2023-04-10 DIAGNOSIS — I2699 Other pulmonary embolism without acute cor pulmonale: Secondary | ICD-10-CM | POA: Diagnosis not present

## 2023-04-10 LAB — GLUCOSE, CAPILLARY
Glucose-Capillary: 270 mg/dL — ABNORMAL HIGH (ref 70–99)
Glucose-Capillary: 299 mg/dL — ABNORMAL HIGH (ref 70–99)
Glucose-Capillary: 271 mg/dL — ABNORMAL HIGH (ref 70–99)
Glucose-Capillary: 193 mg/dL — ABNORMAL HIGH (ref 70–99)

## 2023-04-10 MED ORDER — INSULIN GLARGINE-YFGN 100 UNIT/ML ~~LOC~~ SOLN
10.0000 [IU] | Freq: Every day | SUBCUTANEOUS | Status: DC
  Administered 2023-04-10 – 2023-04-11 (×2): 10 [IU] via SUBCUTANEOUS
  Filled 2023-04-10 (×2): qty 0.1

## 2023-04-10 NOTE — Progress Notes (Signed)
*  PRELIMINARY RESULTS* Echocardiogram 2D Echocardiogram has been performed.  Carolyne Fiscal 04/10/2023, 10:00 AM

## 2023-04-10 NOTE — Progress Notes (Signed)
Progress Note    LUIZ TRUMPOWER  ZOX:[id number] DOB: 1946-03-01  DOA: 04/07/2023 PCP: [facility]      Brief Narrative:    Medical records reviewed and are as summarized below:  Matthew Mcgrath is a 78 y.o. 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       Assessment/Plan:   Principal Problem:   Acute pulmonary embolism (HCC) Active Problems:   DVT (deep venous thrombosis) (HCC)   Chronic systolic heart failure (HCC)   Essential (primary) hypertension   Stroke (HCC)   Hypomagnesemia   Elevated lactic acid level   Myocardial injury   Prostate cancer (HCC)   Acute metabolic encephalopathy    Body mass index is 31.18 kg/m.    Acute pulmonary embolism (HCC) and right femoral DVT (deep venous thrombosis) (HCC):  Continue Eliquis.  Initially treated with IV heparin drip. No surgical intervention required per vascular surgeon. 2D echo is pending   Acute hypoxemic respiratory failure Improved.  He is tolerating room air.    Chronic systolic heart failure (HCC):  2D echo on 11/27/2022 showed EF of 40-45%.   Continue oral Lasix and spironolactone   Hypomagnesemia Improved     Elevated lactic acid level: Lactic acid of 4.6, no signs for infection.  No fever or leukocytosis, may be due to hypoxia. -Patient received 500 cc normal saline in the ED    Myocardial injury: Troponin 39- > 49, likely due to demand ischemia secondary PE   Type 2 diabetes mellitus with hyperglycemia Increase Semglee from 5 units to 10 units daily because of persistent hyperglycemia Use NovoLog as needed for hyperglycemia Hemoglobin A1c was 8.8    Prostate cancer [facility]) admitted with sepsis  due to bones (ribs, thoracic, lumbar spine and pelvis) Continue Flomax, Lynparza He was on leuprolide prior to admission.    Acute metabolic encephalopathy: Mental status improved to baseline. He has underlying cognitive impairment. CT head negative for acute intracranial abnormalities.      Chronic debility Glenda, sister, stated that patient cannot function at home anymore.  He said he can no longer feed himself.  He also said that he has been wheelchair-bound for about a year.  She wants patient to go to nursing home for rehab.   PT and OT recommended discharge to SNF.  Awaiting placement.    Comorbidities include history of stroke, hypertension Aspirin has been discontinued.             Diet Order             Diet Heart Room service appropriate? Yes; Fluid consistency: Thin  Diet effective now                            Consultants: Vascular surgeon  Procedures: None    Medications:    apixaban  10 mg Oral BID   Followed by   Melene Muller ON 04/16/2023] apixaban  5 mg Oral BID   carvedilol  3.125 mg Oral BID WC   cholecalciferol  1,000 Units Oral Daily   furosemide  20 mg Oral Daily   insulin aspart  0-5 Units Subcutaneous QHS   insulin aspart  0-9 Units Subcutaneous TID WC   insulin glargine-yfgn  10 Units Subcutaneous Daily   multivitamin with minerals  1 tablet Oral Daily   olaparib  100 mg Oral BID   And   olaparib  150 mg Oral BID   pantoprazole  40 mg Oral Daily   senna  2 tablet Oral Daily   spironolactone  12.5 mg Oral Daily   tamsulosin  0.4 mg Oral Daily   Continuous Infusions:     Anti-infectives (From admission, onward)    None              Family Communication/Anticipated D/C date and plan/Code Status   DVT prophylaxis:  apixaban (ELIQUIS) tablet 10 mg  apixaban (ELIQUIS) tablet 5 mg     Code Status: Full Code  Family Communication: None Disposition Plan: Plan to discharge home versus SNF   Status  is: Inpatient Remains inpatient appropriate because: Awaiting placement to SNF       Subjective:   No acute events overnight.  He complains of pain in the right leg.  Objective:    Vitals:   04/09/23 1500 04/09/23 1644 04/09/23 2010 04/10/23 0844  BP: (!) 141/82 136/82 108/73 139/83  Pulse: 100 (!) 102 (!) 107 93  Resp: (!) 24 17 16 18   Temp: 98.4 F (36.9 C) 98.8 F (37.1 C) 99.8 F (37.7 C) 98.9 F (37.2 C)  TempSrc: Oral     SpO2: 96% 99% 93% 96%  Weight:      Height:       No data found.  No intake or output data in the 24 hours ending 04/10/23 1159 Filed Weights   04/07/23 1555 04/09/23 0743  Weight: 85 kg 85 kg    Exam:   GEN: NAD SKIN: Warm and dry EYES: No pallor or icterus ENT: MMM CV: RRR PULM: CTA B ABD: soft, ND, NT, +BS CNS: AAO x 3, non focal EXT: No edema or tenderness       Data Reviewed:   I have personally reviewed following labs and imaging studies:  Labs: Labs show the following:   Basic Metabolic Panel: Recent Labs  Lab 04/07/23 1607 04/08/23 0414 04/09/23 0519  NA 139 140 140  K 3.8 4.2 3.5  CL 101 103 105  CO2 20* 25 23  GLUCOSE 345* 326* 286*  BUN 28* 26* 21  CREATININE 1.16 1.09 1.00  CALCIUM 9.1 8.7* 8.6*  MG 1.6* 2.1  --   PHOS 3.7  --   --    GFR Estimated Creatinine Clearance: 64 mL/min (by C-G formula based on SCr of 1 mg/dL). Liver Function Tests: Recent Labs  Lab 04/07/23 1607  AST 21  ALT 10  ALKPHOS 101  BILITOT 0.7  PROT 7.1  ALBUMIN 3.5   Recent Labs  Lab 04/07/23 1607  LIPASE 33   No results for input(s): "AMMONIA" in the last 168 hours. Coagulation profile Recent Labs  Lab 04/07/23 1607  INR 1.1    CBC: Recent Labs  Lab 04/07/23 1607 04/08/23 0414 04/09/23 0519  WBC 5.3 4.0 3.9*  NEUTROABS 4.8  --   --   HGB 10.3* 9.7* 9.6*  HCT 32.8* 29.7* 30.0*  MCV 100.3* 99.3 100.0  PLT 180 198 168   Cardiac Enzymes: Recent Labs  Lab 04/07/23 1607  CKTOTAL 42*   BNP  (last 3 results) No results for input(s): "PROBNP" in the last 8760 hours. CBG: Recent Labs  Lab 04/09/23 1200 04/09/23 1650 04/09/23 2003 04/10/23 0840 04/10/23 1143  GLUCAP 421* 307* 312* 270* 299*  D-Dimer: No results for input(s): "DDIMER" in the last 72 hours. Hgb A1c: Recent Labs    04/08/23 0414  HGBA1C 8.8*   Lipid Profile: No results for input(s): "CHOL", "HDL", "LDLCALC", "TRIG", "CHOLHDL", "LDLDIRECT" in the last 72 hours. Thyroid function studies: Recent Labs    04/07/23 1607  TSH 1.565   Anemia work up: No results for input(s): "VITAMINB12", "FOLATE", "FERRITIN", "TIBC", "IRON", "RETICCTPCT" in the last 72 hours. Sepsis Labs: Recent Labs  Lab 04/07/23 1607 04/07/23 1825 04/08/23 0414 04/09/23 0519  WBC 5.3  --  4.0 3.9*  LATICACIDVEN 4.6* 2.8*  --  1.1    Microbiology Recent Results (from the past 240 hours)  Blood Culture (routine x 2)     Status: None (Preliminary result)   Collection Time: 04/07/23  4:07 PM   Specimen: BLOOD  Result Value Ref Range Status   Specimen Description BLOOD BLOOD RIGHT ARM  Final   Special Requests   Final    BOTTLES DRAWN AEROBIC AND ANAEROBIC Blood Culture results may not be optimal due to an inadequate volume of blood received in culture bottles   Culture   Final    NO GROWTH 3 DAYS Performed at [facility], [address] [place], [place] [address]    Report Status PENDING  Incomplete  Blood Culture (routine x 2)     Status: None (Preliminary result)   Collection Time: 04/07/23  4:07 PM   Specimen: BLOOD  Result Value Ref Range Status   Specimen Description BLOOD BLOOD RIGHT ARM  Final   Special Requests   Final    BOTTLES DRAWN AEROBIC AND ANAEROBIC Blood Culture results may not be optimal due to an inadequate volume of blood received in culture bottles   Culture   Final    NO GROWTH 3 DAYS Performed at [facility], [address], [place], [place] [address]    Report Status  PENDING  Incomplete  Resp panel by RT-PCR (RSV, Flu A&B, Covid) Anterior Nasal Swab     Status: None   Collection Time: 04/07/23  4:16 PM   Specimen: Anterior Nasal Swab  Result Value Ref Range Status   SARS Coronavirus 2 by RT PCR NEGATIVE NEGATIVE Final    Comment: (NOTE) SARS-CoV-2 target nucleic acids are NOT DETECTED.  The SARS-CoV-2 RNA is generally detectable in upper respiratory specimens during the acute phase of infection. The lowest concentration of SARS-CoV-2 viral copies this assay can detect is 138 copies/mL. A negative result does not preclude SARS-Cov-2 infection and should not be used as the sole basis for treatment or other patient management decisions. A negative result may occur with  improper specimen collection/handling, submission of specimen other than nasopharyngeal swab, presence of viral mutation(s) within the areas targeted by this assay, and inadequate number of viral copies(<138 copies/mL). A negative result must be combined with clinical observations, patient history, and epidemiological information. The expected result is Negative.  Fact Sheet for Patients:  BloggerCourse.com  Fact Sheet for Healthcare Providers:  SeriousBroker.it  This test is no t yet approved or cleared by the [place] FDA and  has been authorized for detection and/or diagnosis of SARS-CoV-2 by FDA under an Emergency Use Authorization (EUA). This EUA will remain  in effect (meaning this test can be used) for the duration of the COVID-19 declaration under Section 564(b)(1) of the Act, 21 U.S.C.section 360bbb-3(b)(1), unless the authorization is terminated  or revoked sooner.       Influenza A by PCR  NEGATIVE NEGATIVE Final   Influenza B by PCR NEGATIVE NEGATIVE Final    Comment: (NOTE) The Xpert Xpress SARS-CoV-2/FLU/RSV plus assay is intended as an aid in the diagnosis of influenza from Nasopharyngeal swab specimens  and should not be used as a sole basis for treatment. Nasal washings and aspirates are unacceptable for Xpert Xpress SARS-CoV-2/FLU/RSV testing.  Fact Sheet for Patients: BloggerCourse.com  Fact Sheet for Healthcare Providers: SeriousBroker.it  This test is not yet approved or cleared by the [place] FDA and has been authorized for detection and/or diagnosis of SARS-CoV-2 by FDA under an Emergency Use Authorization (EUA). This EUA will remain in effect (meaning this test can be used) for the duration of the COVID-19 declaration under Section 564(b)(1) of the Act, 21 U.S.C. section 360bbb-3(b)(1), unless the authorization is terminated or revoked.     Resp Syncytial Virus by PCR NEGATIVE NEGATIVE Final    Comment: (NOTE) Fact Sheet for Patients: BloggerCourse.com  Fact Sheet for Healthcare Providers: SeriousBroker.it  This test is not yet approved or cleared by the [place] FDA and has been authorized for detection and/or diagnosis of SARS-CoV-2 by FDA under an Emergency Use Authorization (EUA). This EUA will remain in effect (meaning this test can be used) for the duration of the COVID-19 declaration under Section 564(b)(1) of the Act, 21 U.S.C. section 360bbb-3(b)(1), unless the authorization is terminated or revoked.  Performed at [facility], [address] [place], [place] [address]     Procedures and diagnostic studies:  No results found.              LOS: 3 days   Pinkney Venard  [facility]   Pager on [email or web address]. If 7PM-7AM, please contact night-coverage at [email or web address]     04/10/2023, 11:59 AM

## 2023-04-10 NOTE — NC FL2 (Signed)
[place] MEDICAID FL2 LEVEL OF CARE FORM     IDENTIFICATION  Patient Name: Matthew Mcgrath Birthdate: 1945/05/27 Sex: male Admission Date (Current Location): 04/07/2023  [facility] and [place] Number:      Facility and Address:         Provider Number: [id number]  Attending Physician Name and Address:  Lurene Shadow, MD  Relative Name and Phone Number:  Christpoher, Sievers (Sister)  [phone]    Current Level of Care: Hospital Recommended Level of Care: Skilled Nursing Facility Prior Approval Number:    Date Approved/Denied:   PASRR Number: [id number]  Discharge Plan: SNF    Current Diagnoses: Patient Active Problem List   Diagnosis Date Noted   DVT (deep venous thrombosis) (HCC) 04/08/2023   Acute metabolic encephalopathy 04/08/2023   Acute pulmonary embolism (HCC) 04/07/2023   Hypomagnesemia 04/07/2023   Elevated lactic acid level 04/07/2023   Myocardial injury 04/07/2023   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   Stroke (HCC) 11/26/2022   Acute encephalopathy 11/26/2022   Lower extremity weakness 06/28/2022   Cognitive impairment 05/17/2022   Anemia    Sepsis (HCC) 06/19/2021   Gross hematuria 06/19/2021   Complicated UTI (urinary tract infection) 06/18/2021   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   Genetic testing 02/22/2021   Family history of colon cancer 01/13/2021   Family history of breast cancer 01/13/2021   Encounter for antineoplastic chemotherapy 07/06/2020   Metastatic cancer to bone (HCC) 07/06/2020   Metastasis to bone (HCC) 05/28/2020   Prostate cancer (HCC) 05/11/2020   Goals of care, counseling/discussion 05/11/2020   Iliac artery aneurysm (HCC) 04/12/2016   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   Cardiomyopathy (HCC) 04/24/2015   S/P cardiac catheterization 04/24/2015   Chest pain 12/23/2014   Diabetes (HCC) 11/21/2014   Chronic systolic heart failure (HCC) 11/20/2014   Tobacco abuse 11/20/2014   Pedal edema  11/03/2014   Acute on chronic combined systolic and diastolic congestive heart failure (HCC) 09/20/2014   Elevation of level of transaminase and lactic acid dehydrogenase (LDH) 09/20/2014   Essential (primary) hypertension 05/02/2014   Hyperlipidemia 05/02/2014   Shortness of breath 05/02/2014   Urinary tract infection 05/02/2014   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    Orientation RESPIRATION BLADDER Height & Weight     Self  Normal Incontinent Weight: 187 lb 6.3 oz (85 kg) Height:  5\' 5"  (165.1 cm)  BEHAVIORAL SYMPTOMS/MOOD NEUROLOGICAL BOWEL NUTRITION STATUS      Incontinent    AMBULATORY STATUS COMMUNICATION OF NEEDS Skin   Extensive Assist Verbally Normal                       Personal Care Assistance Level of Assistance  Bathing, Feeding, Dressing Bathing Assistance: Maximum assistance Feeding assistance: Maximum assistance Dressing Assistance: Maximum assistance     Functional Limitations Info  Sight, Hearing, Speech Sight Info: Impaired Hearing Info: Adequate Speech Info: Impaired    SPECIAL CARE FACTORS FREQUENCY  PT (By licensed PT), OT (By licensed OT)     PT Frequency: 5 times a week OT Frequency: 5 times a week            Contractures Contractures Info: Not present    Additional Factors Info  Code Status Code Status Info: FULL             Current Medications (04/10/2023):  This is the current hospital active medication list Current Facility-Administered Medications  Medication Dose Route Frequency Provider Last Rate  Last Admin   acetaminophen (TYLENOL) tablet 650 mg  650 mg Oral Q6H PRN Lorretta Harp, MD       albuterol (PROVENTIL) (2.5 MG/3ML) 0.083% nebulizer solution 2.5 mg  2.5 mg Nebulization Q4H PRN Lorretta Harp, MD       apixaban Everlene Balls) tablet 10 mg  10 mg Oral BID Effie Shy, RPH   10 mg at 04/10/23 1610   Followed by   Melene Muller ON 04/16/2023] apixaban (ELIQUIS) tablet 5 mg  5 mg Oral BID Effie Shy,  RPH       carvedilol (COREG) tablet 3.125 mg  3.125 mg Oral BID WC Lorretta Harp, MD   3.125 mg at 04/10/23 9604   cholecalciferol (VITAMIN D3) 25 MCG (1000 UNIT) tablet 1,000 Units  1,000 Units Oral Daily Lorretta Harp, MD   1,000 Units at 04/10/23 0950   dextromethorphan-guaiFENesin (MUCINEX DM) 30-600 MG per 12 hr tablet 1 tablet  1 tablet Oral BID PRN Lorretta Harp, MD       diphenhydrAMINE (BENADRYL) injection 12.5 mg  12.5 mg Intravenous Q8H PRN Lorretta Harp, MD       furosemide (LASIX) tablet 20 mg  20 mg Oral Daily Lurene Shadow, MD   20 mg at 04/10/23 5409   hydrALAZINE (APRESOLINE) injection 5 mg  5 mg Intravenous Q2H PRN Lorretta Harp, MD       insulin aspart (novoLOG) injection 0-5 Units  0-5 Units Subcutaneous QHS Lorretta Harp, MD   4 Units at 04/09/23 2206   insulin aspart (novoLOG) injection 0-9 Units  0-9 Units Subcutaneous TID WC Lorretta Harp, MD   5 Units at 04/10/23 8119   insulin glargine-yfgn (SEMGLEE) injection 10 Units  10 Units Subcutaneous Daily Lurene Shadow, MD       multivitamin with minerals tablet 1 tablet  1 tablet Oral Daily Lorretta Harp, MD   1 tablet at 04/10/23 0950   olaparib [facility]) tablet 100 mg  100 mg Oral BID Lurene Shadow, MD   100 mg at 04/10/23 1478   And   olaparib [facility]) tablet 150 mg  150 mg Oral BID Lurene Shadow, MD   150 mg at 04/10/23 0957   pantoprazole (PROTONIX) EC tablet 40 mg  40 mg Oral Daily Lorretta Harp, MD   40 mg at 04/10/23 2956   senna (SENOKOT) tablet 17.2 mg  2 tablet Oral Daily Lorretta Harp, MD   17.2 mg at 04/10/23 2130   spironolactone (ALDACTONE) tablet 12.5 mg  12.5 mg Oral Daily Lorretta Harp, MD   12.5 mg at 04/10/23 8657   tamsulosin (FLOMAX) capsule 0.4 mg  0.4 mg Oral Daily Lorretta Harp, MD   0.4 mg at 04/10/23 8469     Discharge Medications: Please see discharge summary for a list of discharge medications.  Relevant Imaging Results:  Relevant Lab Results:   Additional Information SSN: [id number]  Allena Katz,  LCSW

## 2023-04-10 NOTE — Progress Notes (Signed)
Physical Therapy Treatment Patient Details Name: Matthew Mcgrath MRN: [id number] DOB: 1945-11-08 Today's Date: 04/10/2023   History of Present Illness Pt is a 78 y.o. 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    PT Comments  Pt with flat affect but alert and with extra time and cuing was able to follow most 1-step commands.  Pt required extensive +2 physical assistance and cuing for sequencing with all functional tasks. Once in sitting the pt presented with min posterior instability but with anterior weight shifting activities the pt's static sitting balance quickly improved to where he required no physical assist to maintain balance.  Pt required heavy +2 assist to come to standing and min to mod +2 assist to maintain standing position with heavy cues for upright posture and increased knee extension.  Pt made multiple attempts to advance a LE but could not do so this session.  Pt was able to perform several low amplitude mini squats with verbal and visual cues for sequencing.  Pt reported no adverse symptoms during the session with SpO2 and HR WNL on room air. Pt will benefit from continued PT services upon discharge to safely address deficits listed in patient problem list for decreased caregiver assistance and eventual return to PLOF.     If plan is discharge home, recommend the following: Two people to help with walking and/or transfers;Two people to help with bathing/dressing/bathroom;Direct supervision/assist for medications management;Help with stairs or ramp for entrance;Assist for transportation;Assistance with feeding;Assistance with cooking/housework;Supervision due to cognitive status   Can travel by private vehicle     No  Equipment Recommendations  Other (comment) (TBD at next venue of care)    Recommendations for Other  Services       Precautions / Restrictions Precautions Precautions: Fall Restrictions Weight Bearing Restrictions Per Provider Order: No     Mobility  Bed Mobility Overal bed mobility: Needs Assistance Bed Mobility: Supine to Sit, Sit to Supine     Supine to sit: Max assist, +2 for physical assistance, HOB elevated Sit to supine: Max assist, +2 for physical assistance   General bed mobility comments: Mod multi-modal cues for use of bed rail to assist    Transfers Overall transfer level: Needs assistance Equipment used: Rolling walker (2 wheels) Transfers: Sit to/from Stand Sit to Stand: Max assist, +2 physical assistance           General transfer comment: Physical assist for foot and hand placement, heavy tactile and verbal cues for increase trunk flexion    Ambulation/Gait               General Gait Details: Pt made multiple attempts to advance a LE while in standing but was unable   Stairs             Wheelchair Mobility     Tilt Bed    Modified Rankin (Stroke Patients Only)       Balance Overall balance assessment: Needs assistance Sitting-balance support: Feet supported, Bilateral upper extremity supported Sitting balance-Leahy Scale: Fair Sitting balance - Comments: Posterior lean requiring min A for stability initially but sitting balance progressed quickly to close SBA   Standing balance support: Bilateral upper extremity supported, Reliant on assistive device for balance Standing balance-Leahy Scale: Poor  Cognition Arousal: Alert Behavior During Therapy: Flat affect                                   General Comments: A&O to name, followed 1-step commands with extra time and cuing        Exercises Total Joint Exercises Long Arc Quad: AAROM, Strengthening, Both, 10 reps Knee Flexion: AAROM, Strengthening, Both, 10 reps Other Exercises: Standing low amplitude mini squats  x 4 Other Exercises: Anterior weight shifting in sitting to address posterior lean    General Comments        Pertinent Vitals/Pain Pain Assessment Pain Assessment: No/denies pain    Home Living                          Prior Function            PT Goals (current goals can now be found in the care plan section) Progress towards PT goals: Progressing toward goals    Frequency    Min 1X/week      PT Plan      Co-evaluation              AM-PAC PT "6 Clicks" Mobility   Outcome Measure  Help needed turning from your back to your side while in a flat bed without using bedrails?: Total Help needed moving from lying on your back to sitting on the side of a flat bed without using bedrails?: Total Help needed moving to and from a bed to a chair (including a wheelchair)?: Total Help needed standing up from a chair using your arms (e.g., wheelchair or bedside chair)?: Total Help needed to walk in hospital room?: Total Help needed climbing 3-5 steps with a railing? : Total 6 Click Score: 6    End of Session Equipment Utilized During Treatment: Gait belt Activity Tolerance: Patient tolerated treatment well Patient left: in bed;with family/visitor present;with call bell/phone within reach;with bed alarm set Nurse Communication: Mobility status PT Visit Diagnosis: Other abnormalities of gait and mobility (R26.89);Difficulty in walking, not elsewhere classified (R26.2);Muscle weakness (generalized) (M62.81)     Time: 1191-4782 PT Time Calculation (min) (ACUTE ONLY): 18 min  Charges:    $Therapeutic Activity: 8-22 mins PT General Charges $$ ACUTE PT VISIT: 1 Visit                     D. Elly Modena PT, DPT 04/10/23, 4:19 PM

## 2023-04-10 NOTE — TOC Initial Note (Signed)
Transition of Care [facility]) - Initial/Assessment Note    Patient Details  Name: Matthew Mcgrath MRN: [id number] Date of Birth: 1946/03/07  Transition of Care [facility]) CM/SW Contact:    Allena Katz, LCSW Phone Number: 04/10/2023, 10:32 AM  Clinical Narrative:   Pt admitted from [facility] clinic. Pt not fully oriented. CSW spoke with [place] from [facility] who states pt is able to walk at baseline and he does experience some cognitive delay at baseline. Charisse March states [facility] is in network with [facility] and [facility]. CSW to start workup.                 Expected Discharge Plan: Skilled Nursing Facility Barriers to Discharge: Continued Medical Work up   Patient Goals and CMS Choice   CMS Medicare.gov Compare Post Acute Care list provided to:: Patient        Expected Discharge Plan and Services                                              Prior Living Arrangements/Services   Lives with:: Self Patient language and need for interpreter reviewed:: Yes Do you feel safe going back to the place where you live?: Yes      Need for Family Participation in Patient Care: Yes (Comment) Care giver support system in place?: Yes (comment) Current home services: DME    Activities of Daily Living   ADL Screening (condition at time of admission) Independently performs ADLs?: No Does the patient have a NEW difficulty with bathing/dressing/toileting/self-feeding that is expected to last >3 days?: Yes (Initiates electronic notice to provider for possible OT consult) Does the patient have a NEW difficulty with getting in/out of bed, walking, or climbing stairs that is expected to last >3 days?: Yes (Initiates electronic notice to provider for possible PT consult) Does the patient have a NEW difficulty with communication that is expected to last >3 days?: No Is the patient deaf or have difficulty hearing?: Yes Does the patient have difficulty seeing, even when wearing glasses/contacts?: Yes Does the patient  have difficulty concentrating, remembering, or making decisions?: Yes  Permission Sought/Granted                  Emotional Assessment       Orientation: : Fluctuating Orientation (Suspected and/or reported Sundowners) Alcohol / Substance Use: Never Used    Admission diagnosis:  Acute pulmonary embolism (HCC) [I26.99] Other acute pulmonary embolism without acute cor pulmonale (HCC) [I26.99] Patient Active Problem List   Diagnosis Date Noted   DVT (deep venous thrombosis) (HCC) 04/08/2023   Acute metabolic encephalopathy 04/08/2023   Acute pulmonary embolism (HCC) 04/07/2023   Hypomagnesemia 04/07/2023   Elevated lactic acid level 04/07/2023   Myocardial injury 04/07/2023   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   Stroke (HCC) 11/26/2022   Acute encephalopathy 11/26/2022   Lower extremity weakness 06/28/2022   Cognitive impairment 05/17/2022   Anemia    Sepsis (HCC) 06/19/2021   Gross hematuria 06/19/2021   Complicated UTI (urinary tract infection) 06/18/2021   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   Genetic testing 02/22/2021   Family history of colon cancer 01/13/2021   Family history of breast cancer 01/13/2021   Encounter for antineoplastic chemotherapy 07/06/2020   Metastatic cancer to bone (HCC) 07/06/2020   Metastasis to bone (HCC) 05/28/2020   Prostate cancer (HCC) 05/11/2020   Goals of care,  counseling/discussion 05/11/2020   Iliac artery aneurysm (HCC) 04/12/2016   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   Cardiomyopathy (HCC) 04/24/2015   S/P cardiac catheterization 04/24/2015   Chest pain 12/23/2014   Diabetes (HCC) 11/21/2014   Chronic systolic heart failure (HCC) 11/20/2014   Tobacco abuse 11/20/2014   Pedal edema 11/03/2014   Acute on chronic combined systolic and diastolic congestive heart failure (HCC) 09/20/2014   Elevation of level of transaminase and lactic acid dehydrogenase (LDH) 09/20/2014   Essential (primary)  hypertension 05/02/2014   Hyperlipidemia 05/02/2014   Shortness of breath 05/02/2014   Urinary tract infection 05/02/2014   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   PCP:  [facility] Pharmacy:   [name] - [place], [place] - [address] [place] [address] Phone: [phone] Fax: [phone]     Social Drivers of Health (SDOH) Social History: SDOH Screenings   Food Insecurity: No Food Insecurity (04/09/2023)  Housing: Unknown (04/09/2023)  Transportation Needs: No Transportation Needs (04/09/2023)  Utilities: Not At Risk (04/09/2023)  Social Connections: Unknown (04/09/2023)  Tobacco Use: Medium Risk (04/07/2023)   SDOH Interventions:     Readmission Risk Interventions    06/21/2021   11:22 AM  Readmission Risk Prevention Plan  Transportation Screening Complete  PCP or Specialist Appt within 5-7 Days Complete  Home Care Screening Complete  Medication Review (RN CM) Complete

## 2023-04-11 ENCOUNTER — Inpatient Hospital Stay (HOSPITAL_COMMUNITY): Admission: RE | Admit: 2023-04-11 | Payer: Medicare (Managed Care) | Source: Ambulatory Visit

## 2023-04-11 DIAGNOSIS — I2699 Other pulmonary embolism without acute cor pulmonale: Secondary | ICD-10-CM | POA: Diagnosis not present

## 2023-04-11 LAB — GLUCOSE, CAPILLARY
Glucose-Capillary: 231 mg/dL — ABNORMAL HIGH (ref 70–99)
Glucose-Capillary: 419 mg/dL — ABNORMAL HIGH (ref 70–99)

## 2023-04-11 MED ORDER — APIXABAN 5 MG PO TABS: ORAL_TABLET | ORAL | Status: DC

## 2023-04-11 NOTE — TOC Transition Note (Signed)
 Transition of Care [facility]) - Discharge Note   Patient Details  Name: Matthew Mcgrath MRN: [id number] Date of Birth: 07-22-1945  [facility]) CM/SW Contact:  Ladene Lady, LCSW Phone Number: 04/11/2023, 12:08 PM   Clinical Narrative: [facility] and want to go here now. [facility] to [facility]. Rn given number for report. Pt going to room e8. DC summary to be sent once in. [facility] to transport    Final next level of care: Skilled Nursing Facility Barriers to Discharge: Barriers Resolved   Patient Goals and CMS Choice   CMS Medicare.gov Compare Post Acute Care list provided to:: Patient Represenative (must comment) Mort)        Discharge Placement              [facility] Patient to be transferred to facility by: [facility] Name of family member notified: Glenda Patient and family notified of of transfer: 04/11/23  Discharge Plan and Services Additional resources added to the After Visit Summary for                                       Social Drivers of Health (SDOH) Interventions SDOH Screenings   Food Insecurity: No Food Insecurity (04/09/2023)  Housing: Unknown (04/09/2023)  Transportation Needs: No Transportation Needs (04/09/2023)  Utilities: Not At Risk (04/09/2023)  Social Connections: Unknown (04/09/2023)  Tobacco Use: Medium Risk (04/07/2023)     Readmission Risk Interventions    06/21/2021   11:22 AM  Readmission Risk Prevention Plan  Transportation Screening Complete  PCP or Specialist Appt within 5-7 Days Complete  Home Care Screening Complete  Medication Review (RN CM) Complete

## 2023-04-11 NOTE — Discharge Summary (Addendum)
 Physician Discharge Summary   Patient: Matthew Mcgrath MRN: [id number] DOB: [birth year]  Admit date:     04/07/2023  Discharge date: 04/11/23  Discharge Physician: AIDA CHO   PCP: [facility]   Recommendations at discharge:   Follow-up with primary care physician within 3 days of discharge  Discharge Diagnoses: Principal Problem:   Acute pulmonary embolism (HCC) Active Problems:   DVT (deep venous thrombosis) (HCC)   Chronic systolic heart failure (HCC)   Essential (primary) hypertension   Stroke (HCC)   Hypomagnesemia   Elevated lactic acid level   Myocardial injury   Prostate cancer (HCC)   Acute metabolic encephalopathy  Resolved Problems:   * No resolved hospital problems. *  Hospital Course:  Matthew Mcgrath is a 78 y.o. 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    Assessment and Plan:   Acute pulmonary embolism (HCC) and right femoral DVT (deep venous thrombosis) (HCC):  Continue Eliquis .  Initially treated with IV heparin  drip. No surgical intervention required per vascular surgeon. 2D echo is pending at the time of discharge     Acute hypoxemic respiratory failure Improved.  He is tolerating room air.     Chronic systolic heart failure (HCC):  2D echo on 11/27/2022 showed EF of 40-45%.   Continue spironolactone      Hypomagnesemia Improved     Elevated lactic acid level: Lactic acid of 4.6, no signs for infection.  No fever or leukocytosis, may be due to hypoxia. -Patient received 500 cc normal saline in the ED     Myocardial injury: Troponin 39- > 49, likely due to demand ischemia secondary PE     Type 2 diabetes mellitus with hyperglycemia Continue  metformin  at discharge Monitor glucose levels closely and adjust hypoglycemic agents as needed. Hemoglobin A1c was 8.8     Prostate cancer [facility]) admitted with sepsis due to bones (ribs, thoracic, lumbar spine and pelvis) Continue Flomax , Lynparza  He was on leuprolide prior to admission.     Acute metabolic encephalopathy: Mental status improved to baseline. He has underlying cognitive impairment. CT head negative for acute intracranial abnormalities.       Chronic debility Glenda, sister, stated that patient cannot function at home anymore.  He said he can no longer feed himself.  He also said that he has been wheelchair-bound for about a year.   PT and OT recommended discharge to SNF.     Comorbidities include history of stroke, hypertension Aspirin  has been discontinued.   His condition has improved and he is deemed stable for discharge to SNF today.  Discharge plan was discussed with Dr. Eleanore, PCP, over the phone.   Discharge plan was discussed with Gaetana, sister, over the phone.  Consultants: Vascular surgeon Procedures performed: None Disposition: Skilled nursing facility Diet recommendation:  Cardiac and Carb modified diet DISCHARGE MEDICATION: Allergies as of 04/11/2023   No Known Allergies      Medication List     STOP taking these medications    Aspirin  Low Dose 81 MG tablet Generic drug: aspirin  EC       TAKE these medications    acetaminophen  325 MG tablet Commonly known as: TYLENOL  Take 2 tablets (650 mg total) by mouth every 4 (four) hours as needed for mild pain (or temp >  37.5 C (99.5 F)). What changed: Another medication with the same name was removed. Continue taking this medication, and follow the directions you see here.   apixaban  5 MG Tabs tablet Commonly known as: ELIQUIS  Take 10 mg twice daily for 5 days followed by 5 mg twice daily.  Continue Eliquis  until further notice from your primary care physician or oncologist.   carvedilol   3.125 MG tablet Commonly known as: COREG  Take 3.125 mg by mouth 2 (two) times daily.   cholecalciferol  25 MCG (1000 UNIT) tablet Commonly known as: VITAMIN D3 Take 1,000 Units by mouth daily.   metFORMIN  500 MG 24 hr tablet Commonly known as: GLUCOPHAGE -XR Take 500 mg by mouth daily with breakfast.   Glumetza  1000 MG 24 hr tablet Generic drug: metFORMIN  Take 1,000 mg by mouth daily.   leuprolide acetate (6 Month) 45 MG injection Generic drug: leuprolide (6 Month) Inject into the skin.   Lynparza  150 MG tablet Generic drug: olaparib  Take 150 mg by mouth 2 (two) times daily.   Lynparza  100 MG tablet Generic drug: olaparib  Take 100 mg by mouth 2 (two) times daily.   magnesium  oxide 400 (240 Mg) MG tablet Commonly known as: MAG-OX Take 1 tablet by mouth daily.   omeprazole  10 MG capsule Commonly known as: PRILOSEC Take 10 mg by mouth daily.   One-Daily Multi-Vitamin Tabs Take 1 tablet by mouth daily.   senna 8.6 MG Tabs tablet Commonly known as: SENOKOT Take 2 tablets by mouth daily.   spironolactone  25 MG tablet Commonly known as: ALDACTONE  Take 0.5 tablets (12.5 mg total) by mouth daily.   tamsulosin  0.4 MG Caps capsule Commonly known as: FLOMAX  Take 1 capsule (0.4 mg total) by mouth daily.        Contact information for follow-up providers     [facility] Follow up.   Why: Hospital follow up Contact information: [address] [facility] [place] [address] [phone]              Contact information for after-discharge care     Destination     [facility] .   Service: Skilled Nursing Contact information: [address] [place]  [address] [phone]                    Discharge Exam: Fredricka Weights   04/07/23 1555 04/09/23 0743  Weight: 85 kg 85 kg   GEN: NAD SKIN: Warm and dry EYES: No pallor or icterus ENT: MMM CV: RRR PULM: CTA B ABD: soft, ND, NT, +BS CNS: AAO x  1 (person), non focal EXT: No edema or tenderness   Condition at discharge: good  The results of significant diagnostics from this hospitalization (including imaging, microbiology, ancillary and laboratory) are listed below for reference.   Imaging Studies: US  Venous Img Lower Bilateral (DVT) Result Date: 04/07/2023 CLINICAL DATA:  Acute pulmonary embolus EXAM: BILATERAL LOWER EXTREMITY VENOUS DOPPLER ULTRASOUND TECHNIQUE: Gray-scale sonography with compression, as well as color and duplex ultrasound, were performed to evaluate the deep venous system(s) from the level of the common femoral vein through the popliteal and proximal calf veins. COMPARISON:  None Available. FINDINGS: VENOUS Within the left lower extremity, there is normal compressibility of the common femoral, superficial femoral, and popliteal veins, as well as the visualized calf veins. Visualized portions of profunda femoral vein and great saphenous vein unremarkable. No filling defects to suggest DVT on grayscale or color Doppler imaging. Doppler waveforms show normal  direction of venous flow, normal respiratory plasticity and response to augmentation. Within the right lower extremity, the common femoral vein is widely patent with appropriate antegrade flow, compressibility, and respiratory variation. Visualized profundus femoral vein and greater saphenous vein and saphenofemoral junction patent. The femoral vein demonstrates focal, anechoic, occlusive thrombus within the mid segment and nonocclusive DVT within the distal segment. There is lack of appropriate augmentation within the more proximal femoral vein in keeping with intervening occlusion. The popliteal vein and infrapopliteal venous vasculature appears widely patent. OTHER None. Limitations: none IMPRESSION: 1. Occlusive DVT within the mid segment of the right femoral vein and nonocclusive DVT within the distal segment. 2. No evidence of left lower extremity DVT. Electronically  Signed   By: Dorethia Molt M.D.   On: 04/07/2023 22:15   CT Angio Chest PE W and/or Wo Contrast Result Date: 04/07/2023 CLINICAL DATA:  Pulmonary embolus suspected with high probability. EXAM: CT ANGIOGRAPHY CHEST WITH CONTRAST TECHNIQUE: Multidetector CT imaging of the chest was performed using the standard protocol during bolus administration of intravenous contrast. Multiplanar CT image reconstructions and MIPs were obtained to evaluate the vascular anatomy. RADIATION DOSE REDUCTION: This exam was performed according to the departmental dose-optimization program which includes automated exposure control, adjustment of the mA and/or kV according to patient size and/or use of iterative reconstruction technique. CONTRAST:  75mL OMNIPAQUE  IOHEXOL  350 MG/ML SOLN COMPARISON:  PET-CT 03/22/2023.  CT chest 12/07/2021 FINDINGS: Cardiovascular: Technically adequate study with good opacification of the central and segmental pulmonary arteries. Filling defects are demonstrated in bilateral main pulmonary arteries, extending into upper, middle, and lower lobe branches with moderate clot burden. This is consistent with acute pulmonary embolus. RV to LV ratio is 0.9, borderline for possible right heart strain. Normal caliber thoracic aorta. Calcification of the aorta and coronary arteries. Mediastinum/Nodes: Esophagus is decompressed. No significant lymphadenopathy. Thyroid gland is unremarkable. Cardiac pacemaker. Lungs/Pleura: Emphysematous changes in the lungs. Atelectasis in the lung bases. No pleural effusion or pneumothorax. Upper Abdomen: No acute abnormalities. Musculoskeletal: Multiple sclerotic lesions demonstrated in the spine, sternum, and ribs consistent with known metastatic disease. Review of the MIP images confirms the above findings. IMPRESSION: 1. Positive examination for multiple bilateral pulmonary emboli. RV to LV ratio is 0.9, borderline for right heart strain. Clinical correlation recommended. 2.  Known skeletal metastases are demonstrated. 3. Emphysematous changes in the lungs. 4. Aortic atherosclerosis Critical Value/emergent results were called by telephone at the time of interpretation on 04/07/2023 at 6:30 pm to provider [facility] , who verbally acknowledged these results. Electronically Signed   By: Elsie Gravely M.D.   On: 04/07/2023 18:44   CT HEAD WO CONTRAST ( ) Result Date: 04/07/2023 CLINICAL DATA:  New onset headache EXAM: CT HEAD WITHOUT CONTRAST TECHNIQUE: Contiguous axial images were obtained from the base of the skull through the vertex without intravenous contrast. RADIATION DOSE REDUCTION: This exam was performed according to the departmental dose-optimization program which includes automated exposure control, adjustment of the mA and/or kV according to patient size and/or use of iterative reconstruction technique. COMPARISON:  01/16/2023 FINDINGS: Brain: Stable chronic small-vessel ischemic changes throughout the white matter. No evidence of acute infarct or hemorrhage. Lateral ventricles and midline structures are stable. No acute extra-axial fluid collections. No mass effect. Stable cerebral atrophy. Vascular: No hyperdense vessel or unexpected calcification. Skull: Normal. Negative for fracture or focal lesion. Sinuses/Orbits: No acute finding. Other: None. IMPRESSION: 1. Stable head CT, no acute process. Electronically Signed   By: Ozell Daring  M.D.   On: 04/07/2023 18:28   DG Chest Port 1 View Result Date: 04/07/2023 CLINICAL DATA:  Question of sepsis. New onset shortness of breath. Rigors. Altered mental status. EXAM: PORTABLE CHEST 1 VIEW COMPARISON:  11/26/2022 FINDINGS: Cardiac pacemaker. Shallow inspiration. Atelectasis or infiltration in the lung bases. Mild cardiac enlargement. No vascular congestion or edema. Calcification of the aorta. Mediastinal contours appear intact. IMPRESSION: Cardiac enlargement. Shallow inspiration with infiltration or atelectasis in the  lung bases. Electronically Signed   By: Elsie Gravely M.D.   On: 04/07/2023 17:07   NM PET (PSMA) SKULL TO MID THIGH Result Date: 03/22/2023 CLINICAL DATA:  Prostate carcinoma with biochemical recurrence. Castrate resistant prostate carcinoma. Bone metastasis. Increasing PSA. EXAM: NUCLEAR MEDICINE PET SKULL BASE TO THIGH TECHNIQUE: 8.34 mCi Flotufolastat (Posluma ) was injected intravenously. Full-ring PET imaging was performed from the skull base to thigh after the radiotracer. CT data was obtained and used for attenuation correction and anatomic localization. COMPARISON:  PSA PET scan 03/31/2022 FINDINGS: NECK No radiotracer activity in neck lymph nodes. Incidental CT finding: None. CHEST No radiotracer accumulation within mediastinal or hilar lymph nodes. No suspicious pulmonary nodules on the CT scan. Incidental CT finding: None. ABDOMEN/PELVIS Prostate: No focal activity in the prostate bed. Lymph nodes: No abnormal radiotracer accumulation within pelvic or abdominal nodes. Liver: No evidence of liver metastasis. Incidental CT finding: Atherosclerotic calcification of the aorta. Bladder is thick-walled. SKELETON Multiple new radiotracer avid lesions within the bilateral ribs and consistent with new skeletal metastasis. Intense lesions in the RIGHT second third and fifth ribs (image 56 example). New lesions in the LEFT second rib. New lesion in the T5 vertebral body. New lesion in the L4 vertebral body. New lesions in the LEFT and RIGHT iliac bones. New lesions are intense with the RIGHT 5 th rib lesion with SUV max equal 21.9. RIGHT iliac lesion with SUV max equal 25. The previous described lesions in the sternum and thoracic spine have improved with minimal radiotracer activity. There is interval sclerosis at the sites. IMPRESSION: 1. Multiple new intense radiotracer avid skeletal metastasis involving the bilateral ribs, thoracic spine, lumbar spine and pelvis. 2. Improved radiotracer activity at the  sternum and thoracic spine metastasis. 3. No evidence of visceral metastasis or nodal metastasis. 4. No evidence of local prostate carcinoma recurrence in the prostate bed. 5.  Aortic Atherosclerosis (ICD10-I70.0). Electronically Signed   By: Jackquline Boxer M.D.   On: 03/22/2023 14:54    Microbiology: Results for orders placed or performed during the hospital encounter of 04/07/23  Blood Culture (routine x 2)     Status: None (Preliminary result)   Collection Time: 04/07/23  4:07 PM   Specimen: BLOOD  Result Value Ref Range Status   Specimen Description BLOOD BLOOD RIGHT ARM  Final   Special Requests   Final    BOTTLES DRAWN AEROBIC AND ANAEROBIC Blood Culture results may not be optimal due to an inadequate volume of blood received in culture bottles   Culture   Final    NO GROWTH 4 DAYS Performed at [facility], [address] [place], [place] [address]    Report Status PENDING  Incomplete  Blood Culture (routine x 2)     Status: None (Preliminary result)   Collection Time: 04/07/23  4:07 PM   Specimen: BLOOD  Result Value Ref Range Status   Specimen Description BLOOD BLOOD RIGHT ARM  Final   Special Requests   Final    BOTTLES DRAWN AEROBIC AND  ANAEROBIC Blood Culture results may not be optimal due to an inadequate volume of blood received in culture bottles   Culture   Final    NO GROWTH 4 DAYS Performed at [facility], [address] [place], [place] [address]    Report Status PENDING  Incomplete  Resp panel by RT-PCR (RSV, Flu A&B, Covid) Anterior Nasal Swab     Status: None   Collection Time: 04/07/23  4:16 PM   Specimen: Anterior Nasal Swab  Result Value Ref Range Status   SARS Coronavirus 2 by RT PCR NEGATIVE NEGATIVE Final    Comment: (NOTE) SARS-CoV-2 target nucleic acids are NOT DETECTED.  The SARS-CoV-2 RNA is generally detectable in upper respiratory specimens during the acute phase of infection. The lowest concentration of SARS-CoV-2  viral copies this assay can detect is 138 copies/mL. A negative result does not preclude SARS-Cov-2 infection and should not be used as the sole basis for treatment or other patient management decisions. A negative result may occur with  improper specimen collection/handling, submission of specimen other than nasopharyngeal swab, presence of viral mutation(s) within the areas targeted by this assay, and inadequate number of viral copies(<138 copies/mL). A negative result must be combined with clinical observations, patient history, and epidemiological information. The expected result is Negative.  Fact Sheet for Patients:  bloggercourse.com  Fact Sheet for Healthcare Providers:  seriousbroker.it  This test is no t yet approved or cleared by the [place]  FDA and  has been authorized for detection and/or diagnosis of SARS-CoV-2 by FDA under an Emergency Use Authorization (EUA). This EUA will remain  in effect (meaning this test can be used) for the duration of the COVID-19 declaration under Section 564(b)(1) of the Act, 21 U.S.C.section 360bbb-3(b)(1), unless the authorization is terminated  or revoked sooner.       Influenza A by PCR NEGATIVE NEGATIVE Final   Influenza B by PCR NEGATIVE NEGATIVE Final    Comment: (NOTE) The Xpert Xpress SARS-CoV-2/FLU/RSV plus assay is intended as an aid in the diagnosis of influenza from Nasopharyngeal swab specimens and should not be used as a sole basis for treatment. Nasal washings and aspirates are unacceptable for Xpert Xpress SARS-CoV-2/FLU/RSV testing.  Fact Sheet for Patients: bloggercourse.com  Fact Sheet for Healthcare Providers: seriousbroker.it  This test is not yet approved or cleared by the [place]  FDA and has been authorized for detection and/or diagnosis of SARS-CoV-2 by FDA under an Emergency Use Authorization  (EUA). This EUA will remain in effect (meaning this test can be used) for the duration of the COVID-19 declaration under Section 564(b)(1) of the Act, 21 U.S.C. section 360bbb-3(b)(1), unless the authorization is terminated or revoked.     Resp Syncytial Virus by PCR NEGATIVE NEGATIVE Final    Comment: (NOTE) Fact Sheet for Patients: bloggercourse.com  Fact Sheet for Healthcare Providers: seriousbroker.it  This test is not yet approved or cleared by the [place]  FDA and has been authorized for detection and/or diagnosis of SARS-CoV-2 by FDA under an Emergency Use Authorization (EUA). This EUA will remain in effect (meaning this test can be used) for the duration of the COVID-19 declaration under Section 564(b)(1) of the Act, 21 U.S.C. section 360bbb-3(b)(1), unless the authorization is terminated or revoked.  Performed at [facility], [address] [place], [place] [address]     Labs: CBC: Recent Labs  Lab 04/07/23 1607 04/08/23 0414 04/09/23 0519  WBC 5.3 4.0 3.9*  NEUTROABS 4.8  --   --  HGB 10.3* 9.7* 9.6*  HCT 32.8* 29.7* 30.0*  MCV 100.3* 99.3 100.0  PLT 180 198 168   Basic Metabolic Panel: Recent Labs  Lab 04/07/23 1607 04/08/23 0414 04/09/23 0519  NA 139 140 140  K 3.8 4.2 3.5  CL 101 103 105  CO2 20* 25 23  GLUCOSE 345* 326* 286*  BUN 28* 26* 21  CREATININE 1.16 1.09 1.00  CALCIUM 9.1 8.7* 8.6*  MG 1.6* 2.1  --   PHOS 3.7  --   --    Liver Function Tests: Recent Labs  Lab 04/07/23 1607  AST 21  ALT 10  ALKPHOS 101  BILITOT 0.7  PROT 7.1  ALBUMIN 3.5   CBG: Recent Labs  Lab 04/10/23 1143 04/10/23 1540 04/10/23 2119 04/11/23 0805 04/11/23 1205  GLUCAP 299* 271* 193* 231* 419*    Discharge time spent: greater than 30 minutes.  Signed: AIDA CHO, MD [facility] 04/11/2023

## 2023-04-11 NOTE — TOC Progression Note (Signed)
 Transition of Care [facility]) - Progression Note    Patient Details  Name: GRAISON LEINBERGER MRN: [id number] Date of Birth: [birth year]  [facility]) CM/SW Contact  Ladene Lady, LCSW Phone Number: 04/11/2023, 8:39 AM  Clinical Narrative:   CSW spoke with sister who states that she wants WOM. [facility] is able to accept today.     Expected Discharge Plan: Skilled Nursing Facility Barriers to Discharge: Continued Medical Work up  Expected Discharge Plan and Services                                               Social Determinants of Health (SDOH) Interventions SDOH Screenings   Food Insecurity: No Food Insecurity (04/09/2023)  Housing: Unknown (04/09/2023)  Transportation Needs: No Transportation Needs (04/09/2023)  Utilities: Not At Risk (04/09/2023)  Social Connections: Unknown (04/09/2023)  Tobacco Use: Medium Risk (04/07/2023)    Readmission Risk Interventions    06/21/2021   11:22 AM  Readmission Risk Prevention Plan  Transportation Screening Complete  PCP or Specialist Appt within 5-7 Days Complete  Home Care Screening Complete  Medication Review (RN CM) Complete

## 2023-04-12 LAB — ECHOCARDIOGRAM COMPLETE
AR max vel: 3.06 cm2
AV Area VTI: 2.91 cm2
AV Area mean vel: 3.19 cm2
AV Mean grad: 3 mm[Hg]
AV Peak grad: 7.2 mm[Hg]
Ao pk vel: 1.34 m/s
Area-P 1/2: 5.62 cm2
Calc EF: 48.7 %
Height: 65 in
MV VTI: 2.44 cm2
S' Lateral: 3.4 cm
Single Plane A2C EF: 41.5 %
Single Plane A4C EF: 54.7 %
Weight: 2998.26 [oz_av]

## 2023-04-12 LAB — CULTURE, BLOOD (ROUTINE X 2)
Culture: NO GROWTH
Culture: NO GROWTH

## 2023-04-19 ENCOUNTER — Telehealth: Payer: Self-pay | Admitting: Oncology

## 2023-04-19 NOTE — Telephone Encounter (Signed)
Per secure chat from MD, "schedule pt 2 months from his last visit lab MD cbc cmp PSA"   I called pt to schedule appts, pt answered the phone and I was talking with him and then he hung up. I have scheduled the appts in march and mailing AVS.

## 2023-05-02 ENCOUNTER — Other Ambulatory Visit: Payer: Self-pay | Admitting: Family Medicine

## 2023-05-02 DIAGNOSIS — L89626 Pressure-induced deep tissue damage of left heel: Secondary | ICD-10-CM

## 2023-05-02 DIAGNOSIS — L89610 Pressure ulcer of right heel, unstageable: Secondary | ICD-10-CM

## 2023-05-15 ENCOUNTER — Ambulatory Visit
Admission: RE | Admit: 2023-05-15 | Discharge: 2023-05-15 | Disposition: A | Discharge status: Home / Self Care | Payer: Medicare (Managed Care) | Source: Ambulatory Visit | Attending: Family Medicine | Admitting: Family Medicine

## 2023-05-15 DIAGNOSIS — L89610 Pressure ulcer of right heel, unstageable: Secondary | ICD-10-CM | POA: Diagnosis present

## 2023-05-15 DIAGNOSIS — L89626 Pressure-induced deep tissue damage of left heel: Secondary | ICD-10-CM | POA: Insufficient documentation

## 2023-05-26 ENCOUNTER — Encounter (INDEPENDENT_AMBULATORY_CARE_PROVIDER_SITE_OTHER): Payer: Medicare (Managed Care)

## 2023-05-26 ENCOUNTER — Encounter (INDEPENDENT_AMBULATORY_CARE_PROVIDER_SITE_OTHER): Payer: Self-pay | Admitting: Vascular Surgery

## 2023-05-26 ENCOUNTER — Ambulatory Visit (INDEPENDENT_AMBULATORY_CARE_PROVIDER_SITE_OTHER): Payer: Medicare (Managed Care) | Admitting: Vascular Surgery

## 2023-05-26 VITALS — BP 103/64 | HR 97 | Resp 18 | Wt 187.0 lb

## 2023-05-26 DIAGNOSIS — C61 Malignant neoplasm of prostate: Secondary | ICD-10-CM

## 2023-05-26 DIAGNOSIS — I7025 Atherosclerosis of native arteries of other extremities with ulceration: Secondary | ICD-10-CM | POA: Diagnosis not present

## 2023-05-26 DIAGNOSIS — E785 Hyperlipidemia, unspecified: Secondary | ICD-10-CM

## 2023-05-26 DIAGNOSIS — E119 Type 2 diabetes mellitus without complications: Secondary | ICD-10-CM | POA: Diagnosis not present

## 2023-05-26 DIAGNOSIS — I10 Essential (primary) hypertension: Secondary | ICD-10-CM | POA: Diagnosis not present

## 2023-05-26 DIAGNOSIS — I2699 Other pulmonary embolism without acute cor pulmonale: Secondary | ICD-10-CM

## 2023-05-26 DIAGNOSIS — I723 Aneurysm of iliac artery: Secondary | ICD-10-CM | POA: Diagnosis not present

## 2023-05-26 NOTE — Assessment & Plan Note (Signed)
 Likely the cause of his recent PE and DVT as well as possible increasing his risk of thrombosis of the aneurysm.

## 2023-05-26 NOTE — Assessment & Plan Note (Signed)
 He had duplex studies of the arteries performed at the hospital recently.  This showed monophasic wave flow in the right iliac and femoral system worrisome for an iliac artery occlusion.  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  This was not thrombosed at that time.  There were also bilateral SFA occlusions by duplex.  This could be thrombosed or this could just be a poor reading by our hospital ultrasound which is certainly possible.

## 2023-05-26 NOTE — Assessment & Plan Note (Signed)
 blood pressure control important in reducing the progression of atherosclerotic disease. On appropriate oral medications.

## 2023-05-26 NOTE — Assessment & Plan Note (Signed)
On anticoagulation 

## 2023-05-26 NOTE — Assessment & Plan Note (Signed)
 blood glucose control important in reducing the progression of atherosclerotic disease. Also, involved in wound healing. On appropriate medications.

## 2023-05-26 NOTE — Assessment & Plan Note (Signed)
 He had duplex studies of the arteries performed at the hospital recently.  This showed monophasic wave flow in the right iliac and femoral system worrisome for an iliac artery occlusion.  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  This was not thrombosed at that time.  There were also bilateral SFA occlusions by duplex.  I think there is a fair chance that the iliac artery is not thrombosed and that the monophasic flow is just indicative of the aneurysmal flow and our hospital could not differentiate between stenosis and aneurysm.  Nonetheless, there is an SFA occlusion with wounds on that right side and is also possible that he has thrombosed the aneurysm.  If the aneurysm is thrombosed, this is going to be a very difficult revascularization and the risk of limb loss will be excessive.  The risk of limb loss is high in both lower extremities just from the arterial insufficiency and the extensive ulcerations.  This is clearly a critical and limb threatening situation.  I would recommend a right lower extremity angiogram near future at his convenience likely followed by a left lower extremity angiogram subsequently

## 2023-05-26 NOTE — Progress Notes (Signed)
 Patient ID: Matthew Mcgrath, male   DOB: 11-14-1945, 78 y.o.   MRN: [id number]  Chief Complaint  Patient presents with   New Patient (Initial Visit)   Follow-up    Consult  ulcer of foot. Bil SFA occlusion by outside [place]. ref : mouw,mary  see jd. ok to see today per JD    HPI Matthew Mcgrath is a 78 y.o. male.  I am asked to see the patient by [facility] for evaluation of nonhealing ulcerations of both feet.  These are much worse on the right than the left.  He has an ulceration on the lateral aspect of the fifth metatarsal head as well as on the lateral ankle and on the heel on the right.  On the left, there is a dry eschar that is a heel ulcer.  The patient does not seem to complain of much pain.  He is quite debilitated and these appear to be largely pressure ulcers.  He is now in [employer].  His wife provides much of the history although he does answer some.  He has metastatic cancer and had a pulmonary embolus about 6 weeks ago as well as bilateral lower extremity DVT.  He had duplex studies of the arteries performed at the hospital recently.  This showed monophasic wave flow in the right iliac and femoral system worrisome for an iliac artery occlusion.  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  This was not thrombosed at that time.  There were also bilateral SFA occlusions by duplex.     Past Medical History:  Diagnosis Date   Cardiomyopathy (HCC)    Congestive heart failure (HCC)    Diabetes mellitus type 2, controlled (HCC)    Elevated lipids    Family history of breast cancer    Family history of colon cancer    HTN (hypertension)     Past Surgical History:  Procedure Laterality Date   CARDIAC CATHETERIZATION N/A 04/14/2015   Procedure: Left Heart Cath and Coronary Angiography;  Surgeon: Marcina Millard, MD;  Location: [facility] INVASIVE CV LAB;  Service: Cardiovascular;   Laterality: N/A;   CATARACT EXTRACTION W/ INTRAOCULAR LENS  IMPLANT, BILATERAL Left    COLONOSCOPY WITH PROPOFOL N/A 04/08/2020   Procedure: COLONOSCOPY WITH PROPOFOL;  Surgeon: Toledo, Boykin Nearing, MD;  Location: [facility] ENDOSCOPY;  Service: Gastroenterology;  Laterality: N/A;   CYSTOSCOPY W/ RETROGRADES Bilateral 07/02/2021   Procedure: CYSTOSCOPY WITH RETROGRADE PYELOGRAM;  Surgeon: Sondra Come, MD;  Location: [facility] ORS;  Service: Urology;  Laterality: Bilateral;   HERNIA REPAIR     IMPLANTABLE CARDIOVERTER DEFIBRILLATOR (ICD) GENERATOR CHANGE Left 07/03/2015   Procedure: ICD IMPLANT single chamber;  Surgeon: Sharion Settler, MD;  Location: [facility] ORS;  Service: Cardiovascular;  Laterality: Left;   TRANSURETHRAL RESECTION OF BLADDER TUMOR N/A 07/02/2021   Procedure: TRANSURETHRAL RESECTION OF BLADDER TUMOR (TURBT);  Surgeon: Sondra Come, MD;  Location: [facility] ORS;  Service: Urology;  Laterality: N/A;     Family History  Problem Relation Age of Onset   Heart failure Mother    Heart disease Mother    Alcoholism Father    Hypertension Sister    Diabetes Sister    Hypertension Brother    Diabetes Brother    Colon cancer Brother        vs prostate   Multiple myeloma Brother  Breast cancer Cousin      Social History   Tobacco Use   Smoking status: Former    Current packs/day: 0.00    Average packs/day: 0.3 packs/day for 30.0 years (7.5 ttl pk-yrs)    Types: Cigarettes    Start date: 02/10/1990    Quit date: 02/11/2020    Years since quitting: 3.2    Passive exposure: Past   Smokeless tobacco: Never  Vaping Use   Vaping status: Never Used  Substance Use Topics   Alcohol use: No   Drug use: Not Currently    Frequency: 2.0 times per week    Types: Marijuana    Comment: past use     No Known Allergies  Current Outpatient Medications  Medication Sig Dispense Refill   acetaminophen (TYLENOL) 325 MG tablet Take 2 tablets (650 mg total) by mouth every 4 (four) hours as needed  for mild pain (or temp > 37.5 C (99.5 F)).     apixaban (ELIQUIS) 5 MG TABS tablet Take 10 mg twice daily for 5 days followed by 5 mg twice daily.  Continue Eliquis until further notice from your primary care physician or oncologist.     carvedilol (COREG) 3.125 MG tablet Take 3.125 mg by mouth 2 (two) times daily.     cholecalciferol (VITAMIN D3) 25 MCG (1000 UNIT) tablet Take 1,000 Units by mouth daily.     GLUMETZA 1000 MG 24 hr tablet Take 1,000 mg by mouth daily.     LEUPROLIDE ACETATE, 6 MONTH, 45 MG injection Inject into the skin.     LYNPARZA 100 MG tablet Take 100 mg by mouth 2 (two) times daily.     LYNPARZA 150 MG tablet Take 150 mg by mouth 2 (two) times daily.     magnesium oxide (MAG-OX) 400 (240 Mg) MG tablet Take 1 tablet by mouth daily.     metFORMIN (GLUCOPHAGE-XR) 500 MG 24 hr tablet Take 500 mg by mouth daily with breakfast.     Multiple Vitamin (ONE-DAILY MULTI-VITAMIN) TABS Take 1 tablet by mouth daily.     omeprazole (PRILOSEC) 10 MG capsule Take 10 mg by mouth daily.     senna (SENOKOT) 8.6 MG TABS tablet Take 2 tablets by mouth daily.     spironolactone (ALDACTONE) 25 MG tablet Take 0.5 tablets (12.5 mg total) by mouth daily.     tamsulosin (FLOMAX) 0.4 MG CAPS capsule Take 1 capsule (0.4 mg total) by mouth daily. 30 capsule 2   No current facility-administered medications for this visit.      REVIEW OF SYSTEMS (Negative unless checked)  Constitutional: [] Weight loss  [] Fever  [] Chills Cardiac: [] Chest pain   [] Chest pressure   [] Palpitations   [] Shortness of breath when laying flat   [] Shortness of breath at rest   [x] Shortness of breath with exertion. Vascular:  [] Pain in legs with walking   [] Pain in legs at rest   [] Pain in legs when laying flat   [] Claudication   [] Pain in feet when walking  [] Pain in feet at rest  [x] Pain in feet when laying flat   [] History of DVT   [] Phlebitis   [] Swelling in legs   [] Varicose veins   [x] Non-healing ulcers Pulmonary:    [] Uses home oxygen   [] Productive cough   [] Hemoptysis   [] Wheeze  [] COPD   [] Asthma Neurologic:  [] Dizziness  [] Blackouts   [] Seizures   [] History of stroke   [] History of TIA  [] Aphasia   [] Temporary blindness   [] Dysphagia   []   Weakness or numbness in arms   [] Weakness or numbness in legs Musculoskeletal:  [x] Arthritis   [] Joint swelling   [] Joint pain   [] Low back pain Hematologic:  [] Easy bruising  [] Easy bleeding   [] Hypercoagulable state   [] Anemic  [] Hepatitis Gastrointestinal:  [] Blood in stool   [] Vomiting blood  [] Gastroesophageal reflux/heartburn   [] Abdominal pain Genitourinary:  [x] Chronic kidney disease   [] Difficult urination  [] Frequent urination  [] Burning with urination   [] Hematuria Skin:  [] Rashes   [x] Ulcers   [x] Wounds Psychological:  [] History of anxiety   []  History of major depression.    Physical Exam BP 103/64   Pulse 97   Resp 18   Wt 187 lb (84.8 kg)   BMI 31.12 kg/m  Gen:  WD/WN, NAD Head: [place]/AT, No temporalis wasting.  Ear/Nose/Throat: Hearing grossly intact, nares w/o erythema or drainage, oropharynx w/o Erythema/Exudate Eyes: Conjunctiva clear, sclera non-icteric  Neck: trachea midline.  No JVD.  Pulmonary:  Good air movement, respirations not labored, no use of accessory muscles  Cardiac: RRR, no JVD Vascular:  Vessel Right Left  Radial Palpable Palpable                          PT NP NP  DP NP NP   Gastrointestinal:. No masses, surgical incisions, or scars. Musculoskeletal: M/S 5/5 throughout.  Extremities without ischemic changes.  No deformity or atrophy. Multiple ulcers that are dry and clean as described above. 1+ BLE edema. Neurologic: Sensation grossly intact in extremities.  Symmetrical.  Speech is fluent. Motor exam as listed above. Psychiatric: Judgment intact, Mood & affect appropriate for pt's clinical situation. Dermatologic: Right fifth metatarsal lateral dry eschar.  Superficial wound on the right lateral ankle.  Fairly  extensive dry eschar over 8 to 10 cm on both heels.    Radiology US ARTERIAL LOWER EXTREMITY DUPLEX BILATERAL Result Date: 05/16/2023 CLINICAL DATA:  78 year old male with lower extremity heel wounds EXAM: NONINVASIVE PHYSIOLOGIC VASCULAR STUDY OF BILATERAL LOWER EXTREMITIES TECHNIQUE: Evaluation of both lower extremities was performed at rest, including calculation of ankle-brachial indices, directed duplex COMPARISON:  None Available. FINDINGS: Right ABI:  Not acquired Left ABI:  Not acquired Right Lower Extremity: Directed duplex right lower extremity demonstrates monophasic common femoral artery, profunda femoris, proximal superficial femoral artery. Evidence of occlusion in the mid segment of the SFA with monophasic signal distal. Monophasic popliteal artery, posterior tibial artery, anterior tibial artery. Left Lower Extremity: Directed duplex left lower extremity demonstrates triphasic common femoral artery, profunda femoris. Monophasic proximal SFA, mid SFA, distal SFA. Monophasic popliteal artery, posterior tibial artery and anterior tibial artery IMPRESSION: Right: The directed duplex demonstrates occlusive iliac arterial disease, with monophasic common femoral artery waveform. Occlusion in the mid segment of the SFA, with abnormal monophasic popliteal artery and tibial arteries. Left: Directed duplex demonstrates mild iliac arterial disease, with proximal SFA occlusion, abnormal monophasic popliteal artery and tibial arteries. Signed, Yvone Neu. Miachel Roux, RPVI [facility] Radiology Electronically Signed   By: Gilmer Mor D.O.   On: 05/16/2023 09:42    Labs Recent Results (from the past 2160 hours)  PSA     Status: Abnormal   Collection Time: 03/13/23  9:36 AM  Result Value Ref Range   Prostatic Specific Antigen 4.54 (H) 0.00 - 4.00 ng/mL    Comment: (NOTE) While PSA levels of <=4.00 ng/ml are reported as reference range, some men  with levels below 4.00 ng/ml can  have prostate cancer and many men with PSA above 4.00 ng/ml do not have prostate cancer.  Other tests such as free PSA, age specific reference ranges, PSA velocity and PSA doubling time may be helpful especially in men less than 37 years old. Performed at [facility], [address] [place], [place] [address]   CMP (Cancer Center only)     Status: Abnormal   Collection Time: 03/13/23  9:36 AM  Result Value Ref Range   Sodium 138 135 - 145 mmol/L   Potassium 4.0 3.5 - 5.1 mmol/L   Chloride 104 98 - 111 mmol/L   CO2 24 22 - 32 mmol/L   Glucose, Bld 265 (H) 70 - 99 mg/dL    Comment: Glucose reference range applies only to samples taken after fasting for at least 8 hours.   BUN 24 (H) 8 - 23 mg/dL   Creatinine 6.04 5.40 - 1.24 mg/dL   Calcium 9.3 8.9 - 98.1 mg/dL   Total Protein 6.8 6.5 - 8.1 g/dL   Albumin 3.6 3.5 - 5.0 g/dL   AST 16 15 - 41 U/L   ALT 12 0 - 44 U/L   Alkaline Phosphatase 71 38 - 126 U/L   Total Bilirubin 0.6 0.0 - 1.2 mg/dL   GFR, Estimated >19 >14 mL/min    Comment: (NOTE) Calculated using the CKD-EPI Creatinine Equation (2021)    Anion gap 10 5 - 15    Comment: Performed at [facility], [address] [place], [place] [address]  CBC with Differential (Cancer Center Only)     Status: Abnormal   Collection Time: 03/13/23  9:37 AM  Result Value Ref Range   WBC Count 3.7 (L) 4.0 - 10.5 K/uL   RBC 3.22 (L) 4.22 - 5.81 MIL/uL   Hemoglobin 10.4 (L) 13.0 - 17.0 g/dL   HCT 62.1 (L) 30.8 - 65.7 %   MCV 100.9 (H) 80.0 - 100.0 fL   MCH 32.3 26.0 - 34.0 pg   MCHC 32.0 30.0 - 36.0 g/dL   RDW 84.6 96.2 - 95.2 %   Platelet Count 205 150 - 400 K/uL   nRBC 0.5 (H) 0.0 - 0.2 %   Neutrophils Relative % 73 %   Neutro Abs 2.7 1.7 - 7.7 K/uL   Lymphocytes Relative 15 %   Lymphs Abs 0.6 (L) 0.7 - 4.0 K/uL   Monocytes Relative 9 %   Monocytes Absolute 0.3 0.1 - 1.0 K/uL   Eosinophils Relative 1 %   Eosinophils  Absolute 0.0 0.0 - 0.5 K/uL   Basophils Relative 1 %   Basophils Absolute 0.0 0.0 - 0.1 K/uL   Immature Granulocytes 1 %   Abs Immature Granulocytes 0.02 0.00 - 0.07 K/uL    Comment: Performed at [facility], [address] [place], [place] [address]  PSA     Status: Abnormal   Collection Time: 03/14/23 11:04 AM  Result Value Ref Range   Prostatic Specific Antigen 4.93 (H) 0.00 - 4.00 ng/mL    Comment: (NOTE) While PSA levels of <=4.00 ng/ml are reported as reference range, some men with levels below 4.00 ng/ml can have prostate cancer and many men with PSA above 4.00 ng/ml do not have prostate cancer.  Other tests such as free PSA, age specific reference ranges, PSA velocity and PSA doubling time may be helpful especially in men less than 36 years old. Performed at [facility], [address] [place], [place] [address]  Testosterone     Status: Abnormal   Collection Time: 03/14/23 11:05 AM  Result Value Ref Range   Testosterone 23 (L) 264 - 916 ng/dL    Comment: (NOTE) Adult male reference interval is based on a population of healthy nonobese males (BMI <30) between 60 and 54 years old. Travison, et.al. JCEM [phone]. PMID: [id number]. Performed At: [facility] [address] [place], [place] [id number] Jolene Schimke MD UV:[id number]   Lactic acid, plasma     Status: Abnormal   Collection Time: 04/07/23  4:07 PM  Result Value Ref Range   Lactic Acid, Venous 4.6 (HH) 0.5 - 1.9 mmol/L    Comment: CRITICAL RESULT CALLED TO, READ BACK BY AND VERIFIED WITH TRACEY [facility] 04/07/2023 AT 1655 SRR Performed at [facility], [address] [place], [place] [address]   Comprehensive metabolic panel     Status: Abnormal   Collection Time: 04/07/23  4:07 PM  Result Value Ref Range   Sodium 139 135 - 145 mmol/L   Potassium 3.8 3.5 - 5.1 mmol/L   Chloride 101 98 - 111 mmol/L   CO2 20 (L) 22 - 32 mmol/L   Glucose, Bld 345 (H)  70 - 99 mg/dL    Comment: Glucose reference range applies only to samples taken after fasting for at least 8 hours.   BUN 28 (H) 8 - 23 mg/dL   Creatinine, Ser 5.63 0.61 - 1.24 mg/dL   Calcium 9.1 8.9 - 87.5 mg/dL   Total Protein 7.1 6.5 - 8.1 g/dL   Albumin 3.5 3.5 - 5.0 g/dL   AST 21 15 - 41 U/L   ALT 10 0 - 44 U/L   Alkaline Phosphatase 101 38 - 126 U/L   Total Bilirubin 0.7 0.0 - 1.2 mg/dL   GFR, Estimated >64 >33 mL/min    Comment: (NOTE) Calculated using the CKD-EPI Creatinine Equation (2021)    Anion gap 18 (H) 5 - 15    Comment: Performed at [facility], [address] [place], [place] [address]  CBC with Differential     Status: Abnormal   Collection Time: 04/07/23  4:07 PM  Result Value Ref Range   WBC 5.3 4.0 - 10.5 K/uL   RBC 3.27 (L) 4.22 - 5.81 MIL/uL   Hemoglobin 10.3 (L) 13.0 - 17.0 g/dL   HCT 84.1 (L) 66.0 - 63.0 %   MCV 100.3 (H) 80.0 - 100.0 fL   MCH 31.5 26.0 - 34.0 pg   MCHC 31.4 30.0 - 36.0 g/dL   RDW 16.0 10.9 - 32.3 %   Platelets 180 150 - 400 K/uL   nRBC 0.4 (H) 0.0 - 0.2 %   Neutrophils Relative % 90 %   Neutro Abs 4.8 1.7 - 7.7 K/uL   Lymphocytes Relative 5 %   Lymphs Abs 0.3 (L) 0.7 - 4.0 K/uL   Monocytes Relative 4 %   Monocytes Absolute 0.2 0.1 - 1.0 K/uL   Eosinophils Relative 1 %   Eosinophils Absolute 0.0 0.0 - 0.5 K/uL   Basophils Relative 0 %   Basophils Absolute 0.0 0.0 - 0.1 K/uL   Immature Granulocytes 0 %   Abs Immature Granulocytes 0.02 0.00 - 0.07 K/uL    Comment: Performed at [facility], [address] [place], [place] [address]  Protime-INR     Status: None   Collection Time: 04/07/23  4:07 PM  Result Value Ref Range   Prothrombin Time 14.1 11.4 - 15.2 seconds  INR 1.1 0.8 - 1.2    Comment: (NOTE) INR goal varies based on device and disease states. Performed at [facility], [address] [place], [place] [address]   APTT     Status: None   Collection Time: 04/07/23  4:07 PM  Result  Value Ref Range   aPTT 30 24 - 36 seconds    Comment: Performed at [facility], [address] [place], [place] [address]  Blood Culture (routine x 2)     Status: None   Collection Time: 04/07/23  4:07 PM   Specimen: BLOOD  Result Value Ref Range   Specimen Description BLOOD BLOOD RIGHT ARM    Special Requests      BOTTLES DRAWN AEROBIC AND ANAEROBIC Blood Culture results may not be optimal due to an inadequate volume of blood received in culture bottles   Culture      NO GROWTH 5 DAYS Performed at [facility], [address], [place], [place] [address]    Report Status 04/12/2023 FINAL   Blood Culture (routine x 2)     Status: None   Collection Time: 04/07/23  4:07 PM   Specimen: BLOOD  Result Value Ref Range   Specimen Description BLOOD BLOOD RIGHT ARM    Special Requests      BOTTLES DRAWN AEROBIC AND ANAEROBIC Blood Culture results may not be optimal due to an inadequate volume of blood received in culture bottles   Culture      NO GROWTH 5 DAYS Performed at [facility], [address], [place], [place] [address]    Report Status 04/12/2023 FINAL   Brain natriuretic peptide     Status: Abnormal   Collection Time: 04/07/23  4:07 PM  Result Value Ref Range   B Natriuretic Peptide 299.4 (H) 0.0 - 100.0 pg/mL    Comment: Performed at [facility], [address] [place], [place] [address]  Blood gas, venous     Status: Abnormal   Collection Time: 04/07/23  4:07 PM  Result Value Ref Range   pH, Ven 7.47 (H) 7.25 - 7.43   pCO2, Ven 33 (L) 44 - 60 mmHg   pO2, Ven 59 (H) 32 - 45 mmHg   Bicarbonate 24.0 20.0 - 28.0 mmol/L   Acid-Base Excess 0.7 0.0 - 2.0 mmol/L   O2 Saturation 91.8 %   Patient temperature 37.0    Collection site VEIN     Comment: Performed at [facility], [address] [place], [place] [address]  Lipase, blood     Status: None   Collection Time: 04/07/23  4:07 PM  Result Value Ref Range   Lipase 33 11  - 51 U/L    Comment: Performed at [facility], [address] [place], [place] [address]  Troponin I (High Sensitivity)     Status: Abnormal   Collection Time: 04/07/23  4:07 PM  Result Value Ref Range   Troponin I (High Sensitivity) 39 (H) <18 ng/L    Comment: (NOTE) Elevated high sensitivity troponin I (hsTnI) values and significant  changes across serial measurements may suggest ACS but many other  chronic and acute conditions are known to elevate hsTnI results.  Refer to the "Links" section for chest pain algorithms and additional  guidance. Performed at [facility], [address], [place], [place] [address]   Magnesium     Status: Abnormal   Collection Time: 04/07/23  4:07 PM  Result Value Ref Range  Magnesium 1.6 (L) 1.7 - 2.4 mg/dL    Comment: Performed at [facility], [address] [place], [place] [address]  TSH     Status: None   Collection Time: 04/07/23  4:07 PM  Result Value Ref Range   TSH 1.565 0.350 - 4.500 uIU/mL    Comment: Performed by a 3rd Generation assay with a functional sensitivity of <=0.01 uIU/mL. Performed at [facility], [address] [place], [place] [address]   T4, free     Status: Abnormal   Collection Time: 04/07/23  4:07 PM  Result Value Ref Range   Free T4 1.18 (H) 0.61 - 1.12 ng/dL    Comment: (NOTE) Biotin ingestion may interfere with free T4 tests. If the results are inconsistent with the TSH level, previous test results, or the clinical presentation, then consider biotin interference. If needed, order repeat testing after stopping biotin. Performed at [facility], [address] [place], [place] [address]   CK     Status: Abnormal   Collection Time: 04/07/23  4:07 PM  Result Value Ref Range   Total CK 42 (L) 49 - 397 U/L    Comment: Performed at [facility], [address] [place], [place] [address]  Phosphorus     Status: None   Collection Time: 04/07/23   4:07 PM  Result Value Ref Range   Phosphorus 3.7 2.5 - 4.6 mg/dL    Comment: Performed at [facility], [address] [place], [place] [address]  Resp panel by RT-PCR (RSV, Flu A&B, Covid) Anterior Nasal Swab     Status: None   Collection Time: 04/07/23  4:16 PM   Specimen: Anterior Nasal Swab  Result Value Ref Range   SARS Coronavirus 2 by RT PCR NEGATIVE NEGATIVE    Comment: (NOTE) SARS-CoV-2 target nucleic acids are NOT DETECTED.  The SARS-CoV-2 RNA is generally detectable in upper respiratory specimens during the acute phase of infection. The lowest concentration of SARS-CoV-2 viral copies this assay can detect is 138 copies/mL. A negative result does not preclude SARS-Cov-2 infection and should not be used as the sole basis for treatment or other patient management decisions. A negative result may occur with  improper specimen collection/handling, submission of specimen other than nasopharyngeal swab, presence of viral mutation(s) within the areas targeted by this assay, and inadequate number of viral copies(<138 copies/mL). A negative result must be combined with clinical observations, patient history, and epidemiological information. The expected result is Negative.  Fact Sheet for Patients:  BloggerCourse.com  Fact Sheet for Healthcare Providers:  SeriousBroker.it  This test is no t yet approved or cleared by the [place] FDA and  has been authorized for detection and/or diagnosis of SARS-CoV-2 by FDA under an Emergency Use Authorization (EUA). This EUA will remain  in effect (meaning this test can be used) for the duration of the COVID-19 declaration under Section 564(b)(1) of the Act, 21 U.S.C.section 360bbb-3(b)(1), unless the authorization is terminated  or revoked sooner.       Influenza A by PCR NEGATIVE NEGATIVE   Influenza B by PCR NEGATIVE NEGATIVE    Comment: (NOTE) The Xpert Xpress  SARS-CoV-2/FLU/RSV plus assay is intended as an aid in the diagnosis of influenza from Nasopharyngeal swab specimens and should not be used as a sole basis for treatment. Nasal washings and aspirates are unacceptable for Xpert Xpress SARS-CoV-2/FLU/RSV testing.  Fact Sheet for Patients: BloggerCourse.com  Fact Sheet for Healthcare Providers: SeriousBroker.it  This test  is not yet approved or cleared by the [place] and has been authorized for detection and/or diagnosis of SARS-CoV-2 by FDA under an Emergency Use Authorization (EUA). This EUA will remain in effect (meaning this test can be used) for the duration of the COVID-19 declaration under Section 564(b)(1) of the Act, 21 U.S.C. section 360bbb-3(b)(1), unless the authorization is terminated or revoked.     Resp Syncytial Virus by PCR NEGATIVE NEGATIVE    Comment: (NOTE) Fact Sheet for Patients: BloggerCourse.com  Fact Sheet for Healthcare Providers: SeriousBroker.it  This test is not yet approved or cleared by the [place] FDA and has been authorized for detection and/or diagnosis of SARS-CoV-2 by FDA under an Emergency Use Authorization (EUA). This EUA will remain in effect (meaning this test can be used) for the duration of the COVID-19 declaration under Section 564(b)(1) of the Act, 21 U.S.C. section 360bbb-3(b)(1), unless the authorization is terminated or revoked.  Performed at [facility], [address] [place], [place] [address]   Lactic acid, plasma     Status: Abnormal   Collection Time: 04/07/23  6:25 PM  Result Value Ref Range   Lactic Acid, Venous 2.8 (HH) 0.5 - 1.9 mmol/L    Comment: CRITICAL VALUE NOTED. VALUE IS CONSISTENT WITH PREVIOUSLY REPORTED/CALLED VALUE lfd Performed at [facility], [address] [place], [place] [address]   Troponin I (High Sensitivity)      Status: Abnormal   Collection Time: 04/07/23  6:25 PM  Result Value Ref Range   Troponin I (High Sensitivity) 49 (H) <18 ng/L    Comment: (NOTE) Elevated high sensitivity troponin I (hsTnI) values and significant  changes across serial measurements may suggest ACS but many other  chronic and acute conditions are known to elevate hsTnI results.  Refer to the "Links" section for chest pain algorithms and additional  guidance. Performed at [facility], [address] [place], [place] [address]   Beta-hydroxybutyric acid     Status: None   Collection Time: 04/07/23  6:25 PM  Result Value Ref Range   Beta-Hydroxybutyric Acid 0.25 0.05 - 0.27 mmol/L    Comment: Performed at [facility], [address] [place], [place] [address]  CBG monitoring, ED     Status: Abnormal   Collection Time: 04/07/23  9:10 PM  Result Value Ref Range   Glucose-Capillary 306 (H) 70 - 99 mg/dL    Comment: Glucose reference range applies only to samples taken after fasting for at least 8 hours.  Heparin level (unfractionated)     Status: None   Collection Time: 04/08/23  4:14 AM  Result Value Ref Range   Heparin Unfractionated 0.55 0.30 - 0.70 IU/mL    Comment: (NOTE) The clinical reportable range upper limit is being lowered to >1.10 to align with the FDA approved guidance for the current laboratory assay.  If heparin results are below expected values, and patient dosage has  been confirmed, suggest follow up testing of antithrombin III levels. Performed at [facility], [address] [place], [place] [address]   Basic metabolic panel     Status: Abnormal   Collection Time: 04/08/23  4:14 AM  Result Value Ref Range   Sodium 140 135 - 145 mmol/L   Potassium 4.2 3.5 - 5.1 mmol/L    Comment: HEMOLYSIS AT THIS LEVEL MAY AFFECT RESULT   Chloride 103 98 - 111 mmol/L   CO2 25 22 - 32 mmol/L   Glucose, Bld 326 (  H) 70 - 99 mg/dL    Comment: Glucose reference range applies  only to samples taken after fasting for at least 8 hours.   BUN 26 (H) 8 - 23 mg/dL   Creatinine, Ser 2.53 0.61 - 1.24 mg/dL   Calcium 8.7 (L) 8.9 - 10.3 mg/dL   GFR, Estimated >66 >44 mL/min    Comment: (NOTE) Calculated using the CKD-EPI Creatinine Equation (2021)    Anion gap 12 5 - 15    Comment: Performed at [facility], [address] [place], [place] [address]  CBC     Status: Abnormal   Collection Time: 04/08/23  4:14 AM  Result Value Ref Range   WBC 4.0 4.0 - 10.5 K/uL   RBC 2.99 (L) 4.22 - 5.81 MIL/uL   Hemoglobin 9.7 (L) 13.0 - 17.0 g/dL   HCT 25.9 (L) 56.3 - 87.5 %   MCV 99.3 80.0 - 100.0 fL   MCH 32.4 26.0 - 34.0 pg   MCHC 32.7 30.0 - 36.0 g/dL   RDW 64.3 (H) 32.9 - 51.8 %   Platelets 198 150 - 400 K/uL   nRBC 0.7 (H) 0.0 - 0.2 %    Comment: Performed at [facility], [address], [place], [place] [address]  Magnesium     Status: None   Collection Time: 04/08/23  4:14 AM  Result Value Ref Range   Magnesium 2.1 1.7 - 2.4 mg/dL    Comment: Performed at [facility], [address] [place], [place] [address]  Hemoglobin A1c     Status: Abnormal   Collection Time: 04/08/23  4:14 AM  Result Value Ref Range   Hgb A1c MFr Bld 8.8 (H) 4.8 - 5.6 %    Comment: (NOTE) Pre diabetes:          5.7%-6.4%  Diabetes:              >6.4%  Glycemic control for   <7.0% adults with diabetes    Mean Plasma Glucose 205.86 mg/dL    Comment: Performed at [facility], [address], [place], [place] [address]  CBG monitoring, ED     Status: Abnormal   Collection Time: 04/08/23  7:36 AM  Result Value Ref Range   Glucose-Capillary 345 (H) 70 - 99 mg/dL    Comment: Glucose reference range applies only to samples taken after fasting for at least 8 hours.  CBG monitoring, ED     Status: Abnormal   Collection Time: 04/08/23  9:48 AM  Result Value Ref Range   Glucose-Capillary 277 (H) 70 - 99 mg/dL    Comment: Glucose reference range applies  only to samples taken after fasting for at least 8 hours.   Comment 1 Document in Chart    Comment 2 Call MD NNP PA CNM   CBG monitoring, ED     Status: Abnormal   Collection Time: 04/08/23 12:45 PM  Result Value Ref Range   Glucose-Capillary 187 (H) 70 - 99 mg/dL    Comment: Glucose reference range applies only to samples taken after fasting for at least 8 hours.  CBG monitoring, ED     Status: Abnormal   Collection Time: 04/08/23  2:22 PM  Result Value Ref Range   Glucose-Capillary 134 (H) 70 - 99 mg/dL    Comment: Glucose reference range applies only to samples taken after fasting for at least 8 hours.  Heparin level (unfractionated)     Status: None   Collection  Time: 04/08/23  2:23 PM  Result Value Ref Range   Heparin Unfractionated 0.69 0.30 - 0.70 IU/mL    Comment: (NOTE) The clinical reportable range upper limit is being lowered to >1.10 to align with the FDA approved guidance for the current laboratory assay.  If heparin results are below expected values, and patient dosage has  been confirmed, suggest follow up testing of antithrombin III levels. Performed at [facility], [address] [place], [place] [address]   CBG monitoring, ED     Status: Abnormal   Collection Time: 04/08/23  4:35 PM  Result Value Ref Range   Glucose-Capillary 291 (H) 70 - 99 mg/dL    Comment: Glucose reference range applies only to samples taken after fasting for at least 8 hours.  CBG monitoring, ED     Status: Abnormal   Collection Time: 04/08/23  9:45 PM  Result Value Ref Range   Glucose-Capillary 325 (H) 70 - 99 mg/dL    Comment: Glucose reference range applies only to samples taken after fasting for at least 8 hours.  CBC     Status: Abnormal   Collection Time: 04/09/23  5:19 AM  Result Value Ref Range   WBC 3.9 (L) 4.0 - 10.5 K/uL   RBC 3.00 (L) 4.22 - 5.81 MIL/uL   Hemoglobin 9.6 (L) 13.0 - 17.0 g/dL   HCT 98.1 (L) 19.1 - 47.8 %   MCV 100.0 80.0 - 100.0 fL   MCH 32.0  26.0 - 34.0 pg   MCHC 32.0 30.0 - 36.0 g/dL   RDW 29.5 (H) 62.1 - 30.8 %   Platelets 168 150 - 400 K/uL   nRBC 0.5 (H) 0.0 - 0.2 %    Comment: Performed at [facility], [address] [place], [place] [address]  Heparin level (unfractionated)     Status: None   Collection Time: 04/09/23  5:19 AM  Result Value Ref Range   Heparin Unfractionated 0.38 0.30 - 0.70 IU/mL    Comment: (NOTE) The clinical reportable range upper limit is being lowered to >1.10 to align with the FDA approved guidance for the current laboratory assay.  If heparin results are below expected values, and patient dosage has  been confirmed, suggest follow up testing of antithrombin III levels. Performed at [facility], [address] [place], [place] [address]   Basic metabolic panel     Status: Abnormal   Collection Time: 04/09/23  5:19 AM  Result Value Ref Range   Sodium 140 135 - 145 mmol/L   Potassium 3.5 3.5 - 5.1 mmol/L   Chloride 105 98 - 111 mmol/L   CO2 23 22 - 32 mmol/L   Glucose, Bld 286 (H) 70 - 99 mg/dL    Comment: Glucose reference range applies only to samples taken after fasting for at least 8 hours.   BUN 21 8 - 23 mg/dL   Creatinine, Ser 5.28 0.61 - 1.24 mg/dL   Calcium 8.6 (L) 8.9 - 10.3 mg/dL   GFR, Estimated >41 >32 mL/min    Comment: (NOTE) Calculated using the CKD-EPI Creatinine Equation (2021)    Anion gap 12 5 - 15    Comment: Performed at [facility], [address] [place], [place] [address]  Lactic acid, plasma     Status: None   Collection Time: 04/09/23  5:19 AM  Result Value Ref Range   Lactic Acid, Venous 1.1 0.5 - 1.9 mmol/L    Comment: Performed at [facility], [address] [place], [place] [address]  CBG monitoring, ED     Status: Abnormal   Collection Time: 04/09/23  8:21 AM  Result Value Ref Range   Glucose-Capillary 271 (H) 70 - 99 mg/dL    Comment: Glucose reference range applies only to samples taken after  fasting for at least 8 hours.  CBG monitoring, ED     Status: Abnormal   Collection Time: 04/09/23 12:00 PM  Result Value Ref Range   Glucose-Capillary 421 (H) 70 - 99 mg/dL    Comment: Glucose reference range applies only to samples taken after fasting for at least 8 hours.  Glucose, capillary     Status: Abnormal   Collection Time: 04/09/23  4:50 PM  Result Value Ref Range   Glucose-Capillary 307 (H) 70 - 99 mg/dL    Comment: Glucose reference range applies only to samples taken after fasting for at least 8 hours.  Glucose, capillary     Status: Abnormal   Collection Time: 04/09/23  8:03 PM  Result Value Ref Range   Glucose-Capillary 312 (H) 70 - 99 mg/dL    Comment: Glucose reference range applies only to samples taken after fasting for at least 8 hours.  Glucose, capillary     Status: Abnormal   Collection Time: 04/10/23  8:40 AM  Result Value Ref Range   Glucose-Capillary 270 (H) 70 - 99 mg/dL    Comment: Glucose reference range applies only to samples taken after fasting for at least 8 hours.  ECHOCARDIOGRAM COMPLETE     Status: None   Collection Time: 04/10/23 10:00 AM  Result Value Ref Range   Weight 2,998.26 oz   Height 65 in   BP 139/83 mmHg   Ao pk vel 1.34 m/s   AV Area VTI 2.91 cm2   AR max vel 3.06 cm2   AV Mean grad 3.0 mmHg   AV Peak grad 7.2 mmHg   Single Plane A2C EF 41.5 %   Single Plane A4C EF 54.7 %   Calc EF 48.7 %   S' Lateral 3.40 cm   AV Area mean vel 3.19 cm2   Area-P 1/2 5.62 cm2   MV VTI 2.44 cm2   Est EF 40 - 45%   Glucose, capillary     Status: Abnormal   Collection Time: 04/10/23 11:43 AM  Result Value Ref Range   Glucose-Capillary 299 (H) 70 - 99 mg/dL    Comment: Glucose reference range applies only to samples taken after fasting for at least 8 hours.  Glucose, capillary     Status: Abnormal   Collection Time: 04/10/23  3:40 PM  Result Value Ref Range   Glucose-Capillary 271 (H) 70 - 99 mg/dL    Comment: Glucose reference range applies  only to samples taken after fasting for at least 8 hours.  Glucose, capillary     Status: Abnormal   Collection Time: 04/10/23  9:19 PM  Result Value Ref Range   Glucose-Capillary 193 (H) 70 - 99 mg/dL    Comment: Glucose reference range applies only to samples taken after fasting for at least 8 hours.  Glucose, capillary     Status: Abnormal   Collection Time: 04/11/23  8:05 AM  Result Value Ref Range   Glucose-Capillary 231 (H) 70 - 99 mg/dL    Comment: Glucose reference range applies only to samples taken after fasting for at least 8 hours.  Glucose, capillary     Status: Abnormal   Collection Time:  04/11/23 12:05 PM  Result Value Ref Range   Glucose-Capillary 419 (H) 70 - 99 mg/dL    Comment: Glucose reference range applies only to samples taken after fasting for at least 8 hours.    Assessment/Plan:  Atherosclerosis of native arteries of the extremities with ulceration (HCC) He had duplex studies of the arteries performed at the hospital recently.  This showed monophasic wave flow in the right iliac and femoral system worrisome for an iliac artery occlusion.  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  This was not thrombosed at that time.  There were also bilateral SFA occlusions by duplex.  I think there is a fair chance that the iliac artery is not thrombosed and that the monophasic flow is just indicative of the aneurysmal flow and our hospital could not differentiate between stenosis and aneurysm.  Nonetheless, there is an SFA occlusion with wounds on that right side and is also possible that he has thrombosed the aneurysm.  If the aneurysm is thrombosed, this is going to be a very difficult revascularization and the risk of limb loss will be excessive.  The risk of limb loss is high in both lower extremities just from the arterial insufficiency and the extensive ulcerations.  This is clearly a  critical and limb threatening situation.  I would recommend a right lower extremity angiogram near future at his convenience likely followed by a left lower extremity angiogram subsequently  Essential (primary) hypertension blood pressure control important in reducing the progression of atherosclerotic disease. On appropriate oral medications.   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  This showed monophasic wave flow in the right iliac and femoral system worrisome for an iliac artery occlusion.  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  This was not thrombosed at that time.  There were also bilateral SFA occlusions by duplex.  This could be thrombosed or this could just be a poor reading by our hospital ultrasound which is certainly possible.  Diabetes blood glucose control important in reducing the progression of atherosclerotic disease. Also, involved in wound healing. On appropriate medications.   Prostate cancer [facility]) Likely the cause of his recent PE and DVT as well as possible increasing his risk of thrombosis of the aneurysm.  Acute pulmonary embolism (HCC) On anticoagulation.      Festus Barren 05/26/2023, 12:48 PM   This note was created with Dragon medical transcription system.  Any errors from dictation are unintentional.

## 2023-05-29 ENCOUNTER — Ambulatory Visit (INDEPENDENT_AMBULATORY_CARE_PROVIDER_SITE_OTHER): Payer: Medicare (Managed Care) | Admitting: Nurse Practitioner

## 2023-05-29 ENCOUNTER — Inpatient Hospital Stay
Admission: EM | Admit: 2023-05-29 | Discharge: 2023-06-01 | DRG: 871 | Disposition: A | Discharge status: Nursing Home (Includes ICF) | Payer: Medicare (Managed Care) | Source: Skilled Nursing Facility | Attending: Internal Medicine | Admitting: Internal Medicine

## 2023-05-29 ENCOUNTER — Emergency Department: Payer: Medicare (Managed Care)

## 2023-05-29 ENCOUNTER — Inpatient Hospital Stay: Payer: Medicare (Managed Care) | Admitting: Oncology

## 2023-05-29 ENCOUNTER — Inpatient Hospital Stay: Payer: Medicare (Managed Care)

## 2023-05-29 ENCOUNTER — Encounter: Payer: Self-pay | Admitting: Emergency Medicine

## 2023-05-29 ENCOUNTER — Telehealth: Payer: Self-pay | Admitting: Oncology

## 2023-05-29 DIAGNOSIS — E785 Hyperlipidemia, unspecified: Secondary | ICD-10-CM | POA: Diagnosis present

## 2023-05-29 DIAGNOSIS — N3001 Acute cystitis with hematuria: Secondary | ICD-10-CM | POA: Diagnosis present

## 2023-05-29 DIAGNOSIS — Z833 Family history of diabetes mellitus: Secondary | ICD-10-CM

## 2023-05-29 DIAGNOSIS — D649 Anemia, unspecified: Secondary | ICD-10-CM | POA: Diagnosis present

## 2023-05-29 DIAGNOSIS — Z9889 Other specified postprocedural states: Secondary | ICD-10-CM

## 2023-05-29 DIAGNOSIS — Z9842 Cataract extraction status, left eye: Secondary | ICD-10-CM

## 2023-05-29 DIAGNOSIS — Z906 Acquired absence of other parts of urinary tract: Secondary | ICD-10-CM

## 2023-05-29 DIAGNOSIS — Z1624 Resistance to multiple antibiotics: Secondary | ICD-10-CM | POA: Diagnosis present

## 2023-05-29 DIAGNOSIS — I110 Hypertensive heart disease with heart failure: Secondary | ICD-10-CM | POA: Diagnosis present

## 2023-05-29 DIAGNOSIS — N390 Urinary tract infection, site not specified: Secondary | ICD-10-CM

## 2023-05-29 DIAGNOSIS — G309 Alzheimer's disease, unspecified: Secondary | ICD-10-CM | POA: Diagnosis present

## 2023-05-29 DIAGNOSIS — Z87891 Personal history of nicotine dependence: Secondary | ICD-10-CM | POA: Diagnosis not present

## 2023-05-29 DIAGNOSIS — Z7984 Long term (current) use of oral hypoglycemic drugs: Secondary | ICD-10-CM | POA: Diagnosis not present

## 2023-05-29 DIAGNOSIS — R6520 Severe sepsis without septic shock: Secondary | ICD-10-CM | POA: Diagnosis present

## 2023-05-29 DIAGNOSIS — Z800 Family history of malignant neoplasm of digestive organs: Secondary | ICD-10-CM

## 2023-05-29 DIAGNOSIS — A498 Other bacterial infections of unspecified site: Secondary | ICD-10-CM | POA: Insufficient documentation

## 2023-05-29 DIAGNOSIS — E872 Acidosis, unspecified: Secondary | ICD-10-CM | POA: Diagnosis present

## 2023-05-29 DIAGNOSIS — C7951 Secondary malignant neoplasm of bone: Secondary | ICD-10-CM | POA: Diagnosis present

## 2023-05-29 DIAGNOSIS — D6489 Other specified anemias: Secondary | ICD-10-CM | POA: Diagnosis present

## 2023-05-29 DIAGNOSIS — Z87440 Personal history of urinary (tract) infections: Secondary | ICD-10-CM

## 2023-05-29 DIAGNOSIS — Z95810 Presence of automatic (implantable) cardiac defibrillator: Secondary | ICD-10-CM

## 2023-05-29 DIAGNOSIS — Z961 Presence of intraocular lens: Secondary | ICD-10-CM | POA: Diagnosis present

## 2023-05-29 DIAGNOSIS — I10 Essential (primary) hypertension: Secondary | ICD-10-CM | POA: Diagnosis not present

## 2023-05-29 DIAGNOSIS — A4151 Sepsis due to Escherichia coli [E. coli]: Principal | ICD-10-CM | POA: Diagnosis present

## 2023-05-29 DIAGNOSIS — E1165 Type 2 diabetes mellitus with hyperglycemia: Secondary | ICD-10-CM | POA: Diagnosis present

## 2023-05-29 DIAGNOSIS — I2782 Chronic pulmonary embolism: Secondary | ICD-10-CM

## 2023-05-29 DIAGNOSIS — Z7901 Long term (current) use of anticoagulants: Secondary | ICD-10-CM | POA: Diagnosis not present

## 2023-05-29 DIAGNOSIS — I420 Dilated cardiomyopathy: Secondary | ICD-10-CM | POA: Diagnosis present

## 2023-05-29 DIAGNOSIS — Z79899 Other long term (current) drug therapy: Secondary | ICD-10-CM

## 2023-05-29 DIAGNOSIS — C61 Malignant neoplasm of prostate: Secondary | ICD-10-CM | POA: Diagnosis present

## 2023-05-29 DIAGNOSIS — R410 Disorientation, unspecified: Secondary | ICD-10-CM | POA: Diagnosis not present

## 2023-05-29 DIAGNOSIS — Z6372 Alcoholism and drug addiction in family: Secondary | ICD-10-CM

## 2023-05-29 DIAGNOSIS — Z86718 Personal history of other venous thrombosis and embolism: Secondary | ICD-10-CM

## 2023-05-29 DIAGNOSIS — I440 Atrioventricular block, first degree: Secondary | ICD-10-CM | POA: Diagnosis present

## 2023-05-29 DIAGNOSIS — Z86711 Personal history of pulmonary embolism: Secondary | ICD-10-CM

## 2023-05-29 DIAGNOSIS — Z1612 Extended spectrum beta lactamase (ESBL) resistance: Secondary | ICD-10-CM | POA: Diagnosis present

## 2023-05-29 DIAGNOSIS — I5022 Chronic systolic (congestive) heart failure: Secondary | ICD-10-CM | POA: Diagnosis present

## 2023-05-29 DIAGNOSIS — G9341 Metabolic encephalopathy: Secondary | ICD-10-CM | POA: Diagnosis present

## 2023-05-29 DIAGNOSIS — Z8249 Family history of ischemic heart disease and other diseases of the circulatory system: Secondary | ICD-10-CM

## 2023-05-29 DIAGNOSIS — Z803 Family history of malignant neoplasm of breast: Secondary | ICD-10-CM

## 2023-05-29 DIAGNOSIS — R4182 Altered mental status, unspecified: Principal | ICD-10-CM

## 2023-05-29 DIAGNOSIS — Z811 Family history of alcohol abuse and dependence: Secondary | ICD-10-CM

## 2023-05-29 DIAGNOSIS — D630 Anemia in neoplastic disease: Secondary | ICD-10-CM | POA: Diagnosis present

## 2023-05-29 DIAGNOSIS — I2699 Other pulmonary embolism without acute cor pulmonale: Secondary | ICD-10-CM | POA: Diagnosis present

## 2023-05-29 DIAGNOSIS — A419 Sepsis, unspecified organism: Secondary | ICD-10-CM | POA: Diagnosis not present

## 2023-05-29 DIAGNOSIS — F0280 Dementia in other diseases classified elsewhere without behavioral disturbance: Secondary | ICD-10-CM

## 2023-05-29 DIAGNOSIS — E119 Type 2 diabetes mellitus without complications: Secondary | ICD-10-CM

## 2023-05-29 DIAGNOSIS — Z807 Family history of other malignant neoplasms of lymphoid, hematopoietic and related tissues: Secondary | ICD-10-CM

## 2023-05-29 DIAGNOSIS — Z9841 Cataract extraction status, right eye: Secondary | ICD-10-CM

## 2023-05-29 LAB — COMPREHENSIVE METABOLIC PANEL
ALT: 11 U/L (ref 0–44)
AST: 18 U/L (ref 15–41)
Albumin: 2.9 g/dL — ABNORMAL LOW (ref 3.5–5.0)
Alkaline Phosphatase: 330 U/L — ABNORMAL HIGH (ref 38–126)
Anion gap: 9 (ref 5–15)
BUN: 29 mg/dL — ABNORMAL HIGH (ref 8–23)
CO2: 24 mmol/L (ref 22–32)
Calcium: 9 mg/dL (ref 8.9–10.3)
Chloride: 101 mmol/L (ref 98–111)
Creatinine, Ser: 1.07 mg/dL (ref 0.61–1.24)
GFR, Estimated: >60 mL/min (ref >60)
Glucose, Bld: 429 mg/dL — ABNORMAL HIGH (ref 70–99)
Potassium: 4.4 mmol/L (ref 3.5–5.1)
Sodium: 134 mmol/L — ABNORMAL LOW (ref 135–145)
Total Bilirubin: 0.7 mg/dL (ref 0.0–1.2)
Total Protein: 7.1 g/dL (ref 6.5–8.1)

## 2023-05-29 LAB — URINALYSIS, ROUTINE W REFLEX MICROSCOPIC
Bilirubin Urine: NEGATIVE
Glucose, UA: >=500 mg/dL — AB
Ketones, ur: NEGATIVE mg/dL
Nitrite: NEGATIVE
Protein, ur: NEGATIVE mg/dL
Specific Gravity, Urine: 1.011 (ref 1.005–1.030)
pH: 5 (ref 5.0–8.0)

## 2023-05-29 LAB — CBC WITH DIFFERENTIAL/PLATELET
Abs Immature Granulocytes: 0.03 10*3/uL (ref 0.00–0.07)
Basophils Absolute: 0 10*3/uL (ref 0.0–0.1)
Basophils Relative: 0 %
Eosinophils Absolute: 0.1 10*3/uL (ref 0.0–0.5)
Eosinophils Relative: 1 %
HCT: 26.5 % — ABNORMAL LOW (ref 39.0–52.0)
Hemoglobin: 8.3 g/dL — ABNORMAL LOW (ref 13.0–17.0)
Immature Granulocytes: 1 %
Lymphocytes Relative: 10 %
Lymphs Abs: 0.5 10*3/uL — ABNORMAL LOW (ref 0.7–4.0)
MCH: 32.4 pg (ref 26.0–34.0)
MCHC: 31.3 g/dL (ref 30.0–36.0)
MCV: 103.5 fL — ABNORMAL HIGH (ref 80.0–100.0)
Monocytes Absolute: 0.4 10*3/uL (ref 0.1–1.0)
Monocytes Relative: 8 %
Neutro Abs: 3.7 10*3/uL (ref 1.7–7.7)
Neutrophils Relative %: 80 %
Platelets: 269 10*3/uL (ref 150–400)
RBC: 2.56 MIL/uL — ABNORMAL LOW (ref 4.22–5.81)
RDW: 17.5 % — ABNORMAL HIGH (ref 11.5–15.5)
WBC: 4.7 10*3/uL (ref 4.0–10.5)
nRBC: 1.1 % — ABNORMAL HIGH (ref 0.0–0.2)

## 2023-05-29 LAB — CBG MONITORING, ED
Glucose-Capillary: 408 mg/dL — ABNORMAL HIGH (ref 70–99)
Glucose-Capillary: 249 mg/dL — ABNORMAL HIGH (ref 70–99)

## 2023-05-29 LAB — LACTIC ACID, PLASMA
Lactic Acid, Venous: 3.1 mmol/L (ref 0.5–1.9)
Lactic Acid, Venous: 3.4 mmol/L (ref 0.5–1.9)

## 2023-05-29 LAB — APTT: aPTT: 46 s — ABNORMAL HIGH (ref 24–36)

## 2023-05-29 LAB — PROTIME-INR
INR: 1.6 — ABNORMAL HIGH (ref 0.8–1.2)
Prothrombin Time: 19 s — ABNORMAL HIGH (ref 11.4–15.2)

## 2023-05-29 MED ORDER — LACTATED RINGERS IV SOLN: INTRAVENOUS | Status: AC

## 2023-05-29 MED ORDER — TAMSULOSIN HCL 0.4 MG PO CAPS
0.4000 mg | ORAL_CAPSULE | Freq: Every day | ORAL | Status: DC
  Administered 2023-05-30 – 2023-06-01 (×3): 0.4 mg via ORAL
  Filled 2023-05-29 (×3): qty 1

## 2023-05-29 MED ORDER — APIXABAN 5 MG PO TABS
5.0000 mg | ORAL_TABLET | Freq: Two times a day (BID) | ORAL | Status: DC
  Administered 2023-05-29 – 2023-06-01 (×6): 5 mg via ORAL
  Filled 2023-05-29 (×6): qty 1

## 2023-05-29 MED ORDER — ACETAMINOPHEN 325 MG PO TABS
650.0000 mg | ORAL_TABLET | Freq: Four times a day (QID) | ORAL | Status: DC | PRN
  Administered 2023-05-29 – 2023-05-31 (×2): 650 mg via ORAL
  Filled 2023-05-29 (×2): qty 2

## 2023-05-29 MED ORDER — POLYETHYLENE GLYCOL 3350 17 G PO PACK: 17.0000 g | PACK | Freq: Every day | ORAL | Status: DC | PRN

## 2023-05-29 MED ORDER — SODIUM CHLORIDE 0.9 % IV SOLN
2.0000 g | Freq: Once | INTRAVENOUS | Status: AC
  Administered 2023-05-29: 2 g via INTRAVENOUS
  Filled 2023-05-29: qty 12.5

## 2023-05-29 MED ORDER — ACETAMINOPHEN 650 MG RE SUPP: 650.0000 mg | Freq: Four times a day (QID) | RECTAL | Status: DC | PRN

## 2023-05-29 MED ORDER — INSULIN ASPART 100 UNIT/ML IJ SOLN
0.0000 [IU] | Freq: Three times a day (TID) | INTRAMUSCULAR | Status: DC
  Administered 2023-05-29: 5 [IU] via SUBCUTANEOUS
  Administered 2023-05-30: 3 [IU] via SUBCUTANEOUS
  Administered 2023-05-30: 11 [IU] via SUBCUTANEOUS
  Administered 2023-05-30: 15 [IU] via SUBCUTANEOUS
  Administered 2023-05-31 – 2023-06-01 (×4): 5 [IU] via SUBCUTANEOUS
  Filled 2023-05-29 (×8): qty 1

## 2023-05-29 MED ORDER — ONDANSETRON HCL 4 MG/2ML IJ SOLN: 4.0000 mg | Freq: Four times a day (QID) | INTRAMUSCULAR | Status: DC | PRN

## 2023-05-29 MED ORDER — ONDANSETRON HCL 4 MG PO TABS: 4.0000 mg | ORAL_TABLET | Freq: Four times a day (QID) | ORAL | Status: DC | PRN

## 2023-05-29 MED ORDER — SODIUM CHLORIDE 0.9 % IV SOLN
1.0000 g | Freq: Three times a day (TID) | INTRAVENOUS | Status: DC
  Administered 2023-05-29 – 2023-06-01 (×9): 1 g via INTRAVENOUS
  Filled 2023-05-29 (×10): qty 20

## 2023-05-29 MED ORDER — LACTATED RINGERS IV BOLUS
1000.0000 mL | Freq: Once | INTRAVENOUS | Status: AC
  Administered 2023-05-29: 1000 mL via INTRAVENOUS

## 2023-05-29 MED ORDER — SODIUM CHLORIDE 0.9% FLUSH
3.0000 mL | Freq: Two times a day (BID) | INTRAVENOUS | Status: DC
  Administered 2023-05-29 – 2023-05-31 (×5): 3 mL via INTRAVENOUS

## 2023-05-29 MED ORDER — LACTATED RINGERS IV BOLUS
500.0000 mL | Freq: Once | INTRAVENOUS | Status: AC
  Administered 2023-05-29: 500 mL via INTRAVENOUS

## 2023-05-29 NOTE — ED Provider Notes (Signed)
-----------------------------------------   5:09 PM on 05/29/2023 ----------------------------------------- I have assumed care from Dr. Katrinka Blazing.  Patient's urinalysis continues to show 21-50 white cells and rare bacteria.  Lactic acid is elevated.  I spoke to the patient's pace physician who states they received the urine culture today and it is multidrug-resistant but it is sensitive to meropenem although resistant to cefepime which we will initially started.  We will discontinue cefepime and instead start the patient on meropenem.  Given the patient's elevated lactic acid altered mental status and continued urinary tract infection we will admit to the hospital service for IV antibiotics.  Patient agreeable to plan of care.   Minna Antis, MD 05/29/23 [phone]

## 2023-05-29 NOTE — ED Notes (Signed)
 [facility] contacted to place pt on cental tele.

## 2023-05-29 NOTE — Assessment & Plan Note (Signed)
 Notably hyperglycemic on admission with no evidence of DKA.  - SSI, moderate - Hold home regimen

## 2023-05-29 NOTE — ED Notes (Signed)
 In report advised that pt has been urinating a lot since arrival. PureWick in place and emptied from canister. Pt urinated more while RN in room prior to bladder scan. Bladder scan at this time.

## 2023-05-29 NOTE — Assessment & Plan Note (Signed)
 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  Alkaline phosphatase today is elevated compared to prior concerning for continued progression of bony mets.  - Hold home Olarparib

## 2023-05-29 NOTE — Assessment & Plan Note (Addendum)
 Patient is presenting with altered mental status, found to be tachycardic, tachypneic in the setting of multidrug-resistant UTI.  Lactic acid is elevated, however no evidence of hypotension.  - Telemetry monitoring - S/p 500 cc bolus - Give additional 1 L with continuous maintenance fluids - Urine culture and blood cultures pending

## 2023-05-29 NOTE — Assessment & Plan Note (Signed)
 Patient has a history of HFrEF with severe dilated cardiomyopathy with EF as low as 22% in 2017.  EF has recovered, last checked in February 2025, at which time it was 40-45%.  Patient appears euvolemic at this time and is high risk for hypovolemia secondary to sepsis.  - Hold home diuretics - Daily weights - Strict in and out

## 2023-05-29 NOTE — Assessment & Plan Note (Signed)
 Per chart review, patient has a long-term history of cognitive decline, with recent neurology consultation in January at which time it was felt to be most consistent with Alzheimer's dementia.  - Continue home regimen

## 2023-05-29 NOTE — ED Provider Notes (Signed)
 [facility] Provider Note    Event Date/Time   First MD Initiated Contact with Patient 05/29/23 1347     (approximate)   History   Altered Mental Status   HPI  Matthew Mcgrath is a 78 y.o. male who presents to the ED for evaluation of Altered Mental Status   I reviewed medical DC summary from February.  History of HTN, HLD, DM, AICD, mildly reduced EF.  Admitted for an acute PE, transition to Eliquis.  Patient presented to the ED due to altered mentation over the past day or so.  Majority of history is provided by his siblings, brother and sister at the bedside.  They saw him over the weekend, 2 days ago, when they were celebrating the birthday of their deceased mother and he was interacting appropriately, festive at his nursing facility.  Today though he was been somnolent, falling asleep while eating and clearly not himself.  Did not know of any particular acute event such as falls  History of CHF and received 500 cc prehospital with EMS due to hyperglycemia  Reportedly a recent UTI diagnosis with positive culture that I cannot visualize in the chart  Physical Exam   Triage Vital Signs: ED Triage Vitals  Encounter Vitals Group     BP 05/29/23 1344 134/81     Systolic BP Percentile --      Diastolic BP Percentile --      Pulse Rate 05/29/23 1344 99     Resp 05/29/23 1344 (!) 25     Temp 05/29/23 1358 98.9 F (37.2 C)     Temp Source 05/29/23 1358 Oral     SpO2 05/29/23 1344 95 %     Weight --      Height --      Head Circumference --      Peak Flow --      Pain Score --      Pain Loc --      Pain Education --      Exclude from Growth Chart --     Most recent vital signs: Vitals:   05/29/23 1344 05/29/23 1358  BP: 134/81   Pulse: 99   Resp: (!) 25   Temp:  98.9 F (37.2 C)  SpO2: 95%     General: Sleepy, no distress.  Opens his eyes to vocal stimulation, not following commands CV:  Good peripheral perfusion.  Resp:  Normal  effort.  Abd:  No distention.  MSK:  No deformity noted.  Neuro:  No focal deficits appreciated. Other:     ED Results / Procedures / Treatments   Labs (all labs ordered are listed, but only abnormal results are displayed) Labs Reviewed  LACTIC ACID, PLASMA - Abnormal; Notable for the following components:      Result Value   Lactic Acid, Venous 3.1 (*)    All other components within normal limits  COMPREHENSIVE METABOLIC PANEL - Abnormal; Notable for the following components:   Sodium 134 (*)    Glucose, Bld 429 (*)    BUN 29 (*)    Albumin 2.9 (*)    Alkaline Phosphatase 330 (*)    All other components within normal limits  CBC WITH DIFFERENTIAL/PLATELET - Abnormal; Notable for the following components:   RBC 2.56 (*)    Hemoglobin 8.3 (*)    HCT 26.5 (*)    MCV 103.5 (*)    RDW 17.5 (*)    nRBC 1.1 (*)  Lymphs Abs 0.5 (*)    All other components within normal limits  PROTIME-INR - Abnormal; Notable for the following components:   Prothrombin Time 19.0 (*)    INR 1.6 (*)    All other components within normal limits  APTT - Abnormal; Notable for the following components:   aPTT 46 (*)    All other components within normal limits  URINALYSIS, ROUTINE W REFLEX MICROSCOPIC - Abnormal; Notable for the following components:   Color, Urine STRAW (*)    APPearance HAZY (*)    Glucose, UA >=500 (*)    Hgb urine dipstick SMALL (*)    Leukocytes,Ua MODERATE (*)    Bacteria, UA RARE (*)    All other components within normal limits  CBG MONITORING, ED - Abnormal; Notable for the following components:   Glucose-Capillary 408 (*)    All other components within normal limits  CULTURE, BLOOD (ROUTINE X 2)  CULTURE, BLOOD (ROUTINE X 2)  URINE CULTURE  LACTIC ACID, PLASMA    EKG Sinus rhythm with a rate of 98 bpm.  Normal axis.  First-degree AV block.  No high-grade AV block.  No STEMI.  RADIOLOGY   Official radiology report(s): No results found.  PROCEDURES and  INTERVENTIONS:  .1-3 Lead EKG Interpretation  Performed by: Delton Prairie, MD Authorized by: Delton Prairie, MD     Interpretation: normal     ECG rate:  90   ECG rate assessment: normal     Rhythm: sinus rhythm     Ectopy: none     Conduction: normal     Medications  ceFEPIme (MAXIPIME) 2 g in sodium chloride 0.9 % 100 mL IVPB (2 g Intravenous New Bag/Given 05/29/23 1523)  lactated ringers bolus 500 mL (500 mLs Intravenous New Bag/Given 05/29/23 1525)     IMPRESSION / MDM / ASSESSMENT AND PLAN / ED COURSE  I reviewed the triage vital signs and the nursing notes.  Differential diagnosis includes, but is not limited to, ICH, metabolic encephalopathy, sepsis, bacteremia  {Patient presents with symptoms of an acute illness or injury that is potentially life-threatening.  Anticoagulated patient was recently diagnosed with cystitis presents to the ED acutely altered concerning for progression of infectious etiology or sepsis.  Vital signs are mildly deranged with borderline elevated heart rate and respiratory rate but hemodynamically stable without signs of shock.  Blood work with lactic acidosis that we will trend with fluid.  Urine concerning for infection we will send for another culture as well as blood cultures.  Hyperglycemia but no DKA.  Normal WBC.  Pending CT scan of the head to assess for intracranial pathology prior to expected admission.      FINAL CLINICAL IMPRESSION(S) / ED DIAGNOSES   Final diagnoses:  Altered mental status, unspecified altered mental status type     Rx / DC Orders   ED Discharge Orders     None        Note:  This document was prepared using Dragon voice recognition software and may include unintentional dictation errors.   Delton Prairie, MD 05/29/23 [phone]

## 2023-05-29 NOTE — Consult Note (Signed)
 Pharmacy Antibiotic Note  Matthew Mcgrath is a 78 y.o. male admitted on 05/29/2023 with UTI.  ESBL reported by outside physician to the ED provider. Pharmacy has been consulted for meropenem dosing.  Plan: Start meropenem 1 gram IV every 8 hours Follow-up renal function and cultures for adjustments     Temp (24hrs), Avg:98.9 F (37.2 C), Min:98.9 F (37.2 C), Max:98.9 F (37.2 C)  Recent Labs  Lab 05/29/23 1356 05/29/23 1359 05/29/23 1613  WBC  --  4.7  --   CREATININE  --  1.07  --   LATICACIDVEN 3.1*  --  3.4*    Estimated Creatinine Clearance: 59.7 mL/min (by C-G formula based on SCr of 1.07 mg/dL).    No Known Allergies  Antimicrobials this admission: Meropenem 3/24 >>  Cefepime 3/24 x 1  Dose adjustments this admission: N/A  Microbiology results: 3/24 BCx: pending 3/24 UCx: pending    Thank you for allowing pharmacy to be a part of this patient's care.  Barrie Folk 05/29/2023 6:14 PM

## 2023-05-29 NOTE — Assessment & Plan Note (Signed)
 Recent admission for PE, on Eliquis.  No evidence of hypoxia at this time.  Suspect tachycardia is due to sepsis.  - Continue home Eliquis

## 2023-05-29 NOTE — Assessment & Plan Note (Signed)
 Hold home antihypertensives

## 2023-05-29 NOTE — ED Triage Notes (Addendum)
 Pt BIB [facility] with reports of AMS. Pt has known positive urine culture. Pt received 500cc LR en route via [facility]. BS >600.

## 2023-05-29 NOTE — Assessment & Plan Note (Addendum)
 History of chronic anemia with hemoglobin between 9-10.  Hemoglobin today is 8.3 with no evidence of acute bleed.  Potentially reactive in the setting of sepsis.  - Recheck CBC in the a.m.

## 2023-05-29 NOTE — Assessment & Plan Note (Signed)
 Apparently patient's facility has been treating him for UTI for 2 days now but his culture data just returned today and showed multidrug resistance (including cefepime).  Will need to clarify with pace physician exactly what bacteria was growing.  - Continue meropenem per pharmacy dosing - Urine culture pending

## 2023-05-29 NOTE — Telephone Encounter (Signed)
 [facility] nurse called and asked to r/s his appointments due to going to the ER. Appointments rescheduled as requested

## 2023-05-29 NOTE — Assessment & Plan Note (Signed)
 Patient has been increasingly lethargic and confused compared to baseline in the setting of acute infection suspected to be delirium.  He has a history of Alzheimer's dementia as well.  - Delirium precautions

## 2023-05-29 NOTE — H&P (Addendum)
 History and Physical    Patient: Matthew Mcgrath JYN:[id number] DOB: [birth year] DOA: 05/29/2023 DOS: the patient was seen and examined on 05/29/2023 PCP: [facility]  Patient coming from: ALF/ILF  Chief Complaint:  Chief Complaint  Patient presents with   Altered Mental Status   HPI: Matthew Mcgrath is a 78 y.o. male with medical history significant of hypertension, hyperlipidemia, type 2 diabetes, HFrEF with recovered EF (last 40-45%) s/p AICD, prostate cancer with metastasis to the bone on Olarparnib, Alzheimer's dementia, pulmonary embolism on Eliquis who presents to the ED due to altered mental status.  History obtained from patient's sisters at bedside and through chart review.  Patient has been acting unlike himself for the last 2 days and has been increasingly lethargic. His facility recently tested him for UTI and started him on an unknown antibiotic.  Pace physician called EDP today and noted that culture data showed it was resistant to both cefepime and the oral antibiotic he was started on.  Family has not noticed any fever, chills, nausea, vomiting, diarrhea.  ED course: On arrival to the ED, patient was normotensive at 134/81 with heart rate of 101.  He was saturating at 95% on room air.  He was afebrile at 98.9.  Initial workup notable for hemoglobin of 8.3, MCV of 103, glucose 429, BUN 29, creat 1.07 with GFR above 60.  Lactic acid 3.1 and then 3.4.  Urinalysis with small hematuria, moderate leukocytes and rare bacteria.  Chest x-ray with no acute disease.  CT head obtained and pending.  Patient initially given cefepime and then meropenem.  TRH contacted for admission.  Review of Systems: unable to review all systems due to the inability of the patient to answer questions.  Past Medical History:  Diagnosis Date   Cardiomyopathy (HCC)    Congestive heart failure (HCC)    Diabetes mellitus type 2, controlled (HCC)    Elevated lipids    Family history of breast  cancer    Family history of colon cancer    HTN (hypertension)    Past Surgical History:  Procedure Laterality Date   CARDIAC CATHETERIZATION N/A 04/14/2015   Procedure: Left Heart Cath and Coronary Angiography;  Surgeon: Marcina Millard, MD;  Location: [facility] INVASIVE CV LAB;  Service: Cardiovascular;  Laterality: N/A;   CATARACT EXTRACTION W/ INTRAOCULAR LENS  IMPLANT, BILATERAL Left    COLONOSCOPY WITH PROPOFOL N/A 04/08/2020   Procedure: COLONOSCOPY WITH PROPOFOL;  Surgeon: Toledo, Boykin Nearing, MD;  Location: [facility] ENDOSCOPY;  Service: Gastroenterology;  Laterality: N/A;   CYSTOSCOPY W/ RETROGRADES Bilateral 07/02/2021   Procedure: CYSTOSCOPY WITH RETROGRADE PYELOGRAM;  Surgeon: Sondra Come, MD;  Location: [facility] ORS;  Service: Urology;  Laterality: Bilateral;   HERNIA REPAIR     IMPLANTABLE CARDIOVERTER DEFIBRILLATOR (ICD) GENERATOR CHANGE Left 07/03/2015   Procedure: ICD IMPLANT single chamber;  Surgeon: Sharion Settler, MD;  Location: [facility] ORS;  Service: Cardiovascular;  Laterality: Left;   TRANSURETHRAL RESECTION OF BLADDER TUMOR N/A 07/02/2021   Procedure: TRANSURETHRAL RESECTION OF BLADDER TUMOR (TURBT);  Surgeon: Sondra Come, MD;  Location: [facility] ORS;  Service: Urology;  Laterality: N/A;   Social History:  reports that he quit smoking about 3 years ago. His smoking use included cigarettes. He started smoking about 33 years ago. He has a 7.5 pack-year smoking history. He has been exposed to tobacco smoke. He has never used smokeless tobacco. He reports that he does not currently use drugs after having used the following drugs:  Marijuana. Frequency: 2.00 times per week. He reports that he does not drink alcohol.  No Known Allergies  Family History  Problem Relation Age of Onset   Heart failure Mother    Heart disease Mother    Alcoholism Father    Hypertension Sister    Diabetes Sister    Hypertension Brother    Diabetes Brother    Colon cancer Brother        vs prostate    Multiple myeloma Brother    Breast cancer Cousin     Prior to Admission medications   Medication Sig Start Date End Date Taking? Authorizing Provider  acetaminophen (TYLENOL) 325 MG tablet Take 2 tablets (650 mg total) by mouth every 4 (four) hours as needed for mild pain (or temp > 37.5 C (99.5 F)). 11/29/22   Elgergawy, Leana Roe, MD  apixaban (ELIQUIS) 5 MG TABS tablet Take 10 mg twice daily for 5 days followed by 5 mg twice daily.  Continue Eliquis until further notice from your primary care physician or oncologist. 04/11/23   Lurene Shadow, MD  carvedilol (COREG) 3.125 MG tablet Take 3.125 mg by mouth 2 (two) times daily. 10/05/21   [provider]  cholecalciferol (VITAMIN D3) 25 MCG (1000 UNIT) tablet Take 1,000 Units by mouth daily.    [provider]  GLUMETZA 1000 MG 24 hr tablet Take 1,000 mg by mouth daily. 06/05/21   [provider]  LEUPROLIDE ACETATE, 6 MONTH, 45 MG injection Inject into the skin. 06/16/21   [provider]  LYNPARZA 100 MG tablet Take 100 mg by mouth 2 (two) times daily. 07/01/22   [provider]  LYNPARZA 150 MG tablet Take 150 mg by mouth 2 (two) times daily. 07/01/22   [provider]  magnesium oxide (MAG-OX) 400 (240 Mg) MG tablet Take 1 tablet by mouth daily. 06/05/21   [provider]  metFORMIN (GLUCOPHAGE-XR) 500 MG 24 hr tablet Take 500 mg by mouth daily with breakfast.    [provider]  Multiple Vitamin (ONE-DAILY MULTI-VITAMIN) TABS Take 1 tablet by mouth daily. 10/05/21   [provider]  omeprazole (PRILOSEC) 10 MG capsule Take 10 mg by mouth daily. 11/06/22   [provider]  senna (SENOKOT) 8.6 MG TABS tablet Take 2 tablets by mouth daily. 11/06/22   [provider]  spironolactone (ALDACTONE) 25 MG tablet Take 0.5 tablets (12.5 mg total) by mouth daily. 06/21/21   Alford Highland, MD  tamsulosin (FLOMAX) 0.4 MG CAPS capsule Take 1 capsule (0.4 mg total) by mouth  daily. 02/04/21   Sondra Come, MD    Physical Exam: Vitals:   05/29/23 1344 05/29/23 1358 05/29/23 1600 05/29/23 1800  BP: 134/81  121/74 (!) 143/91  Pulse: 99  (!) 102 (!) 109  Resp: (!) 25  (!) 22 20  Temp:  98.9 F (37.2 C)  98 F (36.7 C)  TempSrc:  Oral    SpO2: 95%  95% 99%   Physical Exam Vitals and nursing note reviewed.  Constitutional:      General: He is not in acute distress. HENT:     Head: Normocephalic and atraumatic.     Mouth/Throat:     Mouth: Mucous membranes are moist.     Pharynx: Oropharynx is clear.  Cardiovascular:     Rate and Rhythm: Regular rhythm. Tachycardia present.     Heart sounds: No murmur heard. Pulmonary:     Effort: Pulmonary effort is normal. No respiratory distress.  Breath sounds: Normal breath sounds.  Abdominal:     General: Bowel sounds are normal. There is no distension.     Palpations: Abdomen is soft.     Tenderness: There is no abdominal tenderness. There is no guarding.  Musculoskeletal:     Right lower leg: No edema.     Left lower leg: No edema.  Skin:    General: Skin is warm and dry.  Neurological:     Mental Status: He is alert.     Comments:  Patient is alert and oriented only to self.  No focal weakness noted    Data Reviewed: CBC with WBC of 4.7, hemoglobin of 8.3, MCV of 103, platelets of 269 CMP with sodium of 134, potassium 4.4, bicarb 24, glucose 429, BUN 29, creat 1.07, AST 18, ALT 11, alkaline phosphatase 330 and GFR above 60 Lactic acid 3.1 and repeat 3.4 INR 1.6 PTT 46 Urinalysis with hematuria, glucosuria, moderate leukocytes, rare bacteria  EKG personally reviewed.  Sinus rhythm with first-degree AV block.  Rate of 98.  No acute ischemic changes.  DG Chest Port 1 View Result Date: 05/29/2023 CLINICAL DATA:  Questionable sepsis EXAM: PORTABLE CHEST 1 VIEW COMPARISON:  Chest x-ray 04/07/2023 FINDINGS: Left-sided pacemaker is present. The heart is enlarged. There is no focal lung infiltrate,  pleural effusion or pneumothorax. No acute fractures are seen. IMPRESSION: Cardiomegaly. No active disease. Electronically Signed   By: Darliss Cheney M.D.   On: 05/29/2023 15:59   Results are pending, will review when available.  Assessment and Plan:  * Sepsis [facility]) Patient is presenting with altered mental status, found to be tachycardic, tachypneic in the setting of multidrug-resistant UTI.  Lactic acid is elevated, however no evidence of hypotension.  - Telemetry monitoring - S/p 500 cc bolus - Give additional 1 L with continuous maintenance fluids - Urine culture and blood cultures pending  Complicated UTI (urinary tract infection) Apparently patient's facility has been treating him for UTI for 2 days now but his culture data just returned today and showed multidrug resistance (including cefepime).  Will need to clarify with pace physician exactly what bacteria was growing.  - Continue meropenem per pharmacy dosing - Urine culture pending  Delirium Patient has been increasingly lethargic and confused compared to baseline in the setting of acute infection suspected to be delirium.  He has a history of Alzheimer's dementia as well.  - Delirium precautions  Anemia History of chronic anemia with hemoglobin between 9-10.  Hemoglobin today is 8.3 with no evidence of acute bleed.  Potentially reactive in the setting of sepsis.  - Recheck CBC in the a.m.  Chronic systolic heart failure (HCC) Patient has a history of HFrEF with severe dilated cardiomyopathy with EF as low as 22% in 2017.  EF has recovered, last checked in February 2025, at which time it was 40-45%.  Patient appears euvolemic at this time and is high risk for hypovolemia secondary to sepsis.  - Hold home diuretics - Daily weights - Strict in and out  Essential (primary) hypertension - Hold home antihypertensives  Pulmonary embolism (HCC) Recent admission for PE, on Eliquis.  No evidence of hypoxia at this time.   Suspect tachycardia is due to sepsis.  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  Alkaline phosphatase today is elevated compared to prior concerning for continued progression of bony mets.  - Hold home Olarparib  Alzheimer's dementia [facility]) Per chart  review, patient has a long-term history of cognitive decline, with recent neurology consultation in January at which time it was felt to be most consistent with Alzheimer's dementia.  - Continue home regimen  Diabetes (HCC) Notably hyperglycemic on admission with no evidence of DKA.  - SSI, moderate - Hold home regimen  Advance Care Planning:   Code Status: Full Code confirmed by patient's sisters at bedside  Consults: None  Family Communication: Patient's sisters updated at bedside  Severity of Illness: The appropriate patient status for this patient is INPATIENT. Inpatient status is judged to be reasonable and necessary in order to provide the required intensity of service to ensure the patient's safety. The patient's presenting symptoms, physical exam findings, and initial radiographic and laboratory data in the context of their chronic comorbidities is felt to place them at high risk for further clinical deterioration. Furthermore, it is not anticipated that the patient will be medically stable for discharge from the hospital within 2 midnights of admission.   * I certify that at the point of admission it is my clinical judgment that the patient will require inpatient hospital care spanning beyond 2 midnights from the point of admission due to high intensity of service, high risk for further deterioration and high frequency of surveillance required.*  Author: Verdene Lennert, MD 05/29/2023 6:51 PM  For on call review [email or web address].

## 2023-05-30 ENCOUNTER — Encounter: Payer: Self-pay | Admitting: Internal Medicine

## 2023-05-30 ENCOUNTER — Other Ambulatory Visit: Payer: Self-pay

## 2023-05-30 DIAGNOSIS — I5022 Chronic systolic (congestive) heart failure: Secondary | ICD-10-CM | POA: Diagnosis not present

## 2023-05-30 DIAGNOSIS — R410 Disorientation, unspecified: Secondary | ICD-10-CM | POA: Diagnosis not present

## 2023-05-30 DIAGNOSIS — A498 Other bacterial infections of unspecified site: Secondary | ICD-10-CM

## 2023-05-30 DIAGNOSIS — A419 Sepsis, unspecified organism: Secondary | ICD-10-CM | POA: Diagnosis not present

## 2023-05-30 DIAGNOSIS — Z1612 Extended spectrum beta lactamase (ESBL) resistance: Secondary | ICD-10-CM

## 2023-05-30 LAB — BASIC METABOLIC PANEL
Anion gap: 11 (ref 5–15)
BUN: 21 mg/dL (ref 8–23)
CO2: 24 mmol/L (ref 22–32)
Calcium: 8.7 mg/dL — ABNORMAL LOW (ref 8.9–10.3)
Chloride: 104 mmol/L (ref 98–111)
Creatinine, Ser: 0.83 mg/dL (ref 0.61–1.24)
GFR, Estimated: >60 mL/min (ref >60)
Glucose, Bld: 202 mg/dL — ABNORMAL HIGH (ref 70–99)
Potassium: 3.8 mmol/L (ref 3.5–5.1)
Sodium: 139 mmol/L (ref 135–145)

## 2023-05-30 LAB — LACTIC ACID, PLASMA
Lactic Acid, Venous: 1.3 mmol/L (ref 0.5–1.9)
Lactic Acid, Venous: 2.8 mmol/L (ref 0.5–1.9)

## 2023-05-30 LAB — GLUCOSE, CAPILLARY
Glucose-Capillary: 370 mg/dL — ABNORMAL HIGH (ref 70–99)
Glucose-Capillary: 336 mg/dL — ABNORMAL HIGH (ref 70–99)

## 2023-05-30 LAB — CBC
HCT: 26.5 % — ABNORMAL LOW (ref 39.0–52.0)
Hemoglobin: 8.4 g/dL — ABNORMAL LOW (ref 13.0–17.0)
MCH: 31.9 pg (ref 26.0–34.0)
MCHC: 31.7 g/dL (ref 30.0–36.0)
MCV: 100.8 fL — ABNORMAL HIGH (ref 80.0–100.0)
Platelets: 253 10*3/uL (ref 150–400)
RBC: 2.63 MIL/uL — ABNORMAL LOW (ref 4.22–5.81)
RDW: 17.3 % — ABNORMAL HIGH (ref 11.5–15.5)
WBC: 4.1 10*3/uL (ref 4.0–10.5)
nRBC: 0.5 % — ABNORMAL HIGH (ref 0.0–0.2)

## 2023-05-30 LAB — CBG MONITORING, ED
Glucose-Capillary: 194 mg/dL — ABNORMAL HIGH (ref 70–99)
Glucose-Capillary: 314 mg/dL — ABNORMAL HIGH (ref 70–99)

## 2023-05-30 LAB — MRSA NEXT GEN BY PCR, NASAL: MRSA by PCR Next Gen: NOT DETECTED

## 2023-05-30 MED ORDER — INSULIN ASPART 100 UNIT/ML IJ SOLN
0.0000 [IU] | Freq: Every day | INTRAMUSCULAR | Status: DC
  Administered 2023-05-30: 4 [IU] via SUBCUTANEOUS
  Administered 2023-05-31: 5 [IU] via SUBCUTANEOUS
  Filled 2023-05-30 (×2): qty 1

## 2023-05-30 MED ORDER — CARVEDILOL 3.125 MG PO TABS
3.1250 mg | ORAL_TABLET | Freq: Two times a day (BID) | ORAL | Status: DC
  Administered 2023-05-30 – 2023-06-01 (×4): 3.125 mg via ORAL
  Filled 2023-05-30 (×4): qty 1

## 2023-05-30 MED ORDER — INSULIN GLARGINE-YFGN 100 UNIT/ML ~~LOC~~ SOLN
8.0000 [IU] | Freq: Every day | SUBCUTANEOUS | Status: DC
  Administered 2023-05-30 – 2023-05-31 (×2): 8 [IU] via SUBCUTANEOUS
  Filled 2023-05-30 (×3): qty 0.08

## 2023-05-30 NOTE — Inpatient Diabetes Management (Signed)
 Inpatient Diabetes Program Recommendations  AACE/ADA: New Consensus Statement on Inpatient Glycemic Control   Target Ranges:  Prepandial:   less than 140 mg/dL      Peak postprandial:   less than 180 mg/dL (1-2 hours)      Critically ill patients:  140 - 180 mg/dL    Latest Reference Range & Units 05/29/23 13:55 05/29/23 18:18  Glucose-Capillary 70 - 99 mg/dL 161 (H) 096 (H)    Latest Reference Range & Units 05/29/23 13:59 05/30/23 06:24  Glucose 70 - 99 mg/dL 045 (H) 409 (H)   Review of Glycemic Control  Diabetes history: DM2 Outpatient Diabetes medications: Lantus 10 units QPM, Metformin 1000 mg BID, Jardiance 10 mg daily,  Current orders for Inpatient glycemic control: Novolog 0-15 units TID with   Inpatient Diabetes Program Recommendations:    Insulin: Please consider ordering Semglee 8 units Q24H and adding Novolog 0-5 units at bedtime.  Diet: If appropriate, consider discontinue Regular diet and ordering Carb Modified diet.  Thanks, Orlando Penner, RN, MSN, CDCES Diabetes Coordinator Inpatient Diabetes Program [phone] (Team Pager from 8am to 5pm)

## 2023-05-30 NOTE — TOC Initial Note (Signed)
 Transition of Care [facility]) - Initial/Assessment Note    Patient Details  Name: Matthew Mcgrath MRN: [id number] Date of Birth: 1945-09-23  Transition of Care [facility]) CM/SW Contact:    Colin Broach, LCSW Phone Number: 05/30/2023, 5:51 PM  Clinical Narrative:                 This CSW met with patient at bedside.  CSW introduced self and reason for visit.  Demographic and insurance information was verified. Two of his sisters were present [name], [phone] and [place], [phone]).  Patient was eating his meal and sisters were gracious to help answer readmit prevention screening question with patient's permission.  Patient is currently a LTC resident at [facility].  His PCP is [facility] and he is with [facility].  They provide his medications.  Patient has the following DME:  Wheelchair.  At discharge, sisters state that [name] will transport patient back to the facility.  TOC to continue to follow for any discharge needs that may arise.    Expected Discharge Plan: Long Term Acute Care (LTAC) Barriers to Discharge: Continued Medical Work up   Patient Goals and CMS Choice            Expected Discharge Plan and Services       Living arrangements for the past 2 months: Skilled Nursing Facility                                      Prior Living Arrangements/Services Living arrangements for the past 2 months: Skilled Nursing Facility Lives with:: Facility Resident Patient language and need for interpreter reviewed:: Yes Do you feel safe going back to the place where you live?: Yes      Need for Family Participation in Patient Care: Yes (Comment) Care giver support system in place?: Yes (comment) Current home services: DME Criminal Activity/Legal Involvement Pertinent to Current Situation/Hospitalization: No - Comment as needed  Activities of Daily Living   ADL Screening (condition at time of admission) Independently performs ADLs?: No Does the patient have  a NEW difficulty with bathing/dressing/toileting/self-feeding that is expected to last >3 days?: No Does the patient have a NEW difficulty with getting in/out of bed, walking, or climbing stairs that is expected to last >3 days?: No Does the patient have a NEW difficulty with communication that is expected to last >3 days?: No Is the patient deaf or have difficulty hearing?: No Does the patient have difficulty seeing, even when wearing glasses/contacts?: No Does the patient have difficulty concentrating, remembering, or making decisions?: No  Permission Sought/Granted                  Emotional Assessment Appearance:: Appears stated age Attitude/Demeanor/Rapport: Gracious Affect (typically observed): Appropriate Orientation: : Oriented to Self, Oriented to Place, Oriented to  Time, Oriented to Situation Alcohol / Substance Use: Not Applicable Psych Involvement: No (comment)  Admission diagnosis:  Lower urinary tract infectious disease [N39.0] Sepsis (HCC) [A41.9] Altered mental status, unspecified altered mental status type [R41.82] Patient Active Problem List   Diagnosis Date Noted   Alzheimer's dementia (HCC) 05/29/2023   Delirium 05/29/2023   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   DVT (deep venous thrombosis) (HCC) 04/08/2023   Acute metabolic encephalopathy 04/08/2023   Pulmonary embolism (HCC) 04/07/2023   Hypomagnesemia 04/07/2023   Elevated lactic acid level 04/07/2023  Myocardial injury 04/07/2023   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   Stroke (HCC) 11/26/2022   Acute encephalopathy 11/26/2022   Lower extremity weakness 06/28/2022   Cognitive impairment 05/17/2022   Anemia    Sepsis (HCC) 06/19/2021   Gross hematuria 06/19/2021   Complicated UTI (urinary tract infection) 06/18/2021   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   Genetic testing 02/22/2021   Family history of colon cancer 01/13/2021   Family history  of breast cancer 01/13/2021   Encounter for antineoplastic chemotherapy 07/06/2020   Metastatic cancer to bone (HCC) 07/06/2020   Metastasis to bone (HCC) 05/28/2020   Prostate cancer (HCC) 05/11/2020   Goals of care, counseling/discussion 05/11/2020   Iliac artery aneurysm (HCC) 04/12/2016   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   Cardiomyopathy (HCC) 04/24/2015   S/P cardiac catheterization 04/24/2015   Chest pain 12/23/2014   Diabetes (HCC) 11/21/2014   Chronic systolic heart failure (HCC) 11/20/2014   Tobacco abuse 11/20/2014   Pedal edema 11/03/2014   Acute on chronic combined systolic and diastolic congestive heart failure (HCC) 09/20/2014   Elevation of level of transaminase and lactic acid dehydrogenase (LDH) 09/20/2014   Essential (primary) hypertension 05/02/2014   Hyperlipidemia 05/02/2014   Shortness of breath 05/02/2014   Urinary tract infection 05/02/2014   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   PCP:  [facility] Pharmacy:   [facility] - [place], [place] - [address] [place] [address] Phone: [phone] Fax: [phone]     Social Drivers of Health (SDOH) Social History: SDOH Screenings   Food Insecurity: No Food Insecurity (05/30/2023)  Housing: Low Risk  (05/30/2023)  Transportation Needs: No Transportation Needs (05/30/2023)  Utilities: Not At Risk (05/30/2023)  Social Connections: Unknown (05/30/2023)  Tobacco Use: Medium Risk (05/30/2023)   SDOH Interventions:     Readmission Risk Interventions    05/30/2023    5:49 PM 06/21/2021   11:22 AM  Readmission Risk Prevention Plan  Transportation Screening Complete Complete  PCP or Specialist Appt within 5-7 Days  Complete  Home Care Screening  Complete  Medication Review (RN CM)  Complete  Medication Review (RN Care Manager) Complete   PCP or Specialist appointment within 3-5 days of discharge Complete    HRI or Home Care Consult Complete   SW Recovery Care/Counseling Consult Complete   Palliative Care Screening Not Applicable   Skilled Nursing Facility Not Applicable

## 2023-05-30 NOTE — Consult Note (Signed)
 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  Cover with dry gauze and wrap in silicone foam or Kerlix roll gauze whichever preferred. Bilateral feet should be placed in Prevalon boots to offload pressure [place] [phone])   POC discussed with bedside nurse WOC team will not follow. Patient should continue to follow with vascular surgeon as outpatient.    Thank you,    Priscella Mann MSN, RN-BC, [employer] [phone]

## 2023-05-30 NOTE — ED Notes (Signed)
 Family sitting at bedside. Pt up talking with family, no distress noted, no needs at this time.

## 2023-05-30 NOTE — ED Notes (Signed)
 Pt family asking for help sliding pt up in bed and to also place on bedpan for BM. Pt slid up in bed,purewick noticed to be off pt, BM already done. Pt cleaned of stool, new purewick, brief, chucks pad and gown placed on pt.

## 2023-05-30 NOTE — ED Notes (Signed)
 Pt calling out asking for cup of water. Water given.

## 2023-05-30 NOTE — ED Notes (Signed)
 Pt not speaking with this RN. Does seem he understands and follows directions well.

## 2023-05-30 NOTE — Progress Notes (Signed)
 Pharmacy - Antimicrobial Stewardship  Outpatient Urine culture result - collected 05/25/2023

## 2023-05-30 NOTE — Progress Notes (Signed)
 Progress Note   Patient: Matthew Mcgrath ION:[id number] DOB: 07-26-1945 DOA: 05/29/2023     1 DOS: the patient was seen and examined on 05/30/2023   Brief hospital course: CHEIKH BRAMBLE is a 78 y.o. male with medical history significant of hypertension, hyperlipidemia, type 2 diabetes, HFrEF with recovered EF (last 40-45%) s/p AICD, prostate cancer with metastasis to the bone on Olarparnib, Alzheimer's dementia, pulmonary embolism on Eliquis who presents to the ED due to altered mental status.   Patient had recent UTI, he was on oral antibiotic which did not cover ESBL E. Coli(Per outpatient scanned document).  Patient is started on ertapenem, admitted to hospitalist service for further management.  Assessment and Plan: * Sepsis (HCC) ESBL E. coli UTI Patient has tachycardic, tachypneic in the setting of ESBL E. coli UTI.  Lactic acid is elevated. Patient will be monitored on telemetry unit Continue gentle IV fluids. Trend lactic acid level. Urine culture and blood cultures pending. Continue meropenem per pharmacy dosing  Acute metabolic encephalopathy With increasingly lethargic and confused compared to baseline. Patient does have baseline dementia. Continue to monitor neurochecks. Delirium precautions  Chronic anemia Hemoglobin stable around 8.4. No active bleeding at this time. Continue to monitor daily CBC.  Chronic systolic heart failure (HCC) EF has recovered, last checked in February 2025, at which time it was 40-45%.  Patient is euvolemic. Will hold home diuretics Monitor Daily weights, strict in and out  Essential (primary) hypertension BP lower side. Continue to hold home antihypertensives. Will resume once blood pressure improves.  Pulmonary embolism (HCC) Recent admission for PE, on Eliquis.  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  Alkaline phosphatase today is elevated compared to prior  concerning for continued progression of bony mets. Hold home Olarparib  Alzheimer's dementia [facility]) Per chart review, patient has a long-term history of cognitive decline, with recent neurology consultation in January at which time it was felt to be most consistent with Alzheimer's dementia. Continue home regimen  Diabetes (HCC) A1c last month 8.8. Continue Accu-Cheks, sliding scale insulin. Hypoglycemia protocol.      Out of bed to chair. Incentive spirometry. Nursing supportive care. Fall, aspiration precautions. Diet:  Diet Orders (From admission, onward)     Start     Ordered   05/29/23 1749  Diet regular Room service appropriate? Yes; Fluid consistency: Thin  Diet effective now       Question Answer Comment  Room service appropriate? Yes   Fluid consistency: Thin      05/29/23 1749           DVT prophylaxis:  apixaban (ELIQUIS) tablet 5 mg  Level of care: Telemetry medical   Code Status: Full Code  Subjective: Patient is seen and examined today morning in the emergency department.  He is responding slowly but appropriate.  Denies any complaints of abdominal pain or nausea vomiting.  He is trying to eat his breakfast.  Physical Exam: Vitals:   05/30/23 0750 05/30/23 1000 05/30/23 1100 05/30/23 1324  BP:  (!) 135/92  115/66  Pulse:  (!) 106 (!) 112 (!) 101  Resp:  (!) 23 20 18   Temp: 98.8 F (37.1 C)   98.3 F (36.8 C)  TempSrc: Oral   Oral  SpO2:  98% 99% 98%  Height:  5\' 5"  (1.651 m)      General - Elderly African-American male, no apparent distress HEENT - PERRLA, EOMI, atraumatic head, non tender  sinuses. Lung - Clear, basal rales, rhonchi, no wheezes. Heart - S1, S2 heard, no murmurs, rubs, trace pedal edema. Abdomen - Soft, non tender, bowel sounds good Neuro - Alert, awake, able to follow commands, non focal exam. Skin - Warm and dry.  Data Reviewed:      Latest Ref Rng & Units 05/30/2023    6:24 AM 05/29/2023    1:59 PM 04/09/2023    5:19  AM  CBC  WBC 4.0 - 10.5 K/uL 4.1  4.7  3.9   Hemoglobin 13.0 - 17.0 g/dL 8.4  8.3  9.6   Hematocrit 39.0 - 52.0 % 26.5  26.5  30.0   Platelets 150 - 400 K/uL 253  269  168       Latest Ref Rng & Units 05/30/2023    6:24 AM 05/29/2023    1:59 PM 04/09/2023    5:19 AM  BMP  Glucose 70 - 99 mg/dL 696  295  284   BUN 8 - 23 mg/dL 21  29  21    Creatinine 0.61 - 1.24 mg/dL 1.32  4.40  1.02   Sodium 135 - 145 mmol/L 139  134  140   Potassium 3.5 - 5.1 mmol/L 3.8  4.4  3.5   Chloride 98 - 111 mmol/L 104  101  105   CO2 22 - 32 mmol/L 24  24  23    Calcium 8.9 - 10.3 mg/dL 8.7  9.0  8.6    CT HEAD WO CONTRAST ( ) Result Date: 05/29/2023 CLINICAL DATA:  altered, on eliquis, eval ICH EXAM: CT HEAD WITHOUT CONTRAST TECHNIQUE: Contiguous axial images were obtained from the base of the skull through the vertex without intravenous contrast. RADIATION DOSE REDUCTION: This exam was performed according to the departmental dose-optimization program which includes automated exposure control, adjustment of the mA and/or kV according to patient size and/or use of iterative reconstruction technique. COMPARISON:  CT head 04/07/2023 FINDINGS: Brain: Cerebral ventricle sizes are concordant with the degree of cerebral volume loss. Patchy and confluent areas of decreased attenuation are noted throughout the deep and periventricular white matter of the cerebral hemispheres bilaterally, compatible with chronic microvascular ischemic disease. No evidence of large-territorial acute infarction. No parenchymal hemorrhage. No mass lesion. No extra-axial collection. No mass effect or midline shift. No hydrocephalus. Basilar cisterns are patent. Vascular: No hyperdense vessel. Atherosclerotic calcifications are present within the cavernous internal carotid and vertebral arteries. Skull: No acute fracture or focal lesion. Sinuses/Orbits: Paranasal sinuses and mastoid air cells are clear. The orbits are unremarkable. Other: None.  IMPRESSION: No acute intracranial abnormality. Electronically Signed   By: Tish Frederickson M.D.   On: 05/29/2023 18:56   DG Chest Port 1 View Result Date: 05/29/2023 CLINICAL DATA:  Questionable sepsis EXAM: PORTABLE CHEST 1 VIEW COMPARISON:  Chest x-ray 04/07/2023 FINDINGS: Left-sided pacemaker is present. The heart is enlarged. There is no focal lung infiltrate, pleural effusion or pneumothorax. No acute fractures are seen. IMPRESSION: Cardiomegaly. No active disease. Electronically Signed   By: Darliss Cheney M.D.   On: 05/29/2023 15:59    Family Communication: Discussed with patient, he understand and agree. All questions answered.  Disposition: Status is: Inpatient Remains inpatient appropriate because: ESBL E. coli, IV antibiotics  Planned Discharge Destination:  Group home     Time spent: 42 minutes  Author: Marcelino Duster, MD 05/30/2023 3:28 PM Secure chat 7am to 7pm For on call review [email or web address].

## 2023-05-31 DIAGNOSIS — A419 Sepsis, unspecified organism: Secondary | ICD-10-CM | POA: Diagnosis not present

## 2023-05-31 DIAGNOSIS — A498 Other bacterial infections of unspecified site: Secondary | ICD-10-CM | POA: Diagnosis not present

## 2023-05-31 DIAGNOSIS — I5022 Chronic systolic (congestive) heart failure: Secondary | ICD-10-CM | POA: Diagnosis not present

## 2023-05-31 DIAGNOSIS — R410 Disorientation, unspecified: Secondary | ICD-10-CM | POA: Diagnosis not present

## 2023-05-31 LAB — IRON AND TIBC
Iron: 43 ug/dL — ABNORMAL LOW (ref 45–182)
Saturation Ratios: 20 % (ref 17.9–39.5)
TIBC: 216 ug/dL — ABNORMAL LOW (ref 250–450)
UIBC: 173 ug/dL

## 2023-05-31 LAB — CBC
HCT: 24.8 % — ABNORMAL LOW (ref 39.0–52.0)
Hemoglobin: 7.6 g/dL — ABNORMAL LOW (ref 13.0–17.0)
MCH: 31 pg (ref 26.0–34.0)
MCHC: 30.6 g/dL (ref 30.0–36.0)
MCV: 101.2 fL — ABNORMAL HIGH (ref 80.0–100.0)
Platelets: 278 10*3/uL (ref 150–400)
RBC: 2.45 MIL/uL — ABNORMAL LOW (ref 4.22–5.81)
RDW: 17.4 % — ABNORMAL HIGH (ref 11.5–15.5)
WBC: 4.3 10*3/uL (ref 4.0–10.5)
nRBC: 1.9 % — ABNORMAL HIGH (ref 0.0–0.2)

## 2023-05-31 LAB — URINE CULTURE: Culture: >=100000 — AB

## 2023-05-31 LAB — GLUCOSE, CAPILLARY
Glucose-Capillary: 202 mg/dL — ABNORMAL HIGH (ref 70–99)
Glucose-Capillary: 236 mg/dL — ABNORMAL HIGH (ref 70–99)
Glucose-Capillary: 233 mg/dL — ABNORMAL HIGH (ref 70–99)
Glucose-Capillary: 364 mg/dL — ABNORMAL HIGH (ref 70–99)

## 2023-05-31 LAB — FOLATE: Folate: 17.4 ng/mL (ref >5.9)

## 2023-05-31 LAB — VITAMIN B12: Vitamin B-12: 433 pg/mL (ref 180–914)

## 2023-05-31 LAB — FERRITIN: Ferritin: 357 ng/mL — ABNORMAL HIGH (ref 24–336)

## 2023-05-31 NOTE — Inpatient Diabetes Management (Signed)
 Inpatient Diabetes Program Recommendations  AACE/ADA: New Consensus Statement on Inpatient Glycemic Control   Target Ranges:  Prepandial:   less than 140 mg/dL      Peak postprandial:   less than 180 mg/dL (1-2 hours)      Critically ill patients:  140 - 180 mg/dL    Latest Reference Range & Units 05/30/23 07:49 05/30/23 11:18 05/30/23 16:16 05/30/23 20:02 05/31/23 07:55  Glucose-Capillary 70 - 99 mg/dL 161 (H) 096 (H) 045 (H) 336 (H) 202 (H)   Review of Glycemic Control  Diabetes history: DM2 Outpatient Diabetes medications: Lantus 10 units QPM, Metformin 1000 mg BID, Jardiance 10 mg daily,  Current orders for Inpatient glycemic control: Semglee 8 units daily, Novolog 0-15 units TID with    Inpatient Diabetes Program Recommendations:     Insulin: Please consider increasing Semglee to 11 units at bedtime and ordering Novolog 6 units TID with meals for meal coverage if patient eats at least 50% of meals.  Thanks, Orlando Penner, RN, MSN, CDCES Diabetes Coordinator Inpatient Diabetes Program [phone] (Team Pager from 8am to 5pm)

## 2023-05-31 NOTE — Progress Notes (Signed)
 Progress Note   Patient: Matthew Mcgrath:[id number] DOB: 08/18/1945 DOA: 05/29/2023     2 DOS: the patient was seen and examined on 05/31/2023   Brief hospital course: VELMA HANNA is a 78 y.o. male with medical history significant of hypertension, hyperlipidemia, type 2 diabetes, HFrEF with recovered EF (last 40-45%) s/p AICD, prostate cancer with metastasis to the bone on Olarparnib, Alzheimer's dementia, pulmonary embolism on Eliquis who presents to the ED due to altered mental status.   Patient had recent UTI, he was on oral antibiotic which did not cover ESBL E. Coli(Per outpatient scanned document).  Patient is started on ertapenem, admitted to hospitalist service for further management.  Assessment and Plan: * Sepsis (HCC) ESBL E. coli UTI Patient has tachycardic, tachypneic in the setting of ESBL E. coli UTI.  Lactic acid is elevated. Will repeat it. Patient will be monitored on telemetry unit Urine culture grew ESBL ecoli, continue contact precautions. Continue meropenem per pharmacy dosing. Discussed with Pace MD regarding his current care.  Acute metabolic encephalopathy With increasingly lethargic and confused compared to baseline. Patient does have baseline dementia. Continue to monitor neurochecks. Delirium precautions  Acute on chronic anemia Hemoglobin dropped to 7.6 from baseline around 8.4. No active bleeding at this time. Continue to monitor daily CBC. Iron panel, b12, folate levels ordered.  Chronic systolic heart failure (HCC) EF has recovered, last checked in February 2025, at which time it was 40-45%.  Patient is euvolemic. Will hold home diuretics Monitor Daily weights, strict in and out  Essential (primary) hypertension BP lower side. Continue to hold home antihypertensives. Will resume once blood pressure improves.  Pulmonary embolism (HCC) Recent admission for PE, on Eliquis.  Continue home Eliquis  Prostate cancer (HCC) History of advanced  stage IVB prostate cancer with bone metastasis.  Alkaline phosphatase today is elevated compared to prior concerning for continued progression of bony mets. Hold home Olarparib  Alzheimer's dementia [facility]) Per chart review, patient has a long-term history of cognitive decline, with recent neurology consultation in January at which time it was felt to be most consistent with Alzheimer's dementia. Continue home regimen  Diabetes (HCC) A1c last month 8.8. Continue Accu-Cheks, sliding scale insulin. Hypoglycemia protocol.     Continue wound care. Out of bed to chair. Incentive spirometry. Nursing supportive care. Fall, aspiration precautions. Diet:  Diet Orders (From admission, onward)     Start     Ordered   05/29/23 1749  Diet regular Room service appropriate? Yes; Fluid consistency: Thin  Diet effective now       Question Answer Comment  Room service appropriate? Yes   Fluid consistency: Thin      05/29/23 1749           DVT prophylaxis:  apixaban (ELIQUIS) tablet 5 mg  Level of care: Telemetry medical   Code Status: Full Code  Subjective: Patient is seen and examined today morning. He is alert, able to respond slowly.  Denies any complaints of abdominal pain or nausea vomiting. Sister at bedside.  Physical Exam: Vitals:   05/30/23 2000 05/31/23 0047 05/31/23 0419 05/31/23 1145  BP: 121/74 124/82 131/83 128/86  Pulse: (!) 102 (!) 101 100 95  Resp: 20 17 18 18   Temp:  98.6 F (37 C) 98.2 F (36.8 C) 97.7 F (36.5 C)  TempSrc:  Oral    SpO2: 96% 100% 96% (!) 85%  Height:        General - Elderly African-American male, no apparent distress  HEENT - PERRLA, EOMI, atraumatic head, non tender sinuses. Lung - Clear, basal rales, rhonchi, no wheezes. Heart - S1, S2 heard, no murmurs, rubs, trace pedal edema. Abdomen - Soft, non tender, bowel sounds good Neuro - Alert, awake, able to follow commands, non focal exam. Skin - Warm and dry.  Data Reviewed:       Latest Ref Rng & Units 05/31/2023    5:10 AM 05/30/2023    6:24 AM 05/29/2023    1:59 PM  CBC  WBC 4.0 - 10.5 K/uL 4.3  4.1  4.7   Hemoglobin 13.0 - 17.0 g/dL 7.6  8.4  8.3   Hematocrit 39.0 - 52.0 % 24.8  26.5  26.5   Platelets 150 - 400 K/uL 278  253  269       Latest Ref Rng & Units 05/30/2023    6:24 AM 05/29/2023    1:59 PM 04/09/2023    5:19 AM  BMP  Glucose 70 - 99 mg/dL 161  096  045   BUN 8 - 23 mg/dL 21  29  21    Creatinine 0.61 - 1.24 mg/dL 4.09  8.11  9.14   Sodium 135 - 145 mmol/L 139  134  140   Potassium 3.5 - 5.1 mmol/L 3.8  4.4  3.5   Chloride 98 - 111 mmol/L 104  101  105   CO2 22 - 32 mmol/L 24  24  23    Calcium 8.9 - 10.3 mg/dL 8.7  9.0  8.6    CT HEAD WO CONTRAST ( ) Result Date: 05/29/2023 CLINICAL DATA:  altered, on eliquis, eval ICH EXAM: CT HEAD WITHOUT CONTRAST TECHNIQUE: Contiguous axial images were obtained from the base of the skull through the vertex without intravenous contrast. RADIATION DOSE REDUCTION: This exam was performed according to the departmental dose-optimization program which includes automated exposure control, adjustment of the mA and/or kV according to patient size and/or use of iterative reconstruction technique. COMPARISON:  CT head 04/07/2023 FINDINGS: Brain: Cerebral ventricle sizes are concordant with the degree of cerebral volume loss. Patchy and confluent areas of decreased attenuation are noted throughout the deep and periventricular white matter of the cerebral hemispheres bilaterally, compatible with chronic microvascular ischemic disease. No evidence of large-territorial acute infarction. No parenchymal hemorrhage. No mass lesion. No extra-axial collection. No mass effect or midline shift. No hydrocephalus. Basilar cisterns are patent. Vascular: No hyperdense vessel. Atherosclerotic calcifications are present within the cavernous internal carotid and vertebral arteries. Skull: No acute fracture or focal lesion. Sinuses/Orbits: Paranasal  sinuses and mastoid air cells are clear. The orbits are unremarkable. Other: None. IMPRESSION: No acute intracranial abnormality. Electronically Signed   By: Tish Frederickson M.D.   On: 05/29/2023 18:56   DG Chest Port 1 View Result Date: 05/29/2023 CLINICAL DATA:  Questionable sepsis EXAM: PORTABLE CHEST 1 VIEW COMPARISON:  Chest x-ray 04/07/2023 FINDINGS: Left-sided pacemaker is present. The heart is enlarged. There is no focal lung infiltrate, pleural effusion or pneumothorax. No acute fractures are seen. IMPRESSION: Cardiomegaly. No active disease. Electronically Signed   By: Darliss Cheney M.D.   On: 05/29/2023 15:59    Family Communication: Discussed with patient, he understand and agree. All questions answered.  Disposition: Status is: Inpatient Remains inpatient appropriate because: ESBL E. coli, IV antibiotics  Planned Discharge Destination:  Group home     Time spent: 40 minutes  Author: Marcelino Duster, MD 05/31/2023 1:48 PM Secure chat 7am to 7pm For on call review [email or web address].

## 2023-06-01 DIAGNOSIS — E1165 Type 2 diabetes mellitus with hyperglycemia: Secondary | ICD-10-CM

## 2023-06-01 DIAGNOSIS — A419 Sepsis, unspecified organism: Secondary | ICD-10-CM | POA: Diagnosis not present

## 2023-06-01 DIAGNOSIS — A498 Other bacterial infections of unspecified site: Secondary | ICD-10-CM | POA: Insufficient documentation

## 2023-06-01 DIAGNOSIS — R410 Disorientation, unspecified: Secondary | ICD-10-CM | POA: Diagnosis not present

## 2023-06-01 DIAGNOSIS — Z794 Long term (current) use of insulin: Secondary | ICD-10-CM

## 2023-06-01 DIAGNOSIS — I5022 Chronic systolic (congestive) heart failure: Secondary | ICD-10-CM | POA: Diagnosis not present

## 2023-06-01 LAB — CBC
HCT: 28.8 % — ABNORMAL LOW (ref 39.0–52.0)
Hemoglobin: 9 g/dL — ABNORMAL LOW (ref 13.0–17.0)
MCH: 31.3 pg (ref 26.0–34.0)
MCHC: 31.3 g/dL (ref 30.0–36.0)
MCV: 100 fL (ref 80.0–100.0)
Platelets: 296 10*3/uL (ref 150–400)
RBC: 2.88 MIL/uL — ABNORMAL LOW (ref 4.22–5.81)
RDW: 17.2 % — ABNORMAL HIGH (ref 11.5–15.5)
WBC: 4.2 10*3/uL (ref 4.0–10.5)
nRBC: 1.4 % — ABNORMAL HIGH (ref 0.0–0.2)

## 2023-06-01 LAB — GLUCOSE, CAPILLARY
Glucose-Capillary: 228 mg/dL — ABNORMAL HIGH (ref 70–99)
Glucose-Capillary: 223 mg/dL — ABNORMAL HIGH (ref 70–99)

## 2023-06-01 LAB — LACTIC ACID, PLASMA: Lactic Acid, Venous: 1.5 mmol/L (ref 0.5–1.9)

## 2023-06-01 MED ORDER — NITROFURANTOIN MONOHYD MACRO 100 MG PO CAPS: 100.0000 mg | ORAL_CAPSULE | Freq: Two times a day (BID) | ORAL | 0 refills | Status: AC

## 2023-06-01 MED ORDER — FERROUS SULFATE 325 (65 FE) MG PO TBEC: 325.0000 mg | DELAYED_RELEASE_TABLET | Freq: Every day | ORAL | 1 refills | Status: DC

## 2023-06-01 MED ORDER — NITROFURANTOIN MONOHYD MACRO 100 MG PO CAPS
100.0000 mg | ORAL_CAPSULE | Freq: Two times a day (BID) | ORAL | Status: DC
  Filled 2023-06-01: qty 1

## 2023-06-01 MED ORDER — NITROFURANTOIN MONOHYD MACRO 100 MG PO CAPS
100.0000 mg | ORAL_CAPSULE | Freq: Two times a day (BID) | ORAL | Status: DC
  Administered 2023-06-01: 100 mg via ORAL
  Filled 2023-06-01: qty 1

## 2023-06-01 NOTE — Inpatient Diabetes Management (Signed)
 Inpatient Diabetes Program Recommendations  AACE/ADA: New Consensus Statement on Inpatient Glycemic Control (2015)  Target Ranges:  Prepandial:   less than 140 mg/dL      Peak postprandial:   less than 180 mg/dL (1-2 hours)      Critically ill patients:  140 - 180 mg/dL    Latest Reference Range & Units 05/31/23 07:55 05/31/23 11:43 05/31/23 18:55 05/31/23 20:56  Glucose-Capillary 70 - 99 mg/dL 784 (H)  5 units Novolog  236 (H)  5 units Novolog  233 (H) 364 (H)  5 units Novolog  8 units Semglee   (H): Data is abnormally high  Latest Reference Range & Units 06/01/23 07:37  Glucose-Capillary 70 - 99 mg/dL 696 (H)  (H): Data is abnormally high      Home DM Meds: Lantus 10 units QPM Metformin 1000 mg BID Jardiance 10 mg daily   Current Orders: Semglee 8 units at HS Novolog 0-15 units TID + HS   MD- Please consider:  1. Increase Semglee to 12 units at bedtime  2. Start Novolog Meal Coverage: Novolog 4 units TID with meals HOLD if pt NPO HOLD if pt eats <50% meals     --Will follow patient during hospitalization--  Ambrose Finland RN, MSN, CDCES Diabetes Coordinator Inpatient Glycemic Control Team Team Pager: [phone] (8a-5p)

## 2023-06-01 NOTE — Progress Notes (Signed)
 Pt report called to [place] at [facility]  All questions answered [phone].

## 2023-06-01 NOTE — Discharge Summary (Addendum)
 Physician Discharge Summary   Patient: Matthew Mcgrath MRN: [id number] DOB: 03/20/1945  Admit date:     05/29/2023  Discharge date: 06/01/23  Discharge Physician: Marcelino Duster   PCP: [facility]   Recommendations at discharge:    PCP follow up in 1 week  Discharge Diagnoses: Principal Problem:   Sepsis (HCC) Active Problems:   Complicated UTI (urinary tract infection)   Delirium   Anemia   Chronic systolic heart failure (HCC)   Essential (primary) hypertension   Pulmonary embolism (HCC)   Prostate cancer (HCC)   Alzheimer's dementia (HCC)   Diabetes (HCC)   Infection due to ESBL-producing Escherichia coli  Resolved Problems:   * No resolved hospital problems. *  Hospital Course: Matthew Mcgrath is a 78 y.o. male with medical history significant of hypertension, hyperlipidemia, type 2 diabetes, HFrEF with recovered EF (last 40-45%) s/p AICD, prostate cancer with metastasis to the bone on Olarparnib, Alzheimer's dementia, pulmonary embolism on Eliquis who presents to the ED due to altered mental status.    Patient had recent UTI, he was on oral antibiotic which did not cover ESBL E. Coli(Per outpatient scanned document).  Patient is started on ertapenem, admitted to hospitalist service for further management.  Assessment and Plan: * Severe Sepsis (HCC) ESBL E. coli UTI Patient has tachycardic, tachypneic, with lactic acidosis in the setting of ESBL E. coli UTI.  Lactic acid is trended down. Patient remained afebrile, vitals stable. Urine culture grew ESBL ecoli, continue contact precautions. Meropenem changed to nitrofurantoin 100mg  bid for 10 more days. Discussed with Pace MD regarding his discharge plan.   Acute metabolic encephalopathy Mental status much improved. Slow but able to answer me. Patient does have baseline dementia. Delirium precautions   Acute on chronic anemia Hemoglobin stable at 8-9. No active bleeding at this time. Continue  to monitor daily CBC. Iron level low, supplements ordered.   Chronic systolic heart failure (HCC) EF has recovered, last checked in February 2025, at which time it was 40-45%.  Patient is euvolemic. Continue home dose diuretics Monitor weights, in and output.   Essential (primary) hypertension BP lower improved. Resumed home antihypertensives.   Pulmonary embolism (HCC) Recent admission for PE, on Eliquis.  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  Alkaline phosphatase today is elevated compared to prior concerning for continued progression of bony mets. Hold home Olarparib   Alzheimer's dementia [facility]) Per chart review, patient has a long-term history of cognitive decline, with recent neurology consultation in January at which time it was felt to be most consistent with Alzheimer's dementia. Continue supportive care.   Diabetes (HCC) A1c last month 8.8. Continue insulin, metformin home dose upon discharge. Hypoglycemia protocol.         Consultants: none Procedures performed: none  Disposition: Group home Diet recommendation:  Discharge Diet Orders (From admission, onward)     Start     Ordered   06/01/23 0000  Diet - low sodium heart healthy        06/01/23 1149           Cardiac and Carb modified diet DISCHARGE MEDICATION: Allergies as of 06/01/2023   No Known Allergies      Medication List     STOP taking these medications    amoxicillin-clavulanate 875-125 MG tablet Commonly known as: AUGMENTIN       TAKE these medications    acetaminophen 325 MG tablet  Commonly known as: TYLENOL Take 2 tablets (650 mg total) by mouth every 4 (four) hours as needed for mild pain (or temp > 37.5 C (99.5 F)). What changed: when to take this   apixaban 5 MG Tabs tablet Commonly known as: ELIQUIS Take 10 mg twice daily for 5 days followed by 5 mg twice daily.  Continue Eliquis until further  notice from your primary care physician or oncologist. What changed:  how much to take how to take this when to take this additional instructions   Crista Elliot Ultra Strength 06-14-28 % Crea Generic drug: Camphor-Menthol-Methyl Sal Apply 1 Application topically 3 (three) times daily. Apply to lower back   carvedilol 3.125 MG tablet Commonly known as: COREG Take 3.125 mg by mouth 2 (two) times daily.   cholecalciferol 25 MCG (1000 UNIT) tablet Commonly known as: VITAMIN D3 Take 1,000 Units by mouth daily.   Desitin 13 % Crea Generic drug: Zinc Oxide Apply 1 application  topically in the morning, at noon, in the evening, and at bedtime. Apply liberally to diaper/gluteal area   ferrous sulfate 325 (65 FE) MG EC tablet Take 1 tablet (325 mg total) by mouth daily.   Fish Oil 1000 MG Caps Take 1 capsule by mouth 2 (two) times daily.   furosemide 40 MG tablet Commonly known as: LASIX Take 40 mg by mouth every Monday, Tuesday, Wednesday, Thursday, and Friday.   gabapentin 100 MG capsule Commonly known as: NEURONTIN Take 300 mg by mouth 2 (two) times daily.   metFORMIN 500 MG 24 hr tablet Commonly known as: GLUCOPHAGE-XR Take 1,000 mg by mouth 2 (two) times daily.   Glumetza 1000 MG 24 hr tablet Generic drug: metFORMIN Take 1,000 mg by mouth 2 (two) times daily with a meal.   insulin glargine 100 UNIT/ML injection Commonly known as: LANTUS Inject 10 Units into the skin every evening.   Jardiance 10 MG Tabs tablet Generic drug: empagliflozin Take 10 mg by mouth daily.   leuprolide acetate (6 Month) 45 MG injection Generic drug: leuprolide (6 Month) Inject into the skin.   Lynparza 150 MG tablet Generic drug: olaparib Take 150 mg by mouth 2 (two) times daily.   Lynparza 100 MG tablet Generic drug: olaparib Take 100 mg by mouth 2 (two) times daily.   magnesium oxide 400 (240 Mg) MG tablet Commonly known as: MAG-OX Take 1 tablet by mouth daily.   nitrofurantoin  (macrocrystal-monohydrate) 100 MG capsule Commonly known as: MACROBID Take 1 capsule (100 mg total) by mouth every 12 (twelve) hours for 10 days.   omeprazole 10 MG capsule Commonly known as: PRILOSEC Take 10 mg by mouth daily.   One-Daily Multi-Vitamin Tabs Take 1 tablet by mouth daily.   Pro-Stat AWC Liqd Take 30 mLs by mouth 2 (two) times daily.   senna 8.6 MG Tabs tablet Commonly known as: SENOKOT Take 2 tablets by mouth at bedtime.   spironolactone 25 MG tablet Commonly known as: ALDACTONE Take 0.5 tablets (12.5 mg total) by mouth daily.   tamsulosin 0.4 MG Caps capsule Commonly known as: FLOMAX Take 1 capsule (0.4 mg total) by mouth daily. What changed: when to take this               Discharge Care Instructions  (From admission, onward)           Start     Ordered   06/01/23 0000  Discharge wound care:       Comments: Cleanse B heels and R  5th digit with soap and water, dry and apply Xeroform gauze to all areas of black eschar daily.  Cover with dry gauze and wrap in silicone foam or Kerlix roll gauze whichever preferred. Bilateral feet should be placed in Prevalon boots to offload pressure [facility] [place] [phone])   06/01/23 1149            Follow-up Information     [facility] Follow up.   Why: Hospital follow up Contact information: [address] [place] [address] [phone]                Discharge Exam: Ceasar Mons Weights   06/01/23 0500  Weight: 82.5 kg      06/01/2023    7:36 AM 06/01/2023    5:00 AM 06/01/2023    4:32 AM  Vitals with BMI  Weight  181 lbs 14 oz   BMI  30.27   Systolic 117  120  Diastolic 62  62  Pulse 105  99    General - Elderly African-American male, no apparent distress HEENT - PERRLA, EOMI, atraumatic head, non tender sinuses. Lung - Clear, basal rales, rhonchi, no wheezes. Heart - S1, S2 heard, no murmurs, rubs, trace pedal edema. Abdomen - Soft, non tender, bowel sounds good Neuro -  Alert, awake, able to follow commands, non focal exam. Skin - Warm and dry.  Condition at discharge: stable  The results of significant diagnostics from this hospitalization (including imaging, microbiology, ancillary and laboratory) are listed below for reference.   Imaging Studies: CT HEAD WO CONTRAST ( ) Result Date: 05/29/2023 CLINICAL DATA:  altered, on eliquis, eval ICH EXAM: CT HEAD WITHOUT CONTRAST TECHNIQUE: Contiguous axial images were obtained from the base of the skull through the vertex without intravenous contrast. RADIATION DOSE REDUCTION: This exam was performed according to the departmental dose-optimization program which includes automated exposure control, adjustment of the mA and/or kV according to patient size and/or use of iterative reconstruction technique. COMPARISON:  CT head 04/07/2023 FINDINGS: Brain: Cerebral ventricle sizes are concordant with the degree of cerebral volume loss. Patchy and confluent areas of decreased attenuation are noted throughout the deep and periventricular white matter of the cerebral hemispheres bilaterally, compatible with chronic microvascular ischemic disease. No evidence of large-territorial acute infarction. No parenchymal hemorrhage. No mass lesion. No extra-axial collection. No mass effect or midline shift. No hydrocephalus. Basilar cisterns are patent. Vascular: No hyperdense vessel. Atherosclerotic calcifications are present within the cavernous internal carotid and vertebral arteries. Skull: No acute fracture or focal lesion. Sinuses/Orbits: Paranasal sinuses and mastoid air cells are clear. The orbits are unremarkable. Other: None. IMPRESSION: No acute intracranial abnormality. Electronically Signed   By: Tish Frederickson M.D.   On: 05/29/2023 18:56   DG Chest Port 1 View Result Date: 05/29/2023 CLINICAL DATA:  Questionable sepsis EXAM: PORTABLE CHEST 1 VIEW COMPARISON:  Chest x-ray 04/07/2023 FINDINGS: Left-sided pacemaker is present. The  heart is enlarged. There is no focal lung infiltrate, pleural effusion or pneumothorax. No acute fractures are seen. IMPRESSION: Cardiomegaly. No active disease. Electronically Signed   By: Darliss Cheney M.D.   On: 05/29/2023 15:59   US ARTERIAL LOWER EXTREMITY DUPLEX BILATERAL Result Date: 05/16/2023 CLINICAL DATA:  78 year old male with lower extremity heel wounds EXAM: NONINVASIVE PHYSIOLOGIC VASCULAR STUDY OF BILATERAL LOWER EXTREMITIES TECHNIQUE: Evaluation of both lower extremities was performed at rest, including calculation of ankle-brachial indices, directed duplex COMPARISON:  None Available. FINDINGS: Right ABI:  Not acquired Left ABI:  Not acquired Right Lower  Extremity: Directed duplex right lower extremity demonstrates monophasic common femoral artery, profunda femoris, proximal superficial femoral artery. Evidence of occlusion in the mid segment of the SFA with monophasic signal distal. Monophasic popliteal artery, posterior tibial artery, anterior tibial artery. Left Lower Extremity: Directed duplex left lower extremity demonstrates triphasic common femoral artery, profunda femoris. Monophasic proximal SFA, mid SFA, distal SFA. Monophasic popliteal artery, posterior tibial artery and anterior tibial artery IMPRESSION: Right: The directed duplex demonstrates occlusive iliac arterial disease, with monophasic common femoral artery waveform. Occlusion in the mid segment of the SFA, with abnormal monophasic popliteal artery and tibial arteries. Left: Directed duplex demonstrates mild iliac arterial disease, with proximal SFA occlusion, abnormal monophasic popliteal artery and tibial arteries. Signed, Yvone Neu. Miachel Roux, RPVI [facility] Electronically Signed   By: Gilmer Mor D.O.   On: 05/16/2023 09:42    Microbiology: Results for orders placed or performed during the hospital encounter of 05/29/23  Blood Culture (routine x 2)      Status: None (Preliminary result)   Collection Time: 05/29/23  1:56 PM   Specimen: BLOOD  Result Value Ref Range Status   Specimen Description BLOOD RIGHT ANTECUBITAL  Final   Special Requests   Final    BOTTLES DRAWN AEROBIC AND ANAEROBIC Blood Culture adequate volume   Culture   Final    NO GROWTH 3 DAYS Performed at [facility], [address], [place], [place] [address]    Report Status PENDING  Incomplete  Urine Culture     Status: Abnormal   Collection Time: 05/29/23  1:56 PM   Specimen: Urine, Clean Catch  Result Value Ref Range Status   Specimen Description   Final    URINE, CLEAN CATCH Performed at [facility], [address] [place], [place] [address]    Special Requests   Final    NONE Performed at [facility], [address] [place], [place] [address]    Culture (A)  Final    >=100,000 COLONIES/mL ESCHERICHIA COLI Confirmed Extended Spectrum Beta-Lactamase Producer (ESBL).  In bloodstream infections from ESBL organisms, carbapenems are preferred over piperacillin/tazobactam. They are shown to have a lower risk of mortality.    Report Status 05/31/2023 FINAL  Final   Organism ID, Bacteria ESCHERICHIA COLI (A)  Final      Susceptibility   Escherichia coli - MIC*    AMPICILLIN >=32 RESISTANT Resistant     CEFAZOLIN >=64 RESISTANT Resistant     CEFEPIME 4 INTERMEDIATE Intermediate     CEFTRIAXONE >=64 RESISTANT Resistant     CIPROFLOXACIN 1 RESISTANT Resistant     GENTAMICIN <=1 SENSITIVE Sensitive     IMIPENEM <=0.25 SENSITIVE Sensitive     NITROFURANTOIN <=16 SENSITIVE Sensitive     TRIMETH/SULFA >=320 RESISTANT Resistant     AMPICILLIN/SULBACTAM 4 SENSITIVE Sensitive     PIP/TAZO <=4 SENSITIVE Sensitive ug/mL    * >=100,000 COLONIES/mL ESCHERICHIA COLI  Blood Culture (routine x 2)     Status: None (Preliminary result)   Collection Time: 05/29/23 10:26 PM   Specimen: BLOOD  Result Value Ref Range Status   Specimen  Description BLOOD BLOOD RIGHT HAND  Final   Special Requests   Final    BOTTLES DRAWN AEROBIC AND ANAEROBIC Blood Culture results may not be optimal due to an inadequate volume of blood received in culture bottles   Culture   Final    NO GROWTH 3 DAYS Performed at [facility], [address] [place], [place] [address]    Report Status PENDING  Incomplete  MRSA Next Gen by PCR, Nasal     Status: None   Collection Time: 05/30/23  4:09 PM   Specimen: Nasal Mucosa; Nasal Swab  Result Value Ref Range Status   MRSA by PCR Next Gen NOT DETECTED NOT DETECTED Final    Comment: (NOTE) The GeneXpert MRSA Assay (FDA approved for NASAL specimens only), is one component of a comprehensive MRSA colonization surveillance program. It is not intended to diagnose MRSA infection nor to guide or monitor treatment for MRSA infections. Test performance is not FDA approved in patients less than 38 years old. Performed at [facility], [address] [place], [place] [address]     Labs: CBC: Recent Labs  Lab 05/29/23 1359 05/30/23 0624 05/31/23 0510 06/01/23 0453  WBC 4.7 4.1 4.3 4.2  NEUTROABS 3.7  --   --   --   HGB 8.3* 8.4* 7.6* 9.0*  HCT 26.5* 26.5* 24.8* 28.8*  MCV 103.5* 100.8* 101.2* 100.0  PLT 269 253 278 296   Basic Metabolic Panel: Recent Labs  Lab 05/29/23 1359 05/30/23 0624  NA 134* 139  K 4.4 3.8  CL 101 104  CO2 24 24  GLUCOSE 429* 202*  BUN 29* 21  CREATININE 1.07 0.83  CALCIUM 9.0 8.7*   Liver Function Tests: Recent Labs  Lab 05/29/23 1359  AST 18  ALT 11  ALKPHOS 330*  BILITOT 0.7  PROT 7.1  ALBUMIN 2.9*   CBG: Recent Labs  Lab 05/31/23 0755 05/31/23 1143 05/31/23 1855 05/31/23 2056 06/01/23 0737  GLUCAP 202* 236* 233* 364* 228*    Discharge time spent: 35 minutes.  Signed: Marcelino Duster, MD [facility] 06/01/2023

## 2023-06-01 NOTE — Plan of Care (Signed)
  Problem: Coping: Goal: Ability to adjust to condition or change in health will improve Outcome: Progressing   Problem: Health Behavior/Discharge Planning: Goal: Ability to identify and utilize available resources and services will improve Outcome: Progressing   Problem: Metabolic: Goal: Ability to maintain appropriate glucose levels will improve Outcome: Progressing   Problem: Skin Integrity: Goal: Risk for impaired skin integrity will decrease Outcome: Progressing   Problem: Fluid Volume: Goal: Hemodynamic stability will improve Outcome: Progressing   Problem: Clinical Measurements: Goal: Diagnostic test results will improve Outcome: Progressing

## 2023-06-01 NOTE — TOC Initial Note (Signed)
 Transition of Care [facility]) - Initial/Assessment Note    Patient Details  Name: Matthew Mcgrath MRN: [id number] Date of Birth: April 27, 1945  [facility] CM/SW Contact:    Cherre Blanc, RN Phone Number: 06/01/2023, 12:49 PM  Clinical Narrative:                 Patient is medically clear for dc back to [facility]. TOC left VMM with patient's sister, [name] [phone], to notify her the discharge. PACE [phone] notified of dc and will provide transportation. No other dc needs identified.    Expected Discharge Plan: Long Term Acute Care (LTAC) Barriers to Discharge: No Barriers Identified   Patient Goals and CMS Choice            Expected Discharge Plan and Services       Living arrangements for the past 2 months: Skilled Nursing Facility Expected Discharge Date: 06/01/23                                    Prior Living Arrangements/Services Living arrangements for the past 2 months: Skilled Nursing Facility Lives with:: Facility Resident Patient language and need for interpreter reviewed:: Yes Do you feel safe going back to the place where you live?: Yes      Need for Family Participation in Patient Care: Yes (Comment) Care giver support system in place?: Yes (comment) Current home services: DME Criminal Activity/Legal Involvement Pertinent to Current Situation/Hospitalization: No - Comment as needed  Activities of Daily Living   ADL Screening (condition at time of admission) Independently performs ADLs?: No Does the patient have a NEW difficulty with bathing/dressing/toileting/self-feeding that is expected to last >3 days?: No Does the patient have a NEW difficulty with getting in/out of bed, walking, or climbing stairs that is expected to last >3 days?: No Does the patient have a NEW difficulty with communication that is expected to last >3 days?: No Is the patient deaf or have difficulty hearing?: No Does the patient have difficulty seeing,  even when wearing glasses/contacts?: No Does the patient have difficulty concentrating, remembering, or making decisions?: No  Permission Sought/Granted                  Emotional Assessment Appearance:: Appears stated age Attitude/Demeanor/Rapport: Gracious Affect (typically observed): Appropriate Orientation: : Oriented to Self, Oriented to Place, Oriented to  Time, Oriented to Situation Alcohol / Substance Use: Not Applicable Psych Involvement: No (comment)  Admission diagnosis:  Lower urinary tract infectious disease [N39.0] Sepsis (HCC) [A41.9] Altered mental status, unspecified altered mental status type [R41.82] Patient Active Problem List   Diagnosis Date Noted   Infection due to ESBL-producing Escherichia coli 06/01/2023   Alzheimer's dementia (HCC) 05/29/2023   Delirium 05/29/2023   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   DVT (deep venous thrombosis) (HCC) 04/08/2023   Acute metabolic encephalopathy 04/08/2023   Pulmonary embolism (HCC) 04/07/2023   Hypomagnesemia 04/07/2023   Elevated lactic acid level 04/07/2023   Myocardial injury 04/07/2023   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   Stroke (HCC) 11/26/2022   Acute encephalopathy 11/26/2022   Lower extremity weakness 06/28/2022   Cognitive impairment 05/17/2022   Anemia    Sepsis (HCC) 06/19/2021   Gross hematuria 06/19/2021   Complicated UTI (urinary tract infection) 06/18/2021   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   Genetic testing 02/22/2021   Family history of colon cancer  01/13/2021   Family history of breast cancer 01/13/2021   Encounter for antineoplastic chemotherapy 07/06/2020   Metastatic cancer to bone (HCC) 07/06/2020   Metastasis to bone (HCC) 05/28/2020   Prostate cancer (HCC) 05/11/2020   Goals of care, counseling/discussion 05/11/2020   Iliac artery aneurysm (HCC) 04/12/2016   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/2015   Cardiomyopathy (HCC) 04/24/2015   S/P cardiac catheterization 04/24/2015   Chest pain 12/23/2014   Diabetes (HCC) 11/21/2014   Chronic systolic heart failure (HCC) 11/20/2014   Tobacco abuse 11/20/2014   Pedal edema 11/03/2014   Acute on chronic combined systolic and diastolic congestive heart failure (HCC) 09/20/2014   Elevation of level of transaminase and lactic acid dehydrogenase (LDH) 09/20/2014   Essential (primary) hypertension 05/02/2014   Hyperlipidemia 05/02/2014   Shortness of breath 05/02/2014   Urinary tract infection 05/02/2014   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   PCP:  [facility] Pharmacy:   [facility] - [place], [place] - [address] [place] [address] Phone: [phone] Fax: [phone]     Social Drivers of Health (SDOH) Social History: SDOH Screenings   Food Insecurity: No Food Insecurity (05/30/2023)  Housing: Low Risk  (05/30/2023)  Transportation Needs: No Transportation Needs (05/30/2023)  Utilities: Not At Risk (05/30/2023)  Social Connections: Unknown (05/30/2023)  Tobacco Use: Medium Risk (05/30/2023)   SDOH Interventions:     Readmission Risk Interventions    05/30/2023    5:49 PM 06/21/2021   11:22 AM  Readmission Risk Prevention Plan  Transportation Screening Complete Complete  PCP or Specialist Appt within 5-7 Days  Complete  Home Care Screening  Complete  Medication Review (RN CM)  Complete  Medication Review (RN Care Manager) Complete   PCP or Specialist appointment within 3-5 days of discharge Complete   HRI or Home Care Consult Complete   SW Recovery Care/Counseling Consult Complete   Palliative Care Screening Not Applicable   Skilled Nursing Facility Not Applicable

## 2023-06-03 LAB — CULTURE, BLOOD (ROUTINE X 2)
Culture: NO GROWTH
Culture: NO GROWTH
Special Requests: ADEQUATE

## 2023-06-05 ENCOUNTER — Telehealth (INDEPENDENT_AMBULATORY_CARE_PROVIDER_SITE_OTHER): Payer: Self-pay

## 2023-06-05 NOTE — Telephone Encounter (Signed)
 Spoke with the patient and he is scheduled with Dr. Wyn Quaker for a right leg angio on 06/15/23 with a 12:30 pm arrival time to the [facility] . Pre-procedure instructions were discussed and will be faxed to attention Kya at [facility].

## 2023-06-13 ENCOUNTER — Other Ambulatory Visit: Payer: Self-pay

## 2023-06-13 DIAGNOSIS — C61 Malignant neoplasm of prostate: Secondary | ICD-10-CM

## 2023-06-14 ENCOUNTER — Emergency Department: Payer: Medicare (Managed Care)

## 2023-06-14 ENCOUNTER — Encounter: Payer: Self-pay | Admitting: *Deleted

## 2023-06-14 ENCOUNTER — Telehealth (INDEPENDENT_AMBULATORY_CARE_PROVIDER_SITE_OTHER): Payer: Self-pay

## 2023-06-14 ENCOUNTER — Inpatient Hospital Stay (HOSPITAL_BASED_OUTPATIENT_CLINIC_OR_DEPARTMENT_OTHER): Payer: Medicare (Managed Care) | Admitting: Oncology

## 2023-06-14 ENCOUNTER — Encounter: Payer: Self-pay | Admitting: Oncology

## 2023-06-14 ENCOUNTER — Emergency Department
Admission: EM | Admit: 2023-06-14 | Discharge: 2023-06-15 | Disposition: A | Discharge status: Home / Self Care | Payer: Medicare (Managed Care) | Source: Home / Self Care | Attending: Student in an Organized Health Care Education/Training Program | Admitting: Student in an Organized Health Care Education/Training Program

## 2023-06-14 ENCOUNTER — Inpatient Hospital Stay: Payer: Medicare (Managed Care) | Attending: Oncology

## 2023-06-14 VITALS — BP 128/76 | HR 111 | Temp 99.1°F | Resp 18

## 2023-06-14 DIAGNOSIS — C61 Malignant neoplasm of prostate: Secondary | ICD-10-CM | POA: Insufficient documentation

## 2023-06-14 DIAGNOSIS — Z807 Family history of other malignant neoplasms of lymphoid, hematopoietic and related tissues: Secondary | ICD-10-CM | POA: Insufficient documentation

## 2023-06-14 DIAGNOSIS — Z7901 Long term (current) use of anticoagulants: Secondary | ICD-10-CM | POA: Insufficient documentation

## 2023-06-14 DIAGNOSIS — A419 Sepsis, unspecified organism: Secondary | ICD-10-CM | POA: Diagnosis not present

## 2023-06-14 DIAGNOSIS — Z923 Personal history of irradiation: Secondary | ICD-10-CM | POA: Insufficient documentation

## 2023-06-14 DIAGNOSIS — I5020 Unspecified systolic (congestive) heart failure: Secondary | ICD-10-CM | POA: Insufficient documentation

## 2023-06-14 DIAGNOSIS — D649 Anemia, unspecified: Secondary | ICD-10-CM | POA: Diagnosis not present

## 2023-06-14 DIAGNOSIS — Z8546 Personal history of malignant neoplasm of prostate: Secondary | ICD-10-CM | POA: Insufficient documentation

## 2023-06-14 DIAGNOSIS — C7951 Secondary malignant neoplasm of bone: Secondary | ICD-10-CM | POA: Diagnosis not present

## 2023-06-14 DIAGNOSIS — Z803 Family history of malignant neoplasm of breast: Secondary | ICD-10-CM | POA: Insufficient documentation

## 2023-06-14 DIAGNOSIS — R4189 Other symptoms and signs involving cognitive functions and awareness: Secondary | ICD-10-CM | POA: Insufficient documentation

## 2023-06-14 DIAGNOSIS — Z800 Family history of malignant neoplasm of digestive organs: Secondary | ICD-10-CM | POA: Diagnosis not present

## 2023-06-14 DIAGNOSIS — Z87891 Personal history of nicotine dependence: Secondary | ICD-10-CM | POA: Insufficient documentation

## 2023-06-14 DIAGNOSIS — N3001 Acute cystitis with hematuria: Secondary | ICD-10-CM | POA: Diagnosis not present

## 2023-06-14 LAB — CBC WITH DIFFERENTIAL/PLATELET
Abs Immature Granulocytes: 0.06 10*3/uL (ref 0.00–0.07)
Basophils Absolute: 0 10*3/uL (ref 0.0–0.1)
Basophils Relative: 0 %
Eosinophils Absolute: 0.1 10*3/uL (ref 0.0–0.5)
Eosinophils Relative: 1 %
HCT: 21.4 % — ABNORMAL LOW (ref 39.0–52.0)
Hemoglobin: 6.5 g/dL — ABNORMAL LOW (ref 13.0–17.0)
Immature Granulocytes: 1 %
Lymphocytes Relative: 13 %
Lymphs Abs: 0.6 10*3/uL — ABNORMAL LOW (ref 0.7–4.0)
MCH: 30.2 pg (ref 26.0–34.0)
MCHC: 30.4 g/dL (ref 30.0–36.0)
MCV: 99.5 fL (ref 80.0–100.0)
Monocytes Absolute: 0.3 10*3/uL (ref 0.1–1.0)
Monocytes Relative: 7 %
Neutro Abs: 3.4 10*3/uL (ref 1.7–7.7)
Neutrophils Relative %: 78 %
Platelets: 290 10*3/uL (ref 150–400)
RBC: 2.15 MIL/uL — ABNORMAL LOW (ref 4.22–5.81)
RDW: 18.4 % — ABNORMAL HIGH (ref 11.5–15.5)
WBC: 4.4 10*3/uL (ref 4.0–10.5)
nRBC: 1.1 % — ABNORMAL HIGH (ref 0.0–0.2)

## 2023-06-14 LAB — CBC WITH DIFFERENTIAL (CANCER CENTER ONLY)
Abs Immature Granulocytes: 0.06 10*3/uL (ref 0.00–0.07)
Basophils Absolute: 0 10*3/uL (ref 0.0–0.1)
Basophils Relative: 0 %
Eosinophils Absolute: 0.1 10*3/uL (ref 0.0–0.5)
Eosinophils Relative: 1 %
HCT: 20.8 % — ABNORMAL LOW (ref 39.0–52.0)
Hemoglobin: 6.4 g/dL — CL (ref 13.0–17.0)
Immature Granulocytes: 1 %
Lymphocytes Relative: 9 %
Lymphs Abs: 0.4 10*3/uL — ABNORMAL LOW (ref 0.7–4.0)
MCH: 30.9 pg (ref 26.0–34.0)
MCHC: 30.8 g/dL (ref 30.0–36.0)
MCV: 100.5 fL — ABNORMAL HIGH (ref 80.0–100.0)
Monocytes Absolute: 0.3 10*3/uL (ref 0.1–1.0)
Monocytes Relative: 7 %
Neutro Abs: 3.5 10*3/uL (ref 1.7–7.7)
Neutrophils Relative %: 82 %
Platelet Count: 282 10*3/uL (ref 150–400)
RBC: 2.07 MIL/uL — ABNORMAL LOW (ref 4.22–5.81)
RDW: 18.3 % — ABNORMAL HIGH (ref 11.5–15.5)
WBC Count: 4.3 10*3/uL (ref 4.0–10.5)
nRBC: 0.9 % — ABNORMAL HIGH (ref 0.0–0.2)

## 2023-06-14 LAB — COMPREHENSIVE METABOLIC PANEL WITH GFR
ALT: 10 U/L (ref 0–44)
AST: 30 U/L (ref 15–41)
Albumin: 2.8 g/dL — ABNORMAL LOW (ref 3.5–5.0)
Alkaline Phosphatase: 504 U/L — ABNORMAL HIGH (ref 38–126)
Anion gap: 9 (ref 5–15)
BUN: 29 mg/dL — ABNORMAL HIGH (ref 8–23)
CO2: 22 mmol/L (ref 22–32)
Calcium: 8.5 mg/dL — ABNORMAL LOW (ref 8.9–10.3)
Chloride: 99 mmol/L (ref 98–111)
Creatinine, Ser: 0.97 mg/dL (ref 0.61–1.24)
GFR, Estimated: >60 mL/min (ref >60)
Glucose, Bld: 209 mg/dL — ABNORMAL HIGH (ref 70–99)
Potassium: 4.3 mmol/L (ref 3.5–5.1)
Sodium: 130 mmol/L — ABNORMAL LOW (ref 135–145)
Total Bilirubin: 0.5 mg/dL (ref 0.0–1.2)
Total Protein: 6.9 g/dL (ref 6.5–8.1)

## 2023-06-14 LAB — CMP (CANCER CENTER ONLY)
ALT: 12 U/L (ref 0–44)
AST: 28 U/L (ref 15–41)
Albumin: 2.7 g/dL — ABNORMAL LOW (ref 3.5–5.0)
Alkaline Phosphatase: 534 U/L — ABNORMAL HIGH (ref 38–126)
Anion gap: 11 (ref 5–15)
BUN: 28 mg/dL — ABNORMAL HIGH (ref 8–23)
CO2: 20 mmol/L — ABNORMAL LOW (ref 22–32)
Calcium: 8.2 mg/dL — ABNORMAL LOW (ref 8.9–10.3)
Chloride: 100 mmol/L (ref 98–111)
Creatinine: 0.96 mg/dL (ref 0.61–1.24)
GFR, Estimated: >60 mL/min (ref >60)
Glucose, Bld: 235 mg/dL — ABNORMAL HIGH (ref 70–99)
Potassium: 4.2 mmol/L (ref 3.5–5.1)
Sodium: 131 mmol/L — ABNORMAL LOW (ref 135–145)
Total Bilirubin: 0.5 mg/dL (ref 0.0–1.2)
Total Protein: 6.7 g/dL (ref 6.5–8.1)

## 2023-06-14 LAB — PREPARE RBC (CROSSMATCH)

## 2023-06-14 LAB — ABO/RH: ABO/RH(D): O POS

## 2023-06-14 LAB — PSA: Prostatic Specific Antigen: 107.83 ng/mL — ABNORMAL HIGH (ref 0.00–4.00)

## 2023-06-14 MED ORDER — SODIUM CHLORIDE 0.9 % IV SOLN
10.0000 mL/h | Freq: Once | INTRAVENOUS | Status: AC
  Administered 2023-06-14: 10 mL/h via INTRAVENOUS

## 2023-06-14 MED ORDER — CARVEDILOL 6.25 MG PO TABS: 3.1250 mg | ORAL_TABLET | Freq: Two times a day (BID) | ORAL | Status: DC

## 2023-06-14 MED ORDER — FUROSEMIDE 10 MG/ML IJ SOLN
40.0000 mg | Freq: Once | INTRAMUSCULAR | Status: AC
  Administered 2023-06-14: 40 mg via INTRAVENOUS
  Filled 2023-06-14: qty 4

## 2023-06-14 MED ORDER — CARVEDILOL 6.25 MG PO TABS
3.1250 mg | ORAL_TABLET | Freq: Two times a day (BID) | ORAL | Status: DC
  Administered 2023-06-14: 3.125 mg via ORAL
  Filled 2023-06-14: qty 1

## 2023-06-14 NOTE — ED Provider Notes (Signed)
 [facility] Provider Note    Event Date/Time   First MD Initiated Contact with Patient 06/14/23 1742     (approximate)   History   abnormal labs   HPI  Matthew Mcgrath is a 78 y.o. male with very involved complex past medical history including metastatic prostate cancer, anemia, cognitive impairment, systolic heart failure on Eliquis with recent diagnosis of PE with chronic sacral wounds and heel wounds presents to the ER due to abnormal labs from outpatient clinic showing anemia.  He denies any complaints or discomfort.  Patient's pace physician contacted me and is noted over the past few months significant decline.  Family at bedside deny any report of melena hematochezia or bleeding.  No fevers or chills.     Physical Exam   Triage Vital Signs: ED Triage Vitals  Encounter Vitals Group     BP 06/14/23 1518 (!) 140/74     Systolic BP Percentile --      Diastolic BP Percentile --      Pulse Rate 06/14/23 1518 (!) 118     Resp 06/14/23 1518 20     Temp 06/14/23 1518 99.1 F (37.3 C)     Temp Source 06/14/23 1518 Oral     SpO2 06/14/23 1518 97 %     Weight 06/14/23 1514 142 lb (64.4 kg)     Height 06/14/23 1514 5\' 5"  (1.651 m)     Head Circumference --      Peak Flow --      Pain Score 06/14/23 1514 0     Pain Loc --      Pain Education --      Exclude from Growth Chart --     Most recent vital signs: Vitals:   06/14/23 2202 06/14/23 2217  BP: 113/77 118/80  Pulse: (!) 105 (!) 108  Resp: (!) 25 (!) 27  Temp: 99.9 F (37.7 C) 99.5 F (37.5 C)  SpO2:  96%     Constitutional: Alert, chronically ill appearing Eyes: Conjunctivae are normal.  Head: Atraumatic. Nose: No congestion/rhinnorhea. Mouth/Throat: Mucous membranes are moist.   Neck: Painless ROM.  Cardiovascular:   Good peripheral circulation. Respiratory: Normal respiratory effort.  No retractions.  Gastrointestinal: Soft and nontender.      ED Results / Procedures /  Treatments   Labs (all labs ordered are listed, but only abnormal results are displayed) Labs Reviewed  CBC WITH DIFFERENTIAL/PLATELET - Abnormal; Notable for the following components:      Result Value   RBC 2.15 (*)    Hemoglobin 6.5 (*)    HCT 21.4 (*)    RDW 18.4 (*)    nRBC 1.1 (*)    Lymphs Abs 0.6 (*)    All other components within normal limits  COMPREHENSIVE METABOLIC PANEL WITH GFR - Abnormal; Notable for the following components:   Sodium 130 (*)    Glucose, Bld 209 (*)    BUN 29 (*)    Calcium 8.5 (*)    Albumin 2.8 (*)    Alkaline Phosphatase 504 (*)    All other components within normal limits  TYPE AND SCREEN  PREPARE RBC (CROSSMATCH)  ABO/RH     RADIOLOGY Please see ED Course for my review and interpretation.  I personally reviewed all radiographic images ordered to evaluate for the above acute complaints and reviewed radiology reports and findings.  These findings were personally discussed with the patient.  Please see medical record for radiology report.  PROCEDURES:  Critical Care performed: No  Procedures   MEDICATIONS ORDERED IN ED: Medications  carvedilol (COREG) tablet 3.125 mg (3.125 mg Oral Given 06/14/23 2059)  furosemide (LASIX) injection 40 mg (40 mg Intravenous Given 06/14/23 1840)  0.9 %  sodium chloride infusion (10 mL/hr Intravenous New Bag/Given 06/14/23 2212)     IMPRESSION / MDM / ASSESSMENT AND PLAN / ED COURSE  I reviewed the triage vital signs and the nursing notes.                              Differential diagnosis includes, but is not limited to, anemia, acute blood loss, anemia of chronic disease, failure to thrive  Patient presenting to the ER for evaluation of symptoms as described above.  Based on symptoms, risk factors and considered above differential, this presenting complaint could reflect a potentially life-threatening illness therefore the patient will be placed on continuous pulse oximetry and telemetry for  monitoring.  Laboratory evaluation will be sent to evaluate for the above complaints.      Clinical Course as of 06/14/23 2304  Wed Jun 14, 2023  1904 Repeat hemoglobin is low.  I have ordered his home Coreg.  Will order transfusion. [PR]  1905 Chest x-ray on my review and interpretation without evidence of edema. [PR]  2304 Had further discussion with patient's family member and POA at bedside.  Family comfortable taking patient back to facility after completion of blood transfusion understanding patient may be in the process of dying and are in discussion with PCP regarding further comfort measures.  They would be agreeable to initiating hospice.  Patient nontoxic-appearing does appear comfortable in no distress. [PR]    Clinical Course User Index [PR] Willy Eddy, MD     FINAL CLINICAL IMPRESSION(S) / ED DIAGNOSES   Final diagnoses:  Symptomatic anemia     Rx / DC Orders   ED Discharge Orders     None        Note:  This document was prepared using Dragon voice recognition software and may include unintentional dictation errors.    Willy Eddy, MD 06/14/23 [phone]

## 2023-06-14 NOTE — ED Triage Notes (Signed)
 First Nurse Note: Patient to ED from [facility] for hgb of 6.4. MD concerned for bone marrow involvement. MD states she would like patient to stop taking lynparza and if admitted be consulted with palliative care.

## 2023-06-14 NOTE — Progress Notes (Signed)
 Received critical lab from [facility]- lab   Hemoglobin 6.4.   MD notified

## 2023-06-14 NOTE — Assessment & Plan Note (Signed)
Continue follow up with primary care provider 

## 2023-06-14 NOTE — ED Triage Notes (Addendum)
 Pt to triage via wheelchair.  Sister with pt who says she is legal guardian. Pt is her for abnormal labs and needs a blood transfusion per sister.  Pt was seen at [facility] and sent to er for eval.  Pt denies any pain.  Hx prostate cancer.  Pt alert.  Labs done earlier at cancer center.

## 2023-06-14 NOTE — Assessment & Plan Note (Signed)
 S/p palliative RT Sister and patient previously declined bone strengthening treatment. See above

## 2023-06-14 NOTE — Progress Notes (Signed)
 Hematology/Oncology Progress note [phone] Fax:([phone]) [phone]     CHIEF COMPLAINTS/REASON FOR VISIT:  metastatic prostate cancer   ASSESSMENT & PLAN:   Cancer Staging  Prostate cancer [facility]) Staging form: Prostate, AJCC 8th Edition - Clinical stage from 05/11/2020: Stage IVB (cTX, cN0, cM1, PSA: 30) - Signed by Matthew Patience, MD on 05/11/2020   Prostate cancer [facility]) #Metastatic prostate cancer to bone, castration resistance disease. #NGS showed BRCA 2 S1404, BRIP1 R173C 3X, FOXO1 1S347fs. 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   PSA nadir 0.01. PMSA showed new bone lesions, s/p palliative radiation.  PSA 3.29 --> 4.72-->0.9 -->0.16-->0.17-->4.97 Labs are reviewed and discussed with patient. Eligard 45 mg Q6 months with [facility]- Continue Olarparib 250mg  BID [ dose reduced due to performance status].  Patient will get medication through [facility].   PSMA PET showed multiple bone metastatic disease.- disease progression.  Patient's sister has decided not to proceed with Pluvicto or Radiam 223 treatments. Today's hemoglobin has decreased.  Increased nucleated RBC.  I suspect that patient has bone marrow involvement of prostate cancer.  Olaparib may also attribute to low hemoglobin.  Recommend patient to stop Prognosis is poor and his condition has rapidly declined..  Recommend hospice.    Symptomatic anemia Recommend patient to go to emergency room for evaluation with blood transfusion  Metastasis to bone [facility]) S/p palliative RT Sister and patient previously declined bone strengthening treatment. See above   Cognitive impairment Continue follow up with primary care provider  I have called the ER and updated patient's information to triage nurse.  Recommend blood transfusion and consult palliative care service for transition to hospice care. Sister agrees with the plan. All questions  were answered. The patient knows to call the clinic with any problems, questions or concerns.  Matthew Patience, MD, PhD [facility] Hematology Oncology 06/14/2023    HISTORY OF PRESENTING ILLNESS:   Matthew Mcgrath is a  78 y.o.  male presents for metastatic prostate cancer. Oncology History  Prostate cancer (HCC)  04/16/2020 Procedure   Firmagon 240 mg loading dose   04/21/2020 Imaging   CT abdomen pelvis showed a sclerotic lesion in the lumbar spine concerning for probable metastatic disease.  No definitive extra skeletal metastatic disease noted elsewhere in the abdomen or pelvis.  Mild circumferential bladder wall thickening.  Aortic atherosclerosis.   05/11/2020 Initial Diagnosis   Prostate cancer   04/01/2020, prostate biopsy showed  All cores were positive for high risk prostate cancer, primarily Gleason score 5+4 = 9, with max core involvement of 100% and perineural invasion present.   05/11/2020 Cancer Staging   Staging form: Prostate, AJCC 8th Edition - Clinical stage from 05/11/2020: Stage IVB (cTX, cN0, cM1, PSA: 30) - Signed by Matthew Patience, MD on 05/11/2020 Prostate specific antigen (PSA) range: 20 or greater   10/19/2020 -  Radiation Therapy   finished definitive RT. to prostate cancer and pelvic lymph node treatments.   02/13/2021 Imaging   CT chest abdomen pelvis without contrast showed slight interval increase in sclerosis of previously osseous metastasis involving lumbar spine, most consistent with posttreatment sclerosis.  Focally severe disc degeneration and sclerosis versus sclerotic osseous metastatic of T6 and 7.  Attention on follow-up.  No noncontrast evidence of lymphadenopathy or soft tissue metastatic disease in chest abdomen pelvis.  Prostamegaly.  Enlargement of main pulmonary artery.  Coronary artery disease.   02/21/2021 Genetic  Testing   [employer] Multi-Cancer+RNA cancer panel found a single, pathogenic variant in BRCA2 called [id number]. The remainder of testing was  negative/normal.      03/11/2021 Imaging   , bone scan showed improved/decreased uptake in T6 and L2   04/27/2021 Imaging   Bone scan bone scan showed foci of abnormal uptake noted in the mid thoracic and upper lumbar spine concerning for metastatic disease   12/07/2021 Imaging   CT chest abdomen pelvis wo contrast 1. Mild prostatomegaly without discrete mass appreciated by CT. 2. Unchanged sclerotic osseous metastatic disease, for example of the L1 and L2 vertebral bodies. 3. Unchanged, focally severe disc degenerative disease at T6-T7 with sclerosis of the vertebral bodies, most likely degenerative change although underlying osseous metastasis again not excluded. Continued attention on follow-up. 4. No noncontrast evidence of lymphadenopathy or soft tissue metastatic disease to the chest, abdomen, or pelvis. 5. Enlargement of the main pulmonary artery, as can be seen in pulmonary hypertension. 6. Coronary artery disease.   12/07/2021 Imaging   Bone scan showed 1. There is faint activity in the upper lumbar region, approximate L2 level, likely corresponds to faint sclerosis/metastasis on CT 2. New focus of activity at the left inferior sternum and right sixth rib anteriorly, possibly metastatic but no definitive corresponding lesion on CT. 3. New prominent activity at the left first rib end/sternoclavicular junction potentially degenerative, given appearance on CT. 4. Probable degenerative uptake at the bilateral feet, shoulders,and midthoracic spine   #06/11/2020 patient went to [facility] for second opinion was seen by [name] who agrees with continuing ADT with switch to Eligard 45 mg.  He is due to proceed with the treatments today.  [facility]icated with [facility] [name], patient will receive treatment at [facility]. [name] also recommends to start darolutamide and authorization process has been initiated at [facility]. #07/06/2020 started darolutamide 600 mg twice daily # definitive treatment for prostate  and pelvic node- Discussed with [facility] [name] and I appreciate his expert opinion of darolutamide concurrent use with RT and ADT  INTERVAL HISTORY KADENCE MIMBS is a 78 y.o. male who has above history reviewed by me today presents for follow up visit for management of metastatic prostate cancer Patient was accompanied by his brother.  He is currently on olaparib 250mg  twice daily He denies any new bone pain.  Recent hospitalization from 05/29/2023 - 06/01/2023 due to sepsis, ESBL E. coli UTI and acute metabolic encephalopathy. Today he is accompanied by her sister. Sister reports that patient appears to be weak.  Patient is lethargic and not able to communicate much.  No reports of blood in the stool or dark stool.  Review of Systems  Constitutional:  Positive for fatigue. Negative for appetite change, chills, fever and unexpected weight change.  HENT:   Negative for hearing loss and voice change.   Eyes:  Negative for eye problems and icterus.  Respiratory:  Negative for chest tightness, cough and shortness of breath.   Cardiovascular:  Negative for chest pain and leg swelling.  Gastrointestinal:  Negative for abdominal distention and abdominal pain.  Endocrine: Negative for hot flashes.  Genitourinary:  Positive for dyspareunia. Negative for difficulty urinating, dysuria and frequency.   Musculoskeletal:  Positive for back pain. Negative for arthralgias.  Skin:  Negative for itching and rash.  Neurological:  Positive for extremity weakness. Negative for light-headedness and numbness.  Hematological:  Negative for adenopathy. Does not bruise/bleed easily.    MEDICAL HISTORY:  Past Medical History:  Diagnosis Date  Cardiomyopathy (HCC)    Congestive heart failure (HCC)    Diabetes mellitus type 2, controlled (HCC)    Elevated lipids    Family history of breast cancer    Family history of colon cancer    HTN (hypertension)     SURGICAL HISTORY: Past Surgical History:  Procedure  Laterality Date   CARDIAC CATHETERIZATION N/A 04/14/2015   Procedure: Left Heart Cath and Coronary Angiography;  Surgeon: Marcina Millard, MD;  Location: [facility] INVASIVE CV LAB;  Service: Cardiovascular;  Laterality: N/A;   CATARACT EXTRACTION W/ INTRAOCULAR LENS  IMPLANT, BILATERAL Left    COLONOSCOPY WITH PROPOFOL N/A 04/08/2020   Procedure: COLONOSCOPY WITH PROPOFOL;  Surgeon: Toledo, Boykin Nearing, MD;  Location: [facility] ENDOSCOPY;  Service: Gastroenterology;  Laterality: N/A;   CYSTOSCOPY W/ RETROGRADES Bilateral 07/02/2021   Procedure: CYSTOSCOPY WITH RETROGRADE PYELOGRAM;  Surgeon: Sondra Come, MD;  Location: [facility] ORS;  Service: Urology;  Laterality: Bilateral;   HERNIA REPAIR     IMPLANTABLE CARDIOVERTER DEFIBRILLATOR (ICD) GENERATOR CHANGE Left 07/03/2015   Procedure: ICD IMPLANT single chamber;  Surgeon: Sharion Settler, MD;  Location: [facility] ORS;  Service: Cardiovascular;  Laterality: Left;   TRANSURETHRAL RESECTION OF BLADDER TUMOR N/A 07/02/2021   Procedure: TRANSURETHRAL RESECTION OF BLADDER TUMOR (TURBT);  Surgeon: Sondra Come, MD;  Location: [facility] ORS;  Service: Urology;  Laterality: N/A;    SOCIAL HISTORY: Social History   Socioeconomic History   Marital status: Single    Spouse name: Not on file   Number of children: Not on file   Years of education: Not on file   Highest education level: Not on file  Occupational History   Not on file  Tobacco Use   Smoking status: Former    Current packs/day: 0.00    Average packs/day: 0.3 packs/day for 30.0 years (7.5 ttl pk-yrs)    Types: Cigarettes    Start date: 02/10/1990    Quit date: 02/11/2020    Years since quitting: 3.3    Passive exposure: Past   Smokeless tobacco: Never  Vaping Use   Vaping status: Never Used  Substance and Sexual Activity   Alcohol use: No   Drug use: Not Currently    Frequency: 2.0 times per week    Types: Marijuana    Comment: past use   Sexual activity: Not on file  Other Topics Concern   Not  on file  Social History Narrative   Not on file   Social Drivers of Health   Financial Resource Strain: Not on file  Food Insecurity: No Food Insecurity (05/30/2023)   Hunger Vital Sign    Worried About Running Out of Food in the Last Year: Never true    Ran Out of Food in the Last Year: Never true  Transportation Needs: No Transportation Needs (05/30/2023)   PRAPARE - Administrator, Civil Service (Medical): No    Lack of Transportation (Non-Medical): No  Physical Activity: Not on file  Stress: Not on file  Social Connections: Unknown (05/30/2023)   Social Connection and Isolation Panel [NHANES]    Frequency of Communication with Friends and Family: Once a week    Frequency of Social Gatherings with Friends and Family: Twice a week    Attends Religious Services: Never    Database administrator or Organizations: No    Attends Banker Meetings: Never    Marital Status: Patient declined  Intimate Partner Violence: Not At Risk (05/30/2023)   Humiliation, Afraid,  Rape, and Kick questionnaire    Fear of Current or Ex-Partner: No    Emotionally Abused: No    Physically Abused: No    Sexually Abused: No    FAMILY HISTORY: Family History  Problem Relation Age of Onset   Heart failure Mother    Heart disease Mother    Alcoholism Father    Hypertension Sister    Diabetes Sister    Hypertension Brother    Diabetes Brother    Colon cancer Brother        vs prostate   Multiple myeloma Brother    Breast cancer Cousin     ALLERGIES:  has no known allergies.  MEDICATIONS:  No current facility-administered medications for this visit.   Current Outpatient Medications  Medication Sig Dispense Refill   acetaminophen (TYLENOL) 325 MG tablet Take 2 tablets (650 mg total) by mouth every 4 (four) hours as needed for mild pain (or temp > 37.5 C (99.5 F)). (Patient taking differently: Take 650 mg by mouth 2 (two) times daily.)     Amino Acids-Protein Hydrolys  (PRO-STAT AWC) LIQD Take 30 mLs by mouth 2 (two) times daily.     apixaban (ELIQUIS) 5 MG TABS tablet Take 10 mg twice daily for 5 days followed by 5 mg twice daily.  Continue Eliquis until further notice from your primary care physician or oncologist. (Patient taking differently: Take 5 mg by mouth 2 (two) times daily.)     BEN GAY ULTRA STRENGTH 06-14-28 % CREA Apply 1 Application topically 3 (three) times daily. Apply to lower back     carvedilol (COREG) 3.125 MG tablet Take 3.125 mg by mouth 2 (two) times daily.     cholecalciferol (VITAMIN D3) 25 MCG (1000 UNIT) tablet Take 1,000 Units by mouth daily.     ferrous sulfate 325 (65 FE) MG EC tablet Take 1 tablet (325 mg total) by mouth daily. 90 tablet 1   furosemide (LASIX) 40 MG tablet Take 40 mg by mouth every Monday, Tuesday, Wednesday, Thursday, and Friday.     gabapentin (NEURONTIN) 100 MG capsule Take 300 mg by mouth 2 (two) times daily.     GLUMETZA 1000 MG 24 hr tablet Take 1,000 mg by mouth 2 (two) times daily with a meal.     insulin glargine (LANTUS) 100 UNIT/ML injection Inject 10 Units into the skin every evening.     JARDIANCE 10 MG TABS tablet Take 10 mg by mouth daily.     LEUPROLIDE ACETATE, 6 MONTH, 45 MG injection Inject into the skin.     LYNPARZA 100 MG tablet Take 100 mg by mouth 2 (two) times daily.     LYNPARZA 150 MG tablet Take 150 mg by mouth 2 (two) times daily.     magnesium oxide (MAG-OX) 400 (240 Mg) MG tablet Take 1 tablet by mouth daily.     metFORMIN (GLUCOPHAGE-XR) 500 MG 24 hr tablet Take 1,000 mg by mouth 2 (two) times daily.     Multiple Vitamin (ONE-DAILY MULTI-VITAMIN) TABS Take 1 tablet by mouth daily.     Omega-3 Fatty Acids (FISH OIL) 1000 MG CAPS Take 1 capsule by mouth 2 (two) times daily.     omeprazole (PRILOSEC) 10 MG capsule Take 10 mg by mouth daily.     oxyCODONE (OXY IR/ROXICODONE) 5 MG immediate release tablet Take 5 mg by mouth 3 (three) times daily.     senna (SENOKOT) 8.6 MG TABS tablet  Take 2 tablets by mouth at bedtime.  spironolactone (ALDACTONE) 25 MG tablet Take 0.5 tablets (12.5 mg total) by mouth daily.     tamsulosin (FLOMAX) 0.4 MG CAPS capsule Take 1 capsule (0.4 mg total) by mouth daily. (Patient taking differently: Take 0.4 mg by mouth at bedtime.) 30 capsule 2   Zinc Oxide (DESITIN) 13 % CREA Apply 1 application  topically in the morning, at noon, in the evening, and at bedtime. Apply liberally to diaper/gluteal area     Facility-Administered Medications Ordered in Other Visits  Medication Dose Route Frequency Provider Last Rate Last Admin   0.9 %  sodium chloride infusion  10 mL/hr Intravenous Once Willy Eddy, MD       carvedilol (COREG) tablet 3.125 mg  3.125 mg Oral BID WC Willy Eddy, MD   3.125 mg at 06/14/23 2059     PHYSICAL EXAMINATION: ECOG PERFORMANCE STATUS: 1 - Symptomatic but completely ambulatory Vitals:   06/14/23 1435  BP: 128/76  Pulse: (!) 111  Resp: 18  Temp: 99.1 F (37.3 C)   There were no vitals filed for this visit.   Physical Exam HENT:     Head: Normocephalic.  Eyes:     General: No scleral icterus. Cardiovascular:     Rate and Rhythm: Normal rate.  Pulmonary:     Effort: Pulmonary effort is normal. No respiratory distress.  Abdominal:     General: Bowel sounds are normal. There is no distension.     Palpations: Abdomen is soft.  Musculoskeletal:     Cervical back: Normal range of motion and neck supple.  Skin:    Findings: No rash.  Neurological:     Mental Status: He is alert.     Comments: Lethargic       LABORATORY DATA:  I have reviewed the data as listed    Latest Ref Rng & Units 06/14/2023    6:01 PM 06/14/2023    2:08 PM 06/01/2023    4:53 AM  CBC  WBC 4.0 - 10.5 K/uL 4.4  4.3  4.2   Hemoglobin 13.0 - 17.0 g/dL 6.5  6.4  9.0   Hematocrit 39.0 - 52.0 % 21.4  20.8  28.8   Platelets 150 - 400 K/uL 290  282  296       Latest Ref Rng & Units 06/14/2023    6:01 PM 06/14/2023    2:08 PM  05/30/2023    6:24 AM  CMP  Glucose 70 - 99 mg/dL 086  578  469   BUN 8 - 23 mg/dL 29  28  21    Creatinine 0.61 - 1.24 mg/dL 6.29  5.28  4.13   Sodium 135 - 145 mmol/L 130  131  139   Potassium 3.5 - 5.1 mmol/L 4.3  4.2  3.8   Chloride 98 - 111 mmol/L 99  100  104   CO2 22 - 32 mmol/L 22  20  24    Calcium 8.9 - 10.3 mg/dL 8.5  8.2  8.7   Total Protein 6.5 - 8.1 g/dL 6.9  6.7    Total Bilirubin 0.0 - 1.2 mg/dL 0.5  0.5    Alkaline Phos 38 - 126 U/L 504  534    AST 15 - 41 U/L 30  28    ALT 0 - 44 U/L 10  12      Iron/TIBC/Ferritin/ %Sat    Component Value Date/Time   IRON 43 (L) 05/30/2023 0624   TIBC 216 (L) 05/30/2023 0624   FERRITIN 357 (H) 05/30/2023  1610   IRONPCTSAT 20 05/30/2023 0624     RADIOGRAPHIC STUDIES: I have personally reviewed the radiological images as listed and agreed with the findings in the report. DG Chest Portable 1 View Result Date: 06/14/2023 CLINICAL DATA:  Shortness of breath. EXAM: PORTABLE CHEST 1 VIEW COMPARISON:  05/29/2023 FINDINGS: Stable cardiomegaly. Unchanged mediastinal contours. Aortic atherosclerosis. Left-sided pacemaker in place. No pulmonary edema. Subsegmental atelectasis at the left lung base. No pleural effusion or pneumothorax. No acute osseous findings. IMPRESSION: Stable cardiomegaly. Subsegmental atelectasis at the left lung base. No pulmonary edema. Electronically Signed   By: Narda Rutherford M.D.   On: 06/14/2023 20:37   CT HEAD WO CONTRAST ( ) Result Date: 05/29/2023 CLINICAL DATA:  altered, on eliquis, eval ICH EXAM: CT HEAD WITHOUT CONTRAST TECHNIQUE: Contiguous axial images were obtained from the base of the skull through the vertex without intravenous contrast. RADIATION DOSE REDUCTION: This exam was performed according to the departmental dose-optimization program which includes automated exposure control, adjustment of the mA and/or kV according to patient size and/or use of iterative reconstruction technique. COMPARISON:  CT  head 04/07/2023 FINDINGS: Brain: Cerebral ventricle sizes are concordant with the degree of cerebral volume loss. Patchy and confluent areas of decreased attenuation are noted throughout the deep and periventricular white matter of the cerebral hemispheres bilaterally, compatible with chronic microvascular ischemic disease. No evidence of large-territorial acute infarction. No parenchymal hemorrhage. No mass lesion. No extra-axial collection. No mass effect or midline shift. No hydrocephalus. Basilar cisterns are patent. Vascular: No hyperdense vessel. Atherosclerotic calcifications are present within the cavernous internal carotid and vertebral arteries. Skull: No acute fracture or focal lesion. Sinuses/Orbits: Paranasal sinuses and mastoid air cells are clear. The orbits are unremarkable. Other: None. IMPRESSION: No acute intracranial abnormality. Electronically Signed   By: Tish Frederickson M.D.   On: 05/29/2023 18:56   DG Chest Port 1 View Result Date: 05/29/2023 CLINICAL DATA:  Questionable sepsis EXAM: PORTABLE CHEST 1 VIEW COMPARISON:  Chest x-ray 04/07/2023 FINDINGS: Left-sided pacemaker is present. The heart is enlarged. There is no focal lung infiltrate, pleural effusion or pneumothorax. No acute fractures are seen. IMPRESSION: Cardiomegaly. No active disease. Electronically Signed   By: Darliss Cheney M.D.   On: 05/29/2023 15:59   US ARTERIAL LOWER EXTREMITY DUPLEX BILATERAL Result Date: 05/16/2023 CLINICAL DATA:  78 year old male with lower extremity heel wounds EXAM: NONINVASIVE PHYSIOLOGIC VASCULAR STUDY OF BILATERAL LOWER EXTREMITIES TECHNIQUE: Evaluation of both lower extremities was performed at rest, including calculation of ankle-brachial indices, directed duplex COMPARISON:  None Available. FINDINGS: Right ABI:  Not acquired Left ABI:  Not acquired Right Lower Extremity: Directed duplex right lower extremity demonstrates monophasic common femoral artery, profunda femoris, proximal superficial  femoral artery. Evidence of occlusion in the mid segment of the SFA with monophasic signal distal. Monophasic popliteal artery, posterior tibial artery, anterior tibial artery. Left Lower Extremity: Directed duplex left lower extremity demonstrates triphasic common femoral artery, profunda femoris. Monophasic proximal SFA, mid SFA, distal SFA. Monophasic popliteal artery, posterior tibial artery and anterior tibial artery IMPRESSION: Right: The directed duplex demonstrates occlusive iliac arterial disease, with monophasic common femoral artery waveform. Occlusion in the mid segment of the SFA, with abnormal monophasic popliteal artery and tibial arteries. Left: Directed duplex demonstrates mild iliac arterial disease, with proximal SFA occlusion, abnormal monophasic popliteal artery and tibial arteries. Signed, Yvone Neu. Miachel Roux, RPVI [facility] Electronically Signed   By: Gilmer Mor D.O.   On: 05/16/2023 09:42  ECHOCARDIOGRAM COMPLETE Addendum Date: 04/12/2023 Pacer lead in RV Alwyn Pea MD Electronically Amended 04/12/2023, 6:55 PM   Final [facility])    Result Date: 04/12/2023    ECHOCARDIOGRAM REPORT   Patient Name:   LAIN TETTERTON Date of Exam: 04/10/2023 Medical Rec #:  [id number]      Height:       65.0 in Accession #:    [id number]     Weight:       187.4 lb Date of Birth:  12-Nov-1945      BSA:          1.924 m Patient Age:    77 years       BP:           108/73 mmHg Patient Gender: M              HR:           80 bpm. Exam Location:  [facility] Procedure: 2D Echo, Cardiac Doppler, Color Doppler and Strain Analysis                                MODIFIED REPORT: This report was modified by Alwyn Pea MD on 04/12/2023 due to Pacer lead                                     in RV.  Indications:     Pulmonary embolus  History:         Patient has prior history of Echocardiogram examinations, most                  recent 11/27/2022. CHF and  Cardiomyopathy, Defibrillator,                  Stroke, Signs/Symptoms:Chest Pain, Edema and Shortness of                  Breath; Risk Factors:Hypertension, Diabetes and Dyslipidemia.  Sonographer:     Mikki Harbor Referring Phys:  [id number] Lorretta Harp Diagnosing Phys: Alwyn Pea MD  Sonographer Comments: Global longitudinal strain was attempted. IMPRESSIONS  1. Left ventricular ejection fraction, by estimation, is 40 to 45%. The left ventricle has mildly decreased function. The left ventricle demonstrates global hypokinesis. There is mild concentric left ventricular hypertrophy. Left ventricular diastolic parameters are consistent with Grade III diastolic dysfunction (restrictive).  2. Right ventricular systolic function is mildly reduced. The right ventricular size is mildly enlarged. Mildly increased right ventricular wall thickness. There is normal pulmonary artery systolic pressure.  3. The mitral valve is normal in structure. Trivial mitral valve regurgitation.  4. The aortic valve is normal in structure. Aortic valve regurgitation is not visualized. Aortic valve sclerosis is present, with no evidence of aortic valve stenosis. FINDINGS  Left Ventricle: Left ventricular ejection fraction, by estimation, is 40 to 45%. The left ventricle has mildly decreased function. The left ventricle demonstrates global hypokinesis. The left ventricular internal cavity size was normal in size. There is  mild concentric left ventricular hypertrophy. Left ventricular diastolic parameters are consistent with Grade III diastolic dysfunction (restrictive). Right Ventricle: The right ventricular size is mildly enlarged. Mildly increased right ventricular wall thickness. Right ventricular systolic function is mildly reduced. There is normal pulmonary artery systolic pressure. The tricuspid regurgitant velocity is 2.23 m/s, and with an assumed right atrial pressure  of 8 mmHg, the estimated right ventricular systolic pressure is  27.9 mmHg. Left Atrium: Left atrial size was normal in size. Right Atrium: Right atrial size was normal in size. Pericardium: There is no evidence of pericardial effusion. Mitral Valve: The mitral valve is normal in structure. Trivial mitral valve regurgitation. MV peak gradient, 7.3 mmHg. The mean mitral valve gradient is 2.0 mmHg. Tricuspid Valve: The tricuspid valve is normal in structure. Tricuspid valve regurgitation is trivial. Aortic Valve: The aortic valve is normal in structure. Aortic valve regurgitation is not visualized. Aortic valve sclerosis is present, with no evidence of aortic valve stenosis. Aortic valve mean gradient measures 3.0 mmHg. Aortic valve peak gradient measures 7.2 mmHg. Aortic valve area, by VTI measures 2.91 cm. Pulmonic Valve: The pulmonic valve was normal in structure. Pulmonic valve regurgitation is not visualized. Aorta: The ascending aorta was not well visualized. IAS/Shunts: No atrial level shunt detected by color flow Doppler. Additional Comments: A device lead is visualized.  LEFT VENTRICLE PLAX 2D LVIDd:         4.10 cm     Diastology LVIDs:         3.40 cm     LV e' medial:    4.13 cm/s LV PW:         1.30 cm     LV E/e' medial:  27.4 LV IVS:        1.20 cm     LV e' lateral:   6.09 cm/s LVOT diam:     2.20 cm     LV E/e' lateral: 18.6 LV SV:         74 LV SV Index:   38 LVOT Area:     3.80 cm  LV Volumes (MOD) LV vol d, MOD A2C: 63.1 ml LV vol d, MOD A4C: 93.0 ml LV vol s, MOD A2C: 36.9 ml LV vol s, MOD A4C: 42.1 ml LV SV MOD A2C:     26.2 ml LV SV MOD A4C:     93.0 ml LV SV MOD BP:      38.1 ml RIGHT VENTRICLE RV Basal diam:  4.90 cm RV Mid diam:    4.15 cm RV S prime:     12.70 cm/s TAPSE (M-mode): 2.5 cm LEFT ATRIUM             Index        RIGHT ATRIUM           Index LA diam:        2.80 cm 1.46 cm/m   RA Area:     21.60 cm LA Vol (A2C):   34.7 ml 18.03 ml/m  RA Volume:   61.10 ml  31.75 ml/m LA Vol (A4C):   40.3 ml 20.94 ml/m LA Biplane Vol: 40.8 ml 21.20 ml/m   AORTIC VALVE                    PULMONIC VALVE AV Area (Vmax):    3.06 cm     PV Vmax:       0.99 m/s AV Area (Vmean):   3.19 cm     PV Peak grad:  3.9 mmHg AV Area (VTI):     2.91 cm AV Vmax:           134.00 cm/s AV Vmean:          81.100 cm/s AV VTI:            0.253 m AV Peak Grad:  7.2 mmHg AV Mean Grad:      3.0 mmHg LVOT Vmax:         108.00 cm/s LVOT Vmean:        68.100 cm/s LVOT VTI:          0.194 m LVOT/AV VTI ratio: 0.77  AORTA Ao Root diam: 3.70 cm MITRAL VALVE                TRICUSPID VALVE MV Area (PHT): 5.62 cm     TR Peak grad:   19.9 mmHg MV Area VTI:   2.44 cm     TR Vmax:        223.00 cm/s MV Peak grad:  7.3 mmHg MV Mean grad:  2.0 mmHg     SHUNTS MV Vmax:       1.35 m/s     Systemic VTI:  0.19 m MV Vmean:      66.2 cm/s    Systemic Diam: 2.20 cm MV Decel Time: 135 msec MV E velocity: 113.00 cm/s Alwyn Pea MD Electronically signed by Alwyn Pea MD Signature Date/Time: 04/12/2023/6:22:33 PM    Final (Amended)    US Venous Img Lower Bilateral (DVT) Result Date: 04/07/2023 CLINICAL DATA:  Acute pulmonary embolus EXAM: BILATERAL LOWER EXTREMITY VENOUS DOPPLER ULTRASOUND TECHNIQUE: Gray-scale sonography with compression, as well as color and duplex ultrasound, were performed to evaluate the deep venous system(s) from the level of the common femoral vein through the popliteal and proximal calf veins. COMPARISON:  None Available. FINDINGS: VENOUS Within the left lower extremity, there is normal compressibility of the common femoral, superficial femoral, and popliteal veins, as well as the visualized calf veins. Visualized portions of profunda femoral vein and great saphenous vein unremarkable. No filling defects to suggest DVT on grayscale or color Doppler imaging. Doppler waveforms show normal direction of venous flow, normal respiratory plasticity and response to augmentation. Within the right lower extremity, the common femoral vein is widely patent with appropriate  antegrade flow, compressibility, and respiratory variation. Visualized profundus femoral vein and greater saphenous vein and saphenofemoral junction patent. The femoral vein demonstrates focal, anechoic, occlusive thrombus within the mid segment and nonocclusive DVT within the distal segment. There is lack of appropriate augmentation within the more proximal femoral vein in keeping with intervening occlusion. The popliteal vein and infrapopliteal venous vasculature appears widely patent. OTHER None. Limitations: none IMPRESSION: 1. Occlusive DVT within the mid segment of the right femoral vein and nonocclusive DVT within the distal segment. 2. No evidence of left lower extremity DVT. Electronically Signed   By: Helyn Numbers M.D.   On: 04/07/2023 22:15   CT Angio Chest PE W and/or Wo Contrast Result Date: 04/07/2023 CLINICAL DATA:  Pulmonary embolus suspected with high probability. EXAM: CT ANGIOGRAPHY CHEST WITH CONTRAST TECHNIQUE: Multidetector CT imaging of the chest was performed using the standard protocol during bolus administration of intravenous contrast. Multiplanar CT image reconstructions and MIPs were obtained to evaluate the vascular anatomy. RADIATION DOSE REDUCTION: This exam was performed according to the departmental dose-optimization program which includes automated exposure control, adjustment of the mA and/or kV according to patient size and/or use of iterative reconstruction technique. CONTRAST:  75mL OMNIPAQUE IOHEXOL 350 MG/ML SOLN COMPARISON:  PET-CT 03/22/2023.  CT chest 12/07/2021 FINDINGS: Cardiovascular: Technically adequate study with good opacification of the central and segmental pulmonary arteries. Filling defects are demonstrated in bilateral main pulmonary arteries, extending into upper, middle, and lower lobe branches with moderate clot burden. This is  consistent with acute pulmonary embolus. RV to LV ratio is 0.9, borderline for possible right heart strain. Normal caliber  thoracic aorta. Calcification of the aorta and coronary arteries. Mediastinum/Nodes: Esophagus is decompressed. No significant lymphadenopathy. Thyroid gland is unremarkable. Cardiac pacemaker. Lungs/Pleura: Emphysematous changes in the lungs. Atelectasis in the lung bases. No pleural effusion or pneumothorax. Upper Abdomen: No acute abnormalities. Musculoskeletal: Multiple sclerotic lesions demonstrated in the spine, sternum, and ribs consistent with known metastatic disease. Review of the MIP images confirms the above findings. IMPRESSION: 1. Positive examination for multiple bilateral pulmonary emboli. RV to LV ratio is 0.9, borderline for right heart strain. Clinical correlation recommended. 2. Known skeletal metastases are demonstrated. 3. Emphysematous changes in the lungs. 4. Aortic atherosclerosis Critical Value/emergent results were called by telephone at the time of interpretation on 04/07/2023 at 6:30 pm to provider [facility] , who verbally acknowledged these results. Electronically Signed   By: Burman Nieves M.D.   On: 04/07/2023 18:44   CT HEAD WO CONTRAST ( ) Result Date: 04/07/2023 CLINICAL DATA:  New onset headache EXAM: CT HEAD WITHOUT CONTRAST TECHNIQUE: Contiguous axial images were obtained from the base of the skull through the vertex without intravenous contrast. RADIATION DOSE REDUCTION: This exam was performed according to the departmental dose-optimization program which includes automated exposure control, adjustment of the mA and/or kV according to patient size and/or use of iterative reconstruction technique. COMPARISON:  01/16/2023 FINDINGS: Brain: Stable chronic small-vessel ischemic changes throughout the white matter. No evidence of acute infarct or hemorrhage. Lateral ventricles and midline structures are stable. No acute extra-axial fluid collections. No mass effect. Stable cerebral atrophy. Vascular: No hyperdense vessel or unexpected calcification. Skull: Normal. Negative  for fracture or focal lesion. Sinuses/Orbits: No acute finding. Other: None. IMPRESSION: 1. Stable head CT, no acute process. Electronically Signed   By: Sharlet Salina M.D.   On: 04/07/2023 18:28   DG Chest Port 1 View Result Date: 04/07/2023 CLINICAL DATA:  Question of sepsis. New onset shortness of breath. Rigors. Altered mental status. EXAM: PORTABLE CHEST 1 VIEW COMPARISON:  11/26/2022 FINDINGS: Cardiac pacemaker. Shallow inspiration. Atelectasis or infiltration in the lung bases. Mild cardiac enlargement. No vascular congestion or edema. Calcification of the aorta. Mediastinal contours appear intact. IMPRESSION: Cardiac enlargement. Shallow inspiration with infiltration or atelectasis in the lung bases. Electronically Signed   By: Burman Nieves M.D.   On: 04/07/2023 17:07   NM PET (PSMA) SKULL TO MID THIGH Result Date: 03/22/2023 CLINICAL DATA:  Prostate carcinoma with biochemical recurrence. Castrate resistant prostate carcinoma. Bone metastasis. Increasing PSA. EXAM: NUCLEAR MEDICINE PET SKULL BASE TO THIGH TECHNIQUE: 8.34 mCi Flotufolastat (Posluma) was injected intravenously. Full-ring PET imaging was performed from the skull base to thigh after the radiotracer. CT data was obtained and used for attenuation correction and anatomic localization. COMPARISON:  PSA PET scan 03/31/2022 FINDINGS: NECK No radiotracer activity in neck lymph nodes. Incidental CT finding: None. CHEST No radiotracer accumulation within mediastinal or hilar lymph nodes. No suspicious pulmonary nodules on the CT scan. Incidental CT finding: None. ABDOMEN/PELVIS Prostate: No focal activity in the prostate bed. Lymph nodes: No abnormal radiotracer accumulation within pelvic or abdominal nodes. Liver: No evidence of liver metastasis. Incidental CT finding: Atherosclerotic calcification of the aorta. Bladder is thick-walled. SKELETON Multiple new radiotracer avid lesions within the bilateral ribs and consistent with new skeletal  metastasis. Intense lesions in the RIGHT second third and fifth ribs (image 56 example). New lesions in the LEFT second rib. New lesion in the  T5 vertebral body. New lesion in the L4 vertebral body. New lesions in the LEFT and RIGHT iliac bones. New lesions are intense with the RIGHT 5 th rib lesion with SUV max equal 21.9. RIGHT iliac lesion with SUV max equal 25. The previous described lesions in the sternum and thoracic spine have improved with minimal radiotracer activity. There is interval sclerosis at the sites. IMPRESSION: 1. Multiple new intense radiotracer avid skeletal metastasis involving the bilateral ribs, thoracic spine, lumbar spine and pelvis. 2. Improved radiotracer activity at the sternum and thoracic spine metastasis. 3. No evidence of visceral metastasis or nodal metastasis. 4. No evidence of local prostate carcinoma recurrence in the prostate bed. 5.  Aortic Atherosclerosis (ICD10-I70.0). Electronically Signed   By: Genevive Bi M.D.   On: 03/22/2023 14:54

## 2023-06-14 NOTE — Telephone Encounter (Signed)
 Kya from [facility] called to cancel the patient's angio on 06/15/23 because the patient has been sent to the ED.

## 2023-06-14 NOTE — ED Notes (Signed)
 Linens and gown changed, purwick placed

## 2023-06-14 NOTE — Assessment & Plan Note (Addendum)
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   PSA nadir 0.01. PMSA showed new bone lesions, s/p palliative radiation.  PSA 3.29 --> 4.72-->0.9 -->0.16-->0.17-->4.97 Labs are reviewed and discussed with patient. Eligard 45 mg Q6 months with his PCP at [facility]- Continue Olarparib 250mg  BID [ dose reduced due to performance status].  Patient will get medication through [facility].   PSMA PET showed multiple bone metastatic disease.- disease progression.  Patient's sister has decided not to proceed with Pluvicto or Radiam 223 treatments. Today's hemoglobin has decreased.  Increased nucleated RBC.  I suspect that patient has bone marrow involvement of prostate cancer.  Olaparib may also attribute to low hemoglobin.  Recommend patient to stop Prognosis is poor and his condition has rapidly declined..  Recommend hospice.

## 2023-06-14 NOTE — Assessment & Plan Note (Signed)
 Recommend patient to go to emergency room for evaluation with blood transfusion

## 2023-06-15 ENCOUNTER — Ambulatory Visit
Admission: RE | Admit: 2023-06-15 | Payer: Medicare (Managed Care) | Source: Home / Self Care | Admitting: Vascular Surgery

## 2023-06-15 ENCOUNTER — Encounter: Admission: RE | Payer: Self-pay | Source: Home / Self Care

## 2023-06-15 DIAGNOSIS — I739 Peripheral vascular disease, unspecified: Secondary | ICD-10-CM

## 2023-06-15 SURGERY — LOWER EXTREMITY INTERVENTION
Anesthesia: Moderate Sedation | Site: Leg Lower | Laterality: Right

## 2023-06-15 NOTE — ED Notes (Signed)
 [facility] called to transport patient back to [facility] spoke with Victorino Dike one patient ahead of him

## 2023-06-16 ENCOUNTER — Emergency Department: Payer: Medicare (Managed Care)

## 2023-06-16 ENCOUNTER — Inpatient Hospital Stay
Admission: EM | Admit: 2023-06-16 | Discharge: 2023-06-22 | DRG: 871 | Disposition: A | Discharge status: Skilled Nursing Facility | Payer: Medicare (Managed Care) | Source: Skilled Nursing Facility | Attending: Internal Medicine | Admitting: Internal Medicine

## 2023-06-16 ENCOUNTER — Other Ambulatory Visit: Payer: Self-pay

## 2023-06-16 DIAGNOSIS — Z800 Family history of malignant neoplasm of digestive organs: Secondary | ICD-10-CM

## 2023-06-16 DIAGNOSIS — Z833 Family history of diabetes mellitus: Secondary | ICD-10-CM

## 2023-06-16 DIAGNOSIS — D649 Anemia, unspecified: Secondary | ICD-10-CM | POA: Diagnosis present

## 2023-06-16 DIAGNOSIS — Z7901 Long term (current) use of anticoagulants: Secondary | ICD-10-CM

## 2023-06-16 DIAGNOSIS — Z8546 Personal history of malignant neoplasm of prostate: Secondary | ICD-10-CM

## 2023-06-16 DIAGNOSIS — Z95810 Presence of automatic (implantable) cardiac defibrillator: Secondary | ICD-10-CM | POA: Diagnosis present

## 2023-06-16 DIAGNOSIS — Z79899 Other long term (current) drug therapy: Secondary | ICD-10-CM

## 2023-06-16 DIAGNOSIS — I5022 Chronic systolic (congestive) heart failure: Secondary | ICD-10-CM | POA: Diagnosis present

## 2023-06-16 DIAGNOSIS — Z794 Long term (current) use of insulin: Secondary | ICD-10-CM

## 2023-06-16 DIAGNOSIS — R1310 Dysphagia, unspecified: Secondary | ICD-10-CM | POA: Diagnosis present

## 2023-06-16 DIAGNOSIS — Z86711 Personal history of pulmonary embolism: Secondary | ICD-10-CM

## 2023-06-16 DIAGNOSIS — E1165 Type 2 diabetes mellitus with hyperglycemia: Secondary | ICD-10-CM

## 2023-06-16 DIAGNOSIS — D849 Immunodeficiency, unspecified: Secondary | ICD-10-CM | POA: Diagnosis present

## 2023-06-16 DIAGNOSIS — N3001 Acute cystitis with hematuria: Secondary | ICD-10-CM

## 2023-06-16 DIAGNOSIS — Z7189 Other specified counseling: Secondary | ICD-10-CM

## 2023-06-16 DIAGNOSIS — G9341 Metabolic encephalopathy: Secondary | ICD-10-CM | POA: Diagnosis present

## 2023-06-16 DIAGNOSIS — Z9842 Cataract extraction status, left eye: Secondary | ICD-10-CM

## 2023-06-16 DIAGNOSIS — I110 Hypertensive heart disease with heart failure: Secondary | ICD-10-CM | POA: Diagnosis present

## 2023-06-16 DIAGNOSIS — Z8249 Family history of ischemic heart disease and other diseases of the circulatory system: Secondary | ICD-10-CM

## 2023-06-16 DIAGNOSIS — E785 Hyperlipidemia, unspecified: Secondary | ICD-10-CM | POA: Diagnosis present

## 2023-06-16 DIAGNOSIS — R7401 Elevation of levels of liver transaminase levels: Secondary | ICD-10-CM | POA: Diagnosis present

## 2023-06-16 DIAGNOSIS — Z807 Family history of other malignant neoplasms of lymphoid, hematopoietic and related tissues: Secondary | ICD-10-CM

## 2023-06-16 DIAGNOSIS — Z515 Encounter for palliative care: Secondary | ICD-10-CM

## 2023-06-16 DIAGNOSIS — N390 Urinary tract infection, site not specified: Secondary | ICD-10-CM | POA: Diagnosis present

## 2023-06-16 DIAGNOSIS — A419 Sepsis, unspecified organism: Principal | ICD-10-CM | POA: Diagnosis present

## 2023-06-16 DIAGNOSIS — Z803 Family history of malignant neoplasm of breast: Secondary | ICD-10-CM

## 2023-06-16 DIAGNOSIS — Z87891 Personal history of nicotine dependence: Secondary | ICD-10-CM

## 2023-06-16 DIAGNOSIS — D509 Iron deficiency anemia, unspecified: Secondary | ICD-10-CM | POA: Diagnosis present

## 2023-06-16 DIAGNOSIS — Z8619 Personal history of other infectious and parasitic diseases: Secondary | ICD-10-CM

## 2023-06-16 DIAGNOSIS — Z86718 Personal history of other venous thrombosis and embolism: Secondary | ICD-10-CM

## 2023-06-16 DIAGNOSIS — C61 Malignant neoplasm of prostate: Secondary | ICD-10-CM | POA: Diagnosis present

## 2023-06-16 DIAGNOSIS — G309 Alzheimer's disease, unspecified: Secondary | ICD-10-CM | POA: Diagnosis present

## 2023-06-16 DIAGNOSIS — Z9841 Cataract extraction status, right eye: Secondary | ICD-10-CM

## 2023-06-16 DIAGNOSIS — D630 Anemia in neoplastic disease: Secondary | ICD-10-CM | POA: Diagnosis present

## 2023-06-16 DIAGNOSIS — Z66 Do not resuscitate: Secondary | ICD-10-CM | POA: Diagnosis present

## 2023-06-16 DIAGNOSIS — F0280 Dementia in other diseases classified elsewhere without behavioral disturbance: Secondary | ICD-10-CM | POA: Diagnosis present

## 2023-06-16 DIAGNOSIS — Z7984 Long term (current) use of oral hypoglycemic drugs: Secondary | ICD-10-CM

## 2023-06-16 DIAGNOSIS — R7402 Elevation of levels of lactic acid dehydrogenase (LDH): Secondary | ICD-10-CM | POA: Diagnosis present

## 2023-06-16 DIAGNOSIS — I10 Essential (primary) hypertension: Secondary | ICD-10-CM | POA: Diagnosis present

## 2023-06-16 DIAGNOSIS — Z961 Presence of intraocular lens: Secondary | ICD-10-CM | POA: Diagnosis present

## 2023-06-16 DIAGNOSIS — R4182 Altered mental status, unspecified: Secondary | ICD-10-CM

## 2023-06-16 DIAGNOSIS — C7951 Secondary malignant neoplasm of bone: Secondary | ICD-10-CM | POA: Diagnosis present

## 2023-06-16 LAB — URINALYSIS, W/ REFLEX TO CULTURE (INFECTION SUSPECTED)
Bacteria, UA: NONE SEEN
Bilirubin Urine: NEGATIVE
Glucose, UA: NEGATIVE mg/dL
Hgb urine dipstick: NEGATIVE
Ketones, ur: NEGATIVE mg/dL
Nitrite: NEGATIVE
Protein, ur: 30 mg/dL — AB
Specific Gravity, Urine: 1.018 (ref 1.005–1.030)
WBC, UA: >50 WBC/hpf (ref 0–5)
pH: 5 (ref 5.0–8.0)

## 2023-06-16 LAB — CBC WITH DIFFERENTIAL/PLATELET
Abs Immature Granulocytes: 0.05 10*3/uL (ref 0.00–0.07)
Basophils Absolute: 0 10*3/uL (ref 0.0–0.1)
Basophils Relative: 0 %
Eosinophils Absolute: 0 10*3/uL (ref 0.0–0.5)
Eosinophils Relative: 1 %
HCT: 22.4 % — ABNORMAL LOW (ref 39.0–52.0)
Hemoglobin: 7.2 g/dL — ABNORMAL LOW (ref 13.0–17.0)
Immature Granulocytes: 1 %
Lymphocytes Relative: 9 %
Lymphs Abs: 0.4 10*3/uL — ABNORMAL LOW (ref 0.7–4.0)
MCH: 31.3 pg (ref 26.0–34.0)
MCHC: 32.1 g/dL (ref 30.0–36.0)
MCV: 97.4 fL (ref 80.0–100.0)
Monocytes Absolute: 0.3 10*3/uL (ref 0.1–1.0)
Monocytes Relative: 7 %
Neutro Abs: 3.7 10*3/uL (ref 1.7–7.7)
Neutrophils Relative %: 82 %
Platelets: 302 10*3/uL (ref 150–400)
RBC: 2.3 MIL/uL — ABNORMAL LOW (ref 4.22–5.81)
RDW: 21.1 % — ABNORMAL HIGH (ref 11.5–15.5)
Smear Review: NORMAL
WBC: 4.6 10*3/uL (ref 4.0–10.5)
nRBC: 1.3 % — ABNORMAL HIGH (ref 0.0–0.2)

## 2023-06-16 LAB — COMPREHENSIVE METABOLIC PANEL WITH GFR
ALT: 11 U/L (ref 0–44)
AST: 24 U/L (ref 15–41)
Albumin: 2.6 g/dL — ABNORMAL LOW (ref 3.5–5.0)
Alkaline Phosphatase: 409 U/L — ABNORMAL HIGH (ref 38–126)
Anion gap: 12 (ref 5–15)
BUN: 28 mg/dL — ABNORMAL HIGH (ref 8–23)
CO2: 19 mmol/L — ABNORMAL LOW (ref 22–32)
Calcium: 8.1 mg/dL — ABNORMAL LOW (ref 8.9–10.3)
Chloride: 101 mmol/L (ref 98–111)
Creatinine, Ser: 1.03 mg/dL (ref 0.61–1.24)
GFR, Estimated: >60 mL/min (ref >60)
Glucose, Bld: 247 mg/dL — ABNORMAL HIGH (ref 70–99)
Potassium: 4.3 mmol/L (ref 3.5–5.1)
Sodium: 132 mmol/L — ABNORMAL LOW (ref 135–145)
Total Bilirubin: 0.6 mg/dL (ref 0.0–1.2)
Total Protein: 6.5 g/dL (ref 6.5–8.1)

## 2023-06-16 LAB — RESP PANEL BY RT-PCR (RSV, FLU A&B, COVID)  RVPGX2
Influenza A by PCR: NEGATIVE
Influenza B by PCR: NEGATIVE
Resp Syncytial Virus by PCR: NEGATIVE
SARS Coronavirus 2 by RT PCR: NEGATIVE

## 2023-06-16 LAB — LACTIC ACID, PLASMA
Lactic Acid, Venous: 2 mmol/L (ref 0.5–1.9)
Lactic Acid, Venous: 2.5 mmol/L (ref 0.5–1.9)

## 2023-06-16 LAB — PROTIME-INR
INR: 1.4 — ABNORMAL HIGH (ref 0.8–1.2)
Prothrombin Time: 16.9 s — ABNORMAL HIGH (ref 11.4–15.2)

## 2023-06-16 MED ORDER — SODIUM CHLORIDE 0.9 % IV BOLUS
1000.0000 mL | Freq: Once | INTRAVENOUS | Status: AC
  Administered 2023-06-16: 1000 mL via INTRAVENOUS

## 2023-06-16 MED ORDER — SODIUM CHLORIDE 0.9 % IV SOLN
2.0000 g | Freq: Once | INTRAVENOUS | Status: AC
  Administered 2023-06-16: 2 g via INTRAVENOUS
  Filled 2023-06-16: qty 12.5

## 2023-06-16 MED ORDER — LACTATED RINGERS IV BOLUS (SEPSIS)
1000.0000 mL | Freq: Once | INTRAVENOUS | Status: AC
  Administered 2023-06-16: 1000 mL via INTRAVENOUS

## 2023-06-16 MED ORDER — METRONIDAZOLE 500 MG/100ML IV SOLN
500.0000 mg | Freq: Once | INTRAVENOUS | Status: AC
  Administered 2023-06-16: 500 mg via INTRAVENOUS
  Filled 2023-06-16: qty 100

## 2023-06-16 MED ORDER — VANCOMYCIN HCL IN DEXTROSE 1-5 GM/200ML-% IV SOLN
1000.0000 mg | Freq: Once | INTRAVENOUS | Status: AC
  Administered 2023-06-16: 1000 mg via INTRAVENOUS
  Filled 2023-06-16: qty 200

## 2023-06-16 MED ORDER — LACTATED RINGERS IV SOLN: INTRAVENOUS | Status: AC

## 2023-06-16 NOTE — Sepsis Progress Note (Signed)
 Code Sepsis protocol being monitored by eLink.

## 2023-06-16 NOTE — Progress Notes (Signed)
 CODE SEPSIS - PHARMACY COMMUNICATION  **Broad Spectrum Antibiotics should be administered within 1 hour of Sepsis diagnosis**  Time Code Sepsis Called/Page Received: 1749  Antibiotics Ordered: cefepime, metronidazole, and vancomycin  Time of 1st antibiotic administration: 1856  Additional action taken by pharmacy: Messaged RN - antibiotics delayed due to waiting on provider to follow-up hospice status with patient's family  If necessary, Name of Provider/Nurse Contacted: None   Matthew Mcgrath ,PharmD Clinical Pharmacist  06/16/2023  6:00 PM

## 2023-06-16 NOTE — ED Notes (Signed)
 Dr Anner Crete wanted to confirm pt is not currently on hospice because he had read something in a previous note. Dr wanted confirmation on what treatment the POA wanted before this RN started fluids and antibiotics.

## 2023-06-16 NOTE — ED Provider Notes (Signed)
 [facility] Provider Note    Event Date/Time   First MD Initiated Contact with Patient 06/16/23 1743     (approximate)   History   Altered Mental Status and Tachycardia   HPI Matthew Mcgrath is a 78 y.o. male with history of metastatic prostate cancer, chronic anemia, cognitive impairment, HFrEF, PE on Eliquis presenting today for altered mental status and tachycardia.  At his facility he was found to be altered from his baseline.  Febrile at 102 F notably tachycardic into the 110s.  No other obvious symptoms that they noted such as cough, congestion, dysuria, nausea, vomiting.  Family denies the patient is currently on hospice.  They currently want all medical treatment.  Chart review: Patient seen in the ED 2 days ago for symptomatic anemia.  Was given 1 unit PRBC and discharged after discussion with family.     Physical Exam   Triage Vital Signs: ED Triage Vitals [06/16/23 1749]  Encounter Vitals Group     BP      Systolic BP Percentile      Diastolic BP Percentile      Pulse Rate (!) 118     Resp (!) 28     Temp      Temp src      SpO2 100 %     Weight      Height      Head Circumference      Peak Flow      Pain Score 0     Pain Loc      Pain Education      Exclude from Growth Chart     Most recent vital signs: Vitals:   06/16/23 1849 06/16/23 1921  BP:    Pulse:    Resp:    Temp: 98.6 F (37 C) 98.7 F (37.1 C)  SpO2:     Physical Exam: I have reviewed the vital signs and nursing notes. General: Awake, alert, no acute distress.  Nontoxic appearing. Head:  Atraumatic, normocephalic.   ENT:  EOM intact, PERRL. Oral mucosa is pink and moist with no lesions. Neck: Neck is supple with full range of motion, No meningeal signs. Cardiovascular:  tachycardia, RR, No murmurs. Peripheral pulses palpable and equal bilaterally. Respiratory:  Symmetrical chest wall expansion.  No rhonchi, rales, or wheezes.  Good air movement throughout.   No use of accessory muscles.   Musculoskeletal:  No cyanosis or edema. Moving extremities with full ROM Abdomen:  Soft, nontender, nondistended. Neuro:  GCS 12, confused, moving all four extremities, interacting appropriately. Speech clear. Psych:  Calm, appropriate.   Skin:  Warm, dry, no rash.    ED Results / Procedures / Treatments   Labs (all labs ordered are listed, but only abnormal results are displayed) Labs Reviewed  LACTIC ACID, PLASMA - Abnormal; Notable for the following components:      Result Value   Lactic Acid, Venous 2.0 (*)    All other components within normal limits  COMPREHENSIVE METABOLIC PANEL WITH GFR - Abnormal; Notable for the following components:   Sodium 132 (*)    CO2 19 (*)    Glucose, Bld 247 (*)    BUN 28 (*)    Calcium 8.1 (*)    Albumin 2.6 (*)    Alkaline Phosphatase 409 (*)    All other components within normal limits  CBC WITH DIFFERENTIAL/PLATELET - Abnormal; Notable for the following components:   RBC 2.30 (*)    Hemoglobin  7.2 (*)    HCT 22.4 (*)    RDW 21.1 (*)    nRBC 1.3 (*)    Lymphs Abs 0.4 (*)    All other components within normal limits  PROTIME-INR - Abnormal; Notable for the following components:   Prothrombin Time 16.9 (*)    INR 1.4 (*)    All other components within normal limits  URINALYSIS, W/ REFLEX TO CULTURE (INFECTION SUSPECTED) - Abnormal; Notable for the following components:   Color, Urine YELLOW (*)    APPearance HAZY (*)    Protein, ur 30 (*)    Leukocytes,Ua SMALL (*)    All other components within normal limits  RESP PANEL BY RT-PCR (RSV, FLU A&B, COVID)  RVPGX2  CULTURE, BLOOD (ROUTINE X 2)  CULTURE, BLOOD (ROUTINE X 2)  URINE CULTURE  LACTIC ACID, PLASMA  PATHOLOGIST SMEAR REVIEW     EKG My EKG interpretation: Rate of 118, sinus tachycardia, normal axis, QTc prolonged at 562.  No acute ST elevations or depressions   RADIOLOGY Independently interpreted chest x-ray and CT head with no acute  pathology   PROCEDURES:  Critical Care performed: Yes, see critical care procedure note(s)  .Critical Care  Performed by: Janith Lima, MD Authorized by: Janith Lima, MD   Critical care provider statement:    Critical care time (minutes):  30   Critical care was necessary to treat or prevent imminent or life-threatening deterioration of the following conditions:  Sepsis   Critical care was time spent personally by me on the following activities:  Development of treatment plan with patient or surrogate, discussions with consultants, evaluation of patient's response to treatment, examination of patient, ordering and review of laboratory studies, ordering and review of radiographic studies, ordering and performing treatments and interventions, pulse oximetry, re-evaluation of patient's condition and review of old charts   I assumed direction of critical care for this patient from another provider in my specialty: no     Care discussed with: admitting provider      MEDICATIONS ORDERED IN ED: Medications  lactated ringers infusion (has no administration in time range)  metroNIDAZOLE (FLAGYL) IVPB 500 mg (500 mg Intravenous New Bag/Given 06/16/23 2058)  vancomycin (VANCOCIN) IVPB 1000 mg/200 mL premix (1,000 mg Intravenous New Bag/Given 06/16/23 2058)  lactated ringers bolus 1,000 mL (1,000 mLs Intravenous New Bag/Given 06/16/23 1858)  ceFEPIme (MAXIPIME) 2 g in sodium chloride 0.9 % 100 mL IVPB (0 g Intravenous Stopped 06/16/23 2046)     IMPRESSION / MDM / ASSESSMENT AND PLAN / ED COURSE  I reviewed the triage vital signs and the nursing notes.                              Differential diagnosis includes, but is not limited to, acute cystitis, pneumonia, COVID, flu, RSV, acute intracranial abnormality, dehydration, anemia  Patient's presentation is most consistent with acute presentation with potential threat to life or bodily function.  Patient is a 78 year old male presenting  today for altered mental status with tachycardia.  Was febrile at his facility and has already received Tylenol.  He meets sepsis criteria given his fever, tachycardia, and tachypnea.  He is altered on exam with confusion but otherwise no acute distress.  Nothing else obvious on exam otherwise.  Started on broad-spectrum antibiotics as well as fluids.  Lactic acid of 2.0.  No leukocytosis.  Anemia consistent with his baseline.  Negative for COVID, flu,  RSV.  Negative chest x-ray and CT head unremarkable.  Ultimately found to be positive for likely UTI as a source of his symptoms today.  Will admit to hospitalist for sepsis secondary to suspected UTI.  The patient is on the cardiac monitor to evaluate for evidence of arrhythmia and/or significant heart rate changes. Clinical Course as of 06/16/23 2120  Fri Jun 16, 2023  1803 Hemoglobin(!): 7.2 History of chronic anemia slightly improved from 2 days ago where he got 1 unit PRBC [DW]    Clinical Course User Index [DW] Janith Lima, MD     FINAL CLINICAL IMPRESSION(S) / ED DIAGNOSES   Final diagnoses:  Sepsis, due to unspecified organism, unspecified whether acute organ dysfunction present [facility])  Acute cystitis with hematuria  Altered mental status, unspecified altered mental status type     Rx / DC Orders   ED Discharge Orders     None        Note:  This document was prepared using Dragon voice recognition software and may include unintentional dictation errors.   Janith Lima, MD 06/16/23 2122

## 2023-06-16 NOTE — Sepsis Progress Note (Signed)
Notified bedside nurse of need to administer antibiotics and fluid bolus.  

## 2023-06-16 NOTE — ED Triage Notes (Signed)
 Pt arrives via EMS from [facility] for tachycardia. Hx of afib. Pt was altered with EMS. EMS found pt to open eyes to verbal stimuli but not respond. Pt has tremors at baseline.   EMS vitals: 125 HR 124/86 BP 329 CBG 102.75F temp then 99.1 after facility gave 1 g of tylenol

## 2023-06-17 DIAGNOSIS — I5022 Chronic systolic (congestive) heart failure: Secondary | ICD-10-CM | POA: Diagnosis present

## 2023-06-17 DIAGNOSIS — I110 Hypertensive heart disease with heart failure: Secondary | ICD-10-CM | POA: Diagnosis present

## 2023-06-17 DIAGNOSIS — D630 Anemia in neoplastic disease: Secondary | ICD-10-CM | POA: Diagnosis present

## 2023-06-17 DIAGNOSIS — D849 Immunodeficiency, unspecified: Secondary | ICD-10-CM | POA: Diagnosis present

## 2023-06-17 DIAGNOSIS — Z794 Long term (current) use of insulin: Secondary | ICD-10-CM | POA: Diagnosis not present

## 2023-06-17 DIAGNOSIS — F0280 Dementia in other diseases classified elsewhere without behavioral disturbance: Secondary | ICD-10-CM | POA: Diagnosis present

## 2023-06-17 DIAGNOSIS — G9341 Metabolic encephalopathy: Secondary | ICD-10-CM | POA: Diagnosis present

## 2023-06-17 DIAGNOSIS — E785 Hyperlipidemia, unspecified: Secondary | ICD-10-CM | POA: Diagnosis present

## 2023-06-17 DIAGNOSIS — N390 Urinary tract infection, site not specified: Secondary | ICD-10-CM | POA: Diagnosis present

## 2023-06-17 DIAGNOSIS — Z8249 Family history of ischemic heart disease and other diseases of the circulatory system: Secondary | ICD-10-CM | POA: Diagnosis not present

## 2023-06-17 DIAGNOSIS — R6520 Severe sepsis without septic shock: Secondary | ICD-10-CM | POA: Diagnosis not present

## 2023-06-17 DIAGNOSIS — Z95810 Presence of automatic (implantable) cardiac defibrillator: Secondary | ICD-10-CM | POA: Diagnosis not present

## 2023-06-17 DIAGNOSIS — E1165 Type 2 diabetes mellitus with hyperglycemia: Secondary | ICD-10-CM

## 2023-06-17 DIAGNOSIS — D509 Iron deficiency anemia, unspecified: Secondary | ICD-10-CM | POA: Diagnosis present

## 2023-06-17 DIAGNOSIS — A419 Sepsis, unspecified organism: Secondary | ICD-10-CM | POA: Diagnosis present

## 2023-06-17 DIAGNOSIS — Z7984 Long term (current) use of oral hypoglycemic drugs: Secondary | ICD-10-CM | POA: Diagnosis not present

## 2023-06-17 DIAGNOSIS — Z86718 Personal history of other venous thrombosis and embolism: Secondary | ICD-10-CM | POA: Diagnosis not present

## 2023-06-17 DIAGNOSIS — Z87891 Personal history of nicotine dependence: Secondary | ICD-10-CM | POA: Diagnosis not present

## 2023-06-17 DIAGNOSIS — N3001 Acute cystitis with hematuria: Secondary | ICD-10-CM | POA: Diagnosis present

## 2023-06-17 DIAGNOSIS — G309 Alzheimer's disease, unspecified: Secondary | ICD-10-CM | POA: Diagnosis present

## 2023-06-17 DIAGNOSIS — Z66 Do not resuscitate: Secondary | ICD-10-CM | POA: Diagnosis present

## 2023-06-17 DIAGNOSIS — R1310 Dysphagia, unspecified: Secondary | ICD-10-CM | POA: Diagnosis present

## 2023-06-17 DIAGNOSIS — C7951 Secondary malignant neoplasm of bone: Secondary | ICD-10-CM | POA: Diagnosis present

## 2023-06-17 DIAGNOSIS — Z86711 Personal history of pulmonary embolism: Secondary | ICD-10-CM | POA: Diagnosis not present

## 2023-06-17 DIAGNOSIS — Z7901 Long term (current) use of anticoagulants: Secondary | ICD-10-CM | POA: Diagnosis not present

## 2023-06-17 DIAGNOSIS — Z8619 Personal history of other infectious and parasitic diseases: Secondary | ICD-10-CM

## 2023-06-17 DIAGNOSIS — Z8546 Personal history of malignant neoplasm of prostate: Secondary | ICD-10-CM | POA: Diagnosis not present

## 2023-06-17 DIAGNOSIS — Z515 Encounter for palliative care: Secondary | ICD-10-CM | POA: Diagnosis not present

## 2023-06-17 LAB — CBC
HCT: 21.9 % — ABNORMAL LOW (ref 39.0–52.0)
HCT: 25.2 % — ABNORMAL LOW (ref 39.0–52.0)
Hemoglobin: 6.8 g/dL — ABNORMAL LOW (ref 13.0–17.0)
Hemoglobin: 8.1 g/dL — ABNORMAL LOW (ref 13.0–17.0)
MCH: 30.5 pg (ref 26.0–34.0)
MCH: 30.3 pg (ref 26.0–34.0)
MCHC: 31.1 g/dL (ref 30.0–36.0)
MCHC: 32.1 g/dL (ref 30.0–36.0)
MCV: 98.2 fL (ref 80.0–100.0)
MCV: 94.4 fL (ref 80.0–100.0)
Platelets: 232 10*3/uL (ref 150–400)
Platelets: 231 10*3/uL (ref 150–400)
RBC: 2.23 MIL/uL — ABNORMAL LOW (ref 4.22–5.81)
RBC: 2.67 MIL/uL — ABNORMAL LOW (ref 4.22–5.81)
RDW: 19.6 % — ABNORMAL HIGH (ref 11.5–15.5)
RDW: 19.1 % — ABNORMAL HIGH (ref 11.5–15.5)
WBC: 4 10*3/uL (ref 4.0–10.5)
WBC: 4.6 10*3/uL (ref 4.0–10.5)
nRBC: 1.2 % — ABNORMAL HIGH (ref 0.0–0.2)
nRBC: 1.7 % — ABNORMAL HIGH (ref 0.0–0.2)

## 2023-06-17 LAB — GLUCOSE, CAPILLARY
Glucose-Capillary: 241 mg/dL — ABNORMAL HIGH (ref 70–99)
Glucose-Capillary: 134 mg/dL — ABNORMAL HIGH (ref 70–99)
Glucose-Capillary: 236 mg/dL — ABNORMAL HIGH (ref 70–99)
Glucose-Capillary: 134 mg/dL — ABNORMAL HIGH (ref 70–99)
Glucose-Capillary: 219 mg/dL — ABNORMAL HIGH (ref 70–99)
Glucose-Capillary: 153 mg/dL — ABNORMAL HIGH (ref 70–99)

## 2023-06-17 LAB — TYPE AND SCREEN
ABO/RH(D): O POS
Antibody Screen: NEGATIVE
Unit division: 0
Unit division: 0

## 2023-06-17 LAB — BASIC METABOLIC PANEL WITH GFR
Anion gap: 8 (ref 5–15)
BUN: 18 mg/dL (ref 8–23)
CO2: 23 mmol/L (ref 22–32)
Calcium: 8.1 mg/dL — ABNORMAL LOW (ref 8.9–10.3)
Chloride: 103 mmol/L (ref 98–111)
Creatinine, Ser: 0.78 mg/dL (ref 0.61–1.24)
GFR, Estimated: >60 mL/min (ref >60)
Glucose, Bld: 188 mg/dL — ABNORMAL HIGH (ref 70–99)
Potassium: 4.1 mmol/L (ref 3.5–5.1)
Sodium: 134 mmol/L — ABNORMAL LOW (ref 135–145)

## 2023-06-17 LAB — BPAM RBC
Blood Product Expiration Date: 202505022359
Blood Product Expiration Date: 202505022359
ISSUE DATE / TIME: 202504092156
ISSUE DATE / TIME: 202504121446
Unit Type and Rh: 5100
Unit Type and Rh: 5100

## 2023-06-17 LAB — PREPARE RBC (CROSSMATCH)

## 2023-06-17 MED ORDER — ACETAMINOPHEN 325 MG PO TABS
650.0000 mg | ORAL_TABLET | Freq: Four times a day (QID) | ORAL | Status: DC | PRN
  Administered 2023-06-21: 650 mg via ORAL
  Filled 2023-06-17: qty 2

## 2023-06-17 MED ORDER — PRO-STAT AWC PO LIQD
30.0000 mL | Freq: Two times a day (BID) | ORAL | Status: DC
  Administered 2023-06-17 – 2023-06-20 (×2): 30 mL via ORAL
  Filled 2023-06-17: qty 30

## 2023-06-17 MED ORDER — GLUCERNA PO LIQD
237.0000 mL | Freq: Two times a day (BID) | ORAL | Status: DC
  Administered 2023-06-17 – 2023-06-22 (×11): 237 mL via ORAL
  Filled 2023-06-17: qty 237

## 2023-06-17 MED ORDER — SODIUM CHLORIDE 0.9 % IV SOLN
2.0000 g | Freq: Two times a day (BID) | INTRAVENOUS | Status: DC
  Administered 2023-06-17: 2 g via INTRAVENOUS
  Filled 2023-06-17 (×3): qty 12.5

## 2023-06-17 MED ORDER — CARVEDILOL 6.25 MG PO TABS
3.1250 mg | ORAL_TABLET | Freq: Two times a day (BID) | ORAL | Status: DC
  Administered 2023-06-17 – 2023-06-22 (×11): 3.125 mg via ORAL
  Filled 2023-06-17 (×11): qty 1

## 2023-06-17 MED ORDER — LACTATED RINGERS IV SOLN
150.0000 mL/h | INTRAVENOUS | Status: AC
  Administered 2023-06-17: 150 mL/h via INTRAVENOUS

## 2023-06-17 MED ORDER — APIXABAN 5 MG PO TABS
5.0000 mg | ORAL_TABLET | Freq: Two times a day (BID) | ORAL | Status: DC
  Administered 2023-06-17 – 2023-06-22 (×12): 5 mg via ORAL
  Filled 2023-06-17 (×12): qty 1

## 2023-06-17 MED ORDER — TAMSULOSIN HCL 0.4 MG PO CAPS
0.4000 mg | ORAL_CAPSULE | Freq: Every evening | ORAL | Status: DC
  Administered 2023-06-17 – 2023-06-21 (×6): 0.4 mg via ORAL
  Filled 2023-06-17 (×6): qty 1

## 2023-06-17 MED ORDER — ACETAMINOPHEN 325 MG RE SUPP: 650.0000 mg | Freq: Four times a day (QID) | RECTAL | Status: DC | PRN

## 2023-06-17 MED ORDER — MORPHINE SULFATE (PF) 2 MG/ML IV SOLN: 2.0000 mg | INTRAVENOUS | Status: DC | PRN

## 2023-06-17 MED ORDER — INSULIN GLARGINE-YFGN 100 UNIT/ML ~~LOC~~ SOLN
15.0000 [IU] | Freq: Every day | SUBCUTANEOUS | Status: DC
  Administered 2023-06-17 – 2023-06-21 (×6): 15 [IU] via SUBCUTANEOUS
  Filled 2023-06-17 (×7): qty 0.15

## 2023-06-17 MED ORDER — ONDANSETRON HCL 4 MG/2ML IJ SOLN: 4.0000 mg | Freq: Four times a day (QID) | INTRAMUSCULAR | Status: DC | PRN

## 2023-06-17 MED ORDER — VANCOMYCIN HCL 500 MG/100ML IV SOLN
500.0000 mg | Freq: Once | INTRAVENOUS | Status: AC
  Administered 2023-06-17: 500 mg via INTRAVENOUS
  Filled 2023-06-17: qty 100

## 2023-06-17 MED ORDER — INSULIN ASPART 100 UNIT/ML IJ SOLN
0.0000 [IU] | Freq: Every day | INTRAMUSCULAR | Status: DC
  Administered 2023-06-17: 2 [IU] via SUBCUTANEOUS
  Administered 2023-06-20: 4 [IU] via SUBCUTANEOUS
  Administered 2023-06-21: 2 [IU] via SUBCUTANEOUS
  Filled 2023-06-17 (×3): qty 1

## 2023-06-17 MED ORDER — INSULIN ASPART 100 UNIT/ML IJ SOLN
0.0000 [IU] | Freq: Three times a day (TID) | INTRAMUSCULAR | Status: DC
  Administered 2023-06-17: 1 [IU] via SUBCUTANEOUS
  Administered 2023-06-17: 3 [IU] via SUBCUTANEOUS
  Administered 2023-06-17 – 2023-06-18 (×2): 1 [IU] via SUBCUTANEOUS
  Administered 2023-06-18 (×2): 2 [IU] via SUBCUTANEOUS
  Administered 2023-06-19: 1 [IU] via SUBCUTANEOUS
  Administered 2023-06-19: 3 [IU] via SUBCUTANEOUS
  Administered 2023-06-20: 1 [IU] via SUBCUTANEOUS
  Administered 2023-06-20: 5 [IU] via SUBCUTANEOUS
  Administered 2023-06-20: 7 [IU] via SUBCUTANEOUS
  Administered 2023-06-21: 3 [IU] via SUBCUTANEOUS
  Administered 2023-06-21: 5 [IU] via SUBCUTANEOUS
  Administered 2023-06-21: 2 [IU] via SUBCUTANEOUS
  Administered 2023-06-22: 3 [IU] via SUBCUTANEOUS
  Administered 2023-06-22: 2 [IU] via SUBCUTANEOUS
  Filled 2023-06-17 (×16): qty 1

## 2023-06-17 MED ORDER — ONDANSETRON HCL 4 MG PO TABS: 4.0000 mg | ORAL_TABLET | Freq: Four times a day (QID) | ORAL | Status: DC | PRN

## 2023-06-17 MED ORDER — METRONIDAZOLE 500 MG/100ML IV SOLN
500.0000 mg | Freq: Two times a day (BID) | INTRAVENOUS | Status: DC
  Administered 2023-06-17: 500 mg via INTRAVENOUS
  Filled 2023-06-17 (×2): qty 100

## 2023-06-17 MED ORDER — SODIUM CHLORIDE 0.9 % IV SOLN
1.0000 g | Freq: Three times a day (TID) | INTRAVENOUS | Status: DC
  Administered 2023-06-17 – 2023-06-19 (×7): 1 g via INTRAVENOUS
  Filled 2023-06-17 (×8): qty 20

## 2023-06-17 MED ORDER — VITAMIN D 25 MCG (1000 UNIT) PO TABS
1000.0000 [IU] | ORAL_TABLET | Freq: Every day | ORAL | Status: DC
  Administered 2023-06-17 – 2023-06-22 (×6): 1000 [IU] via ORAL
  Filled 2023-06-17 (×6): qty 1

## 2023-06-17 MED ORDER — POLYETHYLENE GLYCOL 3350 17 G PO PACK
17.0000 g | PACK | Freq: Every day | ORAL | Status: DC
  Administered 2023-06-17 – 2023-06-22 (×6): 17 g via ORAL
  Filled 2023-06-17 (×6): qty 1

## 2023-06-17 MED ORDER — PANTOPRAZOLE SODIUM 40 MG PO TBEC
40.0000 mg | DELAYED_RELEASE_TABLET | Freq: Every day | ORAL | Status: DC
  Administered 2023-06-17 – 2023-06-22 (×6): 40 mg via ORAL
  Filled 2023-06-17 (×6): qty 1

## 2023-06-17 MED ORDER — GABAPENTIN 100 MG PO CAPS
200.0000 mg | ORAL_CAPSULE | Freq: Two times a day (BID) | ORAL | Status: DC
  Administered 2023-06-17 – 2023-06-22 (×12): 200 mg via ORAL
  Filled 2023-06-17 (×12): qty 2

## 2023-06-17 MED ORDER — VANCOMYCIN HCL 1250 MG/250ML IV SOLN
1250.0000 mg | INTRAVENOUS | Status: DC
  Administered 2023-06-17 – 2023-06-19 (×3): 1250 mg via INTRAVENOUS
  Filled 2023-06-17 (×4): qty 250

## 2023-06-17 MED ORDER — SODIUM CHLORIDE 0.9% IV SOLUTION: Freq: Once | INTRAVENOUS | Status: AC

## 2023-06-17 MED ORDER — MAGNESIUM OXIDE -MG SUPPLEMENT 400 (240 MG) MG PO TABS
400.0000 mg | ORAL_TABLET | Freq: Every day | ORAL | Status: DC
  Administered 2023-06-17 – 2023-06-22 (×6): 400 mg via ORAL
  Filled 2023-06-17 (×6): qty 1

## 2023-06-17 NOTE — Assessment & Plan Note (Signed)
 Hemoglobin 7.2, up from 6.5 a few days prior No acute bleeding suspected however patient is on Eliquis-did have hematuria during his hospitalization a couple weeks prior Serial H&H

## 2023-06-17 NOTE — Assessment & Plan Note (Signed)
Secondary to sepsis Neurologic checks with fall and aspiration precautions

## 2023-06-17 NOTE — Progress Notes (Signed)
 Pharmacy Antibiotic Note  Matthew Mcgrath is a 78 y.o. male admitted on 06/16/2023 with sepsis.  Pharmacy has been consulted for Vancomycin dosing.  Plan: Vancomycin 1 gm IV X 1 given in ED on 4/11 @ 2058.  Additional Vanc 500 mg IV X 1 ordered to make total loading dose of 1500 mg.  Vancomycin 1250 mg IV Q24H ordered to start on 4/12 @ 2100.  AUC = 548.6 Vanc trough = 12.9      Temp (24hrs), Avg:98.8 F (37.1 C), Min:98.6 F (37 C), Max:98.9 F (37.2 C)  Recent Labs  Lab 06/14/23 1408 06/14/23 1801 06/16/23 1750 06/16/23 1822 06/16/23 2215  WBC 4.3 4.4 4.6  --   --   CREATININE 0.96 0.97 1.03  --   --   LATICACIDVEN  --   --   --  2.0* 2.5*    Estimated Creatinine Clearance: 53.9 mL/min (by C-G formula based on SCr of 1.03 mg/dL).    No Known Allergies  Antimicrobials this admission:   >>    >>   Dose adjustments this admission:   Microbiology results:  BCx:   UCx:    Sputum:    MRSA PCR:   Thank you for allowing pharmacy to be a part of this patient's care.  Alanii Ramer D 06/17/2023 1:56 AM

## 2023-06-17 NOTE — IPAL (Signed)
  Interdisciplinary Goals of Care Family Meeting   Date carried out: 06/17/2023  Location of the meeting: Bedside  Member's involved: Physician, Bedside Registered Nurse, Social Worker, and Family Member or next of kin  Durable Power of Attorney or acting medical decision maker: Sister Matthew Mcgrath    Discussion: We discussed goals of care for Matthew Mcgrath .   I have reviewed medical records including EPIC notes, labs and imaging, assessed the patient and then met with sister Matthew Mcgrath and Matthew Mcgrath to discuss major active diagnoses, plan of care, natural trajectory, prognosis, GOC, EOL wishes, disposition and options including Full code/DNI/DNR and the concept of comfort care if DNR is elected. Questions and concerns were addressed. They are  in agreement to continue current plan of care . Election for DNR/DNI status.   Code status:   Code Status: Limited: Do not attempt resuscitation (DNR) -DNR-LIMITED -Do Not Intubate/DNI    Disposition: Continue current acute care  Time spent for the meeting: 32    Lanetta Pion, MD  06/17/2023, 5:19 AM

## 2023-06-17 NOTE — Assessment & Plan Note (Signed)
 Delirium precautions

## 2023-06-17 NOTE — Plan of Care (Signed)

## 2023-06-17 NOTE — H&P (Signed)
 History and Physical    Patient: Matthew Mcgrath:[id number] DOB: 1945/10/31 DOA: 06/16/2023 DOS: the patient was seen and examined on 06/17/2023 PCP: [facility]  Patient coming from: SNF  Chief Complaint:  Chief Complaint  Patient presents with   Altered Mental Status   Tachycardia    HPI: Matthew Mcgrath is a 78 y.o. 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 coli UTI, being admitted from his facility with sepsis of unknown source after presenting with altered mental status described as decreased responsiveness as well as a fever of 102. ED course and data review: Afebrile upon arrival tachycardic to the 1 teens, tachypneic to the mid 20s, normotensive with O2 sats of 100% on room air. Labs significant for normal WBC but with elevated lactic acid of 2-->2.5 Respiratory viral panel negative and urinalysis unremarkable except for small leukocyte esterase. Hemoglobin 7.2 up from recent baseline of 6.5 about 3 days ago Creatinine at baseline but with mild metabolic acidosis of 19 Blood glucose 247 Elevated alk phos of 409, normal in the 500s EKG, personally viewed and interpreted showing sinus tachycardia at 118 with an occasional VPC CT head nonacute.  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     Past Medical History:  Diagnosis Date   Cardiomyopathy (HCC)    Congestive heart failure (HCC)    Diabetes mellitus type 2, controlled (HCC)    Elevated lipids    Family history of breast cancer     Family history of colon cancer    HTN (hypertension)    Past Surgical History:  Procedure Laterality Date   CARDIAC CATHETERIZATION N/A 04/14/2015   Procedure: Left Heart Cath and Coronary Angiography;  Surgeon: Percival Brace, MD;  Location: [facility];  Service: Cardiovascular;  Laterality: N/A;   CATARACT EXTRACTION W/ INTRAOCULAR LENS  IMPLANT, BILATERAL Left    COLONOSCOPY WITH PROPOFOL N/A 04/08/2020   Procedure: COLONOSCOPY WITH PROPOFOL;  Surgeon: Toledo, Alphonsus Jeans, MD;  Location: [facility];  Service: Gastroenterology;  Laterality: N/A;   CYSTOSCOPY W/ RETROGRADES Bilateral 07/02/2021   Procedure: CYSTOSCOPY WITH RETROGRADE PYELOGRAM;  Surgeon: Lawerence Pressman, MD;  Location: [facility];  Service: Urology;  Laterality: Bilateral;   HERNIA REPAIR     IMPLANTABLE CARDIOVERTER DEFIBRILLATOR (ICD) GENERATOR CHANGE Left 07/03/2015   Procedure: ICD IMPLANT single chamber;  Surgeon: Jerrie Morales, MD;  Location: [facility] OR[facility];  Service: Cardiovascular;  Laterality: Left;   TRANSURETHRAL RESECTION OF BLADDER TUMOR N/A 07/02/2021   Procedure: TRANSURETHRAL RESECTION OF BLADDER TUMOR (TURBT);  Surgeon: Lawerence Pressman, MD;  Location: [facility];  Service: Urology;  Laterality: N/A;   Social History:  reports that he quit smoking about 3 years ago. His smoking use included cigarettes. He started smoking about 33 years ago. He has a 7.5 pack-year smoking history. He has been exposed to tobacco smoke. He has never used smokeless tobacco. He reports that he does not currently use drugs after having used the following drugs: Marijuana. Frequency: 2.00 times per week. He reports that he does not drink alcohol.  No Known Allergies  Family History  Problem Relation Age of Onset   Heart failure Mother    Heart disease Mother    Alcoholism Father    Hypertension Sister    Diabetes Sister    Hypertension Brother    Diabetes Brother    Colon cancer Brother        vs prostate   Multiple  myeloma Brother    Breast cancer Cousin     Prior to Admission medications   Medication Sig Start Date End Date Taking? Authorizing Provider  acetaminophen (TYLENOL) 325 MG tablet Take 2 tablets (650 mg total) by mouth every 4 (four) hours as needed for mild pain (or temp > 37.5 C (99.5 F)). Patient taking differently: Take 1,300 mg by mouth 2 (two) times daily. 11/29/22  Yes Elgergawy, Ardia Kraft, MD  Amino Acids-Protein Hydrolys (PRO-STAT AWC) LIQD Take 30 mLs by mouth 2 (two) times daily.   Yes [provider]  carvedilol (COREG) 3.125 MG tablet Take 3.125 mg by mouth 2 (two) times daily. 10/05/21  Yes [provider]  cholecalciferol (VITAMIN D3) 25 MCG (1000 UNIT) tablet Take 1,000 Units by mouth daily.   Yes [provider]  ferrous sulfate 325 (65 FE) MG EC tablet Take 1 tablet (325 mg total) by mouth daily. 06/01/23  Yes Aisha Hove, MD  furosemide (LASIX) 40 MG tablet Take 40 mg by mouth every Monday, Tuesday, Wednesday, Thursday, and Friday. 05/26/23  Yes [provider]  gabapentin (NEURONTIN) 100 MG capsule Take 200 mg by mouth 2 (two) times daily. 05/26/23  Yes [provider]  Glucerna (GLUCERNA) LIQD Take 237 mLs by mouth 2 (two) times daily.   Yes [provider]  insulin glargine (LANTUS) 100 UNIT/ML injection Inject 22 Units into the skin at bedtime. Notify [facility] on call or Dr. Dorrene Gaucher   Yes [provider]  LEUPROLIDE ACETATE, 6 MONTH, 45 MG injection Inject 45 mg into the skin every 6 (six) months. GIVEN AT [facility] EVERY 6 MONTHS 06/16/21  Yes [provider]  LORazepam (ATIVAN) 0.5 MG tablet Take 0.25 mg by mouth at bedtime. 06/15/23  Yes [provider]  LORAZEPAM INTENSOL 2 MG/ML concentrated solution Take 0.25 mLs by mouth every 4 (four) hours as needed for anxiety (and restlessness). CALL [facility] PRIOR TO ADMINISTERING 06/15/23  Yes [provider]  magnesium oxide (MAG-OX) 400 (240 Mg) MG  tablet Take 1 tablet by mouth daily. 06/05/21  Yes [provider]  metFORMIN (GLUCOPHAGE-XR) 500 MG 24 hr tablet Take 1,000 mg by mouth 2 (two) times daily.   Yes [provider]  MIRALAX 17 GM/SCOOP powder Take 17 g by mouth daily. Mix in 8 ounces of water 06/05/23  Yes [provider]  Morphine Sulfate (MORPHINE CONCENTRATE) 10 mg / 0.5 ml concentrated solution Place 0.2 mLs under the tongue every 2 (two) hours as needed (for air hunger or respiratory distress). CALL [facility] PRIOR TO ADMINISTERING 06/15/23  Yes [provider]  Multiple Vitamin (ONE-DAILY MULTI-VITAMIN) TABS Take 1 tablet by mouth daily. 10/05/21  Yes [provider]  NOVOLOG 100 UNIT/ML injection Inject 4 Units into the skin 3 (three) times daily as needed for high blood sugar (With meals PRN, Give if blood sugar is greater than 200). 06/11/23  Yes [provider]  Omega-3 Fatty Acids (FISH OIL) 1000 MG CAPS Take 1 capsule by mouth 2 (two) times daily. 04/08/23  Yes [provider]  omeprazole (PRILOSEC) 10 MG capsule Take 10 mg by mouth daily. 11/06/22  Yes [provider]  oxyCODONE (OXY IR/ROXICODONE) 5 MG immediate release tablet Take 2.5 mg by mouth every 8 (eight) hours. Administer 2.5 mg for pain not relieved by Tylenol or Gabapentin 06/12/23  Yes [provider]  senna (SENOKOT) 8.6 MG TABS tablet Take 2 tablets by mouth every evening. 11/06/22  Yes [provider]  spironolactone (ALDACTONE) 25 MG tablet Take 0.5 tablets (12.5 mg total) by mouth daily. 06/21/21  Yes Wieting, Richard, MD  tamsulosin (FLOMAX) 0.4 MG CAPS capsule Take 1 capsule (0.4 mg total) by mouth daily. Patient taking differently: Take 0.4 mg by mouth every evening. 02/04/21  Yes Lawerence Pressman, MD  Zinc Oxide (DESITIN) 13 % CREA Apply 1 application  topically in the morning, at noon, in the evening, and at bedtime. Apply liberally to diaper/gluteal area   Yes [provider]   apixaban (ELIQUIS) 5 MG TABS tablet Take 10 mg twice daily for 5 days followed by 5 mg twice daily.  Continue Eliquis until further notice from your primary care physician or oncologist. Patient not taking: Reported on 06/16/2023 04/11/23   Sheril Dines, MD  BEN GAY ULTRA STRENGTH 06-14-28 % CREA Apply 1 Application topically 3 (three) times daily. Apply to lower back Patient not taking: Reported on 06/16/2023 04/13/23   [provider]  GLUMETZA 1000 MG 24 hr tablet Take 1,000 mg by mouth 2 (two) times daily with a meal. Patient not taking: Reported on 06/16/2023 06/05/21   [provider]  JARDIANCE 10 MG TABS tablet Take 10 mg by mouth daily. Patient not taking: Reported on 06/16/2023 05/26/23   [provider]  LYNPARZA 100 MG tablet Take 100 mg by mouth 2 (two) times daily. Patient not taking: Reported on 06/16/2023 07/01/22   [provider]  LYNPARZA 150 MG tablet Take 150 mg by mouth 2 (two) times daily. Patient not taking: Reported on 06/16/2023 07/01/22   [provider]    Physical Exam: Vitals:   06/16/23 2300 06/16/23 2330 06/16/23 2345 06/17/23 0000  BP: (!) 111/58 120/76  129/80  Pulse: (!) 113 (!) 112 (!) 111 (!) 112  Resp:   (!) 25 20  Temp:    98.8 F (37.1 C)  TempSrc:    Oral  SpO2: 100% 99% 99% 100%   Physical Exam Vitals and nursing note reviewed.  Constitutional:      General: He is not in acute distress.    Comments: Patient awake but slow to respond to questions  HENT:     Head: Normocephalic and atraumatic.  Cardiovascular:     Rate and Rhythm: Regular rhythm. Tachycardia present.     Heart sounds: Normal heart sounds.  Pulmonary:     Effort: Tachypnea present.     Breath sounds: Normal breath sounds.  Abdominal:     Palpations: Abdomen is soft.     Tenderness: There is no abdominal tenderness.  Neurological:     General: No focal deficit present.     Mental Status: He is lethargic.    Labs on Admission: I have  personally reviewed following labs and imaging studies  CBC: Recent Labs  Lab 06/14/23 1408 06/14/23 1801 06/16/23 1750  WBC 4.3 4.4 4.6  NEUTROABS 3.5 3.4 3.7  HGB 6.4* 6.5* 7.2*  HCT 20.8* 21.4* 22.4*  MCV 100.5* 99.5 97.4  PLT 282 290 302   Basic Metabolic Panel: Recent Labs  Lab 06/14/23 1408 06/14/23 1801 06/16/23 1750  NA 131* 130* 132*  K 4.2  4.3 4.3  CL 100 99 101  CO2 20* 22 19*  GLUCOSE 235* 209* 247*  BUN 28* 29* 28*  CREATININE 0.96 0.97 1.03  CALCIUM 8.2* 8.5* 8.1*   GFR: Estimated Creatinine Clearance: 53.9 mL/min (by C-G formula based on SCr of 1.03 mg/dL). Liver Function Tests: Recent Labs  Lab 06/14/23 1408 06/14/23 1801 06/16/23 1750  AST 28 30 24   ALT 12 10 11   ALKPHOS 534* 504* 409*  BILITOT 0.5 0.5 0.6  PROT 6.7 6.9 6.5  ALBUMIN 2.7* 2.8* 2.6*   No results for input(s): "LIPASE", "AMYLASE" in the last 168 hours. No results for input(s): "AMMONIA" in the last 168 hours. Coagulation Profile: Recent Labs  Lab 06/16/23 1750  INR 1.4*   Cardiac Enzymes: No results for input(s): "CKTOTAL", "CKMB", "CKMBINDEX", "TROPONINI" in the last 168 hours. BNP (last 3 results) No results for input(s): "PROBNP" in the last 8760 hours. HbA1C: No results for input(s): "HGBA1C" in the last 72 hours. CBG: No results for input(s): "GLUCAP" in the last 168 hours. Lipid Profile: No results for input(s): "CHOL", "HDL", "LDLCALC", "TRIG", "CHOLHDL", "LDLDIRECT" in the last 72 hours. Thyroid Function Tests: No results for input(s): "TSH", "T4TOTAL", "FREET4", "T3FREE", "THYROIDAB" in the last 72 hours. Anemia Panel: No results for input(s): "VITAMINB12", "FOLATE", "FERRITIN", "TIBC", "IRON", "RETICCTPCT" in the last 72 hours. Urine analysis:    Component Value Date/Time   COLORURINE YELLOW (A) 06/16/2023 2049   APPEARANCEUR HAZY (A) 06/16/2023 2049   APPEARANCEUR Clear 02/23/2022 1000   LABSPEC 1.018 06/16/2023 2049   PHURINE 5.0 06/16/2023 2049    GLUCOSEU NEGATIVE 06/16/2023 2049   HGBUR NEGATIVE 06/16/2023 2049   BILIRUBINUR NEGATIVE 06/16/2023 2049   BILIRUBINUR Negative 02/23/2022 1000   KETONESUR NEGATIVE 06/16/2023 2049   PROTEINUR 30 (A) 06/16/2023 2049   NITRITE NEGATIVE 06/16/2023 2049   LEUKOCYTESUR SMALL (A) 06/16/2023 2049    Radiological Exams on Admission: DG Chest Port 1 View Result Date: 06/16/2023 CLINICAL DATA:  Altered mental status. EXAM: PORTABLE CHEST 1 VIEW COMPARISON:  Chest radiograph dated 06/14/2023. FINDINGS: Low lung volumes. Stable cardiomegaly. Stable left-sided pacemaker in place. Aortic atherosclerosis. No overt edema, focal consolidation, sizeable pleural effusion, or pneumothorax. No acute osseous abnormality. IMPRESSION: 1. Low lung volumes.  No acute cardiopulmonary findings. 2. Stable cardiomegaly. Electronically Signed   By: Mannie Seek M.D.   On: 06/16/2023 19:05   CT Head Wo Contrast Result Date: 06/16/2023 CLINICAL DATA:  Altered mental status. Personal history of Parkinson's disease. EXAM: CT HEAD WITHOUT CONTRAST TECHNIQUE: Contiguous axial images were obtained from the base of the skull through the vertex without intravenous contrast. RADIATION DOSE REDUCTION: This exam was performed according to the departmental dose-optimization program which includes automated exposure control, adjustment of the mA and/or kV according to patient size and/or use of iterative reconstruction technique. COMPARISON:  CT head without contrast three/twenty four/twenty five. FINDINGS: Brain: Advanced atrophy and confluent diffuse white matter hypoattenuation is similar the prior study. No acute infarct, hemorrhage, or mass lesion is present. Basal ganglia calcifications are again noted bilaterally. Deep gray nuclei are otherwise within normal limits. Insert proportionate ventricles no acute or focal cortical abnormality is present. No significant extraaxial fluid collection is present. Remote lacunar infarcts are  again noted within the cerebellum bilaterally. The brainstem and cerebellum are otherwise within normal limits. Midline structures are within normal limits. Vascular: Atherosclerotic calcifications are present within the cavernous internal carotid arteries bilaterally. No hyperdense vessel is present. Skull: Calvarium is intact. No focal lytic  or blastic lesions are present. Sinuses/Orbits: The paranasal sinuses and mastoid air cells are clear. The globes and orbits are within normal limits. IMPRESSION: 1. Stable advanced atrophy and diffuse white matter disease. 2. No acute intracranial abnormality. 3. 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 Electronically Signed   By: Audree Leas M.D.   On: 06/16/2023 18:34     Data Reviewed: Relevant notes from primary care and specialist visits, past discharge summaries as available in EHR, including Care Everywhere. Prior diagnostic testing as pertinent to current admission diagnoses Updated medications and problem lists for reconciliation ED course, including vitals, labs, imaging, treatment and response to treatment Triage notes, nursing and pharmacy notes and ED provider's notes Notable results as noted in HPI   Assessment and Plan: * Sepsis (HCC) History of ESBL E. coli UTI on 05/29/2023 Sepsis criteria include fever, tachycardia and tachypnea with lactic acidosis Source of sepsis unclear from workup but suspecting UTI in view of recent infection Continue sepsis fluids Will continue broad-spectrum antibiotics of cefepime vancomycin and Flagyl for sepsis of unknown source pending cultures Follow cultures  Chronic systolic heart failure (HCC) Cardiomyopathy s/p AICD, EF 40 to 45% 04/2023 Clinically dry Monitor for fluid overload in view of ongoing sepsis fluids Cautiously continue carvedilol.  Holding spironolactone and furosemide in the setting of sepsis Daily weights  Uncontrolled type 2 diabetes mellitus with  hyperglycemia, with long-term current use of insulin (HCC) Blood glucose over 200 Basal insulin Sliding scale insulin coverage  Anemia Hemoglobin 7.2, up from 6.5 a few days prior No acute bleeding suspected however patient is on Eliquis-did have hematuria during his hospitalization a couple weeks prior Serial H&H  Acute metabolic encephalopathy Secondary to sepsis Neurologic checks with fall and aspiration precautions  Essential (primary) hypertension Continuing carvedilol.  Holding spironolactone and furosemide in view of sepsis diagnosis and fluid resuscitation  Prostate cancer (HCC) Elevated alk phos likely related to bony mets Followed by oncology On olaparib.  Will hold in the setting of sepsis  Alzheimer's dementia (HCC) Delirium precautions  History of VTE (DVT and pulmonary embolism) Chronic anticoagulation Continue apixaban  Goals of care, counseling/discussion Patient is currently being evaluated for hospice however not yet admitted.  Is currently a full code-per discussion between ED provider and patient's sisters who are no longer at bedside.    DVT prophylaxis: Apixaban  Consults: none  Advance Care Planning:   Code Status: Prior   Family Communication: Sisters Arboriculturist at bedside  Disposition Plan: Back to previous home environment  Severity of Illness: The appropriate patient status for this patient is OBSERVATION. Observation status is judged to be reasonable and necessary in order to provide the required intensity of service to ensure the patient's safety. The patient's presenting symptoms, physical exam findings, and initial radiographic and laboratory data in the context of their medical condition is felt to place them at decreased risk for further clinical deterioration. Furthermore, it is anticipated that the patient will be medically stable for discharge from the hospital within 2 midnights of admission.   Author: Lanetta Pion,  MD 06/17/2023 12:34 AM  For on call review [email or web address].

## 2023-06-17 NOTE — Assessment & Plan Note (Signed)
Chronic anticoagulation Continue apixaban

## 2023-06-17 NOTE — Assessment & Plan Note (Addendum)
 Patient being evaluated by hospice.  Discussed with family.  Placed DNR

## 2023-06-17 NOTE — Progress Notes (Addendum)
 Interval events noted.  He has no complaints.  He has dementia at baseline.  He is only oriented to person.  Otherwise, sister, was at the bedside. Reshonda, RN, was also at the bedside. Chart reviewed.  Hemoglobin down from 7.2-6.8.  He has chronic anemia likely from anemia of chronic disease from underlying malignancy (metastatic prostate cancer). He will be transfused 1 unit of PRBCs for severe anemia.  Discussed risks and benefits of blood transfusion with Iris, sister, at the bedside.  She is agreeable with blood transfusion.  Of note, it appears patient was brought to the ED on 06/14/2023 for blood transfusion.  Recent admission with ESBL E. coli intermediate to IV cefepime.  Change IV cefepime to IV meropenem for sepsis from acute UTI.  Follow-up urine and blood cultures.  Continue Eliquis for recent pulmonary embolism.  Iris, sister, understands that patient is at increased risk for bleeding with potential serious complications.

## 2023-06-17 NOTE — Assessment & Plan Note (Signed)
 Continuing carvedilol.  Holding spironolactone and furosemide in view of sepsis diagnosis and fluid resuscitation

## 2023-06-17 NOTE — Assessment & Plan Note (Signed)
 Blood glucose over 200 Basal insulin Sliding scale insulin coverage

## 2023-06-17 NOTE — Assessment & Plan Note (Signed)
 Elevated alk phos likely related to bony mets Followed by oncology On olaparib.  Will hold in the setting of sepsis

## 2023-06-17 NOTE — Assessment & Plan Note (Addendum)
 Cardiomyopathy s/p AICD, EF 40 to 45% 04/2023 Clinically dry Monitor for fluid overload in view of ongoing sepsis fluids Cautiously continue carvedilol.  Holding spironolactone and furosemide in the setting of sepsis Daily weights

## 2023-06-17 NOTE — Assessment & Plan Note (Signed)
 History of ESBL E. coli UTI on 05/29/2023 Sepsis criteria include fever, tachycardia and tachypnea with lactic acidosis Source of sepsis unclear from workup but suspecting UTI in view of recent infection Continue sepsis fluids Will continue broad-spectrum antibiotics of cefepime vancomycin and Flagyl for sepsis of unknown source pending cultures Follow cultures

## 2023-06-18 DIAGNOSIS — R6520 Severe sepsis without septic shock: Secondary | ICD-10-CM | POA: Diagnosis not present

## 2023-06-18 DIAGNOSIS — G9341 Metabolic encephalopathy: Secondary | ICD-10-CM | POA: Diagnosis not present

## 2023-06-18 DIAGNOSIS — A419 Sepsis, unspecified organism: Secondary | ICD-10-CM | POA: Diagnosis not present

## 2023-06-18 LAB — BLOOD CULTURE ID PANEL (REFLEXED) - BCID2

## 2023-06-18 LAB — URINE CULTURE

## 2023-06-18 LAB — BPAM RBC
Blood Product Expiration Date: 202505022359
ISSUE DATE / TIME: 202504121446
Unit Type and Rh: 5100

## 2023-06-18 LAB — TYPE AND SCREEN
ABO/RH(D): O POS
Antibody Screen: NEGATIVE
Unit division: 0

## 2023-06-18 LAB — GLUCOSE, CAPILLARY
Glucose-Capillary: 147 mg/dL — ABNORMAL HIGH (ref 70–99)
Glucose-Capillary: 212 mg/dL — ABNORMAL HIGH (ref 70–99)
Glucose-Capillary: 180 mg/dL — ABNORMAL HIGH (ref 70–99)
Glucose-Capillary: 171 mg/dL — ABNORMAL HIGH (ref 70–99)

## 2023-06-18 MED ORDER — FLUCONAZOLE IN SODIUM CHLORIDE 200-0.9 MG/100ML-% IV SOLN
200.0000 mg | INTRAVENOUS | Status: DC
  Administered 2023-06-18 – 2023-06-19 (×2): 200 mg via INTRAVENOUS
  Filled 2023-06-18 (×3): qty 100

## 2023-06-18 NOTE — Progress Notes (Signed)
 Progress Note    Matthew Mcgrath  ZOX:[id number] DOB: December 08, 1945  DOA: 06/16/2023 PCP: [facility]      Brief Narrative:    Medical records reviewed and are as summarized below:  Matthew Mcgrath is a 78 y.o. 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 coli UTI, being admitted from his facility with sepsis of unknown source after presenting with altered mental status described as decreased responsiveness as well as a fever of 102.   In the ED, temperature was 98.6 F, respiratory rate 28, heart rate 118, BP 133/84 and oxygen saturation 100% on room air.      Assessment/Plan:   Principal Problem:   Sepsis (HCC) Active Problems:   History of ESBL E. coli UTI 05/29/23   Chronic systolic heart failure (HCC)   ICD (implantable cardioverter-defibrillator) in place   Anemia   Uncontrolled type 2 diabetes mellitus with hyperglycemia, with long-term current use of insulin (HCC)   Acute metabolic encephalopathy   Essential (primary) hypertension   Prostate cancer (HCC)   Alzheimer's dementia (HCC)   Elevation of level of transaminase and lactic acid dehydrogenase (LDH)   Goals of care, counseling/discussion   History of VTE (DVT and pulmonary embolism)   Chronic anticoagulation    Body mass index is 23.66 kg/m.   Sepsis (HCC) History of ESBL E. coli UTI on 05/29/2023 Blood culture showed Staph epidermidis in 1 out of 4 bottles.  Suspect this may be a contaminant.  Urine culture showed yeast. Continue IV vancomycin and cefepime for now. Add IV fluconazole given immunocompromised state. Repeat blood cultures    Chronic systolic heart failure (HCC) Cardiomyopathy s/p AICD, EF 40 to 45%  04/2023 Euvolemic Continue carvedilol. Spironolactone and Lasix on hold.      Uncontrolled type 2 diabetes mellitus with hyperglycemia, with long-term current use of insulin (HCC) Continue Lantus and NovoLog as needed for hyperglycemia.    Acute on chronic anemia Hemoglobin up from 6.8-8.1. S/p transfusion with 1 unit of PRBCs on 06/17/2023. Monitor H&H. Of note recently transfused with packed red blood cells on 06/14/2023 when he visited the ED.    Acute metabolic encephalopathy on underlying dementia. Improved.  It appears he is back to baseline.    Prostate cancer (HCC) Elevated alk phos likely related to bony mets Followed by oncology On olaparib.  Will hold in the setting of sepsis     History of VTE (DVT and pulmonary embolism) in December 2024 Continue Eliquis Discussed risks and benefits of long-term anticoagulation with Glenda, sister, at the bedside.  She understands that patient is at increased risk of bleeding but unfortunately also at risk for recurrent thromboembolism.  She prefers to continue Eliquis for now.    Diet Order             Diet heart healthy/carb modified Room service appropriate? Yes; Fluid consistency: Thin  Diet effective now                            Consultants: None  Procedures: None    Medications:    apixaban  5 mg Oral BID   carvedilol  3.125 mg Oral BID WC   cholecalciferol  1,000 Units Oral  Daily   gabapentin  200 mg Oral BID   Glucerna  237 mL Oral BID   insulin aspart  0-5 Units Subcutaneous QHS   insulin aspart  0-9 Units Subcutaneous TID WC   insulin glargine-yfgn  15 Units Subcutaneous QHS   magnesium oxide  400 mg Oral Daily   pantoprazole  40 mg Oral Daily   polyethylene glycol  17 g Oral Daily   Pro-Stat AWC  30 mL Oral BID   tamsulosin  0.4 mg Oral QPM   Continuous Infusions:  meropenem (MERREM) IV 1 g (06/18/23 0556)   vancomycin 1,250 mg (06/17/23 2148)     Anti-infectives (From  admission, onward)    Start     Dose/Rate Route Frequency Ordered Stop   06/17/23 2100  vancomycin (VANCOREADY) IVPB 1250 mg/250 mL        1,250 mg 166.7 mL/hr over 90 Minutes Intravenous Every 24 hours 06/17/23 0156     06/17/23 1200  meropenem (MERREM) 1 g in sodium chloride 0.9 % 100 mL IVPB        1 g 200 mL/hr over 30 Minutes Intravenous Every 8 hours 06/17/23 1040     06/17/23 0900  metroNIDAZOLE (FLAGYL) IVPB 500 mg  Status:  Discontinued        500 mg 100 mL/hr over 60 Minutes Intravenous Every 12 hours 06/17/23 0040 06/17/23 1040   06/17/23 0700  ceFEPIme (MAXIPIME) 2 g in sodium chloride 0.9 % 100 mL IVPB  Status:  Discontinued        2 g 200 mL/hr over 30 Minutes Intravenous Every 12 hours 06/17/23 0049 06/17/23 1040   06/17/23 0145  vancomycin (VANCOREADY) IVPB 500 mg/100 mL        500 mg 100 mL/hr over 60 Minutes Intravenous  Once 06/17/23 0050 06/17/23 0233   06/16/23 1800  ceFEPIme (MAXIPIME) 2 g in sodium chloride 0.9 % 100 mL IVPB        2 g 200 mL/hr over 30 Minutes Intravenous  Once 06/16/23 1749 06/16/23 2046   06/16/23 1800  metroNIDAZOLE (FLAGYL) IVPB 500 mg        500 mg 100 mL/hr over 60 Minutes Intravenous  Once 06/16/23 1749 06/16/23 2208   06/16/23 1800  vancomycin (VANCOCIN) IVPB 1000 mg/200 mL premix        1,000 mg 200 mL/hr over 60 Minutes Intravenous  Once 06/16/23 1749 06/16/23 2208              Family Communication/Anticipated D/C date and plan/Code Status   DVT prophylaxis:  apixaban (ELIQUIS) tablet 5 mg     Code Status: Limited: Do not attempt resuscitation (DNR) -DNR-LIMITED -Do Not Intubate/DNI   Family Communication: Plan discussed with Glenda, sister, at the bedside Disposition Plan: Plan to discharge to SNF   Status is: Inpatient Remains inpatient appropriate because: UTI       Subjective:   Interval events noted. He has no complaints  Objective:    Vitals:   06/17/23 1737 06/17/23 2058 06/18/23 0318 06/18/23  0800  BP: 128/78 125/82 118/68 115/85  Pulse: 88   (!) 104  Resp: 16 20 20 18   Temp: 98.8 F (37.1 C) (!) 100.4 F (38 C) 99.6 F (37.6 C) 98.9 F (37.2 C)  TempSrc: Oral Axillary Axillary Axillary  SpO2: 98% 100% 98% 97%  Weight:      Height:       No data found.   Intake/Output Summary (Last 24 hours) at 06/18/2023 1246 Last  data filed at 06/18/2023 1106 Gross per 24 hour  Intake 1069.74 ml  Output 1225 ml  Net -155.26 ml   Filed Weights   06/17/23 0730  Weight: 64.5 kg    Exam:  GEN: NAD SKIN: Warm and dry EYES: No pallor or icterus ENT: MMM CV: RRR PULM: CTA B ABD: soft, ND, NT, +BS CNS: AAO x 1 (person), non focal EXT: No edema or tenderness       Data Reviewed:   I have personally reviewed following labs and imaging studies:  Labs: Labs show the following:   Basic Metabolic Panel: Recent Labs  Lab 06/14/23 1408 06/14/23 1801 06/16/23 1750 06/17/23 0542  NA 131* 130* 132* 134*  K 4.2 4.3 4.3 4.1  CL 100 99 101 103  CO2 20* 22 19* 23  GLUCOSE 235* 209* 247* 188*  BUN 28* 29* 28* 18  CREATININE 0.96 0.97 1.03 0.78  CALCIUM 8.2* 8.5* 8.1* 8.1*   GFR Estimated Creatinine Clearance: 69.4 mL/min (by C-G formula based on SCr of 0.78 mg/dL). Liver Function Tests: Recent Labs  Lab 06/14/23 1408 06/14/23 1801 06/16/23 1750  AST 28 30 24   ALT 12 10 11   ALKPHOS 534* 504* 409*  BILITOT 0.5 0.5 0.6  PROT 6.7 6.9 6.5  ALBUMIN 2.7* 2.8* 2.6*   No results for input(s): "LIPASE", "AMYLASE" in the last 168 hours. No results for input(s): "AMMONIA" in the last 168 hours. Coagulation profile Recent Labs  Lab 06/16/23 1750  INR 1.4*    CBC: Recent Labs  Lab 06/14/23 1408 06/14/23 1801 06/16/23 1750 06/17/23 0542 06/17/23 1828  WBC 4.3 4.4 4.6 4.0 4.6  NEUTROABS 3.5 3.4 3.7  --   --   HGB 6.4* 6.5* 7.2* 6.8* 8.1*  HCT 20.8* 21.4* 22.4* 21.9* 25.2*  MCV 100.5* 99.5 97.4 98.2 94.4  PLT 282 290 302 232 231   Cardiac Enzymes: No  results for input(s): "CKTOTAL", "CKMB", "CKMBINDEX", "TROPONINI" in the last 168 hours. BNP (last 3 results) No results for input(s): "PROBNP" in the last 8760 hours. CBG: Recent Labs  Lab 06/17/23 1624 06/17/23 1831 06/17/23 2059 06/18/23 0830 06/18/23 1228  GLUCAP 134* 219* 153* 147* 212*   D-Dimer: No results for input(s): "DDIMER" in the last 72 hours. Hgb A1c: No results for input(s): "HGBA1C" in the last 72 hours. Lipid Profile: No results for input(s): "CHOL", "HDL", "LDLCALC", "TRIG", "CHOLHDL", "LDLDIRECT" in the last 72 hours. Thyroid function studies: No results for input(s): "TSH", "T4TOTAL", "T3FREE", "THYROIDAB" in the last 72 hours.  Invalid input(s): "FREET3" Anemia work up: No results for input(s): "VITAMINB12", "FOLATE", "FERRITIN", "TIBC", "IRON", "RETICCTPCT" in the last 72 hours. Sepsis Labs: Recent Labs  Lab 06/14/23 1801 06/16/23 1750 06/16/23 1822 06/16/23 2215 06/17/23 0542 06/17/23 1828  WBC 4.4 4.6  --   --  4.0 4.6  LATICACIDVEN  --   --  2.0* 2.5*  --   --     Microbiology Recent Results (from the past 240 hours)  Resp panel by RT-PCR (RSV, Flu A&B, Covid) Anterior Nasal Swab     Status: None   Collection Time: 06/16/23  5:50 PM   Specimen: Anterior Nasal Swab  Result Value Ref Range Status   SARS Coronavirus 2 by RT PCR NEGATIVE NEGATIVE Final    Comment: (NOTE) SARS-CoV-2 target nucleic acids are NOT DETECTED.  The SARS-CoV-2 RNA is generally detectable in upper respiratory specimens during the acute phase of infection. The lowest concentration of SARS-CoV-2 viral copies this assay can  detect is 138 copies/mL. A negative result does not preclude SARS-Cov-2 infection and should not be used as the sole basis for treatment or other patient management decisions. A negative result may occur with  improper specimen collection/handling, submission of specimen other than nasopharyngeal swab, presence of viral mutation(s) within the areas  targeted by this assay, and inadequate number of viral copies(<138 copies/mL). A negative result must be combined with clinical observations, patient history, and epidemiological information. The expected result is Negative.  Fact Sheet for Patients:  BloggerCourse.com  Fact Sheet for Healthcare Providers:  SeriousBroker.it  This test is no t yet approved or cleared by the [place]  FDA and  has been authorized for detection and/or diagnosis of SARS-CoV-2 by FDA under an Emergency Use Authorization (EUA). This EUA will remain  in effect (meaning this test can be used) for the duration of the COVID-19 declaration under Section 564(b)(1) of the Act, 21 U.S.C.section 360bbb-3(b)(1), unless the authorization is terminated  or revoked sooner.       Influenza A by PCR NEGATIVE NEGATIVE Final   Influenza B by PCR NEGATIVE NEGATIVE Final    Comment: (NOTE) The Xpert Xpress SARS-CoV-2/FLU/RSV plus assay is intended as an aid in the diagnosis of influenza from Nasopharyngeal swab specimens and should not be used as a sole basis for treatment. Nasal washings and aspirates are unacceptable for Xpert Xpress SARS-CoV-2/FLU/RSV testing.  Fact Sheet for Patients: BloggerCourse.com  Fact Sheet for Healthcare Providers: SeriousBroker.it  This test is not yet approved or cleared by the [place]  FDA and has been authorized for detection and/or diagnosis of SARS-CoV-2 by FDA under an Emergency Use Authorization (EUA). This EUA will remain in effect (meaning this test can be used) for the duration of the COVID-19 declaration under Section 564(b)(1) of the Act, 21 U.S.C. section 360bbb-3(b)(1), unless the authorization is terminated or revoked.     Resp Syncytial Virus by PCR NEGATIVE NEGATIVE Final    Comment: (NOTE) Fact Sheet for  Patients: BloggerCourse.com  Fact Sheet for Healthcare Providers: SeriousBroker.it  This test is not yet approved or cleared by the [place]  FDA and has been authorized for detection and/or diagnosis of SARS-CoV-2 by FDA under an Emergency Use Authorization (EUA). This EUA will remain in effect (meaning this test can be used) for the duration of the COVID-19 declaration under Section 564(b)(1) of the Act, 21 U.S.C. section 360bbb-3(b)(1), unless the authorization is terminated or revoked.  Performed at [facility], [address] [place], [place] [address]   Blood Culture (routine x 2)     Status: None (Preliminary result)   Collection Time: 06/16/23  6:05 PM   Specimen: BLOOD  Result Value Ref Range Status   Specimen Description BLOOD RIGHT ANTECUBITAL  Final   Special Requests   Final    BOTTLES DRAWN AEROBIC AND ANAEROBIC Blood Culture results may not be optimal due to an inadequate volume of blood received in culture bottles   Culture   Final    NO GROWTH 2 DAYS Performed at [facility], [address], [place], [place] [address]    Report Status PENDING  Incomplete  Blood Culture (routine x 2)     Status: None (Preliminary result)   Collection Time: 06/16/23  6:23 PM   Specimen: BLOOD  Result Value Ref Range Status   Specimen Description BLOOD BLOOD RIGHT HAND  Final   Special Requests   Final    BOTTLES DRAWN AEROBIC AND ANAEROBIC Blood Culture adequate volume  Culture  Setup Time   Final    Organism ID to follow GRAM POSITIVE COCCI AEROBIC BOTTLE ONLY CRITICAL RESULT CALLED TO, READ BACK BY AND VERIFIED WITH: RAQUEL RODRIGUEZ GUZMAN ON 06/18/23 AT 1221 QSD Performed at [facility], [address] [place], [place] [address]    Culture [facility] POSITIVE COCCI  Final   Report Status PENDING  Incomplete  Blood Culture ID Panel (Reflexed)     Status: Abnormal   Collection Time:  06/16/23  6:23 PM  Result Value Ref Range Status   Enterococcus faecalis NOT DETECTED NOT DETECTED Final   Enterococcus Faecium NOT DETECTED NOT DETECTED Final   Listeria monocytogenes NOT DETECTED NOT DETECTED Final   Staphylococcus species DETECTED (A) NOT DETECTED Final    Comment: CRITICAL RESULT CALLED TO, READ BACK BY AND VERIFIED WITH: RAQUEL RODRIGUEZ GUZMAN ON 06/18/23 AT 1221 QSD    Staphylococcus aureus (BCID) NOT DETECTED NOT DETECTED Final   Staphylococcus epidermidis DETECTED (A) NOT DETECTED Final    Comment: Methicillin (oxacillin) resistant coagulase negative staphylococcus. Possible blood culture contaminant (unless isolated from more than one blood culture draw or clinical case suggests pathogenicity). No antibiotic treatment is indicated for blood  culture contaminants. CRITICAL RESULT CALLED TO, READ BACK BY AND VERIFIED WITH: RAQUEL RODRIGUEZ GUZMAN ON 06/18/23 AT 1221 QSD    Staphylococcus lugdunensis NOT DETECTED NOT DETECTED Final   Streptococcus species NOT DETECTED NOT DETECTED Final   Streptococcus agalactiae NOT DETECTED NOT DETECTED Final   Streptococcus pneumoniae NOT DETECTED NOT DETECTED Final   Streptococcus pyogenes NOT DETECTED NOT DETECTED Final   A.calcoaceticus-baumannii NOT DETECTED NOT DETECTED Final   Bacteroides fragilis NOT DETECTED NOT DETECTED Final   Enterobacterales NOT DETECTED NOT DETECTED Final   Enterobacter cloacae complex NOT DETECTED NOT DETECTED Final   Escherichia coli NOT DETECTED NOT DETECTED Final   Klebsiella aerogenes NOT DETECTED NOT DETECTED Final   Klebsiella oxytoca NOT DETECTED NOT DETECTED Final   Klebsiella pneumoniae NOT DETECTED NOT DETECTED Final   Proteus species NOT DETECTED NOT DETECTED Final   Salmonella species NOT DETECTED NOT DETECTED Final   Serratia marcescens NOT DETECTED NOT DETECTED Final   Haemophilus influenzae NOT DETECTED NOT DETECTED Final   Neisseria meningitidis NOT DETECTED NOT DETECTED Final    Pseudomonas aeruginosa NOT DETECTED NOT DETECTED Final   Stenotrophomonas maltophilia NOT DETECTED NOT DETECTED Final   Candida albicans NOT DETECTED NOT DETECTED Final   Candida auris NOT DETECTED NOT DETECTED Final   Candida glabrata NOT DETECTED NOT DETECTED Final   Candida krusei NOT DETECTED NOT DETECTED Final   Candida parapsilosis NOT DETECTED NOT DETECTED Final   Candida tropicalis NOT DETECTED NOT DETECTED Final   Cryptococcus neoformans/gattii NOT DETECTED NOT DETECTED Final   Methicillin resistance mecA/C DETECTED (A) NOT DETECTED Final    Comment: CRITICAL RESULT CALLED TO, READ BACK BY AND VERIFIED WITH: RAQUEL RODRIGUEZ GUZMAN ON 06/18/23 AT 1221 QSD Performed at [facility], [address] [place], [place] [address]   Urine Culture     Status: Abnormal   Collection Time: 06/16/23  8:49 PM   Specimen: Urine, Random  Result Value Ref Range Status   Specimen Description   Final    URINE, RANDOM Performed at [facility], [address] [place], [place] [address]    Special Requests   Final    NONE Reflexed from [id number] Performed at [facility], [address] [place], [place] [address]    Culture 80,000  COLONIES/mL YEAST (A)  Final   Report Status 06/18/2023 FINAL  Final    Procedures and diagnostic studies:  DG Chest Port 1 View Result Date: 06/16/2023 CLINICAL DATA:  Altered mental status. EXAM: PORTABLE CHEST 1 VIEW COMPARISON:  Chest radiograph dated 06/14/2023. FINDINGS: Low lung volumes. Stable cardiomegaly. Stable left-sided pacemaker in place. Aortic atherosclerosis. No overt edema, focal consolidation, sizeable pleural effusion, or pneumothorax. No acute osseous abnormality. IMPRESSION: 1. Low lung volumes.  No acute cardiopulmonary findings. 2. Stable cardiomegaly. Electronically Signed   By: Mannie Seek M.D.   On: 06/16/2023 19:05   CT Head Wo Contrast Result Date: 06/16/2023 CLINICAL DATA:  Altered mental status.  Personal history of Parkinson's disease. EXAM: CT HEAD WITHOUT CONTRAST TECHNIQUE: Contiguous axial images were obtained from the base of the skull through the vertex without intravenous contrast. RADIATION DOSE REDUCTION: This exam was performed according to the departmental dose-optimization program which includes automated exposure control, adjustment of the mA and/or kV according to patient size and/or use of iterative reconstruction technique. COMPARISON:  CT head without contrast three/twenty four/twenty five. FINDINGS: Brain: Advanced atrophy and confluent diffuse white matter hypoattenuation is similar the prior study. No acute infarct, hemorrhage, or mass lesion is present. Basal ganglia calcifications are again noted bilaterally. Deep gray nuclei are otherwise within normal limits. Insert proportionate ventricles no acute or focal cortical abnormality is present. No significant extraaxial fluid collection is present. Remote lacunar infarcts are again noted within the cerebellum bilaterally. The brainstem and cerebellum are otherwise within normal limits. Midline structures are within normal limits. Vascular: Atherosclerotic calcifications are present within the cavernous internal carotid arteries bilaterally. No hyperdense vessel is present. Skull: Calvarium is intact. No focal lytic or blastic lesions are present. Sinuses/Orbits: The paranasal sinuses and mastoid air cells are clear. The globes and orbits are within normal limits. IMPRESSION: 1. Stable advanced atrophy and diffuse white matter disease. 2. No acute intracranial abnormality. 3. 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 Electronically Signed   By: Audree Leas M.D.   On: 06/16/2023 18:34               LOS: 1 day   Maxximus Gotay  [facility]   Pager on [email or web address]. If 7PM-7AM, please contact night-coverage at [email or web address]     06/18/2023, 12:46 PM

## 2023-06-18 NOTE — Progress Notes (Signed)
 PHARMACY - PHYSICIAN COMMUNICATION CRITICAL VALUE ALERT - BLOOD CULTURE IDENTIFICATION (BCID)  Matthew Mcgrath is an 78 y.o. male who presented to [facility] on 06/16/2023 with a chief complaint of sepsis of unknown source.  Name of physician (or Provider) Contacted: Dr.Ayiku  Current antibiotics: meropenem and vancomycin IV  Changes to prescribed antibiotics recommended:  Patient is on recommended antibiotics - No changes needed  Results for orders placed or performed during the hospital encounter of 11/26/22  Blood Culture ID Panel (Reflexed) (Collected: 11/26/2022  9:32 AM)  Result Value Ref Range   Enterococcus faecalis NOT DETECTED NOT DETECTED   Enterococcus Faecium NOT DETECTED NOT DETECTED   Listeria monocytogenes NOT DETECTED NOT DETECTED   Staphylococcus species NOT DETECTED NOT DETECTED   Staphylococcus aureus (BCID) NOT DETECTED NOT DETECTED   Staphylococcus epidermidis NOT DETECTED NOT DETECTED   Staphylococcus lugdunensis NOT DETECTED NOT DETECTED   Streptococcus species NOT DETECTED NOT DETECTED   Streptococcus agalactiae NOT DETECTED NOT DETECTED   Streptococcus pneumoniae NOT DETECTED NOT DETECTED   Streptococcus pyogenes NOT DETECTED NOT DETECTED   A.calcoaceticus-baumannii NOT DETECTED NOT DETECTED   Bacteroides fragilis NOT DETECTED NOT DETECTED   Enterobacterales NOT DETECTED NOT DETECTED   Enterobacter cloacae complex NOT DETECTED NOT DETECTED   Escherichia coli NOT DETECTED NOT DETECTED   Klebsiella aerogenes NOT DETECTED NOT DETECTED   Klebsiella oxytoca NOT DETECTED NOT DETECTED   Klebsiella pneumoniae NOT DETECTED NOT DETECTED   Proteus species NOT DETECTED NOT DETECTED   Salmonella species NOT DETECTED NOT DETECTED   Serratia marcescens NOT DETECTED NOT DETECTED   Haemophilus influenzae NOT DETECTED NOT DETECTED   Neisseria meningitidis NOT DETECTED NOT DETECTED   Pseudomonas aeruginosa NOT DETECTED NOT DETECTED   Stenotrophomonas maltophilia NOT  DETECTED NOT DETECTED   Candida albicans NOT DETECTED NOT DETECTED   Candida auris NOT DETECTED NOT DETECTED   Candida glabrata NOT DETECTED NOT DETECTED   Candida krusei NOT DETECTED NOT DETECTED   Candida parapsilosis NOT DETECTED NOT DETECTED   Candida tropicalis NOT DETECTED NOT DETECTED   Cryptococcus neoformans/gattii NOT DETECTED NOT DETECTED    Samiel Peel Rodriguez-Guzman PharmD, BCPS 06/18/2023 12:29 PM

## 2023-06-19 DIAGNOSIS — A419 Sepsis, unspecified organism: Secondary | ICD-10-CM | POA: Diagnosis not present

## 2023-06-19 DIAGNOSIS — R6520 Severe sepsis without septic shock: Secondary | ICD-10-CM | POA: Diagnosis not present

## 2023-06-19 DIAGNOSIS — G9341 Metabolic encephalopathy: Secondary | ICD-10-CM | POA: Diagnosis not present

## 2023-06-19 LAB — GLUCOSE, CAPILLARY
Glucose-Capillary: 114 mg/dL — ABNORMAL HIGH (ref 70–99)
Glucose-Capillary: 128 mg/dL — ABNORMAL HIGH (ref 70–99)
Glucose-Capillary: 250 mg/dL — ABNORMAL HIGH (ref 70–99)
Glucose-Capillary: 229 mg/dL — ABNORMAL HIGH (ref 70–99)

## 2023-06-19 LAB — CULTURE, BLOOD (ROUTINE X 2): Special Requests: ADEQUATE

## 2023-06-19 LAB — PATHOLOGIST SMEAR REVIEW

## 2023-06-19 LAB — CBC
HCT: 25 % — ABNORMAL LOW (ref 39.0–52.0)
Hemoglobin: 8.1 g/dL — ABNORMAL LOW (ref 13.0–17.0)
MCH: 30.7 pg (ref 26.0–34.0)
MCHC: 32.4 g/dL (ref 30.0–36.0)
MCV: 94.7 fL (ref 80.0–100.0)
Platelets: 242 10*3/uL (ref 150–400)
RBC: 2.64 MIL/uL — ABNORMAL LOW (ref 4.22–5.81)
RDW: 19 % — ABNORMAL HIGH (ref 11.5–15.5)
WBC: 3.9 10*3/uL — ABNORMAL LOW (ref 4.0–10.5)
nRBC: 1 % — ABNORMAL HIGH (ref 0.0–0.2)

## 2023-06-19 NOTE — Plan of Care (Signed)
       CROSS COVER NOTE  NAME: Matthew Mcgrath MRN: [id number] DOB : 05-25-1945    Concern as stated by nurse / staff   So his HR is also irregular and no history of afib that I can see. He is not on cardiac monitoring either.      Pertinent findings on chart review:    06/19/2023    8:20 PM 06/19/2023    3:19 PM 06/19/2023    1:33 PM  Vitals with BMI  Systolic 127 118   Diastolic 90 79   Pulse 118 112 110     Assessment and  Interventions   Assessment:  Concern for irregular heartbeat and tachycardia  Plan: Telemetry monitoring EKG X

## 2023-06-19 NOTE — TOC Initial Note (Signed)
 Transition of Care [facility]) - Initial/Assessment Note    Patient Details  Name: Matthew Mcgrath MRN: [id number] Date of Birth: [birth year]  [facility] of [facility]) CM/SW Contact:    Margarito Liner, LCSW Phone Number: 06/19/2023, 3:49 PM  Clinical Narrative:  Per chart review, patient is a LTC resident at [facility]. CSW called admissions coordinator and confirmed. CSW will continue to follow patient for support and facilitate return to SNF once medically stable.  Expected Discharge Plan: Skilled Nursing Facility Barriers to Discharge: Continued Medical Work up   Patient Goals and CMS Choice            Expected Discharge Plan and Services     Post Acute Care Choice: Resumption of Svcs/PTA Provider Living arrangements for the past 2 months: Skilled Nursing Facility                                      Prior Living Arrangements/Services Living arrangements for the past 2 months: Skilled Nursing Facility Lives with:: Facility Resident Patient language and need for interpreter reviewed:: Yes        Need for Family Participation in Patient Care: Yes (Comment) Care giver support system in place?: Yes (comment)   Criminal Activity/Legal Involvement Pertinent to Current Situation/Hospitalization: No - Comment as needed  Activities of Daily Living      Permission Sought/Granted                  Emotional Assessment   Attitude/Demeanor/Rapport: Unable to Assess Affect (typically observed): Unable to Assess Orientation: : Oriented to Self Alcohol / Substance Use: Not Applicable Psych Involvement: No (comment)  Admission diagnosis:  Acute cystitis with hematuria [N30.01] Sepsis (HCC) [A41.9] Altered mental status, unspecified altered mental status type [R41.82] Sepsis, due to unspecified organism, unspecified whether acute organ dysfunction present [facility]) [A41.9] Patient Active Problem List   Diagnosis Date Noted   History of ESBL E. coli UTI  05/29/23 06/17/2023   Uncontrolled type 2 diabetes mellitus with hyperglycemia, with long-term current use of insulin (HCC) 06/17/2023   History of VTE (DVT and pulmonary embolism) 06/17/2023   Chronic anticoagulation 06/17/2023   Infection due to ESBL-producing Escherichia coli 06/01/2023   Alzheimer's dementia (HCC) 05/29/2023   Delirium 05/29/2023   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   DVT (deep venous thrombosis) (HCC) 04/08/2023   Acute metabolic encephalopathy 04/08/2023   Pulmonary embolism (HCC) 04/07/2023   Hypomagnesemia 04/07/2023   Elevated lactic acid level 04/07/2023   Myocardial injury 04/07/2023   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   Stroke (HCC) 11/26/2022   Acute encephalopathy 11/26/2022   Lower extremity weakness 06/28/2022   Cognitive impairment 05/17/2022   Anemia    Sepsis (HCC) 06/19/2021   Gross hematuria 06/19/2021   Complicated UTI (urinary tract infection) 06/18/2021   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   Genetic testing 02/22/2021   Family history of colon cancer 01/13/2021   Family history of breast cancer 01/13/2021   Encounter for antineoplastic chemotherapy 07/06/2020   Metastatic cancer to bone (HCC) 07/06/2020   Metastasis to bone (HCC) 05/28/2020   Prostate cancer (HCC) 05/11/2020   Goals of care, counseling/discussion 05/11/2020   Iliac artery aneurysm (HCC) 04/12/2016   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   Cardiomyopathy (HCC) 04/24/2015   S/P cardiac catheterization 04/24/2015   Chest pain 12/23/2014   Diabetes (HCC) 11/21/2014  Chronic systolic heart failure (HCC) 11/20/2014   Tobacco abuse 11/20/2014   Pedal edema 11/03/2014   Acute on chronic combined systolic and diastolic congestive heart failure (HCC) 09/20/2014   Elevation of level of transaminase and lactic acid dehydrogenase (LDH) 09/20/2014   Essential (primary) hypertension 05/02/2014    Hyperlipidemia 05/02/2014   Shortness of breath 05/02/2014   Urinary tract infection 05/02/2014   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   PCP:  [facility] Pharmacy:   [facility] [place], [place] - [address] [place] [address] Phone: [phone] Fax: [phone]     Social Drivers of Health (SDOH) Social History: SDOH Screenings   Food Insecurity: Patient Declined (06/17/2023)  Housing: Patient Declined (06/17/2023)  Transportation Needs: Patient Declined (06/17/2023)  Utilities: Patient Declined (06/17/2023)  Social Connections: Unknown (06/17/2023)  Tobacco Use: Medium Risk (06/16/2023)   SDOH Interventions:     Readmission Risk Interventions    05/30/2023    5:49 PM 06/21/2021   11:22 AM  Readmission Risk Prevention Plan  Transportation Screening Complete Complete  PCP or Specialist Appt within 5-7 Days  Complete  Home Care Screening  Complete  Medication Review (RN CM)  Complete  Medication Review (RN Care Manager) Complete   PCP or Specialist appointment within 3-5 days of discharge Complete   HRI or Home Care Consult Complete   SW Recovery Care/Counseling Consult Complete   Palliative Care Screening Not Applicable   Skilled Nursing Facility Not Applicable
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
## 2023-06-19 NOTE — Plan of Care (Addendum)
       CROSS COVER NOTE  NAME: Matthew Mcgrath MRN: [id number] DOB : 09-04-1945    Concern as stated by nurse / staff   Pt has been tachycardic today with new highest HR 118. VS: 127/90, 98.5 oral, 9% RA. Here for sepsis, saw HR normal yesterday, slowly increasing today. day RN said he was coughing with eating/drinking today. SLP was ordered, havent seen yet. Poss aspiration?      Pertinent findings on chart review: Per progress note today:  Sepsis (HCC), acute UTI immunocompromised state History of ESBL E. coli UTI on 05/29/2023 Blood culture showed Staph epidermidis in 1 out of 4 bottles.  Suspect this may be a contaminant.  Urine culture showed yeast. Continue IV vancomycin and fluconazole for now. Discontinue IV meropenem Follow-up repeat blood cultures from 06/18/2023.  No growth thus far.  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  -Possible microaspiration -coughing when eating -Low suspicion for VTE as etiology for tachycardia as patient is on Eliquis  Plan: Keep n.p.o. until evaluation by SLP Continue current management Aspiration precautions Continue close monitoring Consider dose of prednisone for possible pneumonitis if coughing is very bothersome

## 2023-06-19 NOTE — [place] FL2 (Signed)
  MEDICAID FL2 LEVEL OF CARE FORM     IDENTIFICATION  Patient Name: Matthew Mcgrath Birthdate: 1945-05-14 Sex: male Admission Date (Current Location): 06/16/2023  [place] and [place] Number:  Chiropodist and Address:  [facility], [address] [place], [place] [address]      Provider Number: [id number]  Attending Physician Name and Address:  Lurene Shadow, MD  Relative Name and Phone Number:       Current Level of Care: Hospital Recommended Level of Care: Skilled Nursing Facility Prior Approval Number:    Date Approved/Denied:   PASRR Number: [id number]  Discharge Plan: SNF    Current Diagnoses: Patient Active Problem List   Diagnosis Date Noted   History of ESBL E. coli UTI 05/29/23 06/17/2023   Uncontrolled type 2 diabetes mellitus with hyperglycemia, with long-term current use of insulin (HCC) 06/17/2023   History of VTE (DVT and pulmonary embolism) 06/17/2023   Chronic anticoagulation 06/17/2023   Infection due to ESBL-producing Escherichia coli 06/01/2023   Alzheimer's dementia (HCC) 05/29/2023   Delirium 05/29/2023   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   DVT (deep venous thrombosis) (HCC) 04/08/2023   Acute metabolic encephalopathy 04/08/2023   Pulmonary embolism (HCC) 04/07/2023   Hypomagnesemia 04/07/2023   Elevated lactic acid level 04/07/2023   Myocardial injury 04/07/2023   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   Stroke (HCC) 11/26/2022   Acute encephalopathy 11/26/2022   Lower extremity weakness 06/28/2022   Cognitive impairment 05/17/2022   Anemia    Sepsis (HCC) 06/19/2021   Gross hematuria 06/19/2021   Complicated UTI (urinary tract infection) 06/18/2021   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   Genetic testing 02/22/2021   Family history of colon cancer 01/13/2021   Family history of breast cancer 01/13/2021   Encounter for antineoplastic  chemotherapy 07/06/2020   Metastatic cancer to bone (HCC) 07/06/2020   Metastasis to bone (HCC) 05/28/2020   Prostate cancer (HCC) 05/11/2020   Goals of care, counseling/discussion 05/11/2020   Iliac artery aneurysm (HCC) 04/12/2016   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   Cardiomyopathy (HCC) 04/24/2015   S/P cardiac catheterization 04/24/2015   Chest pain 12/23/2014   Diabetes (HCC) 11/21/2014   Chronic systolic heart failure (HCC) 11/20/2014   Tobacco abuse 11/20/2014   Pedal edema 11/03/2014   Acute on chronic combined systolic and diastolic congestive heart failure (HCC) 09/20/2014   Elevation of level of transaminase and lactic acid dehydrogenase (LDH) 09/20/2014   Essential (primary) hypertension 05/02/2014   Hyperlipidemia 05/02/2014   Shortness of breath 05/02/2014   Urinary tract infection 05/02/2014   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    Orientation RESPIRATION BLADDER Height & Weight     Self  Normal Incontinent Weight: 142 lb 3.2 oz (64.5 kg) Height:  5\' 5"  (165.1 cm)  BEHAVIORAL SYMPTOMS/MOOD NEUROLOGICAL BOWEL NUTRITION STATUS   (None)  (Alzheimer's dementia) Incontinent Diet (Heart healthy/carb modified)  AMBULATORY STATUS COMMUNICATION OF NEEDS Skin     Verbally Skin abrasions, Other (Comment) (Unstageable pressure injuries on both heels (two on right): Foam daily. Deep tissue injury on right heel: Foam daily.)                       Personal Care Assistance Level of Assistance              Functional Limitations Info  Sight, Hearing, Speech Sight Info: Adequate Hearing Info: Adequate Speech Info:  Adequate    SPECIAL CARE FACTORS FREQUENCY                       Contractures Contractures Info: Not present    Additional Factors Info  Code Status, Allergies, Isolation Precautions Code Status Info: DNR Allergies Info: NKDA     Isolation Precautions Info: Contact: ESBL      Current Medications (06/19/2023):  This is the current hospital active medication list Current Facility-Administered Medications  Medication Dose Route Frequency Provider Last Rate Last Admin   acetaminophen (TYLENOL) tablet 650 mg  650 mg Oral Q6H PRN Duncan, Hazel V, MD       Or   acetaminophen (TYLENOL) suppository 650 mg  650 mg Rectal Q6H PRN Duncan, Hazel V, MD       apixaban Herby Lolling) tablet 5 mg  5 mg Oral BID Duncan, Hazel V, MD   5 mg at 06/19/23 0923   carvedilol (COREG) tablet 3.125 mg  3.125 mg Oral BID WC Duncan, Hazel V, MD   3.125 mg at 06/19/23 0923   cholecalciferol (VITAMIN D3) 25 MCG (1000 UNIT) tablet 1,000 Units  1,000 Units Oral Daily Lanetta Pion, MD   1,000 Units at 06/19/23 7846   fluconazole (DIFLUCAN) IVPB 200 mg  200 mg Intravenous Q24H Sheril Dines, MD 100 mL/hr at 06/19/23 1429 200 mg at 06/19/23 1429   gabapentin (NEURONTIN) capsule 200 mg  200 mg Oral BID Duncan, Hazel V, MD   200 mg at 06/19/23 9629   Glucerna liquid 237 mL  237 mL Oral BID Duncan, Hazel V, MD   237 mL at 06/19/23 0923   insulin aspart (novoLOG) injection 0-5 Units  0-5 Units Subcutaneous QHS Duncan, Hazel V, MD   2 Units at 06/17/23 0126   insulin aspart (novoLOG) injection 0-9 Units  0-9 Units Subcutaneous TID WC Duncan, Hazel V, MD   1 Units at 06/19/23 1211   insulin glargine-yfgn (SEMGLEE) injection 15 Units  15 Units Subcutaneous QHS Duncan, Hazel V, MD   15 Units at 06/18/23 2225   magnesium oxide (MAG-OX) tablet 400 mg  400 mg Oral Daily Duncan, Hazel V, MD   400 mg at 06/19/23 0923   meropenem (MERREM) 1 g in sodium chloride 0.9 % 100 mL IVPB  1 g Intravenous Q8H Sheril Dines, MD 200 mL/hr at 06/19/23 1328 1 g at 06/19/23 1328   morphine (PF) 2 MG/ML injection 2 mg  2 mg Intravenous Q2H PRN Duncan, Hazel V, MD       ondansetron (ZOFRAN) tablet 4 mg  4 mg Oral Q6H PRN Duncan, Hazel V, MD       Or   ondansetron (ZOFRAN) injection 4 mg  4 mg Intravenous Q6H PRN Duncan, Hazel V, MD        pantoprazole (PROTONIX) EC tablet 40 mg  40 mg Oral Daily Duncan, Hazel V, MD   40 mg at 06/19/23 0923   polyethylene glycol (MIRALAX / GLYCOLAX) packet 17 g  17 g Oral Daily Duncan, Hazel V, MD   17 g at 06/19/23 0923   Pro-Stat AWC LIQD 30 mL  30 mL Oral BID Duncan, Hazel V, MD   30 mL at 06/17/23 2144   tamsulosin (FLOMAX) capsule 0.4 mg  0.4 mg Oral QPM Duncan, Hazel V, MD   0.4 mg at 06/18/23 1744   vancomycin (VANCOREADY) IVPB 1250 mg/250 mL  1,250 mg Intravenous Q24H Duncan, Hazel V, MD   Stopped at 06/18/23 2357  Discharge Medications: Please see discharge summary for a list of discharge medications.  Relevant Imaging Results:  Relevant Lab Results:   Additional Information SS#: [id number]  Odilia Bennett, LCSW

## 2023-06-20 ENCOUNTER — Inpatient Hospital Stay: Payer: Medicare (Managed Care)

## 2023-06-20 DIAGNOSIS — G9341 Metabolic encephalopathy: Secondary | ICD-10-CM | POA: Diagnosis not present

## 2023-06-20 DIAGNOSIS — A419 Sepsis, unspecified organism: Secondary | ICD-10-CM | POA: Diagnosis not present

## 2023-06-20 DIAGNOSIS — R6520 Severe sepsis without septic shock: Secondary | ICD-10-CM | POA: Diagnosis not present

## 2023-06-20 LAB — GLUCOSE, CAPILLARY
Glucose-Capillary: 181 mg/dL — ABNORMAL HIGH (ref 70–99)
Glucose-Capillary: 150 mg/dL — ABNORMAL HIGH (ref 70–99)
Glucose-Capillary: 338 mg/dL — ABNORMAL HIGH (ref 70–99)
Glucose-Capillary: 260 mg/dL — ABNORMAL HIGH (ref 70–99)
Glucose-Capillary: 321 mg/dL — ABNORMAL HIGH (ref 70–99)

## 2023-06-20 LAB — CREATININE, SERUM
Creatinine, Ser: 0.71 mg/dL (ref 0.61–1.24)
GFR, Estimated: >60 mL/min (ref >60)

## 2023-06-20 MED ORDER — NITROGLYCERIN 0.4 MG SL SUBL
SUBLINGUAL_TABLET | SUBLINGUAL | Status: AC
  Administered 2023-06-20: 0.4 mg
  Filled 2023-06-20: qty 1

## 2023-06-20 MED ORDER — FLUCONAZOLE 100 MG PO TABS
100.0000 mg | ORAL_TABLET | Freq: Every day | ORAL | Status: DC
  Administered 2023-06-20 – 2023-06-22 (×3): 100 mg via ORAL
  Filled 2023-06-20 (×3): qty 1

## 2023-06-20 NOTE — Plan of Care (Signed)
  Problem: Education: Goal: Knowledge of General Education information will improve Description: Including pain rating scale, medication(s)/side effects and non-pharmacologic comfort measures Outcome: Progressing   Problem: Health Behavior/Discharge Planning: Goal: Ability to manage health-related needs will improve Outcome: Progressing   Problem: Nutrition: Goal: Adequate nutrition will be maintained Outcome: Progressing   Problem: Coping: Goal: Level of anxiety will decrease Outcome: Progressing   Problem: Activity: Goal: Risk for activity intolerance will decrease Outcome: Progressing   Problem: Elimination: Goal: Will not experience complications related to bowel motility Outcome: Progressing Goal: Will not experience complications related to urinary retention Outcome: Progressing   Problem: Pain Managment: Goal: General experience of comfort will improve and/or be controlled Outcome: Progressing   Problem: Safety: Goal: Ability to remain free from injury will improve Outcome: Progressing   Problem: Skin Integrity: Goal: Risk for impaired skin integrity will decrease Outcome: Progressing

## 2023-06-20 NOTE — Progress Notes (Signed)
 Pt coughing with eating and drinking, MD informed and pt NPO until SLP eval. Pt was able to swallow some PM meds with applesauce. Delayed cough. Pt has had increased RR and HR since shift change, Pt stated he felt SOB with 97% on RA. Pt placed on 2.5 LNC and felt less SOB overnight. O2 100% on 2.5L. ~ 0600 Pt then c/o Sob and CP and touched over his heart when asked where pain was. Pt has AICD. AT time of complaint pt was SR w 1AVB. Pt cont to c/o low persistent pain L chest. On telemetry pt had long run of ventricular trigeminy. EKG then performed, pt had converted back to SR. CXR ordered.

## 2023-06-20 NOTE — Progress Notes (Addendum)
 Progress Note    Matthew DUDENHOEFFER  ZOX:[id number] DOB: 1945/04/26  DOA: 06/16/2023 PCP: [facility]      Brief Narrative:    Medical records reviewed and are as summarized below:  Matthew Mcgrath is a 78 y.o. 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 coli UTI, being admitted from his facility with sepsis of unknown source after presenting with altered mental status described as decreased responsiveness as well as a fever of 102.   In the ED, temperature was 98.6 F, respiratory rate 28, heart rate 118, BP 133/84 and oxygen saturation 100% on room air.      Assessment/Plan:   Principal Problem:   Sepsis (HCC) Active Problems:   History of ESBL E. coli UTI 05/29/23   Chronic systolic heart failure (HCC)   ICD (implantable cardioverter-defibrillator) in place   Anemia   Uncontrolled type 2 diabetes mellitus with hyperglycemia, with long-term current use of insulin (HCC)   Acute metabolic encephalopathy   Essential (primary) hypertension   Prostate cancer (HCC)   Alzheimer's dementia (HCC)   Elevation of level of transaminase and lactic acid dehydrogenase (LDH)   Goals of care, counseling/discussion   History of VTE (DVT and pulmonary embolism)   Chronic anticoagulation    Body mass index is 23.66 kg/m.   Sepsis (HCC), acute UTI immunocompromised state History of ESBL E. coli UTI on 05/29/2023 Blood culture showed Staph epidermidis in 1 out of 4 bottles.  Suspect this may be a contaminant.  Repeat blood culture from 06/18/2023 did not show any growth.  Urine culture showed yeast. Discontinue IV vancomycin Change IV fluconazole to oral fluconazole  Previously treated with IV vancomycin, cefepime  and meropenem   Chronic systolic heart failure (HCC) Cardiomyopathy s/p AICD, EF 40 to 45% 04/2023 Euvolemic Continue carvedilol. Spironolactone and Lasix on hold.      Uncontrolled type 2 diabetes mellitus with hyperglycemia, with long-term current use of insulin (HCC) Continue Lantus and NovoLog as needed for hyperglycemia.    Acute on chronic anemia Hemoglobin up from 6.8-8.1. S/p transfusion with 1 unit of PRBCs on 06/17/2023. Monitor H&H. Of note recently transfused with packed red blood cells on 06/14/2023 when he visited the ED.    Acute metabolic encephalopathy on underlying dementia. Improved.  It appears he is back to baseline.    Prostate cancer (HCC) Elevated alk phos likely related to bony mets Followed by oncology On olaparib.  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  No acute behavioral issues at this time.   Dysphagia Patient has had coughing and choking spells while eating.  This is likely due to progression of dementia.  Speech therapist recommended dysphagia 2 diet.  Plan discussed with Olegario Messier, speech therapist, at the bedside.   No documented hypoxia.  He is on 3 L/min oxygen via [place].  Chest x-ray reported as "low volume film with vascular congestion".  Wean off oxygen as able.  Plan discussed with Karleen Hampshire, RN, at the bedside    His oncologist and PCP had recommended hospice.  However, patient's family is not ready for hospice at this time.  Diet Order             DIET DYS 2 Room service appropriate? No; Fluid consistency: Nectar Thick  Diet effective now                            Consultants: None  Procedures: None    Medications:    apixaban  5 mg Oral BID   carvedilol  3.125 mg Oral BID WC   cholecalciferol  1,000 Units Oral Daily   gabapentin  200 mg Oral BID   Glucerna  237 mL  Oral BID   insulin aspart  0-5 Units Subcutaneous QHS   insulin aspart  0-9 Units Subcutaneous TID WC   insulin glargine-yfgn  15 Units Subcutaneous QHS   magnesium oxide  400 mg Oral Daily   pantoprazole  40 mg Oral Daily   polyethylene glycol  17 g Oral Daily   Pro-Stat AWC  30 mL Oral BID   tamsulosin  0.4 mg Oral QPM   Continuous Infusions:  fluconazole (DIFLUCAN) IV Stopped (06/19/23 1529)     Anti-infectives (From admission, onward)    Start     Dose/Rate Route Frequency Ordered Stop   06/18/23 1400  fluconazole (DIFLUCAN) IVPB 200 mg        200 mg 100 mL/hr over 60 Minutes Intravenous Every 24 hours 06/18/23 1310     06/17/23 2100  vancomycin (VANCOREADY) IVPB 1250 mg/250 mL  Status:  Discontinued        1,250 mg 166.7 mL/hr over 90 Minutes Intravenous Every 24 hours 06/17/23 0156 06/20/23 1305   06/17/23 1200  meropenem (MERREM) 1 g in sodium chloride 0.9 % 100 mL IVPB  Status:  Discontinued        1 g 200 mL/hr over 30 Minutes Intravenous Every 8 hours 06/17/23 1040 06/19/23 1731   06/17/23 0900  metroNIDAZOLE (FLAGYL) IVPB 500 mg  Status:  Discontinued        500 mg 100 mL/hr over 60 Minutes Intravenous Every 12 hours 06/17/23 0040 06/17/23 1040   06/17/23 0700  ceFEPIme (MAXIPIME) 2 g in sodium chloride 0.9 % 100 mL IVPB  Status:  Discontinued        2 g 200 mL/hr over 30 Minutes Intravenous Every 12 hours 06/17/23 0049 06/17/23 1040   06/17/23 0145  vancomycin (VANCOREADY) IVPB 500 mg/100 mL        500 mg 100 mL/hr over 60 Minutes Intravenous  Once 06/17/23 0050 06/17/23 0233   06/16/23 1800  ceFEPIme (MAXIPIME) 2 g in sodium chloride 0.9 % 100 mL IVPB        2 g 200 mL/hr over 30 Minutes Intravenous  Once 06/16/23 1749 06/16/23 2046   06/16/23 1800  metroNIDAZOLE (FLAGYL) IVPB 500 mg        500 mg 100 mL/hr over 60 Minutes Intravenous  Once 06/16/23 1749 06/16/23 2208   06/16/23 1800  vancomycin (VANCOCIN) IVPB 1000 mg/200 mL premix        1,000 mg 200 mL/hr  over 60 Minutes Intravenous  Once 06/16/23 1749 06/16/23 2208              Family Communication/Anticipated D/C date and plan/Code Status   DVT prophylaxis:  apixaban (ELIQUIS) tablet 5 mg     Code Status: Limited: Do not attempt resuscitation (DNR) -DNR-LIMITED -Do Not Intubate/DNI   Family Communication: Plan discussed with Glenda, sister, at the bedside  Disposition Plan: Plan to  discharge to SNF   Status is: Inpatient Remains inpatient appropriate because: UTI       Subjective:   Interval events noted.  Nursing staff reports noted patient continued to have coughing episodes with eating and drinking.  He had complained of shortness of breath overnight and was noted to be tachycardic and tachypneic.  He was placed on oxygen via nasal cannula.  Currently, he feels better and denies any complaints.  No shortness of breath, cough or chest pain.  Glenda, sister was at the bedside.  Olegario Messier, speech therapist, was also at the bedside.  Objective:    Vitals:   06/20/23 0331 06/20/23 0537 06/20/23 0738 06/20/23 1133  BP: 120/69 109/71 101/64 120/75  Pulse: (!) 102 98 95 100  Resp: (!) 26  (!) 28 (!) 27  Temp: 98.5 F (36.9 C)  98.4 F (36.9 C) 98.7 F (37.1 C)  TempSrc: Oral     SpO2: 100% 100% 99% 100%  Weight:      Height:       No data found.   Intake/Output Summary (Last 24 hours) at 06/20/2023 1306 Last data filed at 06/20/2023 0511 Gross per 24 hour  Intake 450 ml  Output 350 ml  Net 100 ml   Filed Weights   06/17/23 0730  Weight: 64.5 kg    Exam:  GEN: NAD SKIN: Warm and dry EYES: No pallor or icterus ENT: MMM CV: RRR PULM: CTA B ABD: soft, ND, NT, +BS CNS: AAO x 1 (person), non focal EXT: No edema or tenderness     Data Reviewed:   I have personally reviewed following labs and imaging studies:  Labs: Labs show the following:   Basic Metabolic Panel: Recent Labs  Lab 06/14/23 1408 06/14/23 1801 06/16/23 1750 06/17/23 0542  06/20/23 0459  NA 131* 130* 132* 134*  --   K 4.2 4.3 4.3 4.1  --   CL 100 99 101 103  --   CO2 20* 22 19* 23  --   GLUCOSE 235* 209* 247* 188*  --   BUN 28* 29* 28* 18  --   CREATININE 0.96 0.97 1.03 0.78 0.71  CALCIUM 8.2* 8.5* 8.1* 8.1*  --    GFR Estimated Creatinine Clearance: 69.4 mL/min (by C-G formula based on SCr of 0.71 mg/dL). Liver Function Tests: Recent Labs  Lab 06/14/23 1408 06/14/23 1801 06/16/23 1750  AST 28 30 24   ALT 12 10 11   ALKPHOS 534* 504* 409*  BILITOT 0.5 0.5 0.6  PROT 6.7 6.9 6.5  ALBUMIN 2.7* 2.8* 2.6*   No results for input(s): "LIPASE", "AMYLASE" in the last 168 hours. No results for input(s): "AMMONIA" in the last 168 hours. Coagulation profile Recent Labs  Lab 06/16/23 1750  INR 1.4*    CBC: Recent Labs  Lab 06/14/23 1408 06/14/23 1801 06/16/23 1750 06/17/23 0542 06/17/23 1828 06/19/23 1530  WBC 4.3 4.4 4.6 4.0 4.6 3.9*  NEUTROABS 3.5 3.4 3.7  --   --   --   HGB 6.4* 6.5* 7.2* 6.8* 8.1* 8.1*  HCT 20.8* 21.4* 22.4* 21.9* 25.2* 25.0*  MCV 100.5* 99.5 97.4 98.2 94.4 94.7  PLT 282 290 302 232 231 242   Cardiac Enzymes: No results for input(s): "CKTOTAL", "CKMB", "CKMBINDEX", "TROPONINI" in the last 168 hours. BNP (last 3 results) No results for input(s): "PROBNP" in the last 8760 hours. CBG: Recent Labs  Lab 06/19/23 1710 06/19/23 2154 06/20/23 0150 06/20/23 0741 06/20/23 1139  GLUCAP 250* 229* 181* 150* 338*  D-Dimer: No results for input(s): "DDIMER" in the last 72 hours. Hgb A1c: No results for input(s): "HGBA1C" in the last 72 hours. Lipid Profile: No results for input(s): "CHOL", "HDL", "LDLCALC", "TRIG", "CHOLHDL", "LDLDIRECT" in the last 72 hours. Thyroid function studies: No results for input(s): "TSH", "T4TOTAL", "T3FREE", "THYROIDAB" in the last 72 hours.  Invalid input(s): "FREET3" Anemia work up: No results for input(s): "VITAMINB12", "FOLATE", "FERRITIN", "TIBC", "IRON", "RETICCTPCT" in the last 72  hours. Sepsis Labs: Recent Labs  Lab 06/16/23 1750 06/16/23 1822 06/16/23 2215 06/17/23 0542 06/17/23 1828 06/19/23 1530  WBC 4.6  --   --  4.0 4.6 3.9*  LATICACIDVEN  --  2.0* 2.5*  --   --   --     Microbiology Recent Results (from the past 240 hours)  Resp panel by RT-PCR (RSV, Flu A&B, Covid) Anterior Nasal Swab     Status: None   Collection Time: 06/16/23  5:50 PM   Specimen: Anterior Nasal Swab  Result Value Ref Range Status   SARS Coronavirus 2 by RT PCR NEGATIVE NEGATIVE Final    Comment: (NOTE) SARS-CoV-2 target nucleic acids are NOT DETECTED.  The SARS-CoV-2 RNA is generally detectable in upper respiratory specimens during the acute phase of infection. The lowest concentration of SARS-CoV-2 viral copies this assay can detect is 138 copies/mL. A negative result does not preclude SARS-Cov-2 infection and should not be used as the sole basis for treatment or other patient management decisions. A negative result may occur with  improper specimen collection/handling, submission of specimen other than nasopharyngeal swab, presence of viral mutation(s) within the areas targeted by this assay, and inadequate number of viral copies(<138 copies/mL). A negative result must be combined with clinical observations, patient history, and epidemiological information. The expected result is Negative.  Fact Sheet for Patients:  BloggerCourse.com  Fact Sheet for Healthcare Providers:  SeriousBroker.it  This test is no t yet approved or cleared by the [place]  FDA and  has been authorized for detection and/or diagnosis of SARS-CoV-2 by FDA under an Emergency Use Authorization (EUA). This EUA will remain  in effect (meaning this test can be used) for the duration of the COVID-19 declaration under Section 564(b)(1) of the Act, 21 U.S.C.section 360bbb-3(b)(1), unless the authorization is terminated  or revoked sooner.        Influenza A by PCR NEGATIVE NEGATIVE Final   Influenza B by PCR NEGATIVE NEGATIVE Final    Comment: (NOTE) The Xpert Xpress SARS-CoV-2/FLU/RSV plus assay is intended as an aid in the diagnosis of influenza from Nasopharyngeal swab specimens and should not be used as a sole basis for treatment. Nasal washings and aspirates are unacceptable for Xpert Xpress SARS-CoV-2/FLU/RSV testing.  Fact Sheet for Patients: BloggerCourse.com  Fact Sheet for Healthcare Providers: SeriousBroker.it  This test is not yet approved or cleared by the [place]  FDA and has been authorized for detection and/or diagnosis of SARS-CoV-2 by FDA under an Emergency Use Authorization (EUA). This EUA will remain in effect (meaning this test can be used) for the duration of the COVID-19 declaration under Section 564(b)(1) of the Act, 21 U.S.C. section 360bbb-3(b)(1), unless the authorization is terminated or revoked.     Resp Syncytial Virus by PCR NEGATIVE NEGATIVE Final    Comment: (NOTE) Fact Sheet for Patients: BloggerCourse.com  Fact Sheet for Healthcare Providers: SeriousBroker.it  This test is not yet approved or cleared by the [place]  FDA and has been authorized for detection and/or diagnosis of SARS-CoV-2 by FDA under  an Emergency Use Authorization (EUA). This EUA will remain in effect (meaning this test can be used) for the duration of the COVID-19 declaration under Section 564(b)(1) of the Act, 21 U.S.C. section 360bbb-3(b)(1), unless the authorization is terminated or revoked.  Performed at [facility], [address] [place], [place] [address]   Blood Culture (routine x 2)     Status: None (Preliminary result)   Collection Time: 06/16/23  6:05 PM   Specimen: BLOOD  Result Value Ref Range Status   Specimen Description BLOOD RIGHT ANTECUBITAL  Final   Special Requests    Final    BOTTLES DRAWN AEROBIC AND ANAEROBIC Blood Culture results may not be optimal due to an inadequate volume of blood received in culture bottles   Culture   Final    NO GROWTH 4 DAYS Performed at [facility], [address], [place], [place] [address]    Report Status PENDING  Incomplete  Blood Culture (routine x 2)     Status: Abnormal   Collection Time: 06/16/23  6:23 PM   Specimen: BLOOD  Result Value Ref Range Status   Specimen Description   Final    BLOOD BLOOD RIGHT HAND Performed at [facility], [address], [place], [place] [address]    Special Requests   Final    BOTTLES DRAWN AEROBIC AND ANAEROBIC Blood Culture adequate volume Performed at [facility], [address] [place], [place] [address]    Culture  Setup Time   Final    Organism ID to follow GRAM POSITIVE COCCI AEROBIC BOTTLE ONLY CRITICAL RESULT CALLED TO, READ BACK BY AND VERIFIED WITH: RAQUEL RODRIGUEZ GUZMAN ON 06/18/23 AT 1221 QSD Performed at [facility], [address] [place], [place] [address]    Culture (A)  Final    STAPHYLOCOCCUS EPIDERMIDIS THE SIGNIFICANCE OF ISOLATING THIS ORGANISM FROM A SINGLE SET OF BLOOD CULTURES WHEN MULTIPLE SETS ARE DRAWN IS UNCERTAIN. PLEASE NOTIFY THE MICROBIOLOGY DEPARTMENT WITHIN ONE WEEK IF SPECIATION AND SENSITIVITIES ARE REQUIRED. Performed at [facility], [address], [place], [place] [address]    Report Status 06/19/2023 FINAL  Final  Blood Culture ID Panel (Reflexed)     Status: Abnormal   Collection Time: 06/16/23  6:23 PM  Result Value Ref Range Status   Enterococcus faecalis NOT DETECTED NOT DETECTED Final   Enterococcus Faecium NOT DETECTED NOT DETECTED Final   Listeria monocytogenes NOT DETECTED NOT DETECTED Final   Staphylococcus species DETECTED (A) NOT DETECTED Final    Comment: CRITICAL RESULT CALLED TO, READ BACK BY AND VERIFIED WITH: RAQUEL RODRIGUEZ GUZMAN ON 06/18/23 AT 1221 QSD     Staphylococcus aureus (BCID) NOT DETECTED NOT DETECTED Final   Staphylococcus epidermidis DETECTED (A) NOT DETECTED Final    Comment: Methicillin (oxacillin) resistant coagulase negative staphylococcus. Possible blood culture contaminant (unless isolated from more than one blood culture draw or clinical case suggests pathogenicity). No antibiotic treatment is indicated for blood  culture contaminants. CRITICAL RESULT CALLED TO, READ BACK BY AND VERIFIED WITH: RAQUEL RODRIGUEZ GUZMAN ON 06/18/23 AT 1221 QSD    Staphylococcus lugdunensis NOT DETECTED NOT DETECTED Final   Streptococcus species NOT DETECTED NOT DETECTED Final   Streptococcus agalactiae NOT DETECTED NOT DETECTED Final   Streptococcus pneumoniae NOT DETECTED NOT DETECTED Final   Streptococcus pyogenes NOT DETECTED NOT DETECTED Final   A.calcoaceticus-baumannii NOT DETECTED NOT DETECTED Final   Bacteroides fragilis NOT DETECTED NOT DETECTED Final   Enterobacterales NOT  DETECTED NOT DETECTED Final   Enterobacter cloacae complex NOT DETECTED NOT DETECTED Final   Escherichia coli NOT DETECTED NOT DETECTED Final   Klebsiella aerogenes NOT DETECTED NOT DETECTED Final   Klebsiella oxytoca NOT DETECTED NOT DETECTED Final   Klebsiella pneumoniae NOT DETECTED NOT DETECTED Final   Proteus species NOT DETECTED NOT DETECTED Final   Salmonella species NOT DETECTED NOT DETECTED Final   Serratia marcescens NOT DETECTED NOT DETECTED Final   Haemophilus influenzae NOT DETECTED NOT DETECTED Final   Neisseria meningitidis NOT DETECTED NOT DETECTED Final   Pseudomonas aeruginosa NOT DETECTED NOT DETECTED Final   Stenotrophomonas maltophilia NOT DETECTED NOT DETECTED Final   Candida albicans NOT DETECTED NOT DETECTED Final   Candida auris NOT DETECTED NOT DETECTED Final   Candida glabrata NOT DETECTED NOT DETECTED Final   Candida krusei NOT DETECTED NOT DETECTED Final   Candida parapsilosis NOT DETECTED NOT DETECTED Final   Candida tropicalis NOT  DETECTED NOT DETECTED Final   Cryptococcus neoformans/gattii NOT DETECTED NOT DETECTED Final   Methicillin resistance mecA/C DETECTED (A) NOT DETECTED Final    Comment: CRITICAL RESULT CALLED TO, READ BACK BY AND VERIFIED WITH: RAQUEL RODRIGUEZ GUZMAN ON 06/18/23 AT 1221 QSD Performed at [facility], [address] [place], [place] [address]   Urine Culture     Status: Abnormal   Collection Time: 06/16/23  8:49 PM   Specimen: Urine, Random  Result Value Ref Range Status   Specimen Description   Final    URINE, RANDOM Performed at [facility], [address] [place], [place] [address]    Special Requests   Final    NONE Reflexed from [id number] Performed at [facility], [address] [place], [place] [address]    Culture 80,000 COLONIES/mL YEAST (A)  Final   Report Status 06/18/2023 FINAL  Final  Culture, blood (Routine X 2) w Reflex to ID Panel     Status: None (Preliminary result)   Collection Time: 06/18/23  1:11 PM   Specimen: BLOOD  Result Value Ref Range Status   Specimen Description BLOOD BLOOD RIGHT HAND  Final   Special Requests   Final    BOTTLES DRAWN AEROBIC AND ANAEROBIC Blood Culture adequate volume   Culture   Final    NO GROWTH 2 DAYS Performed at [facility], [address], [place], [place] [address]    Report Status PENDING  Incomplete  Culture, blood (Routine X 2) w Reflex to ID Panel     Status: None (Preliminary result)   Collection Time: 06/18/23  1:19 PM   Specimen: BLOOD  Result Value Ref Range Status   Specimen Description BLOOD BLOOD LEFT ARM  Final   Special Requests   Final    BOTTLES DRAWN AEROBIC AND ANAEROBIC Blood Culture adequate volume   Culture   Final    NO GROWTH 2 DAYS Performed at [facility], [address] [place], [place] [address]    Report Status PENDING  Incomplete    Procedures and diagnostic studies:  DG Chest Port 1 View Result Date: 06/20/2023 CLINICAL DATA:  Chest  pain EXAM: PORTABLE CHEST 1 VIEW COMPARISON:  06/16/2023 FINDINGS: Low volume film. Cardiopericardial silhouette is at upper limits of normal for size. Vascular congestion without overt edema. No focal consolidation or substantial pleural effusion. Left-sided single lead AICD again noted. Telemetry leads overlie the chest. IMPRESSION: Low volume film with vascular congestion. Electronically Signed   By: Zack Hero.D.  On: 06/20/2023 07:38                LOS: 3 days   Samar Dass  [facility]   Pager on [email or web address]. If 7PM-7AM, please contact night-coverage at [email or web address]     06/20/2023, 1:06 PM

## 2023-06-20 NOTE — Evaluation (Signed)
 Clinical/Bedside Swallow Evaluation Patient Details  Name: Matthew Mcgrath MRN: [id number] Date of Birth: [birth year]  Today's Date: 06/20/2023 Time: SLP Start Time (ACUTE ONLY): 0830 SLP Stop Time (ACUTE ONLY): 0930 SLP Time Calculation (min) (ACUTE ONLY): 60 min  Past Medical History:  Past Medical History:  Diagnosis Date   Cardiomyopathy (HCC)    Congestive heart failure (HCC)    Diabetes mellitus type 2, controlled (HCC)    Elevated lipids    Family history of breast cancer    Family history of colon cancer    HTN (hypertension)    Past Surgical History:  Past Surgical History:  Procedure Laterality Date   CARDIAC CATHETERIZATION N/A 04/14/2015   Procedure: Left Heart Cath and Coronary Angiography;  Surgeon: Percival Brace, MD;  Location: [facility] INVASIVE CV LAB;  Service: Cardiovascular;  Laterality: N/A;   CATARACT EXTRACTION W/ INTRAOCULAR LENS  IMPLANT, BILATERAL Left    COLONOSCOPY WITH PROPOFOL N/A 04/08/2020   Procedure: COLONOSCOPY WITH PROPOFOL;  Surgeon: Toledo, Alphonsus Jeans, MD;  Location: [facility] ENDOSCOPY;  Service: Gastroenterology;  Laterality: N/A;   CYSTOSCOPY W/ RETROGRADES Bilateral 07/02/2021   Procedure: CYSTOSCOPY WITH RETROGRADE PYELOGRAM;  Surgeon: Lawerence Pressman, MD;  Location: [facility] ORS;  Service: Urology;  Laterality: Bilateral;   HERNIA REPAIR     IMPLANTABLE CARDIOVERTER DEFIBRILLATOR (ICD) GENERATOR CHANGE Left 07/03/2015   Procedure: ICD IMPLANT single chamber;  Surgeon: Jerrie Morales, MD;  Location: [facility] ORS;  Service: Cardiovascular;  Laterality: Left;   TRANSURETHRAL RESECTION OF BLADDER TUMOR N/A 07/02/2021   Procedure: TRANSURETHRAL RESECTION OF BLADDER TUMOR (TURBT);  Surgeon: Lawerence Pressman, MD;  Location: [facility] ORS;  Service: Urology;  Laterality: N/A;   HPI:  Pt is a 78 y.o. male with medical history significant for Alzheimer's Dementia(advancing per MD), HTN, HLD, DM,HFrEF with recovered EF (last 40-45% 04/2023) s/p AICD, prostate cancer with  metastasis to the bone on Olarparnib, chronic anemia likely from anemia of chronic disease from underlying malignancy (metastatic prostate cancer), pulmonary embolism on Eliquis, recently hospitalized from 3/24 to 3/27 with severe sepsis secondary to ESBL E. coli UTI, being admitted from his facility with sepsis of unknown source after presenting with altered mental status described as decreased responsiveness as well as a fever of 102.  Pt is followed by the [facility] program at his facility. Pt has had Multiple admits since beginning of year per chart.   CXR Today: Low volume film with vascular congestion.    Assessment / Plan / Recommendation  Clinical Impression   Pt seen for BSE today. Pt lying in bed; eyes closed often. MOD verbal/tactile stim given at times to maintain pt's alertness. Minimally verbal. Family member present. MD arrived during session and endorsed pt's decline in functioning and initiation of Hospice services at D/C. Family member agreed.   On [place] O2 2.5L; afebrile.   Pt appears to present w/ oropharyngeal phase dysphagia w/ suspected sensorimotor deficits. Pt consumed po trials w/ subtle, overt clinical s/s of aspiration during the po trials and exhibited Cognitive decline in his behaviors and overall presentation.  Pt appears at risk for aspiration/aspiration pneumonia d/t his presentation and comorbidities. When following general aspiration precautions and using a modified diet consistency, this risk appears reduced. Pt does have challenging factors that could impact oropharyngeal swallowing to include advanced Dementia, deconditioning/weakness, acute/chronic illness, and need for feeding at meals. These factors can increase risk for aspiration, dysphagia as well as decreased oral intake overall.  Per NSG, pt has had intermittent coughing  spells w/ a regular diet consistency/thin liquids-- this could likely be d/t the progressive nature of his Dementia.   During po trials of modified  liquids(Nectar) and food, pt consumed consistencies w/ no overt coughing, decline in vocal quality, or change in respiratory presentation during/post trials. O2 sats 100% when checked. However, subtle delayed throat clearing noted w/ ice chips and Nectar trial 1x. Thin liquids were not given d/t this and d/t pt's Cognitive decline. Oral phase deficits were noted c/b lengthy bolus management and A-P transfer time; control of bolus propulsion/containment for A-P transfer for swallowing was [facility]. Mastication time/effort increased w/ increased textured food trials. Oral clearing achieved w/ all trial consistencies -- moistened, soft foods given to aid oral phase.  OM Exam was cursory d/t Cognitive engagement but appeared to reveal generalized weakness; No unilateral weakness. Speech min mumbled when he spoke. Pt could help hold Cup when drinking given support but required full feeding support otherwise.   Recommend a Dysphagia level 2 diet(MINCED Foods) w/ well-moistened foods; Nectar liquids -- carefully monitor straw use, and pt should help to Hold Cup when drinking. Recommend general aspiration precautions, reduce distractions during meals. Tray setup and sitting up for meals. Feeding assistance at all meals -- check for oral clearing b/t bites b/f feeding another bite. Pills CRUSHED in Puree for safer, easier swallowing -- encouraged now and for D/C to the Sister.   Education given on Pills in Puree; food consistencies and easy to eat options; general aspiration precautions; impact of Dementia on swallowing/oral intake to pt and Sister. No further Acute skilled ST services indicated. Recommend f/u w/ Palliative Care/Hospice services (as MD stated) for support at next venue of care. Facility/MD to consult ST services at next venue of care if any needs arise there; education needs for the Family. MD/NSG updated, agreed. MD agreed.  Precautions posted in room, chart.  SLP Visit Diagnosis: Dysphagia, oropharyngeal  phase (R13.12) (in setting of baseline Dementia; missing Dentition; requiring feeding assistance)    Aspiration Risk  Mild aspiration risk;Moderate aspiration risk;Risk for inadequate nutrition/hydration    Diet Recommendation   Nectar;Dysphagia 2 (chopped) (added purees) = a Dysphagia level 2 diet(MINCED Foods) w/ well-moistened foods; Nectar liquids -- carefully monitor straw use, and pt should help to Hold Cup when drinking. Recommend general aspiration precautions, reduce distractions during meals. Tray setup and sitting up for meals. Feeding assistance at all meals -- check for oral clearing b/t bites b/f feeding another bite.   Medication Administration: Crushed with puree    Other  Recommendations Recommended Consults:  (Palliative Care/Hospice services per MD; Jenne Moat) Oral Care Recommendations: Oral care BID;Oral care before and after PO;Staff/trained caregiver to provide oral care Caregiver Recommendations: Avoid jello, ice cream, thin soups, popsicles;Remove water pitcher;Have oral suction available    Recommendations for follow up therapy are one component of a multi-disciplinary discharge planning process, led by the attending physician.  Recommendations may be updated based on patient status, additional functional criteria and insurance authorization.  Follow up Recommendations Follow physician's recommendations for discharge plan and follow up therapies (at next venue of care)      Assistance Recommended at Discharge  Full d/t Dementia  Functional Status Assessment Patient has had a recent decline in their functional status and/or demonstrates limited ability to make significant improvements in function in a reasonable and predictable amount of time  Frequency and Duration  (n/a)   (n/a)       Prognosis Prognosis for improved oropharyngeal function: Guarded (-Fair) Barriers  to Reach Goals: Cognitive deficits;Language deficits;Time post onset;Severity of  deficits Barriers/Prognosis Comment: baseline Dementia; missing Dentition; requiring feeding assistance      Swallow Study   General Date of Onset: 06/17/23 HPI: Pt is a 78 y.o. male with medical history significant for Alzheimer's Dementia(advancing per MD), HTN, HLD, DM,HFrEF with recovered EF (last 40-45% 04/2023) s/p AICD, prostate cancer with metastasis to the bone on Olarparnib, chronic anemia likely from anemia of chronic disease from underlying malignancy (metastatic prostate cancer), pulmonary embolism on Eliquis, recently hospitalized from 3/24 to 3/27 with severe sepsis secondary to ESBL E. coli UTI, being admitted from his facility with sepsis of unknown source after presenting with altered mental status described as decreased responsiveness as well as a fever of 102.  Pt is followed by the [facility] program at his facility. Pt has had Multiple admits since beginning of year per chart.   CXR Today: Low volume film with vascular congestion. Type of Study: Bedside Swallow Evaluation Previous Swallow Assessment: none Diet Prior to this Study: NPO (had been on a regular diet at admit per MD) Temperature Spikes Noted: No (wbc 3.9) Respiratory Status: Nasal cannula (2.5-3L) History of Recent Intubation: No Behavior/Cognition: Cooperative;Pleasant mood;Confused;Alert;Lethargic/Drowsy;Distractible;Requires cueing Oral Cavity Assessment: Dry Oral Care Completed by SLP: Yes Oral Cavity - Dentition: Missing dentition Vision: Functional for self-feeding Self-Feeding Abilities: Able to feed self;Needs assist;Needs set up;Total assist (could hold cup) Patient Positioning: Upright in bed (MAX assist) Baseline Vocal Quality: Low vocal intensity Volitional Cough: Cognitively unable to elicit Volitional Swallow: Unable to elicit    Oral/Motor/Sensory Function Overall Oral Motor/Sensory Function: Generalized oral weakness (no unilateral weakness noted)   Ice Chips Ice chips: Impaired Presentation:  Spoon (fed; 3 trials) Oral Phase Impairments: Poor awareness of bolus (intermittently) Oral Phase Functional Implications: Prolonged oral transit Pharyngeal Phase Impairments: Suspected delayed Swallow;Throat Clearing - Delayed   Thin Liquid Thin Liquid: Not tested    Nectar Thick Nectar Thick Liquid: Impaired Presentation: Spoon;Cup;Straw;Self Fed (fully supported/fed: ~4 ozs total) Oral Phase Impairments: Poor awareness of bolus Oral phase functional implications: Prolonged oral transit (intermittently) Pharyngeal Phase Impairments: Throat Clearing - Delayed (x1)   Honey Thick Honey Thick Liquid: Not tested   Puree Puree: Impaired Presentation: Spoon (fed; ~4 ozs) Oral Phase Impairments: Poor awareness of bolus Oral Phase Functional Implications: Prolonged oral transit Pharyngeal Phase Impairments:  (no overt)   Solid     Solid: Impaired Presentation: Spoon (fed; 6 trials) Oral Phase Impairments: Impaired mastication;Poor awareness of bolus Oral Phase Functional Implications: Prolonged oral transit;Impaired mastication Pharyngeal Phase Impairments:  (no overt)        Matthew Edward, MS, CCC-SLP Speech Language Pathologist Rehab Services; [facility] [place] [phone] (ascom) Daved Mcfann 06/20/2023,4:46 PM

## 2023-06-21 DIAGNOSIS — G309 Alzheimer's disease, unspecified: Secondary | ICD-10-CM

## 2023-06-21 DIAGNOSIS — Z794 Long term (current) use of insulin: Secondary | ICD-10-CM

## 2023-06-21 DIAGNOSIS — F0280 Dementia in other diseases classified elsewhere without behavioral disturbance: Secondary | ICD-10-CM

## 2023-06-21 DIAGNOSIS — E1165 Type 2 diabetes mellitus with hyperglycemia: Secondary | ICD-10-CM | POA: Diagnosis not present

## 2023-06-21 DIAGNOSIS — Z8619 Personal history of other infectious and parasitic diseases: Secondary | ICD-10-CM

## 2023-06-21 DIAGNOSIS — A419 Sepsis, unspecified organism: Secondary | ICD-10-CM | POA: Diagnosis not present

## 2023-06-21 LAB — GLUCOSE, CAPILLARY
Glucose-Capillary: 190 mg/dL — ABNORMAL HIGH (ref 70–99)
Glucose-Capillary: 246 mg/dL — ABNORMAL HIGH (ref 70–99)
Glucose-Capillary: 283 mg/dL — ABNORMAL HIGH (ref 70–99)
Glucose-Capillary: 219 mg/dL — ABNORMAL HIGH (ref 70–99)

## 2023-06-21 LAB — CULTURE, BLOOD (ROUTINE X 2): Culture: NO GROWTH

## 2023-06-21 LAB — BRAIN NATRIURETIC PEPTIDE: B Natriuretic Peptide: 901.4 pg/mL — ABNORMAL HIGH (ref 0.0–100.0)

## 2023-06-21 MED ORDER — INSULIN ASPART 100 UNIT/ML IJ SOLN
4.0000 [IU] | Freq: Three times a day (TID) | INTRAMUSCULAR | Status: DC
  Administered 2023-06-21 – 2023-06-22 (×4): 4 [IU] via SUBCUTANEOUS
  Filled 2023-06-21 (×4): qty 1

## 2023-06-21 MED ORDER — FUROSEMIDE 10 MG/ML IJ SOLN
20.0000 mg | Freq: Three times a day (TID) | INTRAMUSCULAR | Status: AC
  Administered 2023-06-21 (×2): 20 mg via INTRAVENOUS
  Filled 2023-06-21 (×2): qty 4

## 2023-06-21 MED ORDER — PROSOURCE PLUS PO LIQD
30.0000 mL | Freq: Two times a day (BID) | ORAL | Status: DC
  Filled 2023-06-21 (×4): qty 30

## 2023-06-21 MED FILL — Nutritional Supplement Liquid: ORAL | Qty: 30 | Status: AC

## 2023-06-21 NOTE — Inpatient Diabetes Management (Signed)
 Inpatient Diabetes Program Recommendations  AACE/ADA: New Consensus Statement on Inpatient Glycemic Control (2015)  Target Ranges:  Prepandial:   less than 140 mg/dL      Peak postprandial:   less than 180 mg/dL (1-2 hours)      Critically ill patients:  140 - 180 mg/dL    Latest Reference Range & Units 06/20/23 07:41 06/20/23 11:39 06/20/23 18:03 06/20/23 21:57  Glucose-Capillary 70 - 99 mg/dL 409 (H)  1 units Novolog @0930  338 (H)  7 units Novolog  260 (H)  5 units Novolog  321 (H)  4 units Novolog  15 units Semglee   (H): Data is abnormally high    Home DM Meds: Lantus 22 units at HS       Novolog 4 units TID with meals for CBG >200       Metformin 1000 mg BID  Current Orders: Semglee 15 units at bedtime      Novolog Sensitive Correction Scale/ SSI (0-9 units) TID AC + HS    MD- Please consider starting Novolog Meal Coverage: Novolog 4 units TID with meals HOLD if pt NPO HOLD if pt eats <50% meals     --Will follow patient during hospitalization--  Langston Pippins RN, MSN, CDCES Diabetes Coordinator Inpatient Glycemic Control Team Team Pager: [phone] (8a-5p)

## 2023-06-21 NOTE — Plan of Care (Signed)

## 2023-06-21 NOTE — Progress Notes (Signed)
 Progress Note   Patient: Matthew Mcgrath XBJ:[id number] DOB: 1945/03/21 DOA: 06/16/2023     4 DOS: the patient was seen and examined on 06/21/2023   Brief hospital course: "Matthew Mcgrath is a 78 y.o. 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 coli UTI, being admitted from his facility with sepsis of unknown source after presenting with altered mental status described as decreased responsiveness as well as a fever of 102.    In the ED, temperature was 98.6 F, respiratory rate 28, heart rate 118, BP 133/84 and oxygen saturation 100% on room air."  Further hospital course and management as outlined below.   I assumed care 06/21/23. Note chest x-ray obtained yesterday showed vascular congestion. Note that patient has been quite tachypneic for past few days with RR in 20's to 30's.  Obtained BNP this AM, elevated above 900. Started IV Lasix for diuresis.    Assessment and Plan:  Sepsis (HCC), acute UTI immunocompromised state History of ESBL E. coli UTI on 05/29/2023 Blood culture showed Staph epidermidis in 1 out of 4 bottles.  Suspect this may be a contaminant.  Repeat blood culture from 06/18/2023 did not show any growth.   Urine culture showed yeast. Previously treated with IV vancomycin, cefepime and meropenem --Changed IV fluconazole to oral fluconazole -- continue to complete 7 days   Acute on Chronic systolic heart failure - was compensated, but has been tachypneic past few days, CXR yesterday showing vascular congestion, trace LE edema on exam.   Cardiomyopathy s/p AICD, EF 40 to 45% 04/2023 --IV Lasix 20 mg x 2 doses today --Monitor Io's and daily weights --Monitor renal function and electrolytes --Continue Coreg --Home PO  Lasix and spironolactone are held  Uncontrolled type 2 diabetes mellitus with hyperglycemia, with long-term current use of insulin (HCC) --Continue Lantus and sliding scale NovoLog     Acute on chronic anemia Recently transfused with pRBC's 06/14/2023 when in the ED. S/p transfusion with 1 unit of PRBCs on 06/17/2023 this admission Hemoglobin up from 6.8 >> 8.1. --Monitor Hbg --Transfuse pRBC's if Hbg < 7  Acute metabolic encephalopathy  Improved.  It appears he is back to baseline per family. Alzheimer's dementia No acute behavioral issues at this time. 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   --SLP recommended dysphagia 2 diet --Aspiration precautions    Goals of care Per chart review, Oncologist and PCP had recommended hospice.  However, patient's family is not ready for hospice at this time. --Code status DNR/DNI --Pt remains appropriate for hospice care when family are prepared to take that step. Recommending ongoing conversations in follow up.   Oxygen use without documented hypoxia Patient has been weaned from 3 L O2 (sats 97--100%) to room air. Patient is tachypneic, but not hypoxic.      Subjective: Pt seen with daughter at bedside today. Pt sleeping but wakes up to voice briefly.  He denies pain or feeling sick.  Feels short of breath.  No chest pain.  Physical Exam: Vitals:   06/20/23 1559 06/20/23 2038 06/21/23 0257 06/21/23 0843  BP: 133/86 122/73 119/74 116/71  Pulse: (!) 103 (!) 105 (!) 101 94  Resp: (!) 30 (!) 30 (!) 30   Temp: 98.3 F (36.8 C) 99 F (37.2 C) 99.4 F (37.4 C)   TempSrc:      SpO2: 99% 97% 98% 98%  Weight:      Height:        General exam: awake, alert, no acute distress HEENT: poor denition, moist mucus membranes, hearing grossly normal  Respiratory system: diminished breath sounds with shallow inspiratory volumes, no wheezes or rhonchi, normal respiratory effort. Cardiovascular system: normal S1/S2, RRR, trace BLE edema.   Gastrointestinal system: soft, NT, ND, no HSM felt, +bowel sounds. Central nervous system: no gross focal neurologic deficits, normal speech Extremities: moves all , no edema, normal tone Skin: dry, intact, normal temperature Psychiatry: normal mood, congruent affect   Data Reviewed:  Notable labs --  Hbg 8.1 BNP 901.4 CBG's labile 150 >. 338 >> 260 >> 321 >> 190 >> 246  Family Communication: at bedside on rounds  Disposition: Status is: Inpatient Remains inpatient appropriate because: on IV lasix for diuresis   Planned Discharge Destination: prior LTC facility     Time spent: 45 minutes  Author: Montey Apa, DO 06/21/2023 4:11 PM  For on call review [email or web address].

## 2023-06-21 NOTE — TOC Progression Note (Signed)
 Transition of Care [facility]) - Progression Note    Patient Details  Name: Matthew Mcgrath MRN: [id number] Date of Birth: Feb 15, 1946  [facility]) CM/SW Contact  Loman Risk, RN Phone Number: 06/21/2023, 2:35 PM  Clinical Narrative:    Per MD patient receiving IV lasix today Potentially medically ready for discharge back to [facility] tomorrow Robin from [facility] update    Expected Discharge Plan: Skilled Nursing Facility Barriers to Discharge: Continued Medical Work up  Expected Discharge Plan and Services     Post Acute Care Choice: Resumption of Svcs/PTA Provider Living arrangements for the past 2 months: Skilled Nursing Facility                                       Social Determinants of Health (SDOH) Interventions SDOH Screenings   Food Insecurity: Patient Declined (06/17/2023)  Housing: Patient Declined (06/17/2023)  Transportation Needs: Patient Declined (06/17/2023)  Utilities: Patient Declined (06/17/2023)  Social Connections: Unknown (06/17/2023)  Tobacco Use: Medium Risk (06/16/2023)    Readmission Risk Interventions    05/30/2023    5:49 PM 06/21/2021   11:22 AM  Readmission Risk Prevention Plan  Transportation Screening Complete Complete  PCP or Specialist Appt within 5-7 Days  Complete  Home Care Screening  Complete  Medication Review (RN CM)  Complete  Medication Review (RN Care Manager) Complete   PCP or Specialist appointment within 3-5 days of discharge Complete   HRI or Home Care Consult Complete   SW Recovery Care/Counseling Consult Complete   Palliative Care Screening Not Applicable   Skilled Nursing Facility Not Applicable

## 2023-06-22 DIAGNOSIS — G309 Alzheimer's disease, unspecified: Secondary | ICD-10-CM | POA: Diagnosis not present

## 2023-06-22 DIAGNOSIS — Z8619 Personal history of other infectious and parasitic diseases: Secondary | ICD-10-CM | POA: Diagnosis not present

## 2023-06-22 DIAGNOSIS — E1165 Type 2 diabetes mellitus with hyperglycemia: Secondary | ICD-10-CM | POA: Diagnosis not present

## 2023-06-22 DIAGNOSIS — Z86711 Personal history of pulmonary embolism: Secondary | ICD-10-CM

## 2023-06-22 LAB — BASIC METABOLIC PANEL WITH GFR
Anion gap: 9 (ref 5–15)
BUN: 19 mg/dL (ref 8–23)
CO2: 23 mmol/L (ref 22–32)
Calcium: 8.1 mg/dL — ABNORMAL LOW (ref 8.9–10.3)
Chloride: 103 mmol/L (ref 98–111)
Creatinine, Ser: 0.72 mg/dL (ref 0.61–1.24)
GFR, Estimated: >60 mL/min (ref >60)
Glucose, Bld: 178 mg/dL — ABNORMAL HIGH (ref 70–99)
Potassium: 4.1 mmol/L (ref 3.5–5.1)
Sodium: 135 mmol/L (ref 135–145)

## 2023-06-22 LAB — CBC
HCT: 23.6 % — ABNORMAL LOW (ref 39.0–52.0)
Hemoglobin: 7.6 g/dL — ABNORMAL LOW (ref 13.0–17.0)
MCH: 30.4 pg (ref 26.0–34.0)
MCHC: 32.2 g/dL (ref 30.0–36.0)
MCV: 94.4 fL (ref 80.0–100.0)
Platelets: 252 10*3/uL (ref 150–400)
RBC: 2.5 MIL/uL — ABNORMAL LOW (ref 4.22–5.81)
RDW: 18 % — ABNORMAL HIGH (ref 11.5–15.5)
WBC: 4.3 10*3/uL (ref 4.0–10.5)
nRBC: 1.2 % — ABNORMAL HIGH (ref 0.0–0.2)

## 2023-06-22 LAB — GLUCOSE, CAPILLARY
Glucose-Capillary: 180 mg/dL — ABNORMAL HIGH (ref 70–99)
Glucose-Capillary: 205 mg/dL — ABNORMAL HIGH (ref 70–99)

## 2023-06-22 LAB — MAGNESIUM: Magnesium: 1.9 mg/dL (ref 1.7–2.4)

## 2023-06-22 MED ORDER — LORAZEPAM 0.5 MG PO TABS: 0.2500 mg | ORAL_TABLET | Freq: Every day | ORAL | 0 refills | Status: DC

## 2023-06-22 MED ORDER — MORPHINE SULFATE (CONCENTRATE) 10 MG /0.5 ML PO SOLN: 4.0000 mg | ORAL | 0 refills | Status: DC | PRN

## 2023-06-22 MED ORDER — FLUCONAZOLE 100 MG PO TABS: 100.0000 mg | ORAL_TABLET | Freq: Every day | ORAL | Status: AC

## 2023-06-22 MED ORDER — APIXABAN 5 MG PO TABS: 5.0000 mg | ORAL_TABLET | Freq: Two times a day (BID) | ORAL | Status: DC

## 2023-06-22 MED ORDER — LORAZEPAM INTENSOL 2 MG/ML PO CONC: 0.5000 mg | ORAL | 0 refills | Status: DC | PRN

## 2023-06-22 MED ORDER — OXYCODONE HCL 5 MG PO TABS: 2.5000 mg | ORAL_TABLET | Freq: Three times a day (TID) | ORAL | 0 refills | Status: DC

## 2023-06-22 MED ORDER — OMEPRAZOLE 10 MG PO CPDR: 40.0000 mg | DELAYED_RELEASE_CAPSULE | Freq: Every day | ORAL | Status: DC

## 2023-06-22 MED ORDER — IRON SUCROSE 200 MG IVPB - SIMPLE MED
200.0000 mg | Freq: Once | Status: AC
  Administered 2023-06-22: 200 mg via INTRAVENOUS
  Filled 2023-06-22: qty 200

## 2023-06-22 NOTE — Discharge Instructions (Signed)
 Follow up with PCP and pick up medications from pharmacy. Make sure to take all antibiotics. Do not double up on narcotic/pain medication or drink alcohol during this time. Do not drive while taking narcotic medication. Call 911 or return to ER for life threatening issues or other concerns.

## 2023-06-22 NOTE — TOC Progression Note (Signed)
 Transition of Care [facility]) - Progression Note    Patient Details  Name: Matthew Mcgrath MRN: [id number] Date of Birth: 1945/07/02  [facility]) CM/SW Contact  Marino Sias, RN Phone Number: 06/22/2023, 11:00 AM  Clinical Narrative: Called [facility]an[facility] to make discharge arrangements for this afternoon per MD following Iron infusion. No answer left message to return call.    Expected Discharge Plan: Skilled Nursing Facility Barriers to Discharge: Continued Medical Work up  Expected Discharge Plan and Services     Post Acute Care Choice: Resumption of Svcs/PTA Provider Living arrangements for the past 2 months: Skilled Nursing Facility Expected Discharge Date: 06/22/23                                     Social Determinants of Health (SDOH) Interventions SDOH Screenings   Food Insecurity: Patient Declined (06/17/2023)  Housing: Patient Declined (06/17/2023)  Transportation Needs: Patient Declined (06/17/2023)  Utilities: Patient Declined (06/17/2023)  Social Connections: Unknown (06/17/2023)  Tobacco Use: Medium Risk (06/16/2023)    Readmission Risk Interventions    05/30/2023    5:49 PM 06/21/2021   11:22 AM  Readmission Risk Prevention Plan  Transportation Screening Complete Complete  PCP or Specialist Appt within 5-7 Days  Complete  Home Care Screening  Complete  Medication Review (RN CM)  Complete  Medication Review (RN Care Manager) Complete   PCP or Specialist appointment within 3-5 days of discharge Complete   HRI or Home Care Consult Complete   SW Recovery Care/Counseling Consult Complete   Palliative Care Screening Not Applicable   Skilled Nursing Facility Not Applicable

## 2023-06-22 NOTE — TOC Transition Note (Signed)
 Transition of Care [facility]) - Discharge Note   Patient Details  Name: Matthew Mcgrath MRN: [id number] Date of Birth: [birth year]  Transition of Care [facility]) CM/SW Contact:  Marino Sias, RN Phone Number: 06/22/2023, 12:17 PM   Clinical Narrative: Devonda Folds notified of discharge today, will arrange for transportation at 1pm. Nurse to call report to [facility] Nurse [phone]. Room E-8.      Final next level of care: Skilled Nursing Facility Barriers to Discharge: Barriers Resolved   Patient Goals and CMS Choice Patient states their goals for this hospitalization and ongoing recovery are:: To return to [facility] room E-8   Choice offered to / list presented to : NA      Discharge Placement                Patient to be transferred to facility by: [facility]   Patient and family notified of of transfer: 06/22/23  Discharge Plan and Services Additional resources added to the After Visit Summary for       Post Acute Care Choice: Resumption of Svcs/PTA Provider          DME Arranged: N/A DME Agency: NA       HH Arranged: NA HH Agency: NA        Social Drivers of Health (SDOH) Interventions SDOH Screenings   Food Insecurity: Patient Declined (06/17/2023)  Housing: Patient Declined (06/17/2023)  Transportation Needs: Patient Declined (06/17/2023)  Utilities: Patient Declined (06/17/2023)  Social Connections: Unknown (06/17/2023)  Tobacco Use: Medium Risk (06/16/2023)     Readmission Risk Interventions    05/30/2023    5:49 PM 06/21/2021   11:22 AM  Readmission Risk Prevention Plan  Transportation Screening Complete Complete  PCP or Specialist Appt within 5-7 Days  Complete  Home Care Screening  Complete  Medication Review (RN CM)  Complete  Medication Review (RN Care Manager) Complete   PCP or Specialist appointment within 3-5 days of discharge Complete   HRI or Home Care Consult Complete   SW Recovery Care/Counseling Consult Complete   Palliative Care Screening  Not Applicable   Skilled Nursing Facility Not Applicable

## 2023-06-22 NOTE — Progress Notes (Signed)
 Report was called to the number provided by Case Manager

## 2023-06-22 NOTE — Discharge Summary (Signed)
 Physician Discharge Summary   Patient: Matthew Mcgrath MRN: [id number] DOB: January 22, 1946  Admit date:     06/16/2023  Discharge date: 06/22/23  Discharge Physician: Pennie Banter   PCP: [name]   Recommendations at discharge:   Follow up with Primary Care within 1 week Repeat CBC within 1 week (Monday or Tuesday next week) and closely follow anemia Repeat CMP in 1 week and as needed  Hold PO iron supplement Low threshold to stop Eliquis if anemia is worsening or persistent Monitor closely for any signs of bleeding Take fluconazole for 5 more days for UTI  Discharge Diagnoses: Principal Problem:   Sepsis (HCC) Active Problems:   History of ESBL E. coli UTI 05/29/23   Chronic systolic heart failure (HCC)   ICD (implantable cardioverter-defibrillator) in place   Anemia   Uncontrolled type 2 diabetes mellitus with hyperglycemia, with long-term current use of insulin (HCC)   Acute metabolic encephalopathy   Essential (primary) hypertension   Prostate cancer (HCC)   Alzheimer's dementia (HCC)   Elevation of level of transaminase and lactic acid dehydrogenase (LDH)   Goals of care, counseling/discussion   History of VTE (DVT and pulmonary embolism)   Chronic anticoagulation  Resolved Problems:   * No resolved hospital problems. *  Hospital Course:  "Matthew Mcgrath is a 78 y.o. 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 coli UTI, being admitted from his facility with sepsis of unknown source after presenting with altered mental status described as decreased responsiveness as well as a fever of 102.    In the ED, temperature was 98.6 F, respiratory rate 28, heart rate 118, BP 133/84 and oxygen saturation  100% on room air."   Further hospital course and management as outlined below.     I assumed care 06/21/23. Note chest x-ray obtained yesterday showed vascular congestion. Note that patient has been quite tachypneic for past few days with RR in 20's to 30's.  Obtained BNP this AM, elevated above 900. Started IV Lasix for diuresis.   4/17 -- pt doing well this AM, respirations appear to have improved after getting IV Lasix yesterday.  Hbg failry stable from 8.1 >> 7.6 today.  No signs of any active bleeding.  IV iron infusion given this morning.  Patient is medically stable for discharge back to his facility today.   Assessment and Plan:  Sepsis (HCC), acute UTI immunocompromised state History of ESBL E. coli UTI on 05/29/2023 Blood culture showed Staph epidermidis in 1 out of 4 bottles.   Suspected contaminant.   Repeat blood culture from 06/18/2023 did not show any growth.   Initially treated with IV vancomycin, cefepime and meropenem Urine culture showed yeast.  Changed to IV fluconazole. --Continue oral fluconazole x 5 more days to complete 7 days   Acute on Chronic systolic heart failure - 4/16 was compensated, but has been tachypneic past few days, CXR 4/15 showing vascular congestion, trace LE edema on exam.   Cardiomyopathy s/p AICD, EF 40 to 45% 04/2023 --Treated with IV Lasix 20 mg x 2 doses on 4/16 and has improved --Resume PO Lasix --Monitor volume status closely, periodic weights, daily if feasible --Monitor renal function and electrolytes --Continue Coreg --Resume PO Lasix and spironolactone on d/c  Uncontrolled type 2 diabetes mellitus with hyperglycemia, with long-term current use of insulin (HCC) --Covered with Lantus and sliding scale NovoLog during admission --Resume outpatient regimen at d/c --Close PCP follow up   Acute on chronic anemia Iron deficiency anemia Recently transfused with pRBC's 06/14/2023 when in the ED. S/p transfusion with 1 unit of PRBCs on  06/17/2023 this admission Hemoglobin up from 6.8 >> 8.1 >> 7.6 today, stable. No signs of bleeding during admission. --IV iron infusion 200 mg Venofer given this AM --Hold off PO iron supplement to monitor stools for signs of GI bleeding --Low threshold to stop Elqiuis if any sign of bleeding --Monitor Hbg -- recommend repeat CBC on Monday or Tuesday next week, one week later to closely monitor --Transfuse pRBC's if Hbg < 7   Acute metabolic encephalopathy  Improved.  It appears he is back to baseline per family. Alzheimer's dementia No acute behavioral issues at this time. 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   --SLP recommended dysphagia 2 diet --Aspiration precautions    Goals of care Per chart review, Oncologist and PCP had recommended hospice.  However, patient's family is not ready for hospice at this time. --Code status DNR/DNI --Pt is on comfort meds with Primary Care / [facility], these will be continued on discharge --Pt remains appropriate for hospice care when family are prepared to take that step. Recommending ongoing conversations in follow up.     Oxygen use without documented hypoxia Patient has been weaned from 3 L O2 (sats 97--100%) to room air. Patient is tachypneic, but not hypoxic.       Consultants: None  Procedures performed: None    Disposition: Long term care facility  Diet recommendation:  Dysphagia type 2 Nectar thickened Liquid   DISCHARGE MEDICATION: Allergies as of 06/22/2023   No Known Allergies       Medication List     PAUSE taking these medications    ferrous sulfate 325 (65 FE) MG EC tablet Wait to take this until your doctor or other care provider tells you to start again. Take 1 tablet (325 mg total) by mouth daily.       STOP taking these medications    Ben Gay Ultra Strength 06-14-28 % Crea Generic drug: Camphor-Menthol-Methyl Sal   Jardiance 10 MG Tabs tablet Generic drug: empagliflozin   Lynparza 100 MG tablet Generic drug: olaparib   Lynparza 150 MG tablet Generic drug: olaparib       TAKE these medications    acetaminophen 325 MG tablet Commonly known as: TYLENOL Take 2 tablets (650 mg total) by mouth every 4 (four) hours as needed for mild pain (or temp > 37.5 C (99.5 F)). What changed:  how much to take when to take this   apixaban 5 MG Tabs tablet Commonly known as: ELIQUIS Take 1 tablet (5 mg total) by mouth 2 (two) times daily. What changed:  how much to take how to take this when to take this additional instructions   carvedilol 3.125 MG tablet Commonly known as: COREG Take 3.125 mg by mouth 2 (two) times  daily.   cholecalciferol 25 MCG (1000 UNIT) tablet Commonly known as: VITAMIN D3 Take 1,000 Units by mouth daily.   Desitin 13 % Crea Generic drug: Zinc Oxide Apply 1 application  topically in the morning, at noon, in the evening, and at bedtime. Apply liberally to diaper/gluteal area   Fish Oil 1000 MG Caps Take 1 capsule by mouth 2 (two) times daily.   fluconazole 100 MG tablet Commonly known as: DIFLUCAN Take 1 tablet (100 mg total) by mouth daily for 5 days.   furosemide 40 MG tablet Commonly known as: LASIX Take 40 mg by mouth every Monday, Tuesday, Wednesday, Thursday, and Friday.   gabapentin 100 MG capsule Commonly known as: NEURONTIN Take 200 mg by mouth 2 (two) times daily.   Glucerna Liqd Take 237 mLs by mouth 2 (two) times daily.   insulin glargine 100 UNIT/ML injection Commonly known as: LANTUS Inject  22 Units into the skin at bedtime. Notify [facility] on call or Dr. Arta Silence   leuprolide acetate (6 Month) 45 MG injection Generic drug: leuprolide (6 Month) Inject 45 mg into the skin every 6 (six) months. GIVEN AT [facility] EVERY 6 MONTHS   LORazepam Intensol 2 MG/ML concentrated solution Generic drug: LORazepam Take 0.3 mLs (0.6 mg total) by mouth every 4 (four) hours as needed for anxiety (and restlessness). CALL [facility] PRIOR TO ADMINISTERING What changed: how much to take   LORazepam 0.5 MG tablet Commonly known as: ATIVAN Take 0.5 tablets (0.25 mg total) by mouth at bedtime. What changed: Another medication with the same name was changed. Make sure you understand how and when to take each.   magnesium oxide 400 (240 Mg) MG tablet Commonly known as: MAG-OX Take 1 tablet by mouth daily.   metFORMIN 500 MG 24 hr tablet Commonly known as: GLUCOPHAGE-XR Take 1,000 mg by mouth 2 (two) times daily. What changed: Another medication with the same name was removed. Continue taking this medication, and follow the directions you see here.   MiraLax 17 GM/SCOOP powder Generic drug: polyethylene glycol powder Take 17 g by mouth daily. Mix in 8 ounces of water   morphine CONCENTRATE 10 mg / 0.5 ml concentrated solution Place 0.2 mLs (4 mg total) under the tongue every 2 (two) hours as needed (for air hunger or respiratory distress). CALL [facility] PRIOR TO ADMINISTERING   NovoLOG 100 UNIT/ML injection Generic drug: insulin aspart Inject 4 Units into the skin 3 (three) times daily as needed for high blood sugar (With meals PRN, Give if blood sugar is greater than 200).   omeprazole 10 MG capsule Commonly known as: PRILOSEC Take 4 capsules (40 mg total) by mouth daily. What changed: how much to take   One-Daily Multi-Vitamin Tabs Take 1 tablet by mouth daily.   oxyCODONE 5 MG immediate release tablet Commonly known as: Oxy IR/ROXICODONE Take 0.5 tablets (2.5 mg total) by mouth every 8 (eight) hours.  Administer 2.5 mg for pain not relieved by Tylenol or Gabapentin   Pro-Stat AWC Liqd Take 30 mLs by mouth 2 (two) times daily.   senna 8.6 MG Tabs tablet Commonly known as: SENOKOT Take 2 tablets by mouth every evening.   spironolactone 25 MG tablet Commonly known as: ALDACTONE Take 0.5 tablets (12.5 mg total) by mouth daily.   tamsulosin 0.4 MG Caps capsule Commonly known as: FLOMAX Take 1 capsule (0.4 mg total) by mouth daily. What changed: when to take this  Discharge Care Instructions  (From admission, onward)           Start     Ordered   06/22/23 0000  Discharge wound care:       Comments: Continue off-loading pressure areas. Reposition every 2 hours. Routine wound care. Close monitoring for any signs of infection.   06/22/23 1015            Discharge Exam: Filed Weights   06/17/23 0730  Weight: 64.5 kg   General exam: awake, alert, no acute distress, nonverbal HEENT: moist mucus membranes, hearing grossly normal  Respiratory system: CTAB, no wheezes, rales or rhonchi, normal respiratory effort. Cardiovascular system: normal S1/S2, RRR, no pedal edema.   Gastrointestinal system: soft, NT, ND Central nervous system: limited exam, non-verbal, no gross focal neurologic deficits, normal speech Extremities: moves all , no edema, normal tone Skin: dry, intact, normal temperature Psychiatry: normal mood, congruent affect, abnormal judgement and insight due to dementia   Condition at discharge: stable  The results of significant diagnostics from this hospitalization (including imaging, microbiology, ancillary and laboratory) are listed below for reference.   Imaging Studies: DG Chest Port 1 View Result Date: 06/20/2023 CLINICAL DATA:  Chest pain EXAM: PORTABLE CHEST 1 VIEW COMPARISON:  06/16/2023 FINDINGS: Low volume film. Cardiopericardial silhouette is at upper limits of normal for size. Vascular congestion without overt edema. No  focal consolidation or substantial pleural effusion. Left-sided single lead AICD again noted. Telemetry leads overlie the chest. IMPRESSION: Low volume film with vascular congestion. Electronically Signed   By: Donnal Fusi M.D.   On: 06/20/2023 07:38   DG Chest Port 1 View Result Date: 06/16/2023 CLINICAL DATA:  Altered mental status. EXAM: PORTABLE CHEST 1 VIEW COMPARISON:  Chest radiograph dated 06/14/2023. FINDINGS: Low lung volumes. Stable cardiomegaly. Stable left-sided pacemaker in place. Aortic atherosclerosis. No overt edema, focal consolidation, sizeable pleural effusion, or pneumothorax. No acute osseous abnormality. IMPRESSION: 1. Low lung volumes.  No acute cardiopulmonary findings. 2. Stable cardiomegaly. Electronically Signed   By: Mannie Seek M.D.   On: 06/16/2023 19:05   CT Head Wo Contrast Result Date: 06/16/2023 CLINICAL DATA:  Altered mental status. Personal history of Parkinson's disease. EXAM: CT HEAD WITHOUT CONTRAST TECHNIQUE: Contiguous axial images were obtained from the base of the skull through the vertex without intravenous contrast. RADIATION DOSE REDUCTION: This exam was performed according to the departmental dose-optimization program which includes automated exposure control, adjustment of the mA and/or kV according to patient size and/or use of iterative reconstruction technique. COMPARISON:  CT head without contrast three/twenty four/twenty five. FINDINGS: Brain: Advanced atrophy and confluent diffuse white matter hypoattenuation is similar the prior study. No acute infarct, hemorrhage, or mass lesion is present. Basal ganglia calcifications are again noted bilaterally. Deep gray nuclei are otherwise within normal limits. Insert proportionate ventricles no acute or focal cortical abnormality is present. No significant extraaxial fluid collection is present. Remote lacunar infarcts are again noted within the cerebellum bilaterally. The brainstem and cerebellum are  otherwise within normal limits. Midline structures are within normal limits. Vascular: Atherosclerotic calcifications are present within the cavernous internal carotid arteries bilaterally. No hyperdense vessel is present. Skull: Calvarium is intact. No focal lytic or blastic lesions are present. Sinuses/Orbits: The paranasal sinuses and mastoid air cells are clear. The globes and orbits are within normal limits. IMPRESSION: 1. Stable advanced atrophy and diffuse white matter disease. 2. No acute intracranial abnormality. 3. 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 Electronically Signed   By:  Marin Roberts M.D.   On: 06/16/2023 18:34   DG Chest Portable 1 View Result Date: 06/14/2023 CLINICAL DATA:  Shortness of breath. EXAM: PORTABLE CHEST 1 VIEW COMPARISON:  05/29/2023 FINDINGS: Stable cardiomegaly. Unchanged mediastinal contours. Aortic atherosclerosis. Left-sided pacemaker in place. No pulmonary edema. Subsegmental atelectasis at the left lung base. No pleural effusion or pneumothorax. No acute osseous findings. IMPRESSION: Stable cardiomegaly. Subsegmental atelectasis at the left lung base. No pulmonary edema. Electronically Signed   By: Narda Rutherford M.D.   On: 06/14/2023 20:37   CT HEAD WO CONTRAST ( ) Result Date: 05/29/2023 CLINICAL DATA:  altered, on eliquis, eval ICH EXAM: CT HEAD WITHOUT CONTRAST TECHNIQUE: Contiguous axial images were obtained from the base of the skull through the vertex without intravenous contrast. RADIATION DOSE REDUCTION: This exam was performed according to the departmental dose-optimization program which includes automated exposure control, adjustment of the mA and/or kV according to patient size and/or use of iterative reconstruction technique. COMPARISON:  CT head 04/07/2023 FINDINGS: Brain: Cerebral ventricle sizes are concordant with the degree of cerebral volume loss. Patchy and confluent areas of decreased attenuation are noted throughout the  deep and periventricular white matter of the cerebral hemispheres bilaterally, compatible with chronic microvascular ischemic disease. No evidence of large-territorial acute infarction. No parenchymal hemorrhage. No mass lesion. No extra-axial collection. No mass effect or midline shift. No hydrocephalus. Basilar cisterns are patent. Vascular: No hyperdense vessel. Atherosclerotic calcifications are present within the cavernous internal carotid and vertebral arteries. Skull: No acute fracture or focal lesion. Sinuses/Orbits: Paranasal sinuses and mastoid air cells are clear. The orbits are unremarkable. Other: None. IMPRESSION: No acute intracranial abnormality. Electronically Signed   By: Tish Frederickson M.D.   On: 05/29/2023 18:56   DG Chest Port 1 View Result Date: 05/29/2023 CLINICAL DATA:  Questionable sepsis EXAM: PORTABLE CHEST 1 VIEW COMPARISON:  Chest x-ray 04/07/2023 FINDINGS: Left-sided pacemaker is present. The heart is enlarged. There is no focal lung infiltrate, pleural effusion or pneumothorax. No acute fractures are seen. IMPRESSION: Cardiomegaly. No active disease. Electronically Signed   By: Darliss Cheney M.D.   On: 05/29/2023 15:59    Microbiology: Results for orders placed or performed during the hospital encounter of 06/16/23  Resp panel by RT-PCR (RSV, Flu A&B, Covid) Anterior Nasal Swab     Status: None   Collection Time: 06/16/23  5:50 PM   Specimen: Anterior Nasal Swab  Result Value Ref Range Status   SARS Coronavirus 2 by RT PCR NEGATIVE NEGATIVE Final    Comment: (NOTE) SARS-CoV-2 target nucleic acids are NOT DETECTED.  The SARS-CoV-2 RNA is generally detectable in upper respiratory specimens during the acute phase of infection. The lowest concentration of SARS-CoV-2 viral copies this assay can detect is 138 copies/mL. A negative result does not preclude SARS-Cov-2 infection and should not be used as the sole basis for treatment or other patient management decisions. A  negative result may occur with  improper specimen collection/handling, submission of specimen other than nasopharyngeal swab, presence of viral mutation(s) within the areas targeted by this assay, and inadequate number of viral copies(<138 copies/mL). A negative result must be combined with clinical observations, patient history, and epidemiological information. The expected result is Negative.  Fact Sheet for Patients:  BloggerCourse.com  Fact Sheet for Healthcare Providers:  SeriousBroker.it  This test is no t yet approved or cleared by the [place] FDA and  has been authorized for detection and/or diagnosis of SARS-CoV-2 by FDA under an Emergency Use Authorization (  EUA). This EUA will remain  in effect (meaning this test can be used) for the duration of the COVID-19 declaration under Section 564(b)(1) of the Act, 21 U.S.C.section 360bbb-3(b)(1), unless the authorization is terminated  or revoked sooner.       Influenza A by PCR NEGATIVE NEGATIVE Final   Influenza B by PCR NEGATIVE NEGATIVE Final    Comment: (NOTE) The Xpert Xpress SARS-CoV-2/FLU/RSV plus assay is intended as an aid in the diagnosis of influenza from Nasopharyngeal swab specimens and should not be used as a sole basis for treatment. Nasal washings and aspirates are unacceptable for Xpert Xpress SARS-CoV-2/FLU/RSV testing.  Fact Sheet for Patients: BloggerCourse.com  Fact Sheet for Healthcare Providers: SeriousBroker.it  This test is not yet approved or cleared by the [place] FDA and has been authorized for detection and/or diagnosis of SARS-CoV-2 by FDA under an Emergency Use Authorization (EUA). This EUA will remain in effect (meaning this test can be used) for the duration of the COVID-19 declaration under Section 564(b)(1) of the Act, 21 U.S.C. section 360bbb-3(b)(1), unless the authorization  is terminated or revoked.     Resp Syncytial Virus by PCR NEGATIVE NEGATIVE Final    Comment: (NOTE) Fact Sheet for Patients: BloggerCourse.com  Fact Sheet for Healthcare Providers: SeriousBroker.it  This test is not yet approved or cleared by the [place] FDA and has been authorized for detection and/or diagnosis of SARS-CoV-2 by FDA under an Emergency Use Authorization (EUA). This EUA will remain in effect (meaning this test can be used) for the duration of the COVID-19 declaration under Section 564(b)(1) of the Act, 21 U.S.C. section 360bbb-3(b)(1), unless the authorization is terminated or revoked.  Performed at [facility], [address] [place], [place] [address]   Blood Culture (routine x 2)     Status: None   Collection Time: 06/16/23  6:05 PM   Specimen: BLOOD  Result Value Ref Range Status   Specimen Description BLOOD RIGHT ANTECUBITAL  Final   Special Requests   Final    BOTTLES DRAWN AEROBIC AND ANAEROBIC Blood Culture results may not be optimal due to an inadequate volume of blood received in culture bottles   Culture   Final    NO GROWTH 5 DAYS Performed at [facility], [address], [place], [place] [address]    Report Status 06/21/2023 FINAL  Final  Blood Culture (routine x 2)     Status: Abnormal   Collection Time: 06/16/23  6:23 PM   Specimen: BLOOD  Result Value Ref Range Status   Specimen Description   Final    BLOOD BLOOD RIGHT HAND Performed at [facility], [address] [place], [place] [address]    Special Requests   Final    BOTTLES DRAWN AEROBIC AND ANAEROBIC Blood Culture adequate volume Performed at [facility], [address] [place], [place] [address]    Culture  Setup Time   Final    Organism ID to follow GRAM POSITIVE COCCI AEROBIC BOTTLE ONLY CRITICAL RESULT CALLED TO, READ BACK BY AND VERIFIED WITH: RAQUEL RODRIGUEZ GUZMAN ON  06/18/23 AT 1221 QSD Performed at [facility], [address] [place], [place] [address]    Culture (A)  Final    STAPHYLOCOCCUS EPIDERMIDIS THE SIGNIFICANCE OF ISOLATING THIS ORGANISM FROM A SINGLE SET OF BLOOD CULTURES WHEN MULTIPLE SETS ARE DRAWN IS UNCERTAIN. PLEASE NOTIFY THE MICROBIOLOGY DEPARTMENT WITHIN ONE WEEK IF SPECIATION AND SENSITIVITIES ARE REQUIRED. Performed at [facility], [address] [place], [place] [address]    Report Status 06/19/2023 FINAL  Final  Blood Culture ID Panel (Reflexed)     Status: Abnormal   Collection Time: 06/16/23  6:23 PM  Result Value Ref Range Status   Enterococcus faecalis NOT DETECTED NOT DETECTED Final   Enterococcus Faecium NOT DETECTED NOT DETECTED Final   Listeria monocytogenes NOT DETECTED NOT DETECTED Final   Staphylococcus species DETECTED (A) NOT DETECTED Final    Comment: CRITICAL RESULT CALLED TO, READ BACK BY AND VERIFIED WITH: RAQUEL RODRIGUEZ GUZMAN ON 06/18/23 AT 1221 QSD    Staphylococcus aureus (BCID) NOT DETECTED NOT DETECTED Final   Staphylococcus epidermidis DETECTED (A) NOT DETECTED Final    Comment: Methicillin (oxacillin) resistant coagulase negative staphylococcus. Possible blood culture contaminant (unless isolated from more than one blood culture draw or clinical case suggests pathogenicity). No antibiotic treatment is indicated for blood  culture contaminants. CRITICAL RESULT CALLED TO, READ BACK BY AND VERIFIED WITH: RAQUEL RODRIGUEZ GUZMAN ON 06/18/23 AT 1221 QSD    Staphylococcus lugdunensis NOT DETECTED NOT DETECTED Final   Streptococcus species NOT DETECTED NOT DETECTED Final   Streptococcus agalactiae NOT DETECTED NOT DETECTED Final   Streptococcus pneumoniae NOT DETECTED NOT DETECTED Final   Streptococcus pyogenes NOT DETECTED NOT DETECTED Final   A.calcoaceticus-baumannii NOT DETECTED NOT DETECTED Final   Bacteroides fragilis NOT DETECTED NOT DETECTED Final   Enterobacterales NOT DETECTED  NOT DETECTED Final   Enterobacter cloacae complex NOT DETECTED NOT DETECTED Final   Escherichia coli NOT DETECTED NOT DETECTED Final   Klebsiella aerogenes NOT DETECTED NOT DETECTED Final   Klebsiella oxytoca NOT DETECTED NOT DETECTED Final   Klebsiella pneumoniae NOT DETECTED NOT DETECTED Final   Proteus species NOT DETECTED NOT DETECTED Final   Salmonella species NOT DETECTED NOT DETECTED Final   Serratia marcescens NOT DETECTED NOT DETECTED Final   Haemophilus influenzae NOT DETECTED NOT DETECTED Final   Neisseria meningitidis NOT DETECTED NOT DETECTED Final   Pseudomonas aeruginosa NOT DETECTED NOT DETECTED Final   Stenotrophomonas maltophilia NOT DETECTED NOT DETECTED Final   Candida albicans NOT DETECTED NOT DETECTED Final   Candida auris NOT DETECTED NOT DETECTED Final   Candida glabrata NOT DETECTED NOT DETECTED Final   Candida krusei NOT DETECTED NOT DETECTED Final   Candida parapsilosis NOT DETECTED NOT DETECTED Final   Candida tropicalis NOT DETECTED NOT DETECTED Final   Cryptococcus neoformans/gattii NOT DETECTED NOT DETECTED Final   Methicillin resistance mecA/C DETECTED (A) NOT DETECTED Final    Comment: CRITICAL RESULT CALLED TO, READ BACK BY AND VERIFIED WITH: RAQUEL RODRIGUEZ GUZMAN ON 06/18/23 AT 1221 QSD Performed at [facility], [address], [place], [place] [address]   Urine Culture     Status: Abnormal   Collection Time: 06/16/23  8:49 PM   Specimen: Urine, Random  Result Value Ref Range Status   Specimen Description   Final    URINE, RANDOM Performed at [facility], [address] [place], [place] [address]    Special Requests   Final    NONE Reflexed from [phone] Performed at [facility], [address] [place], [place] [address]    Culture 80,000 COLONIES/mL YEAST (A)  Final   Report Status 06/18/2023 FINAL  Final  Culture, blood (Routine X 2) w Reflex to ID Panel     Status: None (Preliminary result)   Collection  Time: 06/18/23  1:11 PM   Specimen: BLOOD  Result Value Ref Range  Status   Specimen Description BLOOD BLOOD RIGHT HAND  Final   Special Requests   Final    BOTTLES DRAWN AEROBIC AND ANAEROBIC Blood Culture adequate volume   Culture   Final    NO GROWTH 4 DAYS Performed at [facility], [address] [place], [place] [address]    Report Status PENDING  Incomplete  Culture, blood (Routine X 2) w Reflex to ID Panel     Status: None (Preliminary result)   Collection Time: 06/18/23  1:19 PM   Specimen: BLOOD  Result Value Ref Range Status   Specimen Description BLOOD BLOOD LEFT ARM  Final   Special Requests   Final    BOTTLES DRAWN AEROBIC AND ANAEROBIC Blood Culture adequate volume   Culture   Final    NO GROWTH 4 DAYS Performed at [facility], [address] [place], [place] [address]    Report Status PENDING  Incomplete    Labs: CBC: Recent Labs  Lab 06/16/23 1750 06/17/23 0542 06/17/23 1828 06/19/23 1530 06/22/23 0442  WBC 4.6 4.0 4.6 3.9* 4.3  NEUTROABS 3.7  --   --   --   --   HGB 7.2* 6.8* 8.1* 8.1* 7.6*  HCT 22.4* 21.9* 25.2* 25.0* 23.6*  MCV 97.4 98.2 94.4 94.7 94.4  PLT 302 232 231 242 252   Basic Metabolic Panel: Recent Labs  Lab 06/16/23 1750 06/17/23 0542 06/20/23 0459 06/22/23 0442  NA 132* 134*  --  135  K 4.3 4.1  --  4.1  CL 101 103  --  103  CO2 19* 23  --  23  GLUCOSE 247* 188*  --  178*  BUN 28* 18  --  19  CREATININE 1.03 0.78 0.71 0.72  CALCIUM 8.1* 8.1*  --  8.1*  MG  --   --   --  1.9   Liver Function Tests: Recent Labs  Lab 06/16/23 1750  AST 24  ALT 11  ALKPHOS 409*  BILITOT 0.6  PROT 6.5  ALBUMIN 2.6*   CBG: Recent Labs  Lab 06/21/23 0926 06/21/23 1131 06/21/23 1632 06/21/23 2211 06/22/23 0850  GLUCAP 190* 246* 283* 219* 180*    Discharge time spent: greater than 30 minutes.  Signed: Montey Apa, DO [facility] 06/22/2023

## 2023-06-23 LAB — CULTURE, BLOOD (ROUTINE X 2)
Culture: NO GROWTH
Culture: NO GROWTH
Special Requests: ADEQUATE
Special Requests: ADEQUATE

## 2023-08-06 DEATH — deceased

## 2023-08-23 ENCOUNTER — Encounter: Payer: Self-pay | Admitting: Oncology

## 2023-11-09 ENCOUNTER — Other Ambulatory Visit: Payer: Self-pay | Admitting: Urology

## 2024-01-08 ENCOUNTER — Ambulatory Visit (INDEPENDENT_AMBULATORY_CARE_PROVIDER_SITE_OTHER): Payer: Medicare (Managed Care) | Admitting: Nurse Practitioner

## 2024-01-08 ENCOUNTER — Encounter (INDEPENDENT_AMBULATORY_CARE_PROVIDER_SITE_OTHER): Payer: Medicare (Managed Care)
